# Patient Record
Sex: Male | Born: 1935 | Race: Black or African American | Hispanic: No | Smoking: Former smoker
Health system: Southern US, Community
[De-identification: ages and names within clinical notes are randomized; demographics above are authoritative.]

## PROBLEM LIST (undated history)

## (undated) DIAGNOSIS — E119 Type 2 diabetes mellitus without complications: Secondary | ICD-10-CM

## (undated) DIAGNOSIS — K7689 Other specified diseases of liver: Secondary | ICD-10-CM

## (undated) DIAGNOSIS — Z86718 Personal history of other venous thrombosis and embolism: Secondary | ICD-10-CM

## (undated) DIAGNOSIS — N184 Chronic kidney disease, stage 4 (severe): Secondary | ICD-10-CM

## (undated) DIAGNOSIS — E785 Hyperlipidemia, unspecified: Secondary | ICD-10-CM

## (undated) DIAGNOSIS — K5790 Diverticulosis of intestine, part unspecified, without perforation or abscess without bleeding: Secondary | ICD-10-CM

## (undated) DIAGNOSIS — N2889 Other specified disorders of kidney and ureter: Secondary | ICD-10-CM

## (undated) DIAGNOSIS — I81 Portal vein thrombosis: Secondary | ICD-10-CM

## (undated) DIAGNOSIS — M1990 Unspecified osteoarthritis, unspecified site: Secondary | ICD-10-CM

## (undated) DIAGNOSIS — D649 Anemia, unspecified: Secondary | ICD-10-CM

## (undated) DIAGNOSIS — H9190 Unspecified hearing loss, unspecified ear: Secondary | ICD-10-CM

## (undated) DIAGNOSIS — E039 Hypothyroidism, unspecified: Secondary | ICD-10-CM

## (undated) DIAGNOSIS — Z8711 Personal history of peptic ulcer disease: Secondary | ICD-10-CM

## (undated) DIAGNOSIS — M19019 Primary osteoarthritis, unspecified shoulder: Secondary | ICD-10-CM

## (undated) DIAGNOSIS — I10 Essential (primary) hypertension: Secondary | ICD-10-CM

## (undated) DIAGNOSIS — K5730 Diverticulosis of large intestine without perforation or abscess without bleeding: Secondary | ICD-10-CM

## (undated) HISTORY — DX: Hyperlipidemia, unspecified: E78.5

## (undated) HISTORY — DX: Portal vein thrombosis: I81

## (undated) HISTORY — DX: Other specified disorders of kidney and ureter: N28.89

## (undated) HISTORY — PX: HERNIA REPAIR: SHX51

## (undated) HISTORY — DX: Diverticulosis of intestine, part unspecified, without perforation or abscess without bleeding: K57.90

## (undated) HISTORY — PX: LIVER LOBECTOMY: SHX1976

## (undated) HISTORY — DX: Primary osteoarthritis, unspecified shoulder: M19.019

## (undated) HISTORY — PX: SHOULDER ARTHROCENTESIS: SHX1048

## (undated) HISTORY — DX: Personal history of other venous thrombosis and embolism: Z86.718

## (undated) HISTORY — DX: Essential (primary) hypertension: I10

## (undated) HISTORY — PX: COLON SURGERY: SHX602

## (undated) HISTORY — DX: Personal history of peptic ulcer disease: Z87.11

## (undated) HISTORY — DX: Diverticulosis of large intestine without perforation or abscess without bleeding: K57.30

## (undated) HISTORY — DX: Chronic kidney disease, stage 4 (severe): N18.4

## (undated) HISTORY — DX: Unspecified hearing loss, unspecified ear: H91.90

## (undated) HISTORY — DX: Other specified diseases of liver: K76.89

---

## 2009-03-08 ENCOUNTER — Encounter: Payer: Self-pay | Admitting: Family Medicine

## 2009-07-27 ENCOUNTER — Emergency Department (HOSPITAL_COMMUNITY): Admission: EM | Admit: 2009-07-27 | Discharge: 2009-07-27 | Payer: Self-pay | Admitting: Emergency Medicine

## 2009-08-02 ENCOUNTER — Emergency Department (HOSPITAL_COMMUNITY): Admission: EM | Admit: 2009-08-02 | Discharge: 2009-08-02 | Payer: Self-pay | Admitting: Emergency Medicine

## 2010-02-01 ENCOUNTER — Ambulatory Visit: Payer: Self-pay | Admitting: Family Medicine

## 2010-02-01 ENCOUNTER — Encounter: Payer: Self-pay | Admitting: Family Medicine

## 2010-02-01 DIAGNOSIS — I10 Essential (primary) hypertension: Secondary | ICD-10-CM

## 2010-02-01 DIAGNOSIS — N184 Chronic kidney disease, stage 4 (severe): Secondary | ICD-10-CM

## 2010-02-01 DIAGNOSIS — D631 Anemia in chronic kidney disease: Secondary | ICD-10-CM

## 2010-02-01 DIAGNOSIS — E1143 Type 2 diabetes mellitus with diabetic autonomic (poly)neuropathy: Secondary | ICD-10-CM

## 2010-02-01 DIAGNOSIS — N189 Chronic kidney disease, unspecified: Secondary | ICD-10-CM

## 2010-02-01 DIAGNOSIS — M19019 Primary osteoarthritis, unspecified shoulder: Secondary | ICD-10-CM

## 2010-02-01 HISTORY — DX: Primary osteoarthritis, unspecified shoulder: M19.019

## 2010-02-01 LAB — CONVERTED CEMR LAB
ALT: 29 units/L (ref 0–53)
AST: 35 units/L (ref 0–37)
Albumin: 3.9 g/dL (ref 3.5–5.2)
Alkaline Phosphatase: 71 units/L (ref 39–117)
BUN: 67 mg/dL — ABNORMAL HIGH (ref 6–23)
CO2: 27 meq/L (ref 19–32)
Calcium: 9 mg/dL (ref 8.4–10.5)
Chloride: 104 meq/L (ref 96–112)
Creatinine, Ser: 1.86 mg/dL — ABNORMAL HIGH (ref 0.40–1.50)
Glucose, Bld: 104 mg/dL — ABNORMAL HIGH (ref 70–99)
HCT: 33.8 % — ABNORMAL LOW (ref 39.0–52.0)
Hemoglobin: 10.1 g/dL — ABNORMAL LOW (ref 13.0–17.0)
MCHC: 29.9 g/dL — ABNORMAL LOW (ref 30.0–36.0)
MCV: 81.6 fL (ref 78.0–100.0)
Platelets: 199 10*3/uL (ref 150–400)
Potassium: 4.7 meq/L (ref 3.5–5.3)
RBC: 4.14 M/uL — ABNORMAL LOW (ref 4.22–5.81)
RDW: 14.9 % (ref 11.5–15.5)
Sodium: 140 meq/L (ref 135–145)
Total Bilirubin: 0.3 mg/dL (ref 0.3–1.2)
Total Protein: 7.5 g/dL (ref 6.0–8.3)
WBC: 5.8 10*3/uL (ref 4.0–10.5)

## 2010-02-05 ENCOUNTER — Telehealth: Payer: Self-pay | Admitting: Family Medicine

## 2010-02-13 ENCOUNTER — Telehealth: Payer: Self-pay | Admitting: Family Medicine

## 2010-02-19 ENCOUNTER — Encounter: Payer: Self-pay | Admitting: Family Medicine

## 2010-02-20 ENCOUNTER — Telehealth: Payer: Self-pay | Admitting: Family Medicine

## 2010-02-21 ENCOUNTER — Telehealth: Payer: Self-pay | Admitting: Family Medicine

## 2010-02-28 ENCOUNTER — Encounter: Admission: RE | Admit: 2010-02-28 | Discharge: 2010-04-05 | Payer: Self-pay | Admitting: Family Medicine

## 2010-03-06 ENCOUNTER — Ambulatory Visit: Payer: Self-pay | Admitting: Family Medicine

## 2010-03-06 ENCOUNTER — Telehealth: Payer: Self-pay | Admitting: Family Medicine

## 2010-03-08 ENCOUNTER — Encounter: Payer: Self-pay | Admitting: Family Medicine

## 2010-03-08 DIAGNOSIS — K208 Other esophagitis: Secondary | ICD-10-CM

## 2010-03-08 DIAGNOSIS — K5730 Diverticulosis of large intestine without perforation or abscess without bleeding: Secondary | ICD-10-CM

## 2010-03-08 DIAGNOSIS — Z8672 Personal history of thrombophlebitis: Secondary | ICD-10-CM

## 2010-03-08 DIAGNOSIS — N289 Disorder of kidney and ureter, unspecified: Secondary | ICD-10-CM | POA: Insufficient documentation

## 2010-03-08 DIAGNOSIS — J984 Other disorders of lung: Secondary | ICD-10-CM

## 2010-03-08 DIAGNOSIS — D376 Neoplasm of uncertain behavior of liver, gallbladder and bile ducts: Secondary | ICD-10-CM

## 2010-03-08 DIAGNOSIS — Z8711 Personal history of peptic ulcer disease: Secondary | ICD-10-CM

## 2010-03-08 HISTORY — DX: Personal history of peptic ulcer disease: Z87.11

## 2010-03-08 HISTORY — DX: Diverticulosis of large intestine without perforation or abscess without bleeding: K57.30

## 2010-03-13 ENCOUNTER — Encounter: Payer: Self-pay | Admitting: *Deleted

## 2010-03-20 ENCOUNTER — Telehealth: Payer: Self-pay | Admitting: Family Medicine

## 2010-03-28 ENCOUNTER — Ambulatory Visit (HOSPITAL_COMMUNITY): Admission: RE | Admit: 2010-03-28 | Discharge: 2010-03-28 | Payer: Self-pay | Admitting: Family Medicine

## 2010-03-28 ENCOUNTER — Telehealth: Payer: Self-pay | Admitting: Family Medicine

## 2010-04-02 ENCOUNTER — Encounter: Payer: Self-pay | Admitting: *Deleted

## 2010-04-03 ENCOUNTER — Telehealth: Payer: Self-pay | Admitting: Family Medicine

## 2010-04-16 ENCOUNTER — Ambulatory Visit: Payer: Self-pay | Admitting: Family Medicine

## 2010-04-17 ENCOUNTER — Telehealth: Payer: Self-pay | Admitting: Family Medicine

## 2010-04-24 ENCOUNTER — Encounter: Admission: RE | Admit: 2010-04-24 | Discharge: 2010-04-24 | Payer: Self-pay | Admitting: Family Medicine

## 2010-04-25 ENCOUNTER — Encounter: Payer: Self-pay | Admitting: Family Medicine

## 2010-04-26 ENCOUNTER — Encounter: Payer: Self-pay | Admitting: Family Medicine

## 2010-05-01 ENCOUNTER — Telehealth: Payer: Self-pay | Admitting: Family Medicine

## 2010-05-01 ENCOUNTER — Encounter: Payer: Self-pay | Admitting: Family Medicine

## 2010-05-04 ENCOUNTER — Encounter: Payer: Self-pay | Admitting: Cardiovascular Disease

## 2010-05-15 ENCOUNTER — Telehealth: Payer: Self-pay | Admitting: Family Medicine

## 2010-05-15 ENCOUNTER — Encounter: Payer: Self-pay | Admitting: Family Medicine

## 2010-05-17 ENCOUNTER — Encounter (HOSPITAL_COMMUNITY): Admission: RE | Admit: 2010-05-17 | Discharge: 2010-08-08 | Payer: Self-pay | Admitting: Nephrology

## 2010-05-24 ENCOUNTER — Encounter: Payer: Self-pay | Admitting: Family Medicine

## 2010-05-30 ENCOUNTER — Encounter: Payer: Self-pay | Admitting: Cardiovascular Disease

## 2010-06-06 ENCOUNTER — Telehealth: Payer: Self-pay | Admitting: Family Medicine

## 2010-06-07 ENCOUNTER — Ambulatory Visit: Payer: Self-pay | Admitting: Family Medicine

## 2010-06-07 ENCOUNTER — Encounter: Admission: RE | Admit: 2010-06-07 | Discharge: 2010-06-07 | Payer: Self-pay | Admitting: Family Medicine

## 2010-06-07 DIAGNOSIS — M109 Gout, unspecified: Secondary | ICD-10-CM | POA: Insufficient documentation

## 2010-06-08 ENCOUNTER — Telehealth (INDEPENDENT_AMBULATORY_CARE_PROVIDER_SITE_OTHER): Payer: Self-pay | Admitting: Family Medicine

## 2010-06-08 DIAGNOSIS — K921 Melena: Secondary | ICD-10-CM | POA: Insufficient documentation

## 2010-06-08 DIAGNOSIS — I81 Portal vein thrombosis: Secondary | ICD-10-CM | POA: Insufficient documentation

## 2010-06-12 ENCOUNTER — Encounter: Payer: Self-pay | Admitting: Family Medicine

## 2010-06-13 ENCOUNTER — Telehealth: Payer: Self-pay | Admitting: Family Medicine

## 2010-06-18 ENCOUNTER — Telehealth: Payer: Self-pay | Admitting: Family Medicine

## 2010-06-19 ENCOUNTER — Telehealth: Payer: Self-pay | Admitting: Family Medicine

## 2010-06-20 ENCOUNTER — Ambulatory Visit: Payer: Self-pay | Admitting: Cardiovascular Disease

## 2010-06-20 DIAGNOSIS — E782 Mixed hyperlipidemia: Secondary | ICD-10-CM

## 2010-06-21 ENCOUNTER — Telehealth (INDEPENDENT_AMBULATORY_CARE_PROVIDER_SITE_OTHER): Payer: Self-pay | Admitting: *Deleted

## 2010-06-21 ENCOUNTER — Encounter: Payer: Self-pay | Admitting: Family Medicine

## 2010-06-21 ENCOUNTER — Ambulatory Visit: Payer: Self-pay | Admitting: Family Medicine

## 2010-06-21 ENCOUNTER — Encounter: Payer: Self-pay | Admitting: *Deleted

## 2010-06-21 LAB — CONVERTED CEMR LAB: ANA Titer 1: 1:40 {titer} — ABNORMAL HIGH

## 2010-06-22 ENCOUNTER — Ambulatory Visit: Payer: Self-pay | Admitting: Oncology

## 2010-06-25 ENCOUNTER — Ambulatory Visit: Payer: Self-pay

## 2010-06-25 ENCOUNTER — Ambulatory Visit: Payer: Self-pay | Admitting: Cardiology

## 2010-06-25 ENCOUNTER — Encounter: Payer: Self-pay | Admitting: Cardiovascular Disease

## 2010-06-25 ENCOUNTER — Ambulatory Visit (HOSPITAL_COMMUNITY): Admission: RE | Admit: 2010-06-25 | Discharge: 2010-06-25 | Payer: Self-pay | Admitting: Cardiovascular Disease

## 2010-06-27 ENCOUNTER — Encounter: Payer: Self-pay | Admitting: Family Medicine

## 2010-06-28 ENCOUNTER — Encounter: Payer: Self-pay | Admitting: Family Medicine

## 2010-06-28 LAB — CBC WITH DIFFERENTIAL/PLATELET
Eosinophils Absolute: 0.2 10*3/uL (ref 0.0–0.5)
HCT: 40 % (ref 38.4–49.9)
HGB: 12.5 g/dL — ABNORMAL LOW (ref 13.0–17.1)
LYMPH%: 24.9 % (ref 14.0–49.0)
MONO#: 0.3 10*3/uL (ref 0.1–0.9)
NEUT#: 2.6 10*3/uL (ref 1.5–6.5)
Platelets: 145 10*3/uL (ref 140–400)
RBC: 4.71 10*6/uL (ref 4.20–5.82)
WBC: 4.2 10*3/uL (ref 4.0–10.3)
nRBC: 0 % (ref 0–0)

## 2010-06-29 ENCOUNTER — Telehealth: Payer: Self-pay | Admitting: Cardiovascular Disease

## 2010-07-03 ENCOUNTER — Telehealth: Payer: Self-pay | Admitting: Family Medicine

## 2010-07-04 ENCOUNTER — Telehealth: Payer: Self-pay | Admitting: Family Medicine

## 2010-07-05 LAB — HYPERCOAGULABLE PANEL, COMPREHENSIVE
AntiThromb III Func: 113 % (ref 76–126)
Beta-2 Glyco I IgG: 3 G Units (ref <20)
Beta-2-Glycoprotein I IgA: 1 A Units (ref <20)
Beta-2-Glycoprotein I IgM: 1 M Units (ref <20)
Homocysteine: 10.8 umol/L (ref 4.0–15.4)
Lupus Anticoagulant: NOT DETECTED
Protein S Activity: 48 % — ABNORMAL LOW (ref 69–129)
Protein S Ag, Total: 83 % (ref 70–140)

## 2010-07-05 LAB — COMPREHENSIVE METABOLIC PANEL
ALT: 38 U/L (ref 0–53)
CO2: 20 mEq/L (ref 19–32)
Calcium: 9.4 mg/dL (ref 8.4–10.5)
Chloride: 108 mEq/L (ref 96–112)
Creatinine, Ser: 1.19 mg/dL (ref 0.40–1.50)
Total Protein: 7.5 g/dL (ref 6.0–8.3)

## 2010-07-06 ENCOUNTER — Telehealth: Payer: Self-pay | Admitting: Family Medicine

## 2010-07-06 ENCOUNTER — Encounter: Payer: Self-pay | Admitting: Family Medicine

## 2010-07-06 ENCOUNTER — Telehealth: Payer: Self-pay | Admitting: Cardiovascular Disease

## 2010-07-10 ENCOUNTER — Telehealth: Payer: Self-pay | Admitting: Family Medicine

## 2010-07-11 DIAGNOSIS — K2270 Barrett's esophagus without dysplasia: Secondary | ICD-10-CM

## 2010-07-12 ENCOUNTER — Encounter: Payer: Self-pay | Admitting: Family Medicine

## 2010-07-13 ENCOUNTER — Encounter: Payer: Self-pay | Admitting: Family Medicine

## 2010-07-13 ENCOUNTER — Ambulatory Visit: Payer: Self-pay | Admitting: Family Medicine

## 2010-07-17 ENCOUNTER — Telehealth: Payer: Self-pay | Admitting: Family Medicine

## 2010-07-19 ENCOUNTER — Ambulatory Visit: Payer: Self-pay | Admitting: Family Medicine

## 2010-07-19 DIAGNOSIS — R799 Abnormal finding of blood chemistry, unspecified: Secondary | ICD-10-CM

## 2010-07-24 ENCOUNTER — Encounter: Payer: Self-pay | Admitting: Family Medicine

## 2010-07-24 ENCOUNTER — Ambulatory Visit: Payer: Self-pay | Admitting: Oncology

## 2010-07-26 ENCOUNTER — Telehealth: Payer: Self-pay | Admitting: Family Medicine

## 2010-07-31 ENCOUNTER — Telehealth: Payer: Self-pay | Admitting: Family Medicine

## 2010-08-03 ENCOUNTER — Encounter: Payer: Self-pay | Admitting: Family Medicine

## 2010-08-14 ENCOUNTER — Telehealth: Payer: Self-pay | Admitting: Family Medicine

## 2010-08-27 ENCOUNTER — Encounter: Payer: Self-pay | Admitting: Family Medicine

## 2010-08-28 ENCOUNTER — Telehealth: Payer: Self-pay | Admitting: Family Medicine

## 2010-08-28 ENCOUNTER — Encounter: Payer: Self-pay | Admitting: Family Medicine

## 2010-09-04 ENCOUNTER — Telehealth: Payer: Self-pay | Admitting: Family Medicine

## 2010-09-11 ENCOUNTER — Telehealth: Payer: Self-pay | Admitting: Family Medicine

## 2010-09-25 ENCOUNTER — Telehealth: Payer: Self-pay | Admitting: Family Medicine

## 2010-10-02 ENCOUNTER — Telehealth: Payer: Self-pay | Admitting: Family Medicine

## 2010-10-09 ENCOUNTER — Telehealth: Payer: Self-pay | Admitting: *Deleted

## 2010-10-15 ENCOUNTER — Ambulatory Visit: Payer: Self-pay | Admitting: Family Medicine

## 2010-10-22 ENCOUNTER — Telehealth: Payer: Self-pay | Admitting: Family Medicine

## 2010-10-23 ENCOUNTER — Telehealth: Payer: Self-pay | Admitting: Family Medicine

## 2010-10-30 ENCOUNTER — Telehealth: Payer: Self-pay | Admitting: Family Medicine

## 2010-10-31 ENCOUNTER — Encounter: Payer: Self-pay | Admitting: Family Medicine

## 2010-11-06 ENCOUNTER — Telehealth: Payer: Self-pay | Admitting: Family Medicine

## 2010-11-07 ENCOUNTER — Ambulatory Visit: Payer: Self-pay | Admitting: Family Medicine

## 2010-11-12 ENCOUNTER — Encounter: Payer: Self-pay | Admitting: Family Medicine

## 2010-11-12 ENCOUNTER — Ambulatory Visit: Payer: Self-pay | Admitting: Family Medicine

## 2010-11-13 ENCOUNTER — Telehealth: Payer: Self-pay | Admitting: *Deleted

## 2010-11-14 ENCOUNTER — Ambulatory Visit: Payer: Self-pay | Admitting: Family Medicine

## 2010-11-20 ENCOUNTER — Encounter: Payer: Self-pay | Admitting: Family Medicine

## 2010-11-20 ENCOUNTER — Telehealth: Payer: Self-pay | Admitting: *Deleted

## 2010-11-27 ENCOUNTER — Encounter: Payer: Self-pay | Admitting: Family Medicine

## 2010-12-04 ENCOUNTER — Encounter: Payer: Self-pay | Admitting: Family Medicine

## 2010-12-10 ENCOUNTER — Telehealth: Payer: Self-pay | Admitting: Family Medicine

## 2010-12-10 ENCOUNTER — Encounter: Payer: Self-pay | Admitting: Family Medicine

## 2010-12-11 ENCOUNTER — Ambulatory Visit: Payer: Self-pay | Admitting: Family Medicine

## 2010-12-11 LAB — CONVERTED CEMR LAB: INR: 4

## 2010-12-12 ENCOUNTER — Telehealth: Payer: Self-pay | Admitting: *Deleted

## 2010-12-13 ENCOUNTER — Encounter (HOSPITAL_COMMUNITY)
Admission: RE | Admit: 2010-12-13 | Discharge: 2011-01-29 | Payer: Self-pay | Source: Home / Self Care | Attending: Nephrology | Admitting: Nephrology

## 2010-12-18 ENCOUNTER — Telehealth: Payer: Self-pay | Admitting: *Deleted

## 2010-12-18 ENCOUNTER — Encounter
Admission: RE | Admit: 2010-12-18 | Discharge: 2011-01-29 | Payer: Self-pay | Source: Home / Self Care | Attending: Family Medicine | Admitting: Family Medicine

## 2010-12-25 ENCOUNTER — Telehealth: Payer: Self-pay | Admitting: Family Medicine

## 2011-01-01 ENCOUNTER — Telehealth: Payer: Self-pay | Admitting: Family Medicine

## 2011-01-08 ENCOUNTER — Encounter: Payer: Self-pay | Admitting: Family Medicine

## 2011-01-09 ENCOUNTER — Encounter: Payer: Self-pay | Admitting: Family Medicine

## 2011-01-09 ENCOUNTER — Ambulatory Visit: Payer: Self-pay | Admitting: Oncology

## 2011-01-10 ENCOUNTER — Encounter: Payer: Self-pay | Admitting: Family Medicine

## 2011-01-15 ENCOUNTER — Telehealth: Payer: Self-pay | Admitting: Family Medicine

## 2011-01-18 ENCOUNTER — Telehealth: Payer: Self-pay | Admitting: Family Medicine

## 2011-01-18 ENCOUNTER — Encounter: Payer: Self-pay | Admitting: Family Medicine

## 2011-01-18 ENCOUNTER — Ambulatory Visit: Admission: RE | Admit: 2011-01-18 | Discharge: 2011-01-18 | Payer: Self-pay | Source: Home / Self Care

## 2011-01-18 DIAGNOSIS — K5900 Constipation, unspecified: Secondary | ICD-10-CM | POA: Insufficient documentation

## 2011-01-22 ENCOUNTER — Encounter: Payer: Self-pay | Admitting: Family Medicine

## 2011-01-22 ENCOUNTER — Encounter (INDEPENDENT_AMBULATORY_CARE_PROVIDER_SITE_OTHER): Payer: Self-pay | Admitting: *Deleted

## 2011-01-22 LAB — CONVERTED CEMR LAB
ALT: 84 units/L — ABNORMAL HIGH (ref 0–53)
Albumin: 3.6 g/dL (ref 3.5–5.2)
CO2: 25 meq/L (ref 19–32)
Calcium: 9.4 mg/dL (ref 8.4–10.5)
Chloride: 101 meq/L (ref 96–112)
Creatinine, Ser: 1.28 mg/dL (ref 0.40–1.50)
Direct LDL: 41 mg/dL
Potassium: 6 meq/L — ABNORMAL HIGH (ref 3.5–5.3)
Total Protein: 6.4 g/dL (ref 6.0–8.3)

## 2011-01-29 ENCOUNTER — Telehealth: Payer: Self-pay | Admitting: Family Medicine

## 2011-01-29 NOTE — Progress Notes (Signed)
Summary: phn msg  Phone Note Other Incoming Call back at [phone]   Caller: [facility] Summary of Call: Did Dr. Katrinka Blazing get [facility] message last Thurs about his INR?  Sees him again in 2 weeks.  Let her know if INR needs checking sooner. Initial call taken by: Clydell Hakim,  July 31, 2010 8:55 AM  Follow-up for Phone Call        called told her that would be fine to check it again in 2 weeks.  Follow-up by: Antoine Primas DO,  July 31, 2010 3:16 PM

## 2011-01-29 NOTE — Assessment & Plan Note (Signed)
Summary: diabetes follow up ,? pneumonia vaccine/ ls   Vital Signs:  Patient profile:   77 year old male Height:      64.5 inches Weight:      146.4 pounds BMI:     24.83 Temp:     97.5 degrees F oral Pulse rate:   57 / minute BP sitting:   140 / 88  (left arm) Cuff size:   regular  Vitals Entered By: Garen Grams LPN (November 07, 2010 3:50 PM) CC: f/u dm Is Patient Diabetic? Yes Did you bring your meter with you today? No Pain Assessment Patient in pain? no        Primary Care Tomeeka Plaugher:  Antoine Primas DO  CC:  f/u dm.  History of Present Illness: 77 yo male with multiple medical problems and sees many specialists   1. glucose low:71 Glucose high:181 Last A1C: 7.6 Taking Meds:yes On INsulin:no Side effects:no  ROS: Denies polyuria, polydipsia, visual changes numbness in extremities, foot ulcers Lifestyle modifications:no  *pt though recently needed to have prednisone for his gout and is on 5mg  daily at this time. When taking high doses did have some    2.  BP today: 140/88 Taking Meds:yes had amlodipine added by nephrologist.  Side Effects:no ROS: denies Headache visual changes nausea vomiting abdominal pain numbness in extremities    Gout: Pt is on prednisone, on allopurinol but due to CKD not comfortable.  cannot afford uloric. Rheumatologist wants to manage.   Habits & Providers  Alcohol-Tobacco-Diet     Tobacco Status: never  Current Medications (verified): 1)  Aspir-Low 81 Mg Tbec (Aspirin) .Marland Kitchen.. 1 Tab By Mouth Once Daily 2)  Toprol Xl 100 Mg Xr24h-Tab (Metoprolol Succinate) .Marland Kitchen.. 1 Tab By Mouth Once Daily 3)  Glucotrol Xl 5 Mg Xr24h-Tab (Glipizide) .Marland Kitchen.. 1 Tab By Mouth Once Daily 4)  Coumadin 5 Mg Tabs (Warfarin Sodium) .... As Directed 5)  Pravastatin Sodium 40 Mg Tabs (Pravastatin Sodium) .Marland Kitchen.. 1 Once Daily 6)  Ferro-Bob 325 (65 Fe) Mg Tabs (Ferrous Sulfate) .Marland Kitchen.. 1 Tab By Mouth Once Daily 7)  Colace 100 Mg Caps (Docusate Sodium) .Marland Kitchen.. 1 Tab By Mouth Two  Times A Day 8)  Januvia 50 Mg Tabs (Sitagliptin Phosphate) .... Once Daily 9)  Calcium-Vitamin D 500-125 Mg-Unit Tabs (Calcium-Vitamin D) .Marland Kitchen.. 1 Two Times A Day 10)  Furosemide 40 Mg Tabs (Furosemide) .... One Pill Daily 11)  Vitamin B-12 100 Mcg Tabs (Cyanocobalamin) .Marland Kitchen.. 1 Tab Daily 12)  Folic Acid 1 Mg Tabs (Folic Acid) .Marland Kitchen.. 1 Tab Daily 13)  Theragran-M  Tabs (Multiple Vitamins-Minerals) .Marland Kitchen.. 1 Tab Daily 14)  Prodigy Blood Glucose Test  Strp (Glucose Blood) .... Use As Directed 15)  Prodigy Twist Top Lancets 28g  Misc (Lancets) .... Use As Directed 16)  Bd Disp Needles 25g X 5/8" Misc (Needle (Disp)) .... Use As Directed 17)  Omeprazole 20 Mg Cpdr (Omeprazole) .... Take 1 Tab Daily 18)  Calcitriol 0.25 Mcg Caps (Calcitriol) .... Take 1 Tab By Mouth Moday, Wed, Friday. 19)  Catapres-Tts-2 0.2 Mg/24hr Ptwk (Clonidine Hcl) .... Place On Skin and Change Weekly 20)  Amlodipine Besylate 5 Mg Tabs (Amlodipine Besylate) .Marland Kitchen.. 1 Tab Daily 21)  Sodium Bicarbonate 648 Mg Tabs (Sodium Bicarbonate) 22)  Allopurinol 100 Mg Tabs (Allopurinol) 23)  Prednisone 5 Mg Tabs (Prednisone) .... Daily  Allergies (verified): No Known Drug Allergies  Past History:  Past medical, surgical, family and social histories (including risk factors) reviewed, and no changes noted (except  as noted below).  Past Medical History: Reviewed history from 05/30/2010 and no changes required. HTN, T2DM, anemia, ckd, Pt is deaf and talks with translator, ? hx of lupus still need results from [facility] ? of ulcers Lung nodule stable Liver nodle stable- likely should f/u periodically DIVERTICULOSIS OF COLON Hx of OTHER ESOPHAGITIS  UNSPECIFIED DISORDER OF KIDNEY AND URETER PERSONAL HISTORY OF THROMBOPHLEBITIS  SLE CHRONIC KIDNEY DISEASE UNSPECIFIED  DIABETES MELLITUS, TYPE II  ARTHRITIS, LEFT SHOULDER   Hyperkalemia   Past Surgical History: Reviewed history from 02/01/2010 and no changes required. Right shoulder  surgery  remote hx Pt has abdominal scar, likely hernia repair Flu shot in 10/10 Colonoscopy in 2010  Family History: Reviewed history from 02/01/2010 and no changes required. Heart dx in mother DM in both parents Prostate cancer in father  Social History: Reviewed history from 07/13/2010 and no changes required. 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    Review of Systems       deneis fever, chills, nausea, vomiting, diarrhea or constipation   Physical Exam  General:  Well-developed,well-nourished,in no acute distress; alert,appropriate and cooperative throughout examination Eyes:  PERRLA, EOMI, some catarct seen b/l Mouth:  MMM, uvula midline Lungs:  Normal respiratory effort, chest expands symmetrically. Lungs are clear to auscultation, no crackles or wheezes. Heart:  RRR 2/6 SEM stable Abdomen:  BS +, NT,, ND Msk:  shoulder still have limited range of motion on left, still causes pain.  Pulses:  2+ Extremities:  left MTP joint is tender, no erythema or swellin trace edema. .  Neurologic:  CN 2-12 intact Skin:  no rash or lesions   Impression & Recommendations:  Problem # 1:  DIABETES MELLITUS, TYPE II (ICD-250.00) Worsen A1C, but was on hgih dose prednisone for about 1 month.  Will not make changes right now due to pt having some low readings.  and today doing much better and recent log is in the 100's.  will see again in 2 months get recheck A1c at that time then will consider options.  His updated medication list for this problem includes:    Aspir-low 81 Mg Tbec (Aspirin) .Marland Kitchen... 1 tab by mouth once daily    Glucotrol Xl 5 Mg Xr24h-tab (Glipizide) .Marland Kitchen... 1 tab by mouth once daily    Januvia 50 Mg Tabs (Sitagliptin phosphate) ..... Once daily  Orders: A1C-[facility] (16109) [facility]- Est  Level 4 (60454)  Labs Reviewed: Creat: 1.86 (02/01/2010)    Reviewed  HgBA1c results: 10.2 (11/07/2010)  7.6 (06/07/2010)  Problem # 2:  ESSENTIAL HYPERTENSION, BENIGN (ICD-401.1) near goal will contineu current regimen with amlodipine warn of looking for edema in LE.  His updated medication list for this problem includes:    Toprol Xl 100 Mg Xr24h-tab (Metoprolol succinate) .Marland Kitchen... 1 tab by mouth once daily    Furosemide 40 Mg Tabs (Furosemide) ..... One pill daily    Catapres-tts-2 0.2 Mg/24hr Ptwk (Clonidine hcl) .Marland Kitchen... Place on skin and change weekly    Amlodipine Besylate 5 Mg Tabs (Amlodipine besylate) .Marland Kitchen... 1 tab daily  Orders: [facility]- Est  Level 4 (09811)  Problem # 3:  GOUT, UNSPECIFIED (ICD-274.9) will allow rheum to managed but would consider getting prior approval for uloric.  stop allopurinol Orders: [facility]- Est  Level 4 (91478)  His updated medication list for this problem includes:    Allopurinol 100  Mg Tabs (Allopurinol)  Complete Medication List: 1)  Aspir-low 81 Mg Tbec (Aspirin) .Marland Kitchen.. 1 tab by mouth once daily 2)  Toprol Xl 100 Mg Xr24h-tab (Metoprolol succinate) .Marland Kitchen.. 1 tab by mouth once daily 3)  Glucotrol Xl 5 Mg Xr24h-tab (Glipizide) .Marland Kitchen.. 1 tab by mouth once daily 4)  Coumadin 5 Mg Tabs (Warfarin sodium) .... As directed 5)  Pravastatin Sodium 40 Mg Tabs (Pravastatin sodium) .Marland Kitchen.. 1 once daily 6)  Ferro-bob 325 (65 Fe) Mg Tabs (Ferrous sulfate) .Marland Kitchen.. 1 tab by mouth once daily 7)  Colace 100 Mg Caps (Docusate sodium) .Marland Kitchen.. 1 tab by mouth two times a day 8)  Januvia 50 Mg Tabs (Sitagliptin phosphate) .... Once daily 9)  Calcium-vitamin D 500-125 Mg-unit Tabs (Calcium-vitamin d) .Marland Kitchen.. 1 two times a day 10)  Furosemide 40 Mg Tabs (Furosemide) .... One pill daily 11)  Vitamin B-12 100 Mcg Tabs (Cyanocobalamin) .Marland Kitchen.. 1 tab daily 12)  Folic Acid 1 Mg Tabs (Folic acid) .Marland Kitchen.. 1 tab daily 13)  Theragran-m Tabs (Multiple vitamins-minerals) .Marland Kitchen.. 1 tab daily 14)  Prodigy Blood Glucose Test Strp (Glucose blood) .... Use as directed 15)  Prodigy Twist Top  Lancets 28g Misc (Lancets) .... Use as directed 16)  Bd Disp Needles 25g X 5/8" Misc (Needle (disp)) .... Use as directed 17)  Omeprazole 20 Mg Cpdr (Omeprazole) .... Take 1 tab daily 18)  Calcitriol 0.25 Mcg Caps (Calcitriol) .... Take 1 tab by mouth moday, wed, friday. 19)  Catapres-tts-2 0.2 Mg/24hr Ptwk (Clonidine hcl) .... Place on skin and change weekly 20)  Amlodipine Besylate 5 Mg Tabs (Amlodipine besylate) .Marland Kitchen.. 1 tab daily 21)  Sodium Bicarbonate 648 Mg Tabs (Sodium bicarbonate) 22)  Allopurinol 100 Mg Tabs (Allopurinol) 23)  Prednisone 5 Mg Tabs (Prednisone) .... Daily  Patient Instructions: 1)  good to see you  2)  I want you to come back in 2 months.  3)  If your blood sugars become very high (greater than 300 regularly) please call and I will change his regimen.  Prescriptions: AMLODIPINE BESYLATE 5 MG TABS (AMLODIPINE BESYLATE) 1 tab daily  #90 x 1   Entered and Authorized by:   Antoine Primas DO   Signed by:   Antoine Primas DO on 11/07/2010   Method used:   Electronically to        [id number] (retail)       [address]       [place], [place]  [id number]       Ph: [phone]       Fax: [phone]   RxID:   [id number]    Orders Added: 1)  A1C-[facility] [83036] 2)  [facility]- Est  Level 4 [22025]    Laboratory Results   Blood Tests   Date/Time Received: November 07, 2010 3:44 PM  Date/Time Reported: November 07, 2010 4:03 PM   HGBA1C: 10.2%   (Normal Range: Non-Diabetic - 3-6%   Control Diabetic - 6-8%)  Comments: ...........test performed by...........Marland KitchenTerese Door, CMA

## 2011-01-29 NOTE — Progress Notes (Signed)
Summary: phn msg  Phone Note From Other Clinic Call back at [phone]   Caller: [facility] Summary of Call: inr- 1.5 pt - 15.2 missed 1 tab last week & had been taken off the week prior she sees him again next Tues if doc wants it checked again BP- 156/70 resting  has appt 07/19/10 @ 3:45  Initial call taken by: De Nurse,  July 10, 2010 8:59 AM  Follow-up for Phone Call        f/u next week, blood pressure looks decent no changes but probably needs to be seen.  Follow-up by: Antoine Primas DO,  July 10, 2010 12:14 PM

## 2011-01-29 NOTE — Assessment & Plan Note (Signed)
Summary: NP,tcb   Vital Signs:  Patient profile:   77 year old male Height:      64.5 inches Weight:      140.5 pounds BMI:     23.83 Temp:     97.4 degrees F oral Pulse rate:   58 / minute BP sitting:   121 / 79  (left arm) Cuff size:   regular  Vitals Entered By: Garen Grams LPN (February 01, 2010 3:12 PM) CC: New Patient Pain Assessment Patient in pain? yes     Location: left shoulder   CC:  New Patient.  History of Present Illness: This pt is here to establish care Pt is a 77 yo M with Pmhx of deafness since birth DM, HTN, anemia, CKD, and some reason is on plaquenil and coumadin here with main complaint of left shoulder pain  1. Shoulder pain-  left side seems to ba worse with movement which makes signing difficult, pain is throbbing through the day and can have shooting pain down arm sometimes. Pt denies any numbeness or muscle weakness, still able to grab.  Pt also states has a grinding sensation.  Pt had surgery on right shoulder with marked improvement but PT was grusome and would like to avoid it if possible.   2. DM- Pt showed me his values, usually between 79-150 with most values less than 130.  Denies any shakiness diaphoresis or n/v.  Pt understands what to do if sugar is too low or too high.  Pt is on glucotrol XL 5mg .  Pt is taking an asa daily   3.CKD-  Pt has been seeing a nephrologist over in [facility] but would like to change to someone in the [place] area.  Pt states no talk of dialysis in the near future but does need to watch it somewhat.  Pt takes 650mg  of bicarb daily    4.  HTN- 121/79  Pt has been on metoprolol 100mg  Qd, Clonidine .1mg  patch daily  5.  Pt is also taking coumadin and plaquenil but does not know why.  Pt and daughter staes this would be best addressed with home nurse and previous records from previous Dr.    Montez Hageman & Providers  Alcohol-Tobacco-Diet     Tobacco Status: never  Current Medications (verified): 1)  Aspir-Low 81  Mg Tbec (Aspirin) .Marland Kitchen.. 1 Tab By Mouth Once Daily 2)  Toprol Xl 100 Mg Xr24h-Tab (Metoprolol Succinate) .Marland Kitchen.. 1 Tab By Mouth Once Daily 3)  Glucotrol Xl 5 Mg Xr24h-Tab (Glipizide) .Marland Kitchen.. 1 Tab By Mouth Once Daily 4)  Coumadin 5 Mg Tabs (Warfarin Sodium) .... As Directed 5)  Zocor 80 Mg Tabs (Simvastatin) .Marland Kitchen.. 1 Tab By Mouth At Bedtime 6)  Sodium Bicarbonate 648 Mg Tabs (Sodium Bicarbonate) .Marland Kitchen.. 1 Tab By Mouth Once Daily 7)  Catapres-Tts-1 0.1 Mg/24hr Ptwk (Clonidine Hcl) .Marland Kitchen.. 1 Patch Daily 8)  Ferro-Bob 325 (65 Fe) Mg Tabs (Ferrous Sulfate) .Marland Kitchen.. 1 Tab By Mouth Once Daily 9)  Plaquenil 200 Mg Tabs (Hydroxychloroquine Sulfate) .Marland Kitchen.. 1 Tab By Mouth Once Daily 10)  Colace 100 Mg Caps (Docusate Sodium) .Marland Kitchen.. 1 Tab By Mouth Two Times A Day  Allergies (verified): No Known Drug Allergies  Past History:  Past Medical History: HTN, T2DM, anemia, ckd, Pt is deaf and talks with translator ? of ulcers ? of liver problems- (mamlaria?  will update later)  Past Surgical History: Right shoulder surgery  remote hx Pt has abdominal scar, likely hernia repair Flu shot in 10/10  Colonoscopy in 2010   Family History: Heart dx in mother DM in both parents Prostate cancer in father  Social History: Lives with Daughter [name]), who is also his POA no smoke, no drink, no illicits Like to go to [employer] homes and do activities with peers.   Smoking Status:  never  Review of Systems       denies fever, chills, nausea, vomiting, diarrhea or constipation   Physical Exam  General:  vitals reviewed appropriate for age NAD pleasant  Eyes:  PERRLA, EOMI,  Mouth:  MMM, uvula midline Neck:  full ROM, no LAD Lungs:  Normal respiratory effort, chest expands symmetrically. Lungs are clear to auscultation, no crackles or wheezes. Heart:  RRR 2/6 SEM Abdomen:  abdominal scar from previous surgery.  BS + , NT, ND Extremities:  Left shoulder, decrease ROM in all planes worse with internal rotation, could not  bring arm to 90 flexion or extention more like 40 degrees.  + crepitus, no radiation of pain stay localized on joint space.   Right shoulder incision from previous surgery.  Better ROM but still not full minimal crepitus LE no edema Neurologic:  2-12 intact   Impression & Recommendations:  Problem # 1:  ESSENTIAL HYPERTENSION, BENIGN (ICD-401.1)  Orders: Comp Met-[facility] (16010-93235) [facility]- Est  Level 4 (57322)  His updated medication list for this problem includes:    Toprol Xl 100 Mg Xr24h-tab (Metoprolol succinate) .Marland Kitchen... 1 tab by mouth once daily    Catapres-tts-1 0.1 Mg/24hr Ptwk (Clonidine hcl) .Marland Kitchen... 1 patch daily  Problem # 2:  ARTHRITIS, LEFT SHOULDER (ICD-716.91) Had injection today with marked improvement.  Pt told of warning signs that this may affect his blood sugars.  Pt told this should last a while and hopefully will give enough mobility to start physical therapy.   Orders: Physical Therapy Referral (PT) [facility]- Est  Level 4 (02542) Injection, intermediate joint - [facility] (70623)  Problem # 3:  DIABETES MELLITUS, TYPE II (ICD-250.00) Need to check A1C at next appointment  His updated medication list for this problem includes:    Aspir-low 81 Mg Tbec (Aspirin) .Marland Kitchen... 1 tab by mouth once daily    Glucotrol Xl 5 Mg Xr24h-tab (Glipizide) .Marland Kitchen... 1 tab by mouth once daily  Orders: [facility]- Est  Level 4 (99214)  Problem # 4:  ANEMIA IN CHRONIC KIDNEY DISEASE (ICD-285.21) will get CBC and BMET today His updated medication list for this problem includes:    Ferro-bob 325 (65 Fe) Mg Tabs (Ferrous sulfate) .Marland Kitchen... 1 tab by mouth once daily  Orders: CBC-[facility] (76283) [facility]- Est  Level 4 (15176)  Problem # 5:  CHRONIC KIDNEY DISEASE UNSPECIFIED (ICD-585.9) BMEt today, will get refferal for nephrologist if as needed  Orders: [facility]- Est  Level 4 (99214)  Complete Medication List: 1)  Aspir-low 81 Mg Tbec (Aspirin) .Marland Kitchen.. 1 tab by mouth once daily 2)  Toprol Xl 100 Mg Xr24h-tab (Metoprolol  succinate) .Marland Kitchen.. 1 tab by mouth once daily 3)  Glucotrol Xl 5 Mg Xr24h-tab (Glipizide) .Marland Kitchen.. 1 tab by mouth once daily 4)  Coumadin 5 Mg Tabs (Warfarin sodium) .... As directed 5)  Zocor 80 Mg Tabs (Simvastatin) .Marland Kitchen.. 1 tab by mouth at bedtime 6)  Sodium Bicarbonate 648 Mg Tabs (Sodium bicarbonate) .Marland Kitchen.. 1 tab by mouth once daily 7)  Catapres-tts-1 0.1 Mg/24hr Ptwk (Clonidine hcl) .Marland Kitchen.. 1 patch daily 8)  Ferro-bob 325 (65 Fe) Mg Tabs (Ferrous sulfate) .Marland Kitchen.. 1 tab by mouth once daily 9)  Plaquenil 200  Mg Tabs (Hydroxychloroquine sulfate) .Marland Kitchen.. 1 tab by mouth once daily 10)  Colace 100 Mg Caps (Docusate sodium) .Marland Kitchen.. 1 tab by mouth two times a day   Prevention & Chronic Care Immunizations   Influenza vaccine: Not documented   Influenza vaccine due: 08/30/2010    Tetanus booster: Not documented    Pneumococcal vaccine: Not documented    H. zoster vaccine: Not documented  Colorectal Screening   Hemoccult: Not documented    Colonoscopy: Not documented   Colonoscopy due: 09/29/2014  Other Screening   PSA: Not documented   PSA action/deferral: Discussed-decision deferred  (02/01/2010)   Smoking status: never  (02/01/2010)  Diabetes Mellitus   HgbA1C: Not documented    Eye exam: Not documented    Foot exam: Not documented   High risk foot: Not documented   Foot care education: Not documented    Urine microalbumin/creatinine ratio: Not documented  Lipids   Total Cholesterol: Not documented   LDL: Not documented   LDL Direct: Not documented   HDL: Not documented   Triglycerides: Not documented  Hypertension   Last Blood Pressure: 121 / 79  (02/01/2010)   Serum creatinine: Not documented   Serum potassium Not documented CMP ordered     Hypertension flowsheet reviewed?: Yes   Progress toward BP goal: At goal    Stage of readiness to change (hypertension management): Maintenance  Self-Management Support :    Diabetes self-management support: Not documented     Hypertension self-management support: Not documented

## 2011-01-29 NOTE — Miscellaneous (Signed)
Summary: forms to sign  Clinical Lists Changes rec'd forms via fax asking for a signature to allow pt to have a TENS unit. placed in your chart box.Golden Circle RN  May 01, 2010 2:22 PM Signed and axed bac 05/03/10.

## 2011-01-29 NOTE — Progress Notes (Signed)
Summary: phn msg  Phone Note From Other Clinic Call back at [phone]   [facility]: INR- 1.6 & 15.7 held dose completely last Tues - taking 5mg  per day since. still taking prednisone pls let her know if this needs to be changed Initial call taken by: De Nurse,  September 04, 2010 9:25 AM  Follow-up for Phone Call        called Tresa Endo, told her to go back to regular schedule coumadin with 7.5 mg two times a week and 5mg  every other day.  Follow-up by: Antoine Primas DO,  September 05, 2010 9:16 AM     Appended Document: phn msg has a few more questions- sched to see him next week and check INR or wait until the week of the 27th - if OK with that. Came home yesterday morning - covered with red marks w/ blisters all over him.  family thinks he had bed bugs- anything can he take for itching and rash? pls call Tresa Endo with these [phone]  Appended Document: phn msg called kelly back told to take benedryl cream and zyrtec at night.  If not better than can add benedryl 25mg  Q 6hr as needed but would attempt to avoid.  If they get worse he needs to be seen.

## 2011-01-29 NOTE — Progress Notes (Signed)
Summary: phn msg  Phone Note From Other Clinic Call back at [phone]   [facility]: PT/INR - 1.9  needs to talk to nurse about some appts she needs for this pt Initial call taken by: De Nurse,  February 20, 2010 9:06 AM  Follow-up for Phone Call        LMOVM for kelly to callback Follow-up by: Jone Baseman CMA,  February 20, 2010 10:26 AM  Additional Follow-up for Phone Call Additional follow up Details #1::        Has Mr. Mcmanaway on 7.5mg  S/M/W/F/S and 5mg  T/TH.  Wants Md to know she will probably go back out in 2 weeks unless he wants sooner.  Also daughter would like to have a urology referral.  Nolon Rod that MD may wnat to discuss this at next appt.  Tresa Endo will [employer].  Will forward note to MD for completion  Sounds good, will address at next appointment Additional Follow-up by: Jone Baseman CMA,  February 20, 2010 11:11 AM

## 2011-01-29 NOTE — Consult Note (Signed)
Summary: [facility] Hem/Onc- continue warfarin   [facility] Hem/Onc   Imported By: De Nurse 08/08/2010 15:31:06  _____________________________________________________________________  External Attachment:    Type:   Image     Comment:   External Document

## 2011-01-29 NOTE — Miscellaneous (Signed)
Summary: elevated Blood sugars  Clinical Lists Changes patient and daughter came in today requsting lab check his glucose. they had been told by MD to have lab check BS against patient's home monitor..  his home monitor read 450 and the lab monitor read 468. Daughter is concerned that  prednisone may be a cause. apparently Dr. Katrinka Blazing and kidney  doctor want him off prednisone but rheumatlogist wants him to take along with allopurinal for gout management. his insurance denied Uloric  and this combination is what rheumatologist has prescribed. . paged Dr. Katrinka Blazing and he advises for patient to stop prednisone now. keep a food diary and follow up with him soon. appointment is scheduled for 11/14/2010. call sooner if nausea or signs of lethargy. communiated with daughter by writing and she  appears to understand instructions.   will try to start process for PA for uloric. Theresia Lo RN  November 12, 2010 5:58 PM  called patient drug covrage plan at [phone] ([employer]) and was told  that  Uloric can be prescribed under patient's drug plan without rejection . will notify MD. Theresia Lo RN  November 13, 2010 5:16 PM

## 2011-01-29 NOTE — Progress Notes (Signed)
Summary: Lab Res  Phone Note Other Incoming Call back at [phone]   Caller: Patient Caller: [facility] Summary of Call: INR 2.8 PT 20.2   He is also on new meds eye doctor ordered him to take 2000mg  a day 1 tab twice daily of fish oil.  He started them last Thursday evening. Also they want to do cataract surgery and would like for him to hold aspirin for 1 week and coumidin 3 days prior to surgery. Initial call taken by: Clydell Hakim,  September 11, 2010 8:39 AM  Follow-up for Phone Call        called kelly, told her to continue fish oil and would be fine to hold asa and coumidn for his cataract surgery.  Told to recheck INR in 2 weeks. Same dose.  Follow-up by: Antoine Primas DO,  September 11, 2010 1:28 PM

## 2011-01-29 NOTE — Assessment & Plan Note (Signed)
Summary: NP3/SURGICAL CLEARENCE/DM   Primary Provider:  Antoine Primas DO  CC:  surgerical clearnce shoulder sugery.  History of Present Illness: Philip Matthews is seen at the request of Dr Katrinka Blazing and Ave Filter for preop orthopedic surgical clearance.  He is deaf and an interpreter was used througout the interview.  He injured his left shoulder over a year ago and needs rotator cuff type surgery to regain mobility.  He has no documented CAD.  He has not had any recent surgery.  He is on coumadin but the indication is not clear.  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  He has type 2 DM, hypertensio and elevated lipids.  A lot of his medical care had been done in [place] before and this is where the coumadin was started.  There is a ? of connective tissue disease or lupus.  He walks but is otherwise sedentary.  He denies SSCP, palpitations, dyspnea, edema or syncope.  Given his multiple risk factors and DM it is reasonable to risk stratify with stress test.  Since he is relatively bradycardic and cant lift his left arm over his head I think a dobutamine echo would be the best approach.  I encouraged his daughter to speak with his primary about indications for coumadin and the fact that it would have to be stopped 5 days before surgery.  The more important question is should he be on it at all.   Current Problems (verified): 1)  Pre-operative Cardiovascular Examination  (ICD-V72.81) 2)  Hx of Portal Vein Thrombosis  (ICD-452) 3)  Hemoccult Positive Stool  (ICD-578.1) 4)  Gout, Unspecified  (ICD-274.9) 5)  Liver Mass  (ICD-235.3) 6)  Lung Nodule  (ICD-518.89) 7)  Diverticulosis of Colon  (ICD-562.10) 8)  Hx of Other Esophagitis  (ICD-530.19) 9)  Unspecified Disorder of Kidney and Ureter  (ICD-593.9) 10)  Hx of Pud, Hx of  (ICD-V12.71) 11)  Personal History of Thrombophlebitis  (ICD-V12.52) 12)  Sle  (ICD-710.0) 13)  Chronic Kidney Disease Unspecified  (ICD-585.9) 14)  Diabetes Mellitus, Type II   (ICD-250.00) 15)  Arthritis, Left Shoulder  (ICD-716.91) 16)  Anemia in Chronic Kidney Disease  (ICD-285.21) 17)  Essential Hypertension, Benign  (ICD-401.1)  Current Medications (verified): 1)  Aspir-Low 81 Mg Tbec (Aspirin) .Marland Kitchen.. 1 Tab By Mouth Once Daily 2)  Toprol Xl 100 Mg Xr24h-Tab (Metoprolol Succinate) .Marland Kitchen.. 1 Tab By Mouth Once Daily 3)  Glucotrol Xl 5 Mg Xr24h-Tab (Glipizide) .Marland Kitchen.. 1 Tab By Mouth Once Daily 4)  Coumadin 5 Mg Tabs (Warfarin Sodium) .... As Directed 5)  Zocor 80 Mg Tabs (Simvastatin) .Marland Kitchen.. 1 Tab By Mouth At Bedtime 6)  Catapres-Tts-1 0.1 Mg/24hr Ptwk (Clonidine Hcl) .Marland Kitchen.. 1 Patch Daily 7)  Ferro-Bob 325 (65 Fe) Mg Tabs (Ferrous Sulfate) .Marland Kitchen.. 1 Tab By Mouth Once Daily 8)  Colace 100 Mg Caps (Docusate Sodium) .Marland Kitchen.. 1 Tab By Mouth Two Times A Day 9)  Hydrocodone-Acetaminophen 2.5-500 Mg Tabs (Hydrocodone-Acetaminophen) .... Can Take 1 Pill Every 8 Hours As Needed 10)  Tramadol Hcl 50 Mg Tabs (Tramadol Hcl) .... Take 1/2 Tab By Mouth Q8 Hr As Needed For Shoulder Pain 11)  Januvia 50 Mg Tabs (Sitagliptin Phosphate) .... Once Daily 12)  Flonase 50 Mcg/act Susp (Fluticasone Propionate) .Marland Kitchen.. 1 Spray Each Nostril Daily 13)  Calcium-Vitamin D 500-125 Mg-Unit Tabs (Calcium-Vitamin D) .Marland Kitchen.. 1 Two Times A Day 14)  Furosemide 40 Mg Tabs (Furosemide) .... One Pill Daily 15)  Vitamin B-12 100 Mcg Tabs (Cyanocobalamin) .Marland Kitchen.. 1 Tab Daily  16)  Folic Acid 1 Mg Tabs (Folic Acid) .Marland Kitchen.. 1 Tab Daily 17)  Theragran-M  Tabs (Multiple Vitamins-Minerals) .Marland Kitchen.. 1 Tab Daily 18)  Prednisone 20 Mg Tabs (Prednisone) .... Take 2 Tabs By Mouth Daily For Five Days 19)  Prodigy Blood Glucose Test  Strp (Glucose Blood) .... Use As Directed 20)  Prodigy Twist Top Lancets 28g  Misc (Lancets) .... Use As Directed 21)  Bd Disp Needles 25g X 5/8" Misc (Needle (Disp)) .... Use As Directed  Allergies: No Known Drug Allergies  Past History:  Past Medical History: Last updated: 05/30/2010 HTN, T2DM, anemia, ckd,  Pt is deaf and talks with translator, ? hx of lupus still need results from [facility] ? of ulcers Lung nodule stable Liver nodle stable- likely should f/u periodically DIVERTICULOSIS OF COLON Hx of OTHER ESOPHAGITIS  UNSPECIFIED DISORDER OF KIDNEY AND URETER PERSONAL HISTORY OF THROMBOPHLEBITIS  SLE CHRONIC KIDNEY DISEASE UNSPECIFIED  DIABETES MELLITUS, TYPE II  ARTHRITIS, LEFT SHOULDER   Hyperkalemia   Past Surgical History: Last updated: 02/01/2010 Right shoulder surgery  remote hx Pt has abdominal scar, likely hernia repair Flu shot in 10/10 Colonoscopy in 2010  Family History: Last updated: 02/01/2010 Heart dx in mother DM in both parents Prostate cancer in father  Social History: Last updated: 02/01/2010 Lives with Daughter Philip Matthews), who is also his POA no smoke, no drink, no illicits Like to go to [employer] homes and do activities with peers.    Review of Systems       Denies fever, malais, weight loss, blurry vision, decreased visual acuity, cough, sputum, SOB, hemoptysis, pleuritic pain, palpitaitons, heartburn, abdominal pain, melena, lower extremity edema, claudication, or rash.   Vital Signs:  Patient profile:   77 year old male Height:      64.5 inches Weight:      142 pounds BMI:     24.08 Pulse rate:   55 / minute Resp:     12 per minute BP sitting:   154 / 69  (left arm)  Vitals Entered By: Kem Parkinson (June 20, 2010 2:44 PM)  Physical Exam  General:  Affect appropriate Healthy:  appears stated age HEENT: normal Neck supple with no adenopathy JVP normal no bruits no thyromegaly Lungs clear with no wheezing and good diaphragmatic motion Heart:  S1/S2 no murmur,rub, gallop or click PMI normal Abdomen: benighn, BS positve, no tenderness, no AAA no bruit.  No HSM or HJR Distal pulses intact with no bruits No edema Neuro non-focal Skin warm and dry Left shoulder frozen Previous right shoulder surgery Midline abdominal  scar   Impression & Recommendations:  Problem # 1:  PRE-OPERATIVE CARDIOVASCULAR EXAMINATION (ICD-V72.81) Dobutamine echo and clear to have surgery if this is normal His updated medication list for this problem includes:    Aspir-low 81 Mg Tbec (Aspirin) .Marland Kitchen... 1 tab by mouth once daily    Toprol Xl 100 Mg Xr24h-tab (Metoprolol succinate) .Marland Kitchen... 1 tab by mouth once daily    Coumadin 5 Mg Tabs (Warfarin sodium) .Marland Kitchen... As directed  Orders: Dobutamine Echo (Dobutamine Echo)  Problem # 2:  Hx of PORTAL VEIN THROMBOSIS (ICD-452) No longer drinking and I am not sure that this is an indication for chronic coumadin Rx as he indicates being on it for over 6 years.  F/U LFT's and liver imaging per primary His updated medication list for this problem includes:    Aspir-low 81 Mg Tbec (Aspirin) .Marland Kitchen... 1 tab by mouth once daily    Coumadin  5 Mg Tabs (Warfarin sodium) .Marland Kitchen... As directed  Problem # 3:  UNSPECIFIED DISORDER OF KIDNEY AND URETER (ICD-593.9) Sees nephrology.  Last Cr 6/21 was normal.  ??  Problem # 4:  ESSENTIAL HYPERTENSION, BENIGN (ICD-401.1) Well controlled should have catapres patch on for surgery His updated medication list for this problem includes:    Aspir-low 81 Mg Tbec (Aspirin) .Marland Kitchen... 1 tab by mouth once daily    Toprol Xl 100 Mg Xr24h-tab (Metoprolol succinate) .Marland Kitchen... 1 tab by mouth once daily    Catapres-tts-1 0.1 Mg/24hr Ptwk (Clonidine hcl) .Marland Kitchen... 1 patch daily    Furosemide 40 Mg Tabs (Furosemide) ..... One pill daily  Orders: EKG w/ Interpretation (93000)  Problem # 5:  MIXED HYPERLIPIDEMIA (ICD-272.2) Should not be on high dose zocor given recent FDA warning especially with history of liver disease.  Will call in Pravachol 40mg  which can be increased by primary to 80mg  in future if needed. His updated medication list for this problem includes:    Zocor 80 Mg Tabs (Simvastatin) .Marland Kitchen... 1 tab by mouth at bedtime  Patient Instructions: 1)  Your physician recommends that  you schedule a follow-up appointment in: AS NEEDED  2)  Your physician recommends that you continue on your current medications as directed. Please refer to the Current Medication list given to you today. 3)  Your physician has requested that you have a dobutamine echocardiogram.  For further information please visit [email or web address]  Please follow instruction sheet as given. 4)  Synetta Fail OWENS  AT [phone]  FOR INTERPRETER SERVICES   EKG Report  Procedure date:  06/20/2010  Findings:      SR 56 Otherwise normal ECG

## 2011-01-29 NOTE — Assessment & Plan Note (Signed)
Summary: flu shot/bmc  Nurse Visit   Vital Signs:  Patient profile:   77 year old male Temp:     97.8 degrees F  Vitals Entered By: Theresia Lo RN (October 15, 2010 4:49 PM)  Allergies: No Known Drug Allergies  Immunizations Administered:  Influenza Vaccine # 1:    Vaccine Type: Fluvax MCR    Site: right deltoid    Mfr: GlaxoSmithKline    Dose: 0.5 ml    Route: IM    Given by: Theresia Lo RN    Exp. Date: 06/26/2011    Lot #: [id number]    VIS given: 07/24/10 version given October 15, 2010.  Flu Vaccine Consent Questions:    Do you have a history of severe allergic reactions to this vaccine? no    Any prior history of allergic reactions to egg and/or gelatin? no    Do you have a sensitivity to the preservative Thimersol? no    Do you have a past history of Guillan-Barre Syndrome? no    Do you currently have an acute febrile illness? no    Have you ever had a severe reaction to latex? no    Vaccine information given and explained to patient? yes  Orders Added: 1)  Influenza Vaccine MCR [00025] 2)  Administration Flu vaccine - MCR [G0008]    appointment scheduled with Dr. Katrinka Blazing for  follow up check up and patient inquires about receiving  pneumonia. will discuss with MD at that visit. Theresia Lo RN  October 15, 2010 4:52 PM

## 2011-01-29 NOTE — Progress Notes (Signed)
Summary: phn msg  Phone Note Other Incoming Call back at [phone]   Caller: [facility] Summary of Call: Mr. Mcbain GI doctor held his coumadin for 10 days restarted it last night.  So an INR was not drawn today. When would Dr. Katrinka Blazing like this rechecked.   Did check a renal profile and mag level for Dr Allena Katz.  If Dr. Katrinka Blazing would like results of this let Harvin Hazel know and she will fax to [place]. Initial call taken by: Clydell Hakim,  July 03, 2010 1:50 PM  Follow-up for Phone Call        Talked to [facility], she will draw an INR next week and also send in the test results. Follow-up by: Antoine Primas DO,  July 03, 2010 3:49 PM

## 2011-01-29 NOTE — Progress Notes (Signed)
Summary: phn msg  Phone Note From Other Clinic Call back at [phone]   [facility] Call: PT/INR is - 2.7 & 19.7 put the same coumidin in box  pls confirm that she needs to recheck in 2 weeks  Called kelly told her to recheck in 2 weeks Initial call taken by: De Nurse,  March 06, 2010 8:43 AM

## 2011-01-29 NOTE — Progress Notes (Signed)
Summary: phn msg  Phone Note Other Incoming Call back at [phone]   Caller: [facility] Summary of Call: Mr. Crescenzo is having some right great toe pain.  He also needs new rx for glucose supplies.  Prodigy is what he uses. Initial call taken by: Clydell Hakim,  June 06, 2010 2:44 PM  Follow-up for Phone Call        saw pt today took care of everything

## 2011-01-29 NOTE — Consult Note (Signed)
Summary: [facility]- wprsening kidney dx without etiology  [facility]   Imported By: Clydell Hakim 06/29/2010 13:53:53  _____________________________________________________________________  External Attachment:    Type:   Image     Comment:   External Document

## 2011-01-29 NOTE — Progress Notes (Signed)
Summary: in office for pre-op b/p 200/100. surgery on tuesday.   Phone Note From Other Clinic   Caller: susan office [phone] Request: Talk with Nurse Summary of Call: pt having surgery on tuesday, in office today for pre-op md concern about his b/p 200/100. pt did get cardiac clearance from pn.  Initial call taken by: Lorne Skeens,  July 06, 2010 11:46 AM  Follow-up for Phone Call        spoke w/Susan let her know we only saw pt one time and BP was 154/69, advised she should check w/pcp she is agreeable Meredith Staggers, RN  July 06, 2010 11:52 AM

## 2011-01-29 NOTE — Progress Notes (Signed)
Summary: phn msg  Phone Note Other Incoming Call back at [phone]   Caller: [facility] Summary of Call: Calling with information that Dr. Katrinka Blazing had requested.  Plaquenil is being taken for lupus.  Pt has lupus.  Pt taking Comudin for DVT that he had March of 2010.  Thinks it may have been his left arm.  Dr. Kathaleen Bury she had asked that his INR be checked the 15th of Feb. His coumadin dose is 7.5 mg Monday,Wednesday,Friday,Saturday & Sunday.  5mg  Tues. Thurs.   Initial call taken by: Clydell Hakim,  February 05, 2010 4:57 PM

## 2011-01-29 NOTE — Miscellaneous (Signed)
Summary: bp 200/100 this am     Clinical Lists Changes rec'd call from [facility]. his bp was 200/100 . they want to know if he has ever spiked like this before, new meds, etc. pcp texted.  I called Darl Pikes at ortho md office. surgery was to be Tuesday but md cancelled it because of this. pt is to contact us for appt with pcp to see about this issue. then see ortho again in 6 wks. Marland Kitchen Golden Circle RN  July 06, 2010 12:02 PM  Please call pt, they stopped by to talk to Dr. Katrinka Blazing.  Phone number is [phone].   Talked to Dr. Ave Filter, told him that he had been well controlled but will see and figure out what is going on.  Pt told Dr. Ave Filter that he is actually not having much pain at this time.

## 2011-01-29 NOTE — Letter (Signed)
Summary: Generic Letter  [facility]  [address]   [place], [place] [address]   Phone: [phone]  Fax: [phone]    03/13/2010  Philip Matthews [address] [place], [place]  [address]  Dear Mr. ABALOS,  After reviewing your records from [facility] Dr. Katrinka Blazing has decided to schedule you a follow up ultrasound to evaluate your liver and kidneys. This ultrasound has been scheduled at [facility] for March 28, 2010 at 9:00am. Please go to the admitting department of [facility] on the first floor to register. Also do not eat anything after midnight the night before your exam. If you have any questions feel free to call our office at [phone].         Sincerely,   Garen Grams LPN

## 2011-01-29 NOTE — Progress Notes (Signed)
Summary: Lab Res  Phone Note Call from Patient Call back at [phone]   Summary of Call: INR 1.1 PT 12.8 today.  Currently taking 5mg  of coumadin everyday and 7.5 Mon & Thurs.  Restarted coumadin and aspirin last Wednesday after being off for 1 week. Plans to see him next Tues. Initial call taken by: Clydell Hakim,  October 02, 2010 8:36 AM  Follow-up for Phone Call        called her told to do Coumadin 7.5mg  W, thur, fri and then go back to regular regimen.  Follow-up by: Antoine Primas DO,  October 02, 2010 1:27 PM

## 2011-01-29 NOTE — Progress Notes (Signed)
Summary: Lab Res  Phone Note Other Incoming Call back at [phone]   Caller: [facility] Summary of Call: INR 2.7 PT 19.9    Rechecking in a month unless ortherwise instructed.  Current dose is 7.5 mg Sat, Sun, & Mon, Wed, Fri. 5mg  Tues & Thurs. Mr. Keeling daughter said his bowel movements are more fowler and a very dark green, but not black.    pt is taking iron also. Initial call taken by: Clydell Hakim,  May 15, 2010 9:02 AM  Follow-up for Phone Call        called said one month is fine.   Asked about stools, she is not concern says no fever and no pain.  She will f/u though and if they are concern should come and be seen. Follow-up by: Antoine Primas DO,  May 15, 2010 3:43 PM

## 2011-01-29 NOTE — Consult Note (Signed)
Summary: [facility]  [facility]   Imported By: De Nurse 06/06/2010 10:22:13  _____________________________________________________________________  External Attachment:    Type:   Image     Comment:   External Document

## 2011-01-29 NOTE — Progress Notes (Signed)
Summary: phn msg  Phone Note Call from Patient Call back at Home Phone [phone]   Caller: Daughter- Elonda Husky Summary of Call: scheduled for Shoulder surgery on July 12th is on coumidin and is asking if he needs to stop this a few days before surgery asking that we call Dr Ave Filter at [phone] to discuss this meds Initial call taken by: De Nurse,  June 13, 2010 3:11 PM  Follow-up for Phone Call        called Dr. Ave Filter, in surgery called daughter back told her per message  that will talk to her more at our appointment on thursday regaurding the coumadin and lupus.   Follow-up by: Antoine Primas DO,  June 18, 2010 2:54 PM

## 2011-01-29 NOTE — Progress Notes (Signed)
Summary: Rx Ques  Phone Note Other Incoming Call back at [phone]   Caller: [facility] Summary of Call: Needs to confirm medicine dosage.  She is filling his medicine box Sodium Bicarb was taking 650 mg 2xs a day.  The new rx is for 650 1xs a day.  Needs to make sure this is correct. Initial call taken by: Clydell Hakim,  April 03, 2010 3:11 PM  Follow-up for Phone Call        spoke with [facility]. she states patient 's old bottle stated twice daily . was given by Dr. Fredric Mare from [facility] in [place]. she filled up pill box with one daily as directed by Dr. Katrinka Blazing. will double check with Dr. Katrinka Blazing and call her back to verify. will ask Dr. Katrinka Blazing to  please  send  message to team . Follow-up by: Theresia Lo RN,  April 03, 2010 4:34 PM    Additional Follow-up for Phone Call Additional follow up Details #2::    spoke to [place] to change to two times a day on bicarb  New/Updated Medications: SODIUM BICARBONATE 648 MG TABS (SODIUM BICARBONATE) 1 tab by mouth two times a day Prescriptions: SODIUM BICARBONATE 648 MG TABS (SODIUM BICARBONATE) 1 tab by mouth two times a day  #64 x 10   Entered by:   Antoine Primas DO   Authorized by:   . RN TEAM-[facility]   Signed by:   Antoine Primas DO on 04/03/2010   Method used:   Faxed to ...       [id number] (retail)       [address]       [place], [place]  [id number]       Ph: [phone]       Fax: [phone]   RxID:   [id number]

## 2011-01-29 NOTE — Letter (Signed)
Summary: [facility]  [facility]   Imported By: Marylou Mccoy 08/01/2010 09:58:00  _____________________________________________________________________  External Attachment:    Type:   Image     Comment:   External Document

## 2011-01-29 NOTE — Miscellaneous (Signed)
Summary: call from [facility]  Clinical Lists Changes received call from [facility] with [facility] stating patient's INR today is 3.6 and protime is 23 seconds. will forward to MD for further instructions and orders. Theresia Lo RN  December 04, 2010 8:56 AM  Can we call Tresa Endo and tell her to hold the coumadin for one day and then check again next week?  Thanks message left  for Tresa Endo to return call. Theresia Lo RN  December 04, 2010 10:58 AM    Tresa Endo notified. she states Mr. Dirr has a scheduled appointment with Dr. Katrinka Blazing next Tuesday and he will have PT/INR check here if that will be ok . will send to MD to please enter order for lab that day.  Theresia Lo RN  December 04, 2010 11:27 AM . Orders: Added new Test order of INR/PT-[facility] ([address]) - Signed

## 2011-01-29 NOTE — Progress Notes (Signed)
Summary: fyi  Phone Note From Other Clinic Call back at [phone]   [facility] Summary of Call: PT/INR - 1.8 & 16.4 forgot coumidin dose - had had more greens in meals last week. she has talked with him about limiting his greens and went over his meds box to make sure he didn't miss a dose. Initial call taken by: De Nurse,  March 20, 2010 9:15 AM  Follow-up for Phone Call        wanted to clarify to recheck protime in 1 month. (she needs an OK)  needs refill on Sodium Bicarb 650mg  1 tab 2xdaily nephrologist was giving this and pt is not going there anymore. [facility]- [id number]  Follow-up by: De Nurse,  March 22, 2010 9:41 AM  Additional Follow-up for Phone Call Additional follow up Details #1::        to pcp  Additional Follow-up by: Golden Circle RN,  March 22, 2010 9:50 AM    Additional Follow-up for Phone Call Additional follow up Details #2::    fine to check again in 1 month.  Sent in prescription. LM for kelly. Follow-up by: Antoine Primas DO,  March 22, 2010 1:43 PM

## 2011-01-29 NOTE — Progress Notes (Signed)
Summary: phn msg  Phone Note From Other Clinic   Caller: [facility] Summary of Call: continue taking prednisone 5mg  everyday until reseen by Dr Dareen Piano in October Initial call taken by: De Nurse,  August 28, 2010 4:25 PM

## 2011-01-29 NOTE — Progress Notes (Signed)
Summary: INR=3.76/Please dose  Phone Note Other Incoming   Caller: Scarlette Calico with [facility] [phone] Summary of Call: Scarlette Calico from [place] was calling report of Pts. PT/INR.  States PT is high at 89.3/INR high at 3.76. Will forward to Dr. Katrinka Blazing for dosing. Initial call taken by: Terese Door,  November 06, 2010 11:17 AM  Follow-up for Phone Call        Report called to MD.  He wants pts to hold the next two day and have blood rechecked again in 1 week.  Relayed message to [place].  She questioned if she was not able to get in contact with pt today (as pt is deaf and relys in interpretor phone line) and he has already taken today then should he hold wednesday and thursday.  Advised that this is ok. Will forward to MD for FYI. Follow-up by: Jone Baseman CMA,  November 06, 2010 5:01 PM  Additional Follow-up for Phone Call Additional follow up Details #1::        confirmed.  Additional Follow-up by: Antoine Primas DO,  November 06, 2010 5:25 PM

## 2011-01-29 NOTE — Progress Notes (Signed)
  Phone Note Other Incoming Call back at [phone]   Caller: [facility] Summary of Call: So pt today for re cert visit.  Would to like continue seeing patient on bi weekly basis for medication box refill and labs for Dr. Katrinka Blazing.  The patient has been holding his coumadin for 3 days for eye surgery on the 26th.   Want to know when physician would want to have his INR rechecked. Bp was elevated @ 172/92 and is he a candidate for the pneumo vax. Initial call taken by: Abundio Miu,  October 23, 2010 9:29 AM  Follow-up for Phone Call        called kelly told her to continue seeing pt biweekly for medications and to check his INR next week.  Told eher to get blood pressure next visit as well and if elevated then to have him make an appointment earlier. Follow-up by: Antoine Primas DO,  October 23, 2010 12:58 PM

## 2011-01-29 NOTE — Progress Notes (Signed)
Summary: [facility] RN call  Phone Note From Other Clinic   Caller: Bobette Mo RN [facility] Summary of Call: Patient having a lot of pain this am in toes.  Gout medicaiton was recently switched  to Uloric.  Called Dr. Katrinka Blazing and he gave the order to increase the Uloric to 2 pills  a day.  Pt's INR was 3.8 and PT was 23.7.  Order given to hold the next dose of Coumadin and resume same dose afterwards.  RN wanted to know if INR should be repeated in one week, told her yes.  Also instructed to call us for any additional issues.  She was agreeable. Initial call taken by: Dennison Nancy RN,  November 20, 2010 9:08 AM  Follow-up for Phone Call        pt is out of Uloric -pls call in increase 80 mg dosage to [facility] - [phone] Follow-up by: De Nurse,  November 20, 2010 9:09 AM  Additional Follow-up for Phone Call Additional follow up Details #1::        sent in new rx.  If pain does worsen tell pt to start his prednisione again to see if that will help and continue it for 5 days.  Additional Follow-up by: Antoine Primas DO,  November 20, 2010 9:40 AM    New/Updated Medications: ULORIC 80 MG TABS (FEBUXOSTAT) 1 tab daily Prescriptions: ULORIC 80 MG TABS (FEBUXOSTAT) 1 tab daily  #31 x 3   Entered and Authorized by:   Antoine Primas DO   Signed by:   Antoine Primas DO on 11/20/2010   Method used:   Electronically to        [facility] (retail)       [address]       [place], [place]  [id number]       Ph: [phone]       Fax: [phone]   RxID:   [id number]

## 2011-01-29 NOTE — Progress Notes (Signed)
Summary: phn msg  Phone Note From Other Clinic Call back at [phone]   [facility] Call: INR - 1.7 - PT 15.8 held last 2 doses from last week finished Prednisone last Tues. put 7.5mg  Sat, Sun, Mon, Wed Frid 5mg  Tues & thurs Tresa Endo is waiting to hear from Dr to tell him more things Initial call taken by: De Nurse,  June 19, 2010 9:07 AM  Follow-up for Phone Call        [facility], told her to hold coumadin starting on 6/25 so pt can have endoscopy on 6/29.  Will restart on 30th.  Then will hold again on 7/7 for shoulder surgery on the 7/12 and will restatrt day after surgery.  Told her we will try to contact Dr. Oswaldo Done about use of chronic coumadin in face of portal vein thombosis.   Will also draw ANA, dsDNA, anti [place]son ab at next visit.  If all negative then likely can stop coumadin.   Follow-up by: Antoine Primas DO,  June 19, 2010 10:08 AM

## 2011-01-29 NOTE — Progress Notes (Signed)
Summary: Lab Res & Orders Needed  Phone Note Other Incoming Call back at [phone]   Caller: [facility] Summary of Call: His PT INR was 2.1 and 17.9.  Also needs recert orders to continue labs monthly and refill medicine box. Initial call taken by: Clydell Hakim,  April 17, 2010 12:00 PM  Follow-up for Phone Call        [place], LM stating to continue current dose and to recheck monthly and refill pill box.   Follow-up by: Antoine Primas DO,  April 17, 2010 1:41 PM

## 2011-01-29 NOTE — Progress Notes (Signed)
Summary: phone note  Phone Note Other Incoming Call back at [phone]   Caller: kelly Summary of Call: mr.Macconnell has decided to cancel his surgery perm. skipped 1 dose of coumidin do you want her to check inr 7/12 or wait unil tues 7/19 Initial call taken by: Loralee Pacas CMA,  July 06, 2010 2:43 PM  Follow-up for Phone Call        check INR tomorrow, and BP Follow-up by: Antoine Primas DO,  July 09, 2010 11:41 AM

## 2011-01-29 NOTE — Progress Notes (Signed)
Summary: phn msg  Phone Note From Other Clinic Call back at [phone]   [facility] Summary of Call: cardiologist did release pt to have shoulder surgery on 7/12 - pt is holding his coumidin starting tonight until surgery.  will be in surgery on the day his pt/inr and needs to find out when it should be drawn next. Initial call taken by: De Nurse,  July 04, 2010 4:54 PM  Follow-up for Phone Call        called told her to draw the labs the week after his surgery Follow-up by: Antoine Primas DO,  July 04, 2010 5:01 PM

## 2011-01-29 NOTE — Miscellaneous (Signed)
Summary: PT/INR  Clinical Lists Changes INR is 4.6 PT 26.1  his rhumatologist starte him on prednisone & Tresa Endo RN from [facility] states he does this when he is on prednisone. wants to know if dose change or just hold tonight's dose? [phone]. she is there now filling his med box. told her I will get pcp to let me know & I will call her back.Golden Circle RN  August 28, 2010 2:35 PM Pt is doing prednisione, told to go to 5mg  daily for next week and recheck at the time.  Call Dr. Dareen Piano and find out how long they want him to take prednisione.

## 2011-01-29 NOTE — Miscellaneous (Signed)
Summary: UPDATE  FOR MRI and U/S for F/u on KIDNEY MASS and LIVER MASS  Clinical Lists Changes Went through paperwork found interesting information.  Will update chart.   Renal mass likely lipoma but can't r/o rcc.  need ultrasound ASAP, was due in 2/11 per records.   Need abdominal u/s as well to check fatty mass in liver.  Could consider MRI. hx of Billroth 2 procedure for bleeding ulcers.  Pt should stop his ASA if on coumadin.  Has not had any bleeding recently.  Pt has left hepatoectomy  Problems: Added new problem of PERSONAL HISTORY OF THROMBOPHLEBITIS (ICD-V12.52) - was due to PICC line.  Changed problem from Question of  SLE (ICD-710.0) to SLE (ICD-710.0) Added new problem of History of  PUD, HX OF (ICD-V12.71) Added new problem of UNSPECIFIED DISORDER OF KIDNEY AND URETER (ICD-593.9) - mass on right kidney.  Needs u/s or MRI in 2011 to follow size.   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  Will need f/u, likely benign per last radiology report. Added new problem of LIVER MASS (ICD-235.3) Orders: Added new Test order of Ultrasound (Ultrasound) - Signed Observations: Added new observation of LIPID PROGRS: N/A (03/08/2010 20:53) Added new observation of LIPID FSREVW: N/A (03/08/2010 20:53)      Prevention & Chronic Care Immunizations   Influenza vaccine: Not documented   Influenza vaccine due: 08/30/2010    Tetanus booster: Not documented    Pneumococcal vaccine: Not documented    H. zoster vaccine: Not documented  Colorectal Screening   Hemoccult: Not documented    Colonoscopy: Not documented   Colonoscopy action/deferral: Not indicated  (03/06/2010)   Colonoscopy due: 10/20/2014  Other Screening   PSA: Not documented   PSA action/deferral: Discussed-decision deferred  (02/01/2010)   Smoking status: never   (03/06/2010)  Diabetes Mellitus   HgbA1C: Not documented    Eye exam: Not documented    Foot exam: Not documented   High risk foot: Not documented   Foot care education: Not documented    Urine microalbumin/creatinine ratio: Not documented  Lipids   Total Cholesterol: Not documented   LDL: Not documented   LDL Direct: Not documented   HDL: Not documented   Triglycerides: Not documented  Hypertension   Last Blood Pressure: 167 / 81  (03/06/2010)   Serum creatinine: 1.86  (02/01/2010)   Serum potassium 4.7  (02/01/2010)  Self-Management Support :    Diabetes self-management support: Not documented    Hypertension self-management support: Not documented

## 2011-01-29 NOTE — Consult Note (Signed)
Summary: [facility]   Imported By: De Nurse 11/08/2010 16:00:42  _____________________________________________________________________  External Attachment:    Type:   Image     Comment:   External Document

## 2011-01-29 NOTE — Progress Notes (Signed)
Summary: surgical clearance  Phone Note From Other Clinic   Caller: nurse Darl Pikes Summary of Call: Per Darl Pikes Dr [facility] pt needs surgical clearance letter sent. SUrgery is July 12th. Please send to fax [phone]. ofc [id number] Initial call taken by: Edman Circle,  June 29, 2010 8:58 AM  Follow-up for Phone Call        normal stess echo .  Ok for surgery. Stop coumadin at least 5 days before Follow-up by: Colon Branch, MD, [facility],  June 30, 2010 10:40 PM     Appended Document: surgical clearance Fax sent to Dr Ave Filter ofc [phone]

## 2011-01-29 NOTE — Progress Notes (Signed)
Summary: phn msg  Phone Note Call from Patient Call back at [phone]   Caller: [facility] Summary of Call: Needs to know when to do next INR?  Initial call taken by: Clydell Hakim,  February 21, 2010 3:45 PM  Follow-up for Phone Call        Called her back told her to do it at next visit and then 2 weeks after that, call if have any concerns

## 2011-01-29 NOTE — Miscellaneous (Signed)
Summary: Request for [employer]  Pt's daughter has requested a Quad cane for his use during gout flare ups.  He's currenly using a mop for support when he moves around the house.  Tresa Endo, his [facility] RN, has placed an order for it with [facility] medical equipment division.  They will send [place] the order for Dr. Michaelle Copas signature signature.  Dennison Nancy RN  November 20, 2010 12:01 PM

## 2011-01-29 NOTE — Assessment & Plan Note (Signed)
Summary: gout issue/[place]   Vital Signs:  Patient profile:   77 year old male Weight:      144 pounds Pulse rate:   57 / minute BP sitting:   140 / 72  (right arm)  Vitals Entered By: Arlyss Repress CMA, (July 13, 2010 8:36 AM) CC: gout flare up. left big toe pain x 4 days. Pain Assessment Patient in pain? yes     Location: left big toe Onset of pain  x 4 days   Primary Care Provider:  Antoine Primas DO  CC:  gout flare up. left big toe pain x 4 days.Marland Kitchen  History of Present Illness: Gout Pt had a gout flare recently and was treated with Prednisone. He has CKD and can not be put on Colchicine or diclofenac. he had a good response from the prednisone. But as soon as he started feeling better he started eating a lot more meat. he now has another flare that started 4 days ago. The pain is in his left great toe MTP joint.   Habits & Providers  Alcohol-Tobacco-Diet     Tobacco Status: quit > 6 months  Comments: denies use of alcohol x 1 year  Current Medications (verified): 1)  Aspir-Low 81 Mg Tbec (Aspirin) .Marland Kitchen.. 1 Tab By Mouth Once Daily 2)  Toprol Xl 100 Mg Xr24h-Tab (Metoprolol Succinate) .Marland Kitchen.. 1 Tab By Mouth Once Daily 3)  Glucotrol Xl 5 Mg Xr24h-Tab (Glipizide) .Marland Kitchen.. 1 Tab By Mouth Once Daily 4)  Coumadin 5 Mg Tabs (Warfarin Sodium) .... As Directed 5)  Pravastatin Sodium 40 Mg Tabs (Pravastatin Sodium) .Marland Kitchen.. 1 Once Daily 6)  Catapres-Tts-1 0.1 Mg/24hr Ptwk (Clonidine Hcl) .Marland Kitchen.. 1 Patch Daily 7)  Ferro-Bob 325 (65 Fe) Mg Tabs (Ferrous Sulfate) .Marland Kitchen.. 1 Tab By Mouth Once Daily 8)  Colace 100 Mg Caps (Docusate Sodium) .Marland Kitchen.. 1 Tab By Mouth Two Times A Day 9)  Januvia 50 Mg Tabs (Sitagliptin Phosphate) .... Once Daily 10)  Calcium-Vitamin D 500-125 Mg-Unit Tabs (Calcium-Vitamin D) .Marland Kitchen.. 1 Two Times A Day 11)  Furosemide 40 Mg Tabs (Furosemide) .... One Pill Daily 12)  Vitamin B-12 100 Mcg Tabs (Cyanocobalamin) .Marland Kitchen.. 1 Tab Daily 13)  Folic Acid 1 Mg Tabs (Folic Acid) .Marland Kitchen.. 1 Tab Daily 14)   Theragran-M  Tabs (Multiple Vitamins-Minerals) .Marland Kitchen.. 1 Tab Daily 15)  Prodigy Blood Glucose Test  Strp (Glucose Blood) .... Use As Directed 16)  Prodigy Twist Top Lancets 28g  Misc (Lancets) .... Use As Directed 17)  Bd Disp Needles 25g X 5/8" Misc (Needle (Disp)) .... Use As Directed 18)  Prednisone 20 Mg Tabs (Prednisone) .... Take 2 Tablets A Day For 5 Days.  Allergies: No Known Drug Allergies  Social History: Pt is deaf Lives with Daughter Philip Matthews), who is also his POA no smoke, no drink, no illicits Like to go to nusing homes and do activities with peers.   Smoking Status:  quit > 6 months  Review of Systems        vitals reviewed and pertinent negatives and positives seen in HPI   Physical Exam  General:  Well-developed,well-nourished,in no acute distress; alert,appropriate and cooperative throughout examination Extremities:  left MTP joint is tender, trace swelling and erythema.    Impression & Recommendations:  Problem # 1:  GOUT, UNSPECIFIED (ICD-274.9) Assessment Deteriorated Pt comes in today with another gout flare. He was suppose to get bloodwork at his next visit but because he is having another flare so early plan to get the uric  acid level today. He has discuss the results with his PCP at the next visit. Can not call results as pt is deaf and needs translator. Pt given 5 day course of prednisone again. Would consider Allopurinol for his feet if his uric acid is elevated after his flare has resolved.   Orders: Uric Acid-[facility] ([address]) [facility]- Est Level  3 (09811)  Complete Medication List: 1)  Aspir-low 81 Mg Tbec (Aspirin) .Marland Kitchen.. 1 tab by mouth once daily 2)  Toprol Xl 100 Mg Xr24h-tab (Metoprolol succinate) .Marland Kitchen.. 1 tab by mouth once daily 3)  Glucotrol Xl 5 Mg Xr24h-tab (Glipizide) .Marland Kitchen.. 1 tab by mouth once daily 4)  Coumadin 5 Mg Tabs (Warfarin sodium) .... As directed 5)  Pravastatin Sodium 40 Mg Tabs (Pravastatin sodium) .Marland Kitchen.. 1 once daily 6)   Catapres-tts-1 0.1 Mg/24hr Ptwk (Clonidine hcl) .Marland Kitchen.. 1 patch daily 7)  Ferro-bob 325 (65 Fe) Mg Tabs (Ferrous sulfate) .Marland Kitchen.. 1 tab by mouth once daily 8)  Colace 100 Mg Caps (Docusate sodium) .Marland Kitchen.. 1 tab by mouth two times a day 9)  Januvia 50 Mg Tabs (Sitagliptin phosphate) .... Once daily 10)  Calcium-vitamin D 500-125 Mg-unit Tabs (Calcium-vitamin d) .Marland Kitchen.. 1 two times a day 11)  Furosemide 40 Mg Tabs (Furosemide) .... One pill daily 12)  Vitamin B-12 100 Mcg Tabs (Cyanocobalamin) .Marland Kitchen.. 1 tab daily 13)  Folic Acid 1 Mg Tabs (Folic acid) .Marland Kitchen.. 1 tab daily 14)  Theragran-m Tabs (Multiple vitamins-minerals) .Marland Kitchen.. 1 tab daily 15)  Prodigy Blood Glucose Test Strp (Glucose blood) .... Use as directed 16)  Prodigy Twist Top Lancets 28g Misc (Lancets) .... Use as directed 17)  Bd Disp Needles 25g X 5/8" Misc (Needle (disp)) .... Use as directed 18)  Prednisone 20 Mg Tabs (Prednisone) .... Take 2 tablets a day for 5 days. Prescriptions: PREDNISONE 20 MG TABS (PREDNISONE) take 2 tablets a day for 5 days.  #10 x 0   Entered and Authorized by:   Jamie Brookes MD   Signed by:   Jamie Brookes MD on 07/13/2010   Method used:   Electronically to        [facility] (retail)       [address]       [place], [place]  [id number]       Ph: [phone]       Fax: [phone]   RxID:   [id number]

## 2011-01-29 NOTE — Progress Notes (Signed)
Summary: phn msg  Phone Note From Other Clinic Call back at [phone]   Caller: [facility] Summary of Call: PT/INR results are 3.1 & 21.3 current coumidin dosage is 7.5mg  M/W/F/S/S & 5mg  T&TH has 5mg  tab in home Initial call taken by: De Nurse,  February 13, 2010 8:54 AM  Follow-up for Phone Call        Provider notified Will have office staff hold his next dose of warfarin then continue current plan.  Tell pt to stay away form such things in his diet as spinach or green leafy vegetables     Appended Document: phn msg Kelly informed and agreeable

## 2011-01-29 NOTE — Consult Note (Signed)
Summary: [facility]  [facility]   Imported By: Marylou Mccoy 05/29/2010 12:13:01  _____________________________________________________________________  External Attachment:    Type:   Image     Comment:   External Document

## 2011-01-29 NOTE — Consult Note (Signed)
Summary: [facility] will see Dr. Ave Filter needs surgery  [facility]   Imported By: Clydell Hakim 06/05/2010 12:29:36  _____________________________________________________________________  External Attachment:    Type:   Image     Comment:   External Document

## 2011-01-29 NOTE — Consult Note (Signed)
Summary: [facility]   Imported By: Clydell Hakim 08/22/2010 15:11:38  _____________________________________________________________________  External Attachment:    Type:   Image     Comment:   External Document

## 2011-01-29 NOTE — Letter (Signed)
Summary: Consultant Letter  All     ,     Phone:   Fax:     05/15/2010  Dear Dr. Ave Filter:  I am writing to report my evaluation of Philip Matthews, a 77 Years Old Male [facility] and is in need of surgical clearance. When I saw the patient the Chief Complaint was f/u shoulder pain.  At that time the history of the present illness was as follows: Pt is here for f/u for mutliple problems 1.  BP. 112/71 today.  Pt is still on meds.  Denies HA, abdominal pain numbness or tingling in any extremities.  HR 79.  On toprol and catapress.    2.  Shoulder pain.  Left shoulder still giving problems, shot did help some but then pain returned.  Pt is finishing PT this week.  Did not feel that it helped very much.  Has home exercises as well. Percocet does not seem to be helping anymore either.  Does want to avoid surgery but willing if only option  3.  DM- Sugars have been good in the 120's, 130's at home.  Only on glipizide 5 mg.  Told to avoid metformin due to KIDNEY ISSUES  4.  CKD stage 3-  Pt is doing well, urinating still fine without hesitation.  Pt has appt with [facility] on 4/27.    Philip Matthews's Past Medical History at the time of my evaluation was notable for: LIVER MASS benigin no change in size (ICD-235.3), LUNG NODULE benign (ICD-518.89), DIVERTICULOSIS OF COLON (ICD-562.10), Hx of OTHER ESOPHAGITIS (ICD-530.19), UNSPECIFIED DISORDER OF KIDNEY AND URETER (ICD-593.9), Hx of PUD, HX OF (ICD-V12.71), PERSONAL HISTORY OF THROMBOPHLEBITIS (ICD-V12.52), SLE (ICD-710.0), CHRONIC KIDNEY DISEASE UNSPECIFIED (ICD-585.9), DIABETES MELLITUS, TYPE II (ICD-250.00), ARTHRITIS, LEFT SHOULDER (ICD-716.91), ANEMIA IN CHRONIC KIDNEY DISEASE (ICD-285.21), ESSENTIAL HYPERTENSION, BENIGN (ICD-401.1).  .  Other history is as follows: HTN, T2DM, anemia, ckd, Pt is deaf and talks with translator,  Lung nodule stable Liver nodle stable- likely should f/u periodically .  The family history is  notable for: Heart dx in mother DM in both parents Prostate cancer in father.  The social history is notable for: Lives with Daughter Philip Matthews), who is also his POA no smoke, no drink, no illicits Like to go to nusing homes and do activities with peers.     *At the moment pt chronic conditions are well controlle and from the data from last blood work pt seems to be stable for medical clearance to have reverse of total shoulde replacement.*  Pt should prbably have a BMET done before surgery to recheck kidney function.    Keiandre's active problems now include: LIVER MASS (ICD-235.3) LUNG NODULE (ICD-518.89) DIVERTICULOSIS OF COLON (ICD-562.10) Hx of OTHER ESOPHAGITIS (ICD-530.19) UNSPECIFIED DISORDER OF KIDNEY AND URETER (ICD-593.9) Hx of PUD, HX OF (ICD-V12.71) PERSONAL HISTORY OF THROMBOPHLEBITIS (ICD-V12.52) SLE (ICD-710.0) ? of SLE (ICD-710.0) CHRONIC KIDNEY DISEASE UNSPECIFIED (ICD-585.9) DIABETES MELLITUS, TYPE II (ICD-250.00) ARTHRITIS, LEFT SHOULDER (ICD-716.91) ANEMIA IN CHRONIC KIDNEY DISEASE (ICD-285.21) ESSENTIAL HYPERTENSION, BENIGN (ICD-401.1)   The medication list includes: ASPIR-LOW 81 MG TBEC (ASPIRIN) 1 tab by mouth once daily TOPROL XL 100 MG XR24H-TAB (METOPROLOL SUCCINATE) 1 tab by mouth once daily GLUCOTROL XL 5 MG XR24H-TAB (GLIPIZIDE) 1 tab by mouth once daily COUMADIN 5 MG TABS (WARFARIN SODIUM) as directed ZOCOR 80 MG TABS (SIMVASTATIN) 1 tab by mouth at bedtime SODIUM BICARBONATE 648 MG TABS (SODIUM BICARBONATE) 1 tab by mouth once daily CATAPRES-TTS-1 0.1 MG/24HR PTWK (  CLONIDINE HCL) 1 patch daily FERRO-BOB 325 (65 FE) MG TABS (FERROUS SULFATE) 1 tab by mouth once daily COLACE 100 MG CAPS (DOCUSATE SODIUM) 1 tab by mouth two times a day HYDROCODONE-ACETAMINOPHEN 2.5-500 MG TABS (HYDROCODONE-ACETAMINOPHEN) Can take 1 pill every 8 hours as needed TRAMADOL HCL 50 MG TABS (TRAMADOL HCL) take 1/2 tab by mouth Q8 hr as needed for shoulder pain JANUVIA 50 MG  TABS (SITAGLIPTIN PHOSPHATE) once daily FLONASE 50 MCG/ACT SUSP (FLUTICASONE PROPIONATE) 1 spray each nostril daily CALCIUM-VITAMIN D 500-125 MG-UNIT TABS (CALCIUM-VITAMIN D) tabs tabs once daily FUROSEMIDE 40 MG TABS (FUROSEMIDE) one pill daily VITAMIN B-12 100 MCG TABS (CYANOCOBALAMIN) 1 tab daily FOLIC ACID 1 MG TABS (FOLIC ACID) 1 tab daily THERAGRAN-M  TABS (MULTIPLE VITAMINS-MINERALS) 1 tab daily   Active Orders include the following: Comp Met-[facility] [[id number]] CBC-[facility] [85027] [facility]- Est  Level 4 [99214] Injection, intermediate joint - [facility] [20605] Physical Therapy Referral [PT] [facility]- Est  Level 4 [44010] Ultrasound [Ultrasound] Nephrology Referral [Nephro] Physical Therapy Referral [PT] [facility]- Est  Level 4 [27253] Orthopedic Referral [Ortho]  I will send copies of the results of the above investigations as they become available to your office if you wish. The most recent office note and chart summary are attached. Thank you for the opportunity to participate in the care of [facility].  Yours truly,   Antoine Primas DO  Appended Document: Consultant Letter 05/15/2010  Dear Dr. Ave Filter:  I am writing to report my evaluation of Philip Matthews, a 77 Years Old Male [facility] and is in need of surgical clearance. When I saw the patient the Chief Complaint was f/u shoulder pain.  At that time the history of the present illness was as follows: Pt is here for f/u for mutliple problems 1.  BP. 112/71 today.  Pt is still on meds.  Denies HA, abdominal pain numbness or tingling in any extremities.  HR 79.  On toprol and catapress.    2.  Shoulder pain.  Left shoulder still giving problems, shot did help some but then pain returned.  Pt is finishing PT this week.  Did not feel that it helped very much.  Has home exercises as well. Percocet does not seem to be helping anymore either.  Does want to avoid surgery but willing if only option  3.  DM- Sugars have been  good in the 120's, 130's at home.  Only on glipizide 5 mg.  Told to avoid metformin due to KIDNEY ISSUES  4.  CKD stage 3-  Pt is doing well, urinating still fine without hesitation.  Pt has appt with [facility] on 4/27.    Khi's Past Medical History at the time of my evaluation was notable for: LIVER MASS benigin no change in size (ICD-235.3), LUNG NODULE benign (ICD-518.89), DIVERTICULOSIS OF COLON (ICD-562.10), Hx of OTHER ESOPHAGITIS (ICD-530.19), UNSPECIFIED DISORDER OF KIDNEY AND URETER (ICD-593.9), Hx of PUD, HX OF (ICD-V12.71), PERSONAL HISTORY OF THROMBOPHLEBITIS (ICD-V12.52), SLE (ICD-710.0), CHRONIC KIDNEY DISEASE UNSPECIFIED (ICD-585.9), DIABETES MELLITUS, TYPE II (ICD-250.00), ARTHRITIS, LEFT SHOULDER (ICD-716.91), ANEMIA IN CHRONIC KIDNEY DISEASE (ICD-285.21), ESSENTIAL HYPERTENSION, BENIGN (ICD-401.1).  .  Other history is as follows: HTN, T2DM, anemia, ckd, Pt is deaf and talks with translator,  Lung nodule stable Liver nodle stable- likely should f/u periodically .  The family history is notable for: Heart dx in mother DM in both parents Prostate cancer in father.  The social history is notable for: Lives with Daughter Philip Matthews), who  is also his POA no smoke, no drink, no illicits Like to go to nusing homes and do activities with peers.     *At the moment pt chronic conditions are well controlled and from the data from last blood work pt seems to be stable and should be considered a moderate risk from a cradiopulmonary standpoint due to hx of renal insufficiency and diabetes.  Pt may benefit from having a stress test before having reverse of total shoulder replacement. Pt though Detsky's modified cardiac risk index pt is consider a low cardiac risk.*  Pt should probably have a BMET done before surgery to recheck kidney function.    Talor's active problems now include: LIVER MASS (ICD-235.3) LUNG NODULE (ICD-518.89) DIVERTICULOSIS OF COLON (ICD-562.10) Hx of OTHER  ESOPHAGITIS (ICD-530.19) UNSPECIFIED DISORDER OF KIDNEY AND URETER (ICD-593.9) Hx of PUD, HX OF (ICD-V12.71) PERSONAL HISTORY OF THROMBOPHLEBITIS (ICD-V12.52) SLE (ICD-710.0) ? of SLE (ICD-710.0) CHRONIC KIDNEY DISEASE UNSPECIFIED (ICD-585.9) DIABETES MELLITUS, TYPE II (ICD-250.00) ARTHRITIS, LEFT SHOULDER (ICD-716.91) ANEMIA IN CHRONIC KIDNEY DISEASE (ICD-285.21) ESSENTIAL HYPERTENSION, BENIGN (ICD-401.1)   The medication list includes: ASPIR-LOW 81 MG TBEC (ASPIRIN) 1 tab by mouth once daily TOPROL XL 100 MG XR24H-TAB (METOPROLOL SUCCINATE) 1 tab by mouth once daily GLUCOTROL XL 5 MG XR24H-TAB (GLIPIZIDE) 1 tab by mouth once daily COUMADIN 5 MG TABS (WARFARIN SODIUM) as directed ZOCOR 80 MG TABS (SIMVASTATIN) 1 tab by mouth at bedtime SODIUM BICARBONATE 648 MG TABS (SODIUM BICARBONATE) 1 tab by mouth once daily CATAPRES-TTS-1 0.1 MG/24HR PTWK (CLONIDINE HCL) 1 patch daily FERRO-BOB 325 (65 FE) MG TABS (FERROUS SULFATE) 1 tab by mouth once daily COLACE 100 MG CAPS (DOCUSATE SODIUM) 1 tab by mouth two times a day HYDROCODONE-ACETAMINOPHEN 2.5-500 MG TABS (HYDROCODONE-ACETAMINOPHEN) Can take 1 pill every 8 hours as needed TRAMADOL HCL 50 MG TABS (TRAMADOL HCL) take 1/2 tab by mouth Q8 hr as needed for shoulder pain JANUVIA 50 MG TABS (SITAGLIPTIN PHOSPHATE) once daily FLONASE 50 MCG/ACT SUSP (FLUTICASONE PROPIONATE) 1 spray each nostril daily CALCIUM-VITAMIN D 500-125 MG-UNIT TABS (CALCIUM-VITAMIN D) tabs tabs once daily FUROSEMIDE 40 MG TABS (FUROSEMIDE) one pill daily VITAMIN B-12 100 MCG TABS (CYANOCOBALAMIN) 1 tab daily FOLIC ACID 1 MG TABS (FOLIC ACID) 1 tab daily THERAGRAN-M  TABS (MULTIPLE VITAMINS-MINERALS) 1 tab daily   Active Orders include the following: Comp Met-[facility] [[id number]] CBC-[facility] [85027] [facility]- Est  Level 4 [99214] Injection, intermediate joint - [facility] [20605] Physical Therapy Referral [PT] [facility]- Est  Level 4 [16109] Ultrasound [Ultrasound] Nephrology Referral  [Nephro] Physical Therapy Referral [PT] [facility]- Est  Level 4 [60454] Orthopedic Referral [Ortho]  I will send copies of the results of the above investigations as they become available to your office if you wish. The most recent office note and chart summary are attached. Thank you for the opportunity to participate in the care of [facility].  Yours truly,   Antoine Primas DO   Signed by Antoine Primas DO on 05/15/2010 at 1:32 PM    Clinical Lists Changes

## 2011-01-29 NOTE — Assessment & Plan Note (Signed)
Summary: pain in toes/foot/eo   Vital Signs:  Patient profile:   77 year old male Height:      64.5 inches Weight:      142 pounds BMI:     24.08 BSA:     1.70 Temp:     99.4 degrees F Pulse rate:   76 / minute BP sitting:   128 / 75  Vitals Entered By: Jone Baseman CMA (June 07, 2010 2:31 PM)       bCC: pain in toes x 6 days Is Patient Diabetic? Yes Did you bring your meter with you today? No Pain Assessment Patient in pain? yes     Location: feet Intensity: 10   Primary Care Provider:  Antoine Primas DO  CC:  pain in toes x 6 days.  History of Present Illness: Pt is here for f/u for mutliple problems 1.  BP. 128/75 today.  Pt is still on meds.  Denies HA, abdominal pain numbness or tingling in any extremities.  HR 76.  On toprol and catapress.    2.  Shoulder pain.  Pt is going to have surgery in the near future but must get CV coverage has appt on June 23rd.  Pt told to make sure Warfarin is held days prior to surgery.  3.  DM- Sugars have been good in the 120's, 130's at home.  Only on glipizide 5 mg and januvia 50.  Told to avoid metformin due to KIDNEY ISSUES.  Hgb A1C today 7.6  4.  CKD stage 3-  Pt is doing well, urinating still fine without hesitation.  Pt seen by nephrologist, they want to make sure that pt anemia is due to chronic kidney disease and no GI so is holding iron for now,   5.  Left 1st toe pain-  Pt states that on sunday pt was having a lot of pain in toe and then it hurt in both feet for one day and then all pain went away except for his left fist toe.  Pain is all the time but worse with movement nothing seems to make it better, pt has not had any other medication changed and no change in diet but does eat alot of red meat.      Habits & Providers  Alcohol-Tobacco-Diet     Tobacco Status: never  Current Medications (verified): 1)  Aspir-Low 81 Mg Tbec (Aspirin) .Marland Kitchen.. 1 Tab By Mouth Once Daily 2)  Toprol Xl 100 Mg Xr24h-Tab (Metoprolol  Succinate) .Marland Kitchen.. 1 Tab By Mouth Once Daily 3)  Glucotrol Xl 5 Mg Xr24h-Tab (Glipizide) .Marland Kitchen.. 1 Tab By Mouth Once Daily 4)  Coumadin 5 Mg Tabs (Warfarin Sodium) .... As Directed 5)  Zocor 80 Mg Tabs (Simvastatin) .Marland Kitchen.. 1 Tab By Mouth At Bedtime 6)  Sodium Bicarbonate 648 Mg Tabs (Sodium Bicarbonate) .Marland Kitchen.. 1 Tab By Mouth Once Daily 7)  Catapres-Tts-1 0.1 Mg/24hr Ptwk (Clonidine Hcl) .Marland Kitchen.. 1 Patch Daily 8)  Ferro-Bob 325 (65 Fe) Mg Tabs (Ferrous Sulfate) .Marland Kitchen.. 1 Tab By Mouth Once Daily 9)  Colace 100 Mg Caps (Docusate Sodium) .Marland Kitchen.. 1 Tab By Mouth Two Times A Day 10)  Hydrocodone-Acetaminophen 2.5-500 Mg Tabs (Hydrocodone-Acetaminophen) .... Can Take 1 Pill Every 8 Hours As Needed 11)  Tramadol Hcl 50 Mg Tabs (Tramadol Hcl) .... Take 1/2 Tab By Mouth Q8 Hr As Needed For Shoulder Pain 12)  Januvia 50 Mg Tabs (Sitagliptin Phosphate) .... Once Daily 13)  Flonase 50 Mcg/act Susp (Fluticasone Propionate) .Marland Kitchen.. 1 Spray Each  Nostril Daily 14)  Calcium-Vitamin D 500-125 Mg-Unit Tabs (Calcium-Vitamin D) .... Tabs Tabs Once Daily 15)  Furosemide 40 Mg Tabs (Furosemide) .... One Pill Daily 16)  Vitamin B-12 100 Mcg Tabs (Cyanocobalamin) .Marland Kitchen.. 1 Tab Daily 17)  Folic Acid 1 Mg Tabs (Folic Acid) .Marland Kitchen.. 1 Tab Daily 18)  Theragran-M  Tabs (Multiple Vitamins-Minerals) .Marland Kitchen.. 1 Tab Daily 19)  Prednisone 20 Mg Tabs (Prednisone) .... Take 2 Tabs By Mouth Daily For Five Days 20)  Prodigy Blood Glucose Test  Strp (Glucose Blood) .... Use As Directed 21)  Prodigy Twist Top Lancets 28g  Misc (Lancets) .... Use As Directed 22)  Bd Disp Needles 25g X 5/8" Misc (Needle (Disp)) .... Use As Directed  Allergies (verified): No Known Drug Allergies  Review of Systems       denies fever, chills, nausea, vomiting, diarrhea or constipation   Physical Exam  General:  NAD vitals reviewed Eyes:  PERRLA, EOMI, some catarct seen b/l Mouth:  MMM, uvula midline Neck:  full ROM, no LAD Lungs:  Normal respiratory effort, chest expands  symmetrically. Lungs are clear to auscultation, no crackles or wheezes. Heart:  RRR 2/6 SEM stable Abdomen:  BS +, NT,, ND Extremities:  left 1st toe, erythema, tender to palpation mild swelling, no pooint tenderness no open sores or rash, mild left ankle swelling as well but nt  Skin:  mild discolration of left 1st toe due to swelling   Impression & Recommendations:  Problem # 1:  GOUT, UNSPECIFIED (ICD-274.9) 1st flare, due to ckd cannot use colchicine or naproxen, will treat with prednisone x 5 days.  Told pt likely will raise sugar but will decrease once stop taking it.  will get x-rays to r/o fracture.  will follow up in 2 weeks so we can get uric acid level to see if pt would be in need of allopurinol for ppx tx.  Orders: Radiology other (Radiology Other) Radiology other (Radiology Other) [facility]- Est  Level 4 (98119)  Problem # 2:  DIABETES MELLITUS, TYPE II (ICD-250.00) A1C higher then I would like it, consider increasing Januivia to avoid hypoglycemia events.  Cannot use metformin due to kidney dx.  Monitor every three months.  Will not change meds now due to pt likely having surgery and do not want to complicate his care further at this juncture. His updated medication list for this problem includes:    Aspir-low 81 Mg Tbec (Aspirin) .Marland Kitchen... 1 tab by mouth once daily    Glucotrol Xl 5 Mg Xr24h-tab (Glipizide) .Marland Kitchen... 1 tab by mouth once daily    Januvia 50 Mg Tabs (Sitagliptin phosphate) ..... Once daily  Orders: A1C-[facility] (14782) [facility]- Est  Level 4 (95621)  Labs Reviewed: Creat: 1.86 (02/01/2010)    Reviewed HgBA1c results: 7.6 (06/07/2010)  Problem # 3:  CHRONIC KIDNEY DISEASE UNSPECIFIED (ICD-585.9) has f/u appt within two weeks to see kidney dr.  Jarrett Ables: [facility]- Est  Level 4 (30865)  Problem # 4:  ARTHRITIS, LEFT SHOULDER (ICD-716.91) after clearance from CV and nephrology will have surgery.  Orders: [facility]- Est  Level 4 (99214)  Problem # 5:  ESSENTIAL HYPERTENSION, BENIGN  (ICD-401.1) controlled His updated medication list for this problem includes:    Toprol Xl 100 Mg Xr24h-tab (Metoprolol succinate) .Marland Kitchen... 1 tab by mouth once daily    Catapres-tts-1 0.1 Mg/24hr Ptwk (Clonidine hcl) .Marland Kitchen... 1 patch daily    Furosemide 40 Mg Tabs (Furosemide) ..... One pill daily  Orders: [facility]- Est  Level 4 (78469)  BP today: 128/75 Prior BP: 112/71 (04/16/2010)  Labs Reviewed: K+: 4.7 (02/01/2010) Creat: : 1.86 (02/01/2010)     Complete Medication List: 1)  Aspir-low 81 Mg Tbec (Aspirin) .Marland Kitchen.. 1 tab by mouth once daily 2)  Toprol Xl 100 Mg Xr24h-tab (Metoprolol succinate) .Marland Kitchen.. 1 tab by mouth once daily 3)  Glucotrol Xl 5 Mg Xr24h-tab (Glipizide) .Marland Kitchen.. 1 tab by mouth once daily 4)  Coumadin 5 Mg Tabs (Warfarin sodium) .... As directed 5)  Zocor 80 Mg Tabs (Simvastatin) .Marland Kitchen.. 1 tab by mouth at bedtime 6)  Sodium Bicarbonate 648 Mg Tabs (Sodium bicarbonate) .Marland Kitchen.. 1 tab by mouth once daily 7)  Catapres-tts-1 0.1 Mg/24hr Ptwk (Clonidine hcl) .Marland Kitchen.. 1 patch daily 8)  Ferro-bob 325 (65 Fe) Mg Tabs (Ferrous sulfate) .Marland Kitchen.. 1 tab by mouth once daily 9)  Colace 100 Mg Caps (Docusate sodium) .Marland Kitchen.. 1 tab by mouth two times a day 10)  Hydrocodone-acetaminophen 2.5-500 Mg Tabs (Hydrocodone-acetaminophen) .... Can take 1 pill every 8 hours as needed 11)  Tramadol Hcl 50 Mg Tabs (Tramadol hcl) .... Take 1/2 tab by mouth q8 hr as needed for shoulder pain 12)  Januvia 50 Mg Tabs (Sitagliptin phosphate) .... Once daily 13)  Flonase 50 Mcg/act Susp (Fluticasone propionate) .Marland Kitchen.. 1 spray each nostril daily 14)  Calcium-vitamin D 500-125 Mg-unit Tabs (Calcium-vitamin d) .... Tabs tabs once daily 15)  Furosemide 40 Mg Tabs (Furosemide) .... One pill daily 16)  Vitamin B-12 100 Mcg Tabs (Cyanocobalamin) .Marland Kitchen.. 1 tab daily 17)  Folic Acid 1 Mg Tabs (Folic acid) .Marland Kitchen.. 1 tab daily 18)  Theragran-m Tabs (Multiple vitamins-minerals) .Marland Kitchen.. 1 tab daily 19)  Prednisone 20 Mg Tabs (Prednisone) .... Take 2 tabs by  mouth daily for five days 20)  Prodigy Blood Glucose Test Strp (Glucose blood) .... Use as directed 21)  Prodigy Twist Top Lancets 28g Misc (Lancets) .... Use as directed 22)  Bd Disp Needles 25g X 5/8" Misc (Needle (disp)) .... Use as directed  Patient Instructions: 1)  I is great to see ou 2)  I think you have gout but would like to rule out fracture.  I want to get x-rays of that fott and toe.   3)  I will treat this like gout so I will use prednisone due to your chronic kidney disease.  Take 2 tabs by mouth daily for five days.  This will make your sugar go up so doin't be nervous.  Once your done with the medication it will go back down. 4)  I want to see you again in 2 weeks.  At that time we will get a blood test.   Prescriptions: BD DISP NEEDLES 25G X 5/8" MISC (NEEDLE (DISP)) use as directed  #200 x 6   Entered and Authorized by:   Antoine Primas DO   Signed by:   Antoine Primas DO on 06/07/2010   Method used:   Electronically to        [facility] [address] [address] (retail)       [address]       [place], [place]  [id number]       Ph: [phone]       Fax: [phone]   RxID:   [id number] PRODIGY TWIST TOP LANCETS 28G  MISC (LANCETS) use as directed  #200 x 6   Entered and Authorized by:   Antoine Primas DO   Signed by:   Antoine Primas DO on 06/07/2010   Method used:   Electronically to  [facility] (retail)       [address]       [place], [place]  [id number]       Ph: [phone]       Fax: [phone]   RxID:   [id number] PRODIGY BLOOD GLUCOSE TEST  STRP (GLUCOSE BLOOD) Use as directed  #200 x 6   Entered and Authorized by:   Antoine Primas DO   Signed by:   Antoine Primas DO on 06/07/2010   Method used:   Electronically to        [facility] [address] (retail)       [address]       [place], [place]  [id number]       Ph: [phone]       Fax: [phone]   RxID:   [id number] PREDNISONE  20 MG TABS (PREDNISONE) take 2 tabs by mouth daily for five days  #10 x 0   Entered and Authorized by:   Antoine Primas DO   Signed by:   Antoine Primas DO on 06/07/2010   Method used:   Electronically to        [facility] [address] (retail)       [address]       [place], [place]  [id number]       Ph: [phone]       Fax: [phone]   RxID:   [id number]   Laboratory Results   Blood Tests   Date/Time Received: June 07, 2010 2:36 PM  Date/Time Reported: June 07, 2010 3:04 PM   HGBA1C: 7.6%   (Normal Range: Non-Diabetic - 3-6%   Control Diabetic - 6-8%)  Comments: ...............test performed by......Marland KitchenBonnie A. [place], MLS (ASCP)cm      Prevention & Chronic Care Immunizations   Influenza vaccine: Not documented   Influenza vaccine due: 08/30/2010    Tetanus booster: Not documented    Pneumococcal vaccine: Not documented    H. zoster vaccine: Not documented  Colorectal Screening   Hemoccult: Not documented    Colonoscopy: Not documented   Colonoscopy action/deferral: Not indicated  (03/06/2010)   Colonoscopy due: 10/20/2014  Other Screening   PSA: Not documented   PSA action/deferral: Discussed-decision deferred  (02/01/2010)   Smoking status: never  (06/07/2010)  Diabetes Mellitus   HgbA1C: 7.6  (06/07/2010)    Eye exam: Not documented    Foot exam: Not documented   High risk foot: Not documented   Foot care education: Not documented   Foot exam due: 06/08/2011    Urine microalbumin/creatinine ratio: Not documented    Diabetes flowsheet reviewed?: Yes   Progress toward A1C goal: Improved  Lipids   Total Cholesterol: Not documented   LDL: Not documented   LDL Direct: Not documented   HDL: Not documented   Triglycerides: Not documented  Hypertension   Last Blood Pressure: 128 / 75  (06/07/2010)   Serum creatinine: 1.86  (02/01/2010)   Serum potassium 4.7  (02/01/2010)    Hypertension flowsheet reviewed?: Yes    Progress toward BP goal: At goal    Stage of readiness to change (hypertension management): Maintenance  Self-Management Support :    Diabetes self-management support: Not documented    Hypertension self-management support: Not documented

## 2011-01-29 NOTE — Progress Notes (Signed)
Summary: phn msg  Phone Note Call from Patient Call back at [facility] Phone [phone]   Caller: Patient Summary of Call: Cassandra returning Dr. Michaelle Copas call.  Said that he could just leave a message. Initial call taken by: Clydell Hakim,  June 18, 2010 1:36 PM  Follow-up for Phone Call        talked to Mr. Nearhood today and told him we will discuss more on thursday.   Follow-up by: Antoine Primas DO,  June 18, 2010 2:58 PM

## 2011-01-29 NOTE — Miscellaneous (Signed)
Summary: ROI  ROI   Imported By: Clydell Hakim 02/15/2010 15:24:31  _____________________________________________________________________  External Attachment:    Type:   Image     Comment:   External Document

## 2011-01-29 NOTE — Progress Notes (Signed)
Summary: phn msg  Phone Note From Other Clinic Call back at [phone]   Caller: [facility] Summary of Call: PT/INR 19.2 & 2.5 BS- 301 fasting not sure what is spiking BS- would like referral to DM & Nutrition Mgmt  Initial call taken by: De Nurse,  November 13, 2010 8:46 AM  Follow-up for Phone Call        Can we call kelley back and tell her to increase glucotrol to 10mg  daily monitor for shakes, sweating and if so then check blood sugar.  Also tell them we put in referral.  Thank you Follow-up by: Antoine Primas DO,  November 13, 2010 9:51 AM  Additional Follow-up for Phone Call Additional follow up Details #1::        LMOVM for Tresa Endo to call me back Additional Follow-up by: Jone Baseman CMA,  November 13, 2010 10:51 AM    Additional Follow-up for Phone Call Additional follow up Details #2::    Tresa Endo informed of above, will recheck PT/INR in 1week. Follow-up by: Garen Grams LPN,  November 13, 2010 11:48 AM  New/Updated Medications: GLUCOTROL XL 5 MG XR24H-TAB (GLIPIZIDE) 2 tab by mouth once daily Prescriptions: GLUCOTROL XL 5 MG XR24H-TAB (GLIPIZIDE) 2 tab by mouth once daily  #186 x 1   Entered and Authorized by:   Antoine Primas DO   Signed by:   Antoine Primas DO on 11/13/2010   Method used:   Electronically to        [facility] (retail)       [address]       [place], [place]  [id number]       Ph: [phone]       Fax: [phone]   RxID:   [id number]

## 2011-01-29 NOTE — Progress Notes (Signed)
  Phone Note Outgoing Call   Call placed by: Scherrie Bateman, LPN,  June 21, 2010 11:39 AM Call placed to: Patient Summary of Call: CALLED PT N/A X1 NEED TO INFORM TO STOP ZOCOR AND START PRAVACHOL 40 MG PER DR NISHAN.  Initial call taken by: Scherrie Bateman, LPN,  June 21, 2010 11:48 AM  Follow-up for Phone Call        PT AWARE./CY Follow-up by: Scherrie Bateman, LPN,  June 21, 2010 1:22 PM    New/Updated Medications: PRAVASTATIN SODIUM 40 MG TABS (PRAVASTATIN SODIUM) 1 once daily  40 MG PER DR NISHAN.  Initial call taken by: Scherrie Bateman, LPN,  June 21, 2010 11:48 AM    Prescriptions: PRAVASTATIN SODIUM 40 MG TABS (PRAVASTATIN SODIUM) 1 once daily  #30 x 11   Entered by:   Scherrie Bateman, LPN   Authorized by:   Colon Branch, MD, [facility]   Signed by:   Scherrie Bateman, LPN on 16/09/9603   Method used:   Electronically to        [facility] [address] (retail)       [address]       [place], [place]  [id number]       Ph: [phone]       Fax: [phone]   RxID:   [id number]

## 2011-01-29 NOTE — Miscellaneous (Signed)
Summary: walk in  Clinical Lists Changes walked in with dtr? asking for prednisone for his gout. told him he will need to be seen for this. he only wanted his md. has appt later in month with pcp. md is not available sooner. 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 sign language interpretor arranged. advised tylenol/asa or motrin-whatever has worked in the past. they were unable to stay today. I told him we could see him now. they had other things to do & could not stay.Golden Circle RN  July 12, 2010 2:34 PM

## 2011-01-29 NOTE — Assessment & Plan Note (Signed)
Summary: f/u,df   Vital Signs:  Patient profile:   77 year old male Height:      64.5 inches Weight:      146.5 pounds BMI:     24.85 Temp:     98.4 degrees F oral Pulse rate:   55 / minute BP sitting:   170 / 86  (left arm) Cuff size:   regular  Vitals Entered By: Garen Grams LPN (July 19, 2010 3:47 PM) CC: f/u BP, gout Is Patient Diabetic? Yes Did you bring your meter with you today? No Pain Assessment Patient in pain? no        Primary Care Provider:  Antoine Primas DO  CC:  f/u BP and gout.  History of Present Illness: 77 yo male here for multiple problems  1.  recent EGD found to have Barrets esophagus needs a repeat EGD in 1 year from 6/11.   Pt started on omeprazole and is doing well  2.  HTN- Pt has had multiple readings of high blood pressure at different Dr. offices.  pt in the course of the visit did finally state that he is eating a little more salt and was stopped on his lasix due to his CKD. Pt though denies any headache, visual changes abdominal pain or cp.    3.  DM-  Better control recently.  Pt blood sugars 100-130.  No recent changes.   4.  Gout-  Pt uric acid normal knows that seafood and red meat seem to be the trigger.  Prednisone does help.  Pt given choice of another medication or avoiding triggers, he wants to avoid trigger.    Habits & Providers  Alcohol-Tobacco-Diet     Tobacco Status: never  Allergies: No Known Drug Allergies  Social History: Smoking Status:  never  Physical Exam  General:  Well-developed,well-nourished,in no acute distress; alert,appropriate and cooperative throughout examination Eyes:  PERRLA, EOMI, some catarct seen b/l Mouth:  MMM, uvula midline Lungs:  Normal respiratory effort, chest expands symmetrically. Lungs are clear to auscultation, no crackles or wheezes. Heart:  RRR 2/6 SEM stable Abdomen:  BS +, NT,, ND Msk:  shoulder still have limited range of motion on left, still causes pain.  Pulses:   2+ Extremities:  left MTP joint is tender, no erythema or swellin trace edema. .  Neurologic:  CN 2-12 intact   Impression & Recommendations:  Problem # 1:  GOUT, UNSPECIFIED (ICD-274.9) Assessment Improved Will avoid triggers.  Pt cannot due colchicine or allopurinol due to CKD, then the only other one possible would be  uloric.  Pt though declined for now. will see how long til next flare. Told salmon is the best fish if he needs to have seafood.  Orders: [facility]- Est  Level 4 (04540)  Problem # 2:  ESSENTIAL HYPERTENSION, BENIGN (ICD-401.1) Will increase clonidine patch and see if improves. Pt may need to have half his lasix started again.  Should check and see if holiday has helped his creatnine, if not then will start lasix again if needed.  The following medications were removed from the medication list:    Catapres-tts-1 0.1 Mg/24hr Ptwk (Clonidine hcl) .Marland Kitchen... 1 patch daily His updated medication list for this problem includes:    Toprol Xl 100 Mg Xr24h-tab (Metoprolol succinate) .Marland Kitchen... 1 tab by mouth once daily    Furosemide 40 Mg Tabs (Furosemide) ..... One pill daily    Catapres-tts-2 0.2 Mg/24hr Ptwk (Clonidine hcl) .Marland Kitchen... Place on skin and change weekly  Orders: [facility]- Est  Level 4 ([address])  Problem # 3:  ? of SLE (ICD-710.0) Do not know.  ANA + dsDNA and anti Damare Serano negative The following medications were removed from the medication list:    Prednisone 20 Mg Tabs (Prednisone) .Marland Kitchen... Take 2 tablets a day for 5 days. His updated medication list for this problem includes:    Aspir-low 81 Mg Tbec (Aspirin) .Marland Kitchen... 1 tab by mouth once daily  Orders: Rheumatology Referral (Rheumatology)  Problem # 4:  ANA POSITIVE (ICD-790.99) With ppattern considering mixed connective tissue disease. Will send to rheum.  also would like advice as possible need to continue wafarin since hx of portal vein thrombosis.   Orders: Rheumatology Referral (Rheumatology)  Problem # 5:  BARRETTS ESOPHAGUS  (ICD-530.85) omeprazole. will continue.  Will get repeat EGD in 1 year.  Orders: [facility]- Est  Level 4 (99214)  Complete Medication List: 1)  Aspir-low 81 Mg Tbec (Aspirin) .Marland Kitchen.. 1 tab by mouth once daily 2)  Toprol Xl 100 Mg Xr24h-tab (Metoprolol succinate) .Marland Kitchen.. 1 tab by mouth once daily 3)  Glucotrol Xl 5 Mg Xr24h-tab (Glipizide) .Marland Kitchen.. 1 tab by mouth once daily 4)  Coumadin 5 Mg Tabs (Warfarin sodium) .... As directed 5)  Pravastatin Sodium 40 Mg Tabs (Pravastatin sodium) .Marland Kitchen.. 1 once daily 6)  Ferro-bob 325 (65 Fe) Mg Tabs (Ferrous sulfate) .Marland Kitchen.. 1 tab by mouth once daily 7)  Colace 100 Mg Caps (Docusate sodium) .Marland Kitchen.. 1 tab by mouth two times a day 8)  Januvia 50 Mg Tabs (Sitagliptin phosphate) .... Once daily 9)  Calcium-vitamin D 500-125 Mg-unit Tabs (Calcium-vitamin d) .Marland Kitchen.. 1 two times a day 10)  Furosemide 40 Mg Tabs (Furosemide) .... One pill daily 11)  Vitamin B-12 100 Mcg Tabs (Cyanocobalamin) .Marland Kitchen.. 1 tab daily 12)  Folic Acid 1 Mg Tabs (Folic acid) .Marland Kitchen.. 1 tab daily 13)  Theragran-m Tabs (Multiple vitamins-minerals) .Marland Kitchen.. 1 tab daily 14)  Prodigy Blood Glucose Test Strp (Glucose blood) .... Use as directed 15)  Prodigy Twist Top Lancets 28g Misc (Lancets) .... Use as directed 16)  Bd Disp Needles 25g X 5/8" Misc (Needle (disp)) .... Use as directed 17)  Omeprazole 20 Mg Cpdr (Omeprazole) .... Take 1 tab daily 18)  Calcitriol 0.25 Mcg Caps (Calcitriol) .... Take 1 tab by mouth moday, wed, friday. 19)  Catapres-tts-2 0.2 Mg/24hr Ptwk (Clonidine hcl) .... Place on skin and change weekly  Patient Instructions: 1)  Good to see you 2)  I am going to increase your catapress to 0.2mg  patch, still change it weekly.   3)  I will have your nurse kelly check your blood pressure at home  4)  For your gout try to stay away from seafood (sorry).  5)  I am sending you to a rheumatologist to be evaluated.  6)  Also, make an appointment with pharmacy clinic to see if we can improve any of your  medications for blood pressure or for diabetes or if anything is interacting to cause your blood pressure to go up.  Dr. Raymondo Band is our pharmacist.  Bonita Quin can make an appointment at the front desk.  7)  You should probably see me again in the next 2 months so we can check on  your diabetes again Prescriptions: CATAPRES-TTS-2 0.2 MG/24HR PTWK (CLONIDINE HCL) place on skin and change weekly  #16 x 3   Entered and Authorized by:   Antoine Primas DO   Signed by:   Antoine Primas DO on 07/19/2010  Method used:   Electronically to        [facility] [address] (retail)       [address]       [place], [place]  [id number]       Ph: [phone]       Fax: [phone]   RxID:   [id number] CALCITRIOL 0.25 MCG CAPS (CALCITRIOL) take 1 tab by mouth moday, wed, friday.  #15 x 0   Entered and Authorized by:   Antoine Primas DO   Signed by:   Antoine Primas DO on 07/19/2010   Method used:   Historical   RxID:   [id number]

## 2011-01-29 NOTE — Progress Notes (Signed)
Summary: phn msg  Phone Note Other Incoming Call back at [phone]   Caller: [facility] Summary of Call: PT INR 2.5 & 19.3  Philip Matthews forgot to take his coumdin 7.5 last night.  His rheumatologist has started him on predisone 5 mg every day until reseen by him.   Initial call taken by: Clydell Hakim,  August 14, 2010 9:35 AM  Follow-up for Phone Call        doing well, continue same dosing, pt had gout again and was placed on prednisone, will need uloric at next visit.  Follow-up by: Antoine Primas DO,  August 15, 2010 3:16 PM

## 2011-01-29 NOTE — Progress Notes (Signed)
  Phone Note Other Incoming Call back at [phone]   Summary of Call: Chestine Spore calling about Mr. Stailey's coumadin.  The eye doctor held is coumadin for a week for eye surgery know.  Checked inr and it 1.2.  Call back and let Tresa Endo know when to recheck coumadin. Initial call taken by: Abundio Miu,  September 25, 2010 9:12 AM  Follow-up for Phone Call        called kelly back told her to recheck again next week, start same regimen.  Follow-up by: Antoine Primas DO,  September 25, 2010 10:43 AM

## 2011-01-29 NOTE — Progress Notes (Signed)
Summary: meds questions  Phone Note From Other Clinic Call back at [phone]   [facility]: needs to update meds from Dr Donia Pounds office also has questions about Placquinil & Calcitril & Sodium bicarb Initial call taken by: De Nurse,  May 01, 2010 9:07 AM  Follow-up for Phone Call        Tresa Endo, rn from [facility]  updated meds this am while in home.  the meds we have were from last pcp.  new meds are Januvia 50 mg once daily, Flonase two times a day, Folic acid once daily, Lasix 40mg  once daily, Theragram M MTV once daily, B12 once daily, Calcium with D 500mg  two times a day. wants [place] to add these to his med list  meds she is questioning: since he does not have Lupus, can we D/C Plaqinil? Calcitril is not covered by medicaid. change it? Still need sodium bicarb?  plz call her if you have questions. told her I will send to pcp Follow-up by: Golden Circle RN,  May 01, 2010 9:11 AM  Additional Follow-up for Phone Call Additional follow up Details #1::        called kelly told her to d/c plaqunil and we will try  if symptoms return will restart.  ANA neg. Cut Na bicarb in half calcitriolI would not stop, can't change ask nephrologist for any alternatives.   Additional Follow-up by: Antoine Primas DO,  May 03, 2010 1:45 PM    New/Updated Medications: SODIUM BICARBONATE 648 MG TABS (SODIUM BICARBONATE) 1 tab by mouth once daily JANUVIA 50 MG TABS (SITAGLIPTIN PHOSPHATE) once daily FLONASE 50 MCG/ACT SUSP (FLUTICASONE PROPIONATE) 1 spray each nostril daily CALCIUM-VITAMIN D 500-125 MG-UNIT TABS (CALCIUM-VITAMIN D) tabs tabs once daily FUROSEMIDE 40 MG TABS (FUROSEMIDE) one pill daily VITAMIN B-12 100 MCG TABS (CYANOCOBALAMIN) 1 tab daily FOLIC ACID 1 MG TABS (FOLIC ACID) 1 tab daily THERAGRAN-M  TABS (MULTIPLE VITAMINS-MINERALS) 1 tab daily Prescriptions: THERAGRAN-M  TABS (MULTIPLE VITAMINS-MINERALS) 1 tab daily  #31 x 11   Entered and Authorized by:    Antoine Primas DO   Signed by:   Antoine Primas DO on 05/03/2010   Method used:   Historical   RxID:   [id number] FOLIC ACID 1 MG TABS (FOLIC ACID) 1 tab daily  #31 x 10   Entered and Authorized by:   Antoine Primas DO   Signed by:   Antoine Primas DO on 05/03/2010   Method used:   Historical   RxID:   [id number] SODIUM BICARBONATE 648 MG TABS (SODIUM BICARBONATE) 1 tab by mouth once daily  #31 x 10   Entered and Authorized by:   Antoine Primas DO   Signed by:   Antoine Primas DO on 05/03/2010   Method used:   Historical   RxID:   [id number] VITAMIN B-12 100 MCG TABS (CYANOCOBALAMIN) 1 tab daily  #31 x 10   Entered and Authorized by:   Antoine Primas DO   Signed by:   Antoine Primas DO on 05/03/2010   Method used:   Historical   RxID:   [id number] FUROSEMIDE 40 MG TABS (FUROSEMIDE) one pill daily  #31 x 10   Entered and Authorized by:   Antoine Primas DO   Signed by:   Antoine Primas DO on 05/03/2010   Method used:   Historical   RxID:   [id number] CALCIUM-VITAMIN D 500-125 MG-UNIT TABS (CALCIUM-VITAMIN D) tabs tabs once daily  #62 x 10  Entered and Authorized by:   Antoine Primas DO   Signed by:   Antoine Primas DO on 05/03/2010   Method used:   Historical   RxID:   [id number] FLONASE 50 MCG/ACT SUSP (FLUTICASONE PROPIONATE) 1 spray each nostril daily  #1 x 9   Entered and Authorized by:   Antoine Primas DO   Signed by:   Antoine Primas DO on 05/03/2010   Method used:   Historical   RxID:   [id number] JANUVIA 50 MG TABS (SITAGLIPTIN PHOSPHATE) once daily  #31 x 10   Entered and Authorized by:   Antoine Primas DO   Signed by:   Antoine Primas DO on 05/03/2010   Method used:   Historical   RxID:   [id number]

## 2011-01-29 NOTE — Miscellaneous (Signed)
Summary: Coumadin  Clinical Lists Changes using interpretor for hard of hearing people, I called pt to let him know his coumadin levels are too high & he needs to skip 2 days/doses. resume normal dose after that. I had to leave a message as he did not pick up the phone. the Home health rn will need to recheck in 1 wk & fax results to [place]. faxed the coumadin order back to [facility] [phone] (Dr. Allena Katz) fax [phone] . also called [facility]. the number we had for [facility], his nurse would not pick up. I faxed the orders to [facility] & they states they will make sure Tresa Endo gets them. to pcp.Golden Circle RN  June 12, 2010 3:47 PM  spoke with pt using relay system. he understands to not take the coumadin tonight or tmorrow, then resume. he knows the rn will check his blood in 1 wk.Golden Circle RN  June 12, 2010 4:43 PM

## 2011-01-29 NOTE — Consult Note (Signed)
Summary: [facility]   Imported By: Clydell Hakim 09/12/2010 16:11:39  _____________________________________________________________________  External Attachment:    Type:   Image     Comment:   External Document

## 2011-01-29 NOTE — Assessment & Plan Note (Signed)
Summary: np3/clearance for shoulder surgery/jml   Primary Provider:  Antoine Primas DO   History of Present Illness: Patient rescheduled as he is deaf and no interpretor was available  Allergies: No Known Drug Allergies

## 2011-01-29 NOTE — Progress Notes (Signed)
Summary: orders  Phone Note From Other Clinic Call back at [phone]   Caller: [facility] Summary of Call: would like verbal order for a cane Initial call taken by: De Nurse,  November 20, 2010 4:16 PM  Follow-up for Phone Call        verbal order for quad cane.  Follow-up by: Antoine Primas DO,  November 20, 2010 4:42 PM  Additional Follow-up for Phone Call Additional follow up Details #1::        Left message for Annabelle Harman giving a verbal ok for quad cane. Additional Follow-up by: Garen Grams LPN,  November 21, 2010 4:29 PM

## 2011-01-29 NOTE — Progress Notes (Signed)
Summary: phn msg  Phone Note From Other Clinic Call back at [phone]   [facility]: pt is having other eye operated on either tomorrow or Wednesday and has held coumidin for 5 days - INR will not be checked tomorrow pls let her know that rec'd message and will let doc know when surgery is. Initial call taken by: De Nurse,  October 22, 2010 2:12 PM

## 2011-01-29 NOTE — Progress Notes (Signed)
Summary: Tresa Endo from [facility]  Phone Note From Other Clinic Call back at [phone]   Caller: Tresa Endo from [facility] Summary of Call: Tresa Endo sts that pts INR was 1.5 last week and today it is 3.7.  He has been taking 40mg  prednisone since friday and today is his last dose. He is taking coumadin 7.5mg  Saturday, sunday, monday, wednesday, friday and 5mg  tuesday and thursday.  Tresa Endo would like to know how to dose his coumadin.  MD paged Initial call taken by: Jone Baseman CMA,  July 17, 2010 8:54 AM  Follow-up for Phone Call        Precepted with Dr.Jordan.  She wants pt to take 2.5mg  tonight and then continue regular regimen.  Needs INR rechecked in 1 week.  Tresa Endo informed and agreeable with the above. Follow-up by: Jone Baseman CMA,  July 17, 2010 9:08 AM     Appended Document: Tresa Endo from [facility] language resources [phone] interpreter number) called to get more information for pt.

## 2011-01-29 NOTE — Progress Notes (Signed)
Summary: PT/INR  Phone Note Other Incoming Call back at [phone]   Summary of Call: Philip Matthews with [facility] need number for INR level to give Philip Matthews is meds.  Please call her at  Initial call taken by: Abundio Miu,  October 09, 2010 8:37 AM  Follow-up for Phone Call        Patients PT/INR for this week was 2.3 and 18.6, wants to know what to do for his meds. Follow-up by: Garen Grams LPN,  October 09, 2010 8:48 AM  Additional Follow-up for Phone Call Additional follow up Details #1::        Per MD, Philip Matthews is to go back to regular schedule. Philip Matthews states she will resume 7.5mg  on Sun, M, W, F, Sat and 5mg  on T, Th. Then recheck in two weeks. Additional Follow-up by: Garen Grams LPN,  October 09, 2010 8:59 AM

## 2011-01-29 NOTE — Miscellaneous (Signed)
Summary: call from [facility]  Clinical Lists Changes received call from [facility] nurse with [facility]. she checked PT / INR today with reading of INR 5.7 and Protime 29 seconds.  paged Dr. Katrinka Blazing and he advises to hold coumadin for 2 days then resume . recheck protime next Tuesday 12/04/2010. Kelly notified. Theresia Lo RN  November 27, 2010 9:10 AM

## 2011-01-29 NOTE — Assessment & Plan Note (Signed)
Summary: FU/KH   Vital Signs:  Patient profile:   77 year old male Height:      64.5 inches Weight:      140 pounds BMI:     23.75 Temp:     97.8 degrees F oral Pulse rate:   63 / minute BP sitting:   167 / 81  (left arm) Cuff size:   regular  Vitals Entered By: Garen Grams LPN (March 06, 1477 3:18 PM) CC: f/u last visit Is Patient Diabetic? No Pain Assessment Patient in pain? no        Primary Care Provider:  Antoine Primas DO  CC:  f/u last visit.  History of Present Illness: Pt is here for f/u for mutliple problems 1.  BP. Elevated at 167/81 today.  Pt is still on meds.  Denies HA, abdominal pain numbness or tingling in any extremities.  HR 63.  On toprol and catapress.  At one point was on CCB in the past, pt does not know why he stopped that.    2.  Shoulder pain.  Left shoulder still giving problems, shot did help some but then pain returned.  Pt is starting PT this week.  Will do two times a week for four weeks.  Has home exercises as well. Tylenol helps some but not all the way.  Does want to avoid surgery  3.  DM- Sugars have been good in the 120's, 130's at home.  Only on glipizide 5 mg.  Told to avoid metformin due to KIDNEY ISSUES  4.  CKD stage 3-  Pt is doing well, urinating still fine without hesitation.  Does need referral to new nephrologist for his kidney dx.  Just received his record from [facility] and will look through soon for more information.    5.  SLE- on plaquenil, needs a refill.  No problems no flare.    Habits & Providers  Alcohol-Tobacco-Diet     Tobacco Status: never  Current Medications (verified): 1)  Aspir-Low 81 Mg Tbec (Aspirin) .Marland Kitchen.. 1 Tab By Mouth Once Daily 2)  Toprol Xl 100 Mg Xr24h-Tab (Metoprolol Succinate) .Marland Kitchen.. 1 Tab By Mouth Once Daily 3)  Glucotrol Xl 5 Mg Xr24h-Tab (Glipizide) .Marland Kitchen.. 1 Tab By Mouth Once Daily 4)  Coumadin 5 Mg Tabs (Warfarin Sodium) .... As Directed 5)  Zocor 80 Mg Tabs (Simvastatin) .Marland Kitchen.. 1 Tab By Mouth At  Bedtime 6)  Sodium Bicarbonate 648 Mg Tabs (Sodium Bicarbonate) .Marland Kitchen.. 1 Tab By Mouth Once Daily 7)  Catapres-Tts-1 0.1 Mg/24hr Ptwk (Clonidine Hcl) .Marland Kitchen.. 1 Patch Daily 8)  Ferro-Bob 325 (65 Fe) Mg Tabs (Ferrous Sulfate) .Marland Kitchen.. 1 Tab By Mouth Once Daily 9)  Plaquenil 200 Mg Tabs (Hydroxychloroquine Sulfate) .Marland Kitchen.. 1 Tab By Mouth Once Daily 10)  Colace 100 Mg Caps (Docusate Sodium) .Marland Kitchen.. 1 Tab By Mouth Two Times A Day 11)  Hydrocodone-Acetaminophen 2.5-500 Mg Tabs (Hydrocodone-Acetaminophen) .... Can Take 1 Pill Every 8 Hours As Needed  Allergies (verified): No Known Drug Allergies  Past History:  Past medical, surgical, family and social histories (including risk factors) reviewed, and no changes noted (except as noted below).  Past Medical History: HTN, T2DM, anemia, ckd, Pt is deaf and talks with translator, ? hx of lupus still need results from [facility] ? of ulcers  Past Surgical History: Reviewed history from 02/01/2010 and no changes required. Right shoulder surgery  remote hx Pt has abdominal scar, likely hernia repair Flu shot in 10/10 Colonoscopy in 2010  Family  History: Reviewed history from 02/01/2010 and no changes required. Heart dx in mother DM in both parents Prostate cancer in father  Social History: Reviewed history from 02/01/2010 and no changes required. Lives with Daughter Philip Matthews), who is also his POA no smoke, no drink, no illicits Like to go to nusing homes and do activities with peers.    Review of Systems       denies fever, chills, nausea, vomiting, diarrhea or constipation   Physical Exam  General:  NAD Eyes:  PERRLA, EOMI, some catarct seen b/l Mouth:  MMM, uvula midline Neck:  full ROM, no LAD Lungs:  Normal respiratory effort, chest expands symmetrically. Lungs are clear to auscultation, no crackles or wheezes. Heart:  RRR 2/6 SEM stable Abdomen:  BS +, NT,, ND Extremities:  Left shoulder, decrease ROM in all planes, imporved from last  visit still worse with internal rotation, could not bring arm to 90 flexion or extention instaed about 60 degrees.  + crepitus, no radiation of pain stay localized on joint space.   Right shoulder incision from previous surgery.  Better ROM but still not full minimal crepitus LE no edema Neurologic:  2-12 intact   Impression & Recommendations:  Problem # 1:  ESSENTIAL HYPERTENSION, BENIGN (ICD-401.1) Assessment Deteriorated Will consider adding amlodipine at next visit.  Was control previously His updated medication list for this problem includes:    Toprol Xl 100 Mg Xr24h-tab (Metoprolol succinate) .Marland Kitchen... 1 tab by mouth once daily    Catapres-tts-1 0.1 Mg/24hr Ptwk (Clonidine hcl) .Marland Kitchen... 1 patch daily  Orders: [facility]- Est  Level 4 (16109)  Problem # 2:  ARTHRITIS, LEFT SHOULDER (ICD-716.91) Assessment: Improved Gave vicodin due to pain.  Gave warning signs.  Told not to take with tylenol.  Will continue PT and exercises.  See in 4 weeks.  If still bad consider another injection.   Orders: [facility]- Est  Level 4 (60454)  Problem # 3:  CHRONIC KIDNEY DISEASE UNSPECIFIED (ICD-585.9) Assessment: Unchanged Will go through old records.  Send to nephrologist to establish care.  Creatinine baseline 1.8 Orders: Nephrology Referral (Nephro) [facility]- Est  Level 4 (09811)  Problem # 4:  DIABETES MELLITUS, TYPE II (ICD-250.00) Assessment: Unchanged Will get A1C at next Visit, do foot exam.   His updated medication list for this problem includes:    Aspir-low 81 Mg Tbec (Aspirin) .Marland Kitchen... 1 tab by mouth once daily    Glucotrol Xl 5 Mg Xr24h-tab (Glipizide) .Marland Kitchen... 1 tab by mouth once daily  Orders: [facility]- Est  Level 4 (91478)  Problem # 5:  ? of SLE (ICD-710.0) Assessment: Unchanged no flares His updated medication list for this problem includes:    Aspir-low 81 Mg Tbec (Aspirin) .Marland Kitchen... 1 tab by mouth once daily    Plaquenil 200 Mg Tabs (Hydroxychloroquine sulfate) .Marland Kitchen... 1 tab by mouth once  daily  Complete Medication List: 1)  Aspir-low 81 Mg Tbec (Aspirin) .Marland Kitchen.. 1 tab by mouth once daily 2)  Toprol Xl 100 Mg Xr24h-tab (Metoprolol succinate) .Marland Kitchen.. 1 tab by mouth once daily 3)  Glucotrol Xl 5 Mg Xr24h-tab (Glipizide) .Marland Kitchen.. 1 tab by mouth once daily 4)  Coumadin 5 Mg Tabs (Warfarin sodium) .... As directed 5)  Zocor 80 Mg Tabs (Simvastatin) .Marland Kitchen.. 1 tab by mouth at bedtime 6)  Sodium Bicarbonate 648 Mg Tabs (Sodium bicarbonate) .Marland Kitchen.. 1 tab by mouth once daily 7)  Catapres-tts-1 0.1 Mg/24hr Ptwk (Clonidine hcl) .Marland Kitchen.. 1 patch daily 8)  Ferro-bob 325 (65 Fe) Mg Tabs (  Ferrous sulfate) .Marland Kitchen.. 1 tab by mouth once daily 9)  Plaquenil 200 Mg Tabs (Hydroxychloroquine sulfate) .Marland Kitchen.. 1 tab by mouth once daily 10)  Colace 100 Mg Caps (Docusate sodium) .Marland Kitchen.. 1 tab by mouth two times a day 11)  Hydrocodone-acetaminophen 2.5-500 Mg Tabs (Hydrocodone-acetaminophen) .... Can take 1 pill every 8 hours as needed  Patient Instructions: 1)  It is great to see you 2)  I want you to take your medicines as they were prescribed 3)  I am giving you a new pill.  This pill has tylenol in it so do NOT take with tylenol 4)  Please continue physical therapy and the exercises at home.  I think this will help.  If it is still hurting we can try another injection at after all the therapy.  If that doesn't help then we need to talk to an orthopadic doctor.   5)  I will refer you to a kidney doctor.  They should be calling you soon for an appointment. 6)  I want to see you in 4-6 weeks to see how therapy is going and to check your blood pressure again. Prescriptions: HYDROCODONE-ACETAMINOPHEN 2.5-500 MG TABS (HYDROCODONE-ACETAMINOPHEN) Can take 1 pill every 8 hours as needed  #90 x 0   Entered and Authorized by:   Philip Primas DO   Signed by:   Philip Primas DO on 03/06/2010   Method used:   Print then Give to Patient   RxID:   [id number] PLAQUENIL 200 MG TABS (HYDROXYCHLOROQUINE SULFATE) 1 tab by mouth once daily   #32 x 10   Entered and Authorized by:   Philip Primas DO   Signed by:   Philip Primas DO on 03/06/2010   Method used:   Electronically to        [facility] (retail)       [address]       [place], [place]  [id number]       Ph: [phone]       Fax: [phone]   RxID:   [id number]   Prevention & Chronic Care Immunizations   Influenza vaccine: Not documented   Influenza vaccine due: 08/30/2010    Tetanus booster: Not documented    Pneumococcal vaccine: Not documented    H. zoster vaccine: Not documented  Colorectal Screening   Hemoccult: Not documented    Colonoscopy: Not documented   Colonoscopy action/deferral: Not indicated  (03/06/2010)   Colonoscopy due: 10/20/2014  Other Screening   PSA: Not documented   PSA action/deferral: Discussed-decision deferred  (02/01/2010)   Smoking status: never  (03/06/2010)  Diabetes Mellitus   HgbA1C: Not documented    Eye exam: Not documented    Foot exam: Not documented   High risk foot: Not documented   Foot care education: Not documented    Urine microalbumin/creatinine ratio: Not documented    Diabetes flowsheet reviewed?: Yes    Stage of readiness to change (diabetes management): Preparation  Lipids   Total Cholesterol: Not documented   LDL: Not documented   LDL Direct: Not documented   HDL: Not documented   Triglycerides: Not documented  Hypertension   Last Blood Pressure: 167 / 81  (03/06/2010)   Serum creatinine: 1.86  (02/01/2010)   Serum potassium 4.7  (02/01/2010)    Hypertension flowsheet reviewed?: Yes   Progress toward BP goal: Deteriorated  Self-Management Support :    Diabetes self-management support: Not documented    Hypertension self-management support: Not documented

## 2011-01-29 NOTE — Assessment & Plan Note (Signed)
Summary: follow up elevated BS/ls   Vital Signs:  Patient profile:   77 year old male Height:      64.5 inches Weight:      148.1 pounds BMI:     25.12 Temp:     98.0 degrees F oral Pulse rate:   63 / minute BP sitting:   141 / 75  (left arm) Cuff size:   regular  Vitals Entered By: Garen Grams LPN (November 14, 2010 2:28 PM) CC: f/u blood sugars Is Patient Diabetic? Yes Did you bring your meter with you today? No   Primary Care Provider:  Antoine Primas DO  CC:  f/u blood sugars.  History of Present Illness: 77 yo male with multiple medical problems and sees many specialists here for f/u on   1. DM: glucose low:89 Glucose high:451 Last A1C: 10.8 Taking Meds:yes just increased glucotrol to 10mg  daily.  On INsulin:no Side effects:no  ROS: Denies polyuria, polydipsia, visual changes numbness in extremities, foot ulcers Lifestyle modifications:no  Pt now stopped prednisone and allopurinol today.    2.  BP today: 141/75 Taking Meds:yes had amlodipine added by nephrologist.  Side Effects:no ROS: denies Headache visual changes nausea vomiting abdominal pain numbness in extremities    3. Gout:Pt has stopped his prednisone now and will stop his allopurinol due to pt CKD.  Nursing staff called pt insurence (see phone note) states uloric should be covered.  Will try uloric.  No pain no flare at this time.    Habits & Providers  Alcohol-Tobacco-Diet     Tobacco Status: never  Current Medications (verified): 1)  Aspir-Low 81 Mg Tbec (Aspirin) .Marland Kitchen.. 1 Tab By Mouth Once Daily 2)  Toprol Xl 100 Mg Xr24h-Tab (Metoprolol Succinate) .Marland Kitchen.. 1 Tab By Mouth Once Daily 3)  Glucotrol Xl 5 Mg Xr24h-Tab (Glipizide) .... 2 Tab By Mouth Once Daily 4)  Coumadin 5 Mg Tabs (Warfarin Sodium) .... As Directed 5)  Pravastatin Sodium 40 Mg Tabs (Pravastatin Sodium) .Marland Kitchen.. 1 Once Daily 6)  Ferro-Bob 325 (65 Fe) Mg Tabs (Ferrous Sulfate) .Marland Kitchen.. 1 Tab By Mouth Once Daily 7)  Colace 100 Mg Caps (Docusate  Sodium) .Marland Kitchen.. 1 Tab By Mouth Two Times A Day 8)  Januvia 50 Mg Tabs (Sitagliptin Phosphate) .... Once Daily 9)  Calcium-Vitamin D 500-125 Mg-Unit Tabs (Calcium-Vitamin D) .Marland Kitchen.. 1 Two Times A Day 10)  Furosemide 40 Mg Tabs (Furosemide) .... One Pill Daily 11)  Vitamin B-12 100 Mcg Tabs (Cyanocobalamin) .Marland Kitchen.. 1 Tab Daily 12)  Folic Acid 1 Mg Tabs (Folic Acid) .Marland Kitchen.. 1 Tab Daily 13)  Theragran-M  Tabs (Multiple Vitamins-Minerals) .Marland Kitchen.. 1 Tab Daily 14)  Prodigy Blood Glucose Test  Strp (Glucose Blood) .... Use As Directed 15)  Prodigy Twist Top Lancets 28g  Misc (Lancets) .... Use As Directed 16)  Bd Disp Needles 25g X 5/8" Misc (Needle (Disp)) .... Use As Directed 17)  Omeprazole 20 Mg Cpdr (Omeprazole) .... Take 1 Tab Daily 18)  Calcitriol 0.25 Mcg Caps (Calcitriol) .... Take 1 Tab By Mouth Moday, Wed, Friday. 19)  Catapres-Tts-2 0.2 Mg/24hr Ptwk (Clonidine Hcl) .... Place On Skin and Change Weekly 20)  Amlodipine Besylate 5 Mg Tabs (Amlodipine Besylate) .Marland Kitchen.. 1 Tab Daily 21)  Sodium Bicarbonate 648 Mg Tabs (Sodium Bicarbonate) 22)  Prodigy Pocket Pro Blood Gluco W/device Kit (Blood Glucose Monitoring Suppl) .... Use As Directed 23)  Uloric 40 Mg Tabs (Febuxostat) .... Take 1 Tab Daily  Allergies (verified): No Known Drug Allergies  Past History:  Past medical, surgical, family and social histories (including risk factors) reviewed, and no changes noted (except as noted below).  Past Medical History: Reviewed history from 05/30/2010 and no changes required. HTN, T2DM, anemia, ckd, Pt is deaf and talks with translator, ? hx of lupus still need results from [facility] ? of ulcers Lung nodule stable Liver nodle stable- likely should f/u periodically DIVERTICULOSIS OF COLON Hx of OTHER ESOPHAGITIS  UNSPECIFIED DISORDER OF KIDNEY AND URETER PERSONAL HISTORY OF THROMBOPHLEBITIS  SLE CHRONIC KIDNEY DISEASE UNSPECIFIED  DIABETES MELLITUS, TYPE II  ARTHRITIS, LEFT SHOULDER   Hyperkalemia    Past Surgical History: Reviewed history from 02/01/2010 and no changes required. Right shoulder surgery  remote hx Pt has abdominal scar, likely hernia repair Flu shot in 10/10 Colonoscopy in 2010  Family History: Reviewed history from 02/01/2010 and no changes required. Heart dx in mother DM in both parents Prostate cancer in father  Social History: Reviewed history from 07/13/2010 and no changes required. 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    Review of Systems       see hpi  Physical Exam  General:  Well-developed,well-nourished,in no acute distress; alert,appropriate and cooperative throughout examination Eyes:  PERRLA, EOMI, some catarct seen b/l Mouth:  MMM, uvula midline Neck:  full ROM, no LAD Lungs:  Normal respiratory effort, chest expands symmetrically. Lungs are clear to auscultation, no crackles or wheezes. Heart:  RRR 2/6 SEM stable Abdomen:  BS +, NT, ND Pulses:  2+ Extremities:  no edema   Impression & Recommendations:  Problem # 1:  GOUT, UNSPECIFIED (ICD-274.9) Pt is doing well. will change to uloric now that we know its covered and stop allopurinol to help with CKD.  The following medications were removed from the medication list:    Allopurinol 100 Mg Tabs (Allopurinol) His updated medication list for this problem includes:    Uloric 40 Mg Tabs (Febuxostat) .Marland Kitchen... Take 1 tab daily  Orders: [facility]- Est  Level 4 (47829)  Problem # 2:  DIABETES MELLITUS, TYPE II (ICD-250.00) will contineu increase in glucotrol whcih has only been for 1 day, will see how we do.  May need to consider insulin in this pt. See again in 1 month  His updated medication list for this problem includes:    Aspir-low 81 Mg Tbec (Aspirin) .Marland Kitchen... 1 tab by mouth once daily    Glucotrol Xl 5 Mg Xr24h-tab (Glipizide) .Marland Kitchen... 2 tab by mouth once daily    Januvia 50  Mg Tabs (Sitagliptin phosphate) ..... Once daily  Orders: Glucose Cap-[facility] (56213) [facility]- Est  Level 4 (08657)  Problem # 3:  ESSENTIAL HYPERTENSION, BENIGN (ICD-401.1) at goal mostly.  Will contineu current regimen.  His updated medication list for this problem includes:    Toprol Xl 100 Mg Xr24h-tab (Metoprolol succinate) .Marland Kitchen... 1 tab by mouth once daily    Furosemide 40 Mg Tabs (Furosemide) ..... One pill daily    Catapres-tts-2 0.2 Mg/24hr Ptwk (Clonidine hcl) .Marland Kitchen... Place on skin and change weekly    Amlodipine Besylate 5 Mg Tabs (Amlodipine besylate) .Marland Kitchen... 1 tab daily  Orders: [facility]- Est  Level 4 (99214)  Complete Medication List: 1)  Aspir-low 81 Mg Tbec (Aspirin) .Marland Kitchen.. 1 tab by mouth once daily 2)  Toprol Xl 100 Mg Xr24h-tab (Metoprolol succinate) .Marland Kitchen.. 1 tab by mouth once daily 3)  Glucotrol Xl 5  Mg Xr24h-tab (Glipizide) .... 2 tab by mouth once daily 4)  Coumadin 5 Mg Tabs (Warfarin sodium) .... As directed 5)  Pravastatin Sodium 40 Mg Tabs (Pravastatin sodium) .Marland Kitchen.. 1 once daily 6)  Ferro-bob 325 (65 Fe) Mg Tabs (Ferrous sulfate) .Marland Kitchen.. 1 tab by mouth once daily 7)  Colace 100 Mg Caps (Docusate sodium) .Marland Kitchen.. 1 tab by mouth two times a day 8)  Januvia 50 Mg Tabs (Sitagliptin phosphate) .... Once daily 9)  Calcium-vitamin D 500-125 Mg-unit Tabs (Calcium-vitamin d) .Marland Kitchen.. 1 two times a day 10)  Furosemide 40 Mg Tabs (Furosemide) .... One pill daily 11)  Vitamin B-12 100 Mcg Tabs (Cyanocobalamin) .Marland Kitchen.. 1 tab daily 12)  Folic Acid 1 Mg Tabs (Folic acid) .Marland Kitchen.. 1 tab daily 13)  Theragran-m Tabs (Multiple vitamins-minerals) .Marland Kitchen.. 1 tab daily 14)  Prodigy Blood Glucose Test Strp (Glucose blood) .... Use as directed 15)  Prodigy Twist Top Lancets 28g Misc (Lancets) .... Use as directed 16)  Bd Disp Needles 25g X 5/8" Misc (Needle (disp)) .... Use as directed 17)  Omeprazole 20 Mg Cpdr (Omeprazole) .... Take 1 tab daily 18)  Calcitriol 0.25 Mcg Caps (Calcitriol) .... Take 1 tab by mouth moday, wed,  friday. 19)  Catapres-tts-2 0.2 Mg/24hr Ptwk (Clonidine hcl) .... Place on skin and change weekly 20)  Amlodipine Besylate 5 Mg Tabs (Amlodipine besylate) .Marland Kitchen.. 1 tab daily 21)  Sodium Bicarbonate 648 Mg Tabs (Sodium bicarbonate) 22)  Prodigy Pocket Pro Blood Gluco W/device Kit (Blood glucose monitoring suppl) .... Use as directed 23)  Uloric 40 Mg Tabs (Febuxostat) .... Take 1 tab daily  Patient Instructions: 1)  Good to see you 2)  Your blood pressure is perfect 3)  I think the increase in the glucotrol at 10mg  will be better as well 4)  I will start uloric. 5)  Stop the allopurinol 6)  I will send in a new prescription for your meter.  7)  I want to see you again after the 1st of the year and we will get labs for your kidneys diabetes and gout.  8)  Happy New Year!  Prescriptions: ULORIC 40 MG TABS (FEBUXOSTAT) take 1 tab daily  #31 x 3   Entered and Authorized by:   Antoine Primas DO   Signed by:   Antoine Primas DO on 11/14/2010   Method used:   Electronically to        [facility] [facility] [address] (retail)       [address]       [place], [place]  [id number]       Ph: [phone]       Fax: [phone]   RxID:   [id number] PRODIGY POCKET PRO BLOOD GLUCO W/DEVICE KIT (BLOOD GLUCOSE MONITORING SUPPL) Use as directed  #1 x 1   Entered and Authorized by:   Antoine Primas DO   Signed by:   Antoine Primas DO on 11/14/2010   Method used:   Electronically to        [facility] (retail)       [address]       [place], [place]  [id number]       Ph: [phone]       Fax: [phone]   RxID:   [id number]    Orders Added: 1)  Glucose Cap-[facility] [82948] 2)  [facility]- Est  Level 4 [45809]

## 2011-01-29 NOTE — Consult Note (Signed)
Summary: [facility], cont warfarin for now  [facility]   Imported By: Knox Royalty 07/21/2010 13:41:57  _____________________________________________________________________  External Attachment:    Type:   Image     Comment:   External Document

## 2011-01-29 NOTE — Letter (Signed)
Summary: [facility]   Imported By: Bradly Bienenstock 03/13/2010 14:31:18  _____________________________________________________________________  External Attachment:    Type:   Image     Comment:   External Document

## 2011-01-29 NOTE — Progress Notes (Signed)
Summary: phn msg  Phone Note From Other Clinic Call back at [phone]   [facility]: PT- inr - 2.0 & 17.2 held [place] does as instructed and ate lots of green leafy veggies. is now on normal coumin regeme Initial call taken by: De Nurse,  July 26, 2010 2:22 PM

## 2011-01-29 NOTE — Letter (Signed)
Summary: *Referral Letter  [facility]  [address]   [place], [place] [address]   Phone: [phone]  Fax: [phone]    06/21/2010  Thank you in advance for agreeing to see my patient:  Philip Matthews [address] [place], [place]  [address]  Phone: [phone]  Reason for Referral: History of Portal Vein Thrombosis in 01/2009  Procedures Requested: Patient has been on coumadin therapy for over 18 months for a hospitalization in 01/2009 at [facility],  please see discharge summary for more information.  What is needed is suggestion  about proper duration of coumadin therapy, it seems suggested it is anywhere from 3 months to life long.  Patient was given a diagnoses of Lupus previously but in his records there is no test results showing this and patient does not remember when he was diagnosed with it.  Will draw labs and will send results to you as well.  Any other insight or suggestions would be helpful as well.  Thank you.  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) 4)  HEMOCCULT POSITIVE STOOL (ICD-578.1) 5)  GOUT, UNSPECIFIED (ICD-274.9) 6)  LIVER MASS (ICD-235.3) 7)  LUNG NODULE (ICD-518.89) 8)  DIVERTICULOSIS OF COLON (ICD-562.10) 9)  Hx of OTHER ESOPHAGITIS (ICD-530.19) 10)  UNSPECIFIED DISORDER OF KIDNEY AND URETER (ICD-593.9) 11)  Hx of PUD, HX OF (ICD-V12.71) 12)  PERSONAL HISTORY OF THROMBOPHLEBITIS (ICD-V12.52) 13)  SLE (ICD-710.0) 14)  CHRONIC KIDNEY DISEASE UNSPECIFIED (ICD-585.9) 15)  DIABETES MELLITUS, TYPE II (ICD-250.00) 16)  ARTHRITIS, LEFT SHOULDER (ICD-716.91) 17)  ANEMIA IN CHRONIC KIDNEY DISEASE (ICD-285.21) 18)  ESSENTIAL HYPERTENSION, BENIGN (ICD-401.1)   Current Medications: 1)  ASPIR-LOW 81 MG TBEC (ASPIRIN) 1 tab by mouth once daily 2)  TOPROL XL 100 MG XR24H-TAB (METOPROLOL SUCCINATE) 1  tab by mouth once daily 3)  GLUCOTROL XL 5 MG XR24H-TAB (GLIPIZIDE) 1 tab by mouth once daily 4)  COUMADIN 5 MG TABS (WARFARIN SODIUM) as directed 5)  PRAVASTATIN SODIUM 40 MG TABS (PRAVASTATIN SODIUM) 1 once daily 6)  CATAPRES-TTS-1 0.1 MG/24HR PTWK (CLONIDINE HCL) 1 patch daily 7)  FERRO-BOB 325 (65 FE) MG TABS (FERROUS SULFATE) 1 tab by mouth once daily 8)  COLACE 100 MG CAPS (DOCUSATE SODIUM) 1 tab by mouth two times a day 9)  JANUVIA 50 MG TABS (SITAGLIPTIN PHOSPHATE) once daily 10)  CALCIUM-VITAMIN D 500-125 MG-UNIT TABS (CALCIUM-VITAMIN D) 1 two times a day 11)  FUROSEMIDE 40 MG TABS (FUROSEMIDE) one pill daily 12)  VITAMIN B-12 100 MCG TABS (CYANOCOBALAMIN) 1 tab daily 13)  FOLIC ACID 1 MG TABS (FOLIC ACID) 1 tab daily 14)  THERAGRAN-M  TABS (MULTIPLE VITAMINS-MINERALS) 1 tab daily 15)  PRODIGY BLOOD GLUCOSE TEST  STRP (GLUCOSE BLOOD) Use as directed 16)  PRODIGY TWIST TOP LANCETS 28G  MISC (LANCETS) use as directed 17)  BD DISP NEEDLES 25G X 5/8" MISC (NEEDLE (DISP)) use as directed   Past Medical History: 1)  HTN, T2DM, anemia, ckd, Pt is deaf and talks with translator, ? hx of lupus still need results from [facility] 2)  ? of ulcers 3)  Lung nodule stable 4)  Liver nodle stable- likely should f/u periodically 5)  DIVERTICULOSIS OF COLON 6)  Hx of OTHER ESOPHAGITIS  7)  UNSPECIFIED DISORDER OF KIDNEY AND URETER 8)  PERSONAL HISTORY OF THROMBOPHLEBITIS  9)  SLE 10)  CHRONIC KIDNEY DISEASE UNSPECIFIED  11)  DIABETES MELLITUS, TYPE II  12)  ARTHRITIS, LEFT SHOULDER  13)   Hyperkalemia    Prior History of Blood Transfusions:   Pertinent Labs:    Thank you again for agreeing to see our patient; please contact us if you have any further questions or need additional information.  Sincerely,  Antoine Primas DO

## 2011-01-29 NOTE — Assessment & Plan Note (Signed)
Summary: F/U/KH   Vital Signs:  Patient profile:   77 year old male Height:      64.5 inches Weight:      142 pounds BMI:     24.08 BSA:     1.70 Temp:     98.2 degrees F Pulse rate:   60 / minute BP sitting:   146 / 76  Vitals Entered By: Jone Baseman CMA (June 21, 2010 2:50 PM) CC: f/u Gout and multiple issues Is Patient Diabetic? Yes Did you bring your meter with you today? No Pain Assessment Patient in pain? no        Primary Care Provider:  Antoine Primas DO  CC:  f/u Gout and multiple issues.  History of Present Illness: Pt is here for f/u for mutliple problems  1.Gout-Left 1st toe pain- Pt states that the predbnisione helped the very next day, no pain does still have some ankle swelling on that side but that is about his baseline.   1.  BP. 140/70 today.  Pt is still on meds.  Denies HA, abdominal pain numbness or tingling in any extremities.  HR 60.  On toprol and catapress.    2.  Shoulder pain.  Pt is going to have surgery in the near future but must get stress test scheduled for next week.  Pt told to make sure Warfarin is held days prior to surgery.  3.  hx of portal vein thrombosis-  pt been on coumadin daily since that admission in 2/10.   4.  CKD stage 3-  Pt is doing well, urinating still fine without hesitation.  Pt seen by nephrologist, they want to make sure that pt anemia is due to chronic kidney disease will be seeing nephrologist again on july 6th   5.  SLE-  ? of lupus, in reports from [facility] did not have any labs, will draw labs today due to pt having a chance to stop warfarin.  Pt has been off plaquenil for quite some time.  Will check labs.  Pt does not remember when he was diagnosed with it.   6.  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  He will hold the warfarin for five days previously and then will restart the day after.    Pt is deaf and used interprter during  interview.     Habits & Providers  Alcohol-Tobacco-Diet     Tobacco Status: never  Current Medications (verified): 1)  Aspir-Low 81 Mg Tbec (Aspirin) .Marland Kitchen.. 1 Tab By Mouth Once Daily 2)  Toprol Xl 100 Mg Xr24h-Tab (Metoprolol Succinate) .Marland Kitchen.. 1 Tab By Mouth Once Daily 3)  Glucotrol Xl 5 Mg Xr24h-Tab (Glipizide) .Marland Kitchen.. 1 Tab By Mouth Once Daily 4)  Coumadin 5 Mg Tabs (Warfarin Sodium) .... As Directed 5)  Pravastatin Sodium 40 Mg Tabs (Pravastatin Sodium) .Marland Kitchen.. 1 Once Daily 6)  Catapres-Tts-1 0.1 Mg/24hr Ptwk (Clonidine Hcl) .Marland Kitchen.. 1 Patch Daily 7)  Ferro-Bob 325 (65 Fe) Mg Tabs (Ferrous Sulfate) .Marland Kitchen.. 1 Tab By Mouth Once Daily 8)  Colace 100 Mg Caps (Docusate Sodium) .Marland Kitchen.. 1 Tab By Mouth Two Times A Day 9)  Januvia 50 Mg Tabs (Sitagliptin Phosphate) .... Once Daily 10)  Calcium-Vitamin D 500-125 Mg-Unit Tabs (Calcium-Vitamin D) .Marland Kitchen.. 1 Two Times A Day 11)  Furosemide 40 Mg Tabs (Furosemide) .... One Pill Daily 12)  Vitamin B-12 100 Mcg Tabs (Cyanocobalamin) .Marland Kitchen.. 1 Tab Daily 13)  Folic Acid 1 Mg Tabs (Folic Acid) .Marland Kitchen.. 1 Tab Daily 14)  Theragran-M  Tabs (Multiple Vitamins-Minerals) .Marland Kitchen.. 1 Tab Daily 15)  Prodigy Blood Glucose Test  Strp (Glucose Blood) .... Use As Directed 16)  Prodigy Twist Top Lancets 28g  Misc (Lancets) .... Use As Directed 17)  Bd Disp Needles 25g X 5/8" Misc (Needle (Disp)) .... Use As Directed  Allergies (verified): No Known Drug Allergies  Past History:  Past medical, surgical, family and social histories (including risk factors) reviewed, and no changes noted (except as noted below).  Past Medical History: Reviewed history from 05/30/2010 and no changes required. HTN, T2DM, anemia, ckd, Pt is deaf and talks with translator, ? hx of lupus still need results from [facility] ? of ulcers Lung nodule stable Liver nodle stable- likely should f/u periodically DIVERTICULOSIS OF COLON Hx of OTHER ESOPHAGITIS  UNSPECIFIED DISORDER OF KIDNEY AND URETER PERSONAL HISTORY OF  THROMBOPHLEBITIS  SLE CHRONIC KIDNEY DISEASE UNSPECIFIED  DIABETES MELLITUS, TYPE II  ARTHRITIS, LEFT SHOULDER   Hyperkalemia   Past Surgical History: Reviewed history from 02/01/2010 and no changes required. Right shoulder surgery  remote hx Pt has abdominal scar, likely hernia repair Flu shot in 10/10 Colonoscopy in 2010  Family History: Reviewed history from 02/01/2010 and no changes required. Heart dx in mother DM in both parents Prostate cancer in father  Social History: Reviewed history from 02/01/2010 and no changes required. Lives with Daughter [name]), who is also his POA no smoke, no drink, no illicits Like to go to nusing homes and do activities with peers.    Review of Systems       denies fever, chills, nausea, vomiting, diarrhea or constipation   Physical Exam  General:  NAD vitals reviewed Eyes:  PERRLA, EOMI, some catarct seen b/l Mouth:  MMM, uvula midline Lungs:  Normal respiratory effort, chest expands symmetrically. Lungs are clear to auscultation, no crackles or wheezes. Heart:  RRR 2/6 SEM stable Msk:  shoulder still have limited range of motion on left, still causes pain.  Extremities:  left ankle minimal swelling left 1st toe no swellin or pain or erythema.  Skin:  no rash   Impression & Recommendations:  Problem # 1:  MIXED HYPERLIPIDEMIA (ICD-272.2) pt was changed by his cardiologist due to his elevated lft's  pt said he did have his blood drawn recently. Will look in Echart for values.   His updated medication list for this problem includes:    Pravastatin Sodium 40 Mg Tabs (Pravastatin sodium) .Marland Kitchen... 1 once daily  Orders: [facility]- Est  Level 4 (88416)  Problem # 2:  Hx of PORTAL VEIN THROMBOSIS (ICD-452) will send to hematologist fpr futher instruction on coumadin therpy for the condition.  Per uptodate it seems that most specialist lean toward lifelong treatment but still no concensus.  At this time will stop coumadin before both  procedures pt is coming up with and then will restart after procedure without lovenox bridge, the choice was given to pt and poa, they stated they were comfortable with this.   His updated medication list for this problem includes:    Aspir-low 81 Mg Tbec (Aspirin) .Marland Kitchen... 1 tab by mouth once daily    Coumadin 5 Mg Tabs (Warfarin sodium) .Marland Kitchen... As directed  Orders: Hematology Referral (Hematology) [facility]- Est  Level 4 (60630)  Problem # 3:  SLE (ICD-710.0) No labs came over from [facility], will redraw ana, dsDNA and anti smith to diagnose SLE.  If positive will  likely nneed life long coumadin therapy due to hypercoaguable state.    His updated medication list for this problem includes:    Aspir-low 81 Mg Tbec (Aspirin) .Marland Kitchen... 1 tab by mouth once daily  Orders: ANA-[facility] ([id number]) Miscellaneous Lab Charge-[facility] ([address]11) [facility]- Est  Level 4 (91478)  Problem # 4:  GOUT, UNSPECIFIED (ICD-274.9) Assessment: Improved resolved, given handout on food choices.   Orders: [facility]- Est  Level 4 (29562) med rec was cleaned up  Complete Medication List: 1)  Aspir-low 81 Mg Tbec (Aspirin) .Marland Kitchen.. 1 tab by mouth once daily 2)  Toprol Xl 100 Mg Xr24h-tab (Metoprolol succinate) .Marland Kitchen.. 1 tab by mouth once daily 3)  Glucotrol Xl 5 Mg Xr24h-tab (Glipizide) .Marland Kitchen.. 1 tab by mouth once daily 4)  Coumadin 5 Mg Tabs (Warfarin sodium) .... As directed 5)  Pravastatin Sodium 40 Mg Tabs (Pravastatin sodium) .Marland Kitchen.. 1 once daily 6)  Catapres-tts-1 0.1 Mg/24hr Ptwk (Clonidine hcl) .Marland Kitchen.. 1 patch daily 7)  Ferro-bob 325 (65 Fe) Mg Tabs (Ferrous sulfate) .Marland Kitchen.. 1 tab by mouth once daily 8)  Colace 100 Mg Caps (Docusate sodium) .Marland Kitchen.. 1 tab by mouth two times a day 9)  Januvia 50 Mg Tabs (Sitagliptin phosphate) .... Once daily 10)  Calcium-vitamin D 500-125 Mg-unit Tabs (Calcium-vitamin d) .Marland Kitchen.. 1 two times a day 11)  Furosemide 40 Mg Tabs (Furosemide) .... One pill daily 12)  Vitamin B-12 100 Mcg Tabs (Cyanocobalamin) .Marland Kitchen.. 1 tab  daily 13)  Folic Acid 1 Mg Tabs (Folic acid) .Marland Kitchen.. 1 tab daily 14)  Theragran-m Tabs (Multiple vitamins-minerals) .Marland Kitchen.. 1 tab daily 15)  Prodigy Blood Glucose Test Strp (Glucose blood) .... Use as directed 16)  Prodigy Twist Top Lancets 28g Misc (Lancets) .... Use as directed 17)  Bd Disp Needles 25g X 5/8" Misc (Needle (disp)) .... Use as directed  Patient Instructions: 1)  I tis good to see you 2)  I do want you to stop your coumadin 5 days before your procedure of the endoscopy.  I want you to start warfarin again the day after.  3)  I want you to do the same thing for the shoulder surgery as well.  Tresa Endo knows this as well.  She will help you.  4)  I am drawing blood today and will call you with the results. 5)  I also want you to see a hematologist due to your history of of a clot in his liver. 6)  I want to see you again when everything, including your shoulder surgery, and your are feeling well enough.

## 2011-01-29 NOTE — Letter (Signed)
Summary: Generic Letter  [facility]  [address]   [place], [place] [address]   Phone: [phone]  Fax: [phone]    04/02/2010  [address] [place], [place]  [address]  Dear Mr. LIZOTTE,         Your doctor, Dr. Katrinka Blazing,  requested we schedule you an appointment with a kidney specialist . An appointment has been scheduled for April 27,2011 at 11:00 with Dr. Allena Katz of [facility].  Please arrive 15 minutes early. The address is [address] , [place]. The phone number is [phone]. I was unable to contact you by phone because we have an incorrect phone number listed for you. If this time is not convenient please call their office to reschedule.  Please take a list of all the medicines you currently take including over the counter meds.         Please call our office to update you phone information. Thank you.           Sincerely,   Theresia Lo RN

## 2011-01-29 NOTE — Assessment & Plan Note (Signed)
Summary: fu/kh   Vital Signs:  Patient profile:   77 year old Matthews Height:      64.5 inches Weight:      142.6 pounds BMI:     24.19 Temp:     98.0 degrees F oral Pulse rate:   79 / minute BP sitting:   112 / 71  (left arm) Cuff size:   regular  Vitals Entered By: Garen Grams LPN (April 16, 2010 Philip:59 AM) CC: f/u shoulder pain, dm Is Patient Diabetic? Yes Did you bring your meter with you today? No Pain Assessment Patient in pain? yes     Location: left shoulder   Primary Care Provider:  Antoine Primas DO  CC:  f/u shoulder pain and dm.  History of Present Illness: Pt is here for f/u for mutliple problems 1.  BP. 112/71 today.  Pt is still on meds.  Denies HA, abdominal pain numbness or tingling in any extremities.  HR 79.  On toprol and catapress.    2.  Shoulder pain.  Left shoulder still giving problems, shot did help some but then pain returned.  Pt is finishing PT this week.  Did not feel that it helped very much.  Has home exercises as well. Percocet does not seem to be helping anymore either.  Does want to avoid surgery  3.  DM- Sugars have been good in the 120's, 130's at home.  Only on glipizide 5 mg.  Told to avoid metformin due to KIDNEY ISSUES  4.  CKD stage 3-  Pt is doing well, urinating still fine without hesitation.  Pt has appt with [facility] on 4/27.      Current Medications (verified): 1)  Aspir-Low 81 Mg Tbec (Aspirin) .Marland Kitchen.. 1 Tab By Mouth Once Daily 2)  Toprol Xl 100 Mg Xr24h-Tab (Metoprolol Succinate) .Marland Kitchen.. 1 Tab By Mouth Once Daily 3)  Glucotrol Xl 5 Mg Xr24h-Tab (Glipizide) .Marland Kitchen.. 1 Tab By Mouth Once Daily 4)  Coumadin 5 Mg Tabs (Warfarin Sodium) .... As Directed 5)  Zocor 80 Mg Tabs (Simvastatin) .Marland Kitchen.. 1 Tab By Mouth At Bedtime 6)  Sodium Bicarbonate 648 Mg Tabs (Sodium Bicarbonate) .Marland Kitchen.. 1 Tab By Mouth Two Times A Day 7)  Catapres-Tts-1 0.1 Mg/24hr Ptwk (Clonidine Hcl) .Marland Kitchen.. 1 Patch Daily 8)  Ferro-Bob 325 (65 Fe) Mg Tabs (Ferrous Sulfate) .Marland Kitchen..  1 Tab By Mouth Once Daily Philip)  Plaquenil 200 Mg Tabs (Hydroxychloroquine Sulfate) .Marland Kitchen.. 1 Tab By Mouth Once Daily 10)  Colace 100 Mg Caps (Docusate Sodium) .Marland Kitchen.. 1 Tab By Mouth Two Times A Day 11)  Hydrocodone-Acetaminophen 2.5-500 Mg Tabs (Hydrocodone-Acetaminophen) .... Can Take 1 Pill Every 8 Hours As Needed 12)  Tramadol Hcl 50 Mg Tabs (Tramadol Hcl) .... Take 1/2 Tab By Mouth Q8 Hr As Needed For Shoulder Pain  Allergies (verified): No Known Drug Allergies  Past History:  Past medical, surgical, family and social histories (including risk factors) reviewed, and no changes noted (except as noted below).  Past Medical History: HTN, T2DM, anemia, ckd, Pt is deaf and talks with translator, ? hx of lupus still need results from [facility] ? of ulcers Lung nodule stable Liver nodle stable- likely should f/u periodically  Past Surgical History: Reviewed history from 02/01/2010 and no changes required. Right shoulder surgery  remote hx Pt has abdominal scar, likely hernia repair Flu shot in 10/10 Colonoscopy in 2010  Family History: Reviewed history from 02/01/2010 and no changes required. Heart dx in mother DM in both parents Prostate  cancer in father  Social History: Reviewed history from 02/01/2010 and no changes required. Lives with Daughter [name]), who is also his POA no smoke, no drink, no illicits Like to go to nusing homes and do activities with peers.    Review of Systems       denies fever, chills, nausea, vomiting, diarrhea or constipation   Physical Exam  General:  NAD Eyes:  PERRLA, EOMI, some catarct seen b/l Mouth:  MMM, uvula midline Lungs:  Normal respiratory effort, chest expands symmetrically. Lungs are clear to auscultation, no crackles or wheezes. Heart:  RRR 2/6 SEM stable Abdomen:  BS +, NT,, ND Extremities:  Left shoulder, decrease ROM in all planes,decreased from last visit still worse with internal rotation, could not bring arm to 90 flexion  or extention instaed about 45 degrees.  + crepitus, no radiation of pain stay localized on joint space.   Right shoulder incision from previous surgery.  Better ROM but still not full minimal crepitus LE no edema   Impression & Recommendations:  Problem # 1:  ARTHRITIS, LEFT SHOULDER (ICD-716.91)  Add tramadol to regimen.  Refer to orthopaedic physician. Pt is going to continue low dose percocet.  Will continue exercises.     Orders: [facility]- Est  Level 4 (56213) Orthopedic Referral (Ortho)  Problem # 2:  DIABETES MELLITUS, TYPE II (ICD-250.00) Assessment: Improved NEED A1C and Micro albumin at next visit. His updated medication list for this problem includes:    Aspir-low 81 Mg Tbec (Aspirin) .Marland Kitchen... 1 tab by mouth once daily    Glucotrol Xl 5 Mg Xr24h-tab (Glipizide) .Marland Kitchen... 1 tab by mouth once daily  Orders: [facility]- Est  Level 4 (08657)  Problem # 3:  ESSENTIAL HYPERTENSION, BENIGN (ICD-401.1)  Well controlled  112/71 today.  Continue current tx. His updated medication list for this problem includes:    Toprol Xl 100 Mg Xr24h-tab (Metoprolol succinate) .Marland Kitchen... 1 tab by mouth once daily    Catapres-tts-1 0.1 Mg/24hr Ptwk (Clonidine hcl) .Marland Kitchen... 1 patch daily  Orders: [facility]- Est  Level 4 (99214)  Complete Medication List: 1)  Aspir-low 81 Mg Tbec (Aspirin) .Marland Kitchen.. 1 tab by mouth once daily 2)  Toprol Xl 100 Mg Xr24h-tab (Metoprolol succinate) .Marland Kitchen.. 1 tab by mouth once daily 3)  Glucotrol Xl 5 Mg Xr24h-tab (Glipizide) .Marland Kitchen.. 1 tab by mouth once daily 4)  Coumadin 5 Mg Tabs (Warfarin sodium) .... As directed 5)  Zocor 80 Mg Tabs (Simvastatin) .Marland Kitchen.. 1 tab by mouth at bedtime 6)  Sodium Bicarbonate 648 Mg Tabs (Sodium bicarbonate) .Marland Kitchen.. 1 tab by mouth two times a day 7)  Catapres-tts-1 0.1 Mg/24hr Ptwk (Clonidine hcl) .Marland Kitchen.. 1 patch daily 8)  Ferro-bob 325 (65 Fe) Mg Tabs (Ferrous sulfate) .Marland Kitchen.. 1 tab by mouth once daily Philip)  Plaquenil 200 Mg Tabs (Hydroxychloroquine sulfate) .Marland Kitchen.. 1 tab by mouth once  daily 10)  Colace 100 Mg Caps (Docusate sodium) .Marland Kitchen.. 1 tab by mouth two times a day 11)  Hydrocodone-acetaminophen 2.5-500 Mg Tabs (Hydrocodone-acetaminophen) .... Can take 1 pill every 8 hours as needed 12)  Tramadol Hcl 50 Mg Tabs (Tramadol hcl) .... Take 1/2 tab by mouth q8 hr as needed for shoulder pain  Patient Instructions: 1)  It is good to see you 2)  I wish your shoulder was doing better.   3)  I am going to give you a new pill called tramadol.  Take 1/2 tab by mouth up to every 8 hours as needed for pain.  You  can then still take the hydrocodone/acetaminophen pills if needed as well.   4)  I am going to refer you to a orthopaedic surgeon just to see what options are available to you. 5)  I want to see you again in 1-2 months to recheck some labs.  Prescriptions: TRAMADOL HCL 50 MG TABS (TRAMADOL HCL) take 1/2 tab by mouth Q8 hr as needed for shoulder pain  #90 x 1   Entered and Authorized by:   Antoine Primas DO   Signed by:   Antoine Primas DO on 04/16/2010   Method used:   Electronically to        [id number] (retail)       [address]       [place], [place]  [id number]       Ph: [phone]       Fax: [phone]   RxID:   [id number]   Prevention & Chronic Care Immunizations   Influenza vaccine: Not documented   Influenza vaccine due: 08/30/2010    Tetanus booster: Not documented    Pneumococcal vaccine: Not documented    H. zoster vaccine: Not documented  Colorectal Screening   Hemoccult: Not documented    Colonoscopy: Not documented   Colonoscopy action/deferral: Not indicated  (03/06/2010)   Colonoscopy due: 10/20/2014  Other Screening   PSA: Not documented   PSA action/deferral: Discussed-decision deferred  (02/01/2010)   Smoking status: never  (03/06/2010)  Diabetes Mellitus   HgbA1C: Not documented    Eye exam: Not documented    Foot exam: Not documented   High risk foot: Not documented   Foot care education:  Not documented    Urine microalbumin/creatinine ratio: Not documented    Diabetes flowsheet reviewed?: Yes   Progress toward A1C goal: Unchanged  Lipids   Total Cholesterol: Not documented   LDL: Not documented   LDL Direct: Not documented   HDL: Not documented   Triglycerides: Not documented  Hypertension   Last Blood Pressure: 112 / 71  (04/16/2010)   Serum creatinine: 1.86  (02/01/2010)   Serum potassium 4.7  (02/01/2010)  Self-Management Support :    Diabetes self-management support: Not documented    Hypertension self-management support: Not documented

## 2011-01-29 NOTE — Progress Notes (Signed)
Summary: phn msg  Phone Note Other Incoming Call back at [phone]   Caller: [facility] Summary of Call: Spoke with Arna Medici at Dr Allena Katz and they had gotten him to do a 3 day hemocult test which was positive.  The pt was still taking iron at the time the test was done.  But Dr. Allena Katz felt the results were truly positive and would like for Dr. Katrinka Blazing to set him up with a gastro doctor.  Pt is still taking coumadin.  Is there anything else the home health nurse needs to do differentally? Initial call taken by: Clydell Hakim,  June 08, 2010 3:31 PM  Follow-up for Phone Call        Called LM for [facility] that referral is in for him to see GI Dr. Follow-up by: Antoine Primas DO,  June 08, 2010 4:44 PM  New Problems: Hx of PORTAL VEIN THROMBOSIS (ICD-452) HEMOCCULT POSITIVE STOOL (ICD-578.1)   New Problems: Hx of PORTAL VEIN THROMBOSIS (ICD-452) HEMOCCULT POSITIVE STOOL (ICD-578.1)

## 2011-01-29 NOTE — Miscellaneous (Signed)
Summary: surgical clearance letter  Clinical Lists Changes this pt needs a note from you that he is cleared for surgery. the note from the orthopedist is in your chart box.Golden Circle RN  May 15, 2010 8:32 AM  Pt had letter made will be sent to orthopod today.  Pt should have BMET to check kidney function before surgery otherwise cleared medically.

## 2011-01-29 NOTE — Progress Notes (Signed)
  Phone Note From Other Clinic   Caller: Dr. [facility]) Summary of Call: Talked to him about holding coumadin, been on it for portal vein thrombosis not for afib or anything heart related, told him we will hold it for 5 days prior to endo and then will restart.  Likely need to talk to hematology as well about proper coumadin therapy for portal vein thrombosis.  Initial call taken by: Antoine Primas DO,  June 19, 2010 10:56 AM

## 2011-01-29 NOTE — Progress Notes (Signed)
Summary: PT/INR  Phone Note Other Incoming Call back at [phone]   Caller: [facility] nurse Summary of Call: PT/INR today was 1.7 & 15.8, pt has had Coumadin and ASA on hold for past week due to surgery. Would MD like to make any changes? Also BP today was 140/82. Saw Dr Allena Katz and was told high Potassium was very high and will be going back to Dr Allena Katz tomm to follow up on this.  Initial call taken by: Garen Grams LPN,  October 30, 2010 9:18 AM  Follow-up for Phone Call        restart normal dosing and monitor in 1 week.   They do not know the value of the potassium and will be seeing someone tomorrow, told pt not to eat bananas until see tomorrw, pt is asymptomatic.  Follow-up by: Antoine Primas DO,  October 30, 2010 1:27 PM

## 2011-01-29 NOTE — Progress Notes (Signed)
Summary: triage  Phone Note Other Incoming Call back at [phone]   Caller: [facility] Summary of Call: Pt is coming in for last visit today wants to get him set up for TENS unit.  Would really like to get it done today.  Is there anyway to get a verbal from Dr. Katrinka Blazing or someone else. Initial call taken by: Clydell Hakim,  March 28, 2010 11:17 AM  Follow-up for Phone Call        spoke with [facility]. after 8 visits, pt has only 25 degrees of flexion. they tried estim at last visit & that was very helpful for the pain. as this is his last visit today. she would like an order for a TENS unit so she can go over instructions before discharge. pcp not here. asked preceptor for the order Follow-up by: Golden Circle RN,  March 28, 2010 11:21 AM  Additional Follow-up for Phone Call Additional follow up Details #1::        Verbal approval by Dr. Swaziland for the TENS unit. called [facility] back to give the order. they need a written order faxed to them [phone] Additional Follow-up by: Golden Circle RN,  March 28, 2010 11:27 AM    Additional Follow-up for Phone Call Additional follow up Details #2:: Follow-up by: Golden Circle RN,  March 28, 2010 12:06 PM  Additional Follow-up for Phone Call Additional follow up Details #3:: Details for Additional Follow-up Action Taken: Orders done Additional Follow-up by: Sarah Swaziland MD,  March 29, 2010 8:53 AM

## 2011-01-31 NOTE — Progress Notes (Signed)
Summary: needs orders  Phone Note Call from Patient Call back at [phone]   Caller: [facility]- Tresa Endo Summary of Call: wants to know if he can get PT for balance and weakness Initial call taken by: De Nurse,  January 15, 2011 3:17 PM  Follow-up for Phone Call        Verbally given order for PT for evaluation of ambulation and walking.  Follow-up by: Antoine Primas DO,  January 15, 2011 3:54 PM

## 2011-01-31 NOTE — Progress Notes (Signed)
Summary: PT/INR from [facility]  Phone Note Other Incoming   CallerArline Asp with [facility] [phone] Summary of Call: Calling with PT/INR results.  PT 5.5/INR 65.9.  States patient is currently taking Coumadin 7.5mg  M,W,F; 5mg  other days. Paged to Dr.Smith.  Cindy advised per Dr. Katrinka Blazing,  to have patient hold his Coumadin x 2 days then take 5mg  daily.  Recheck INR in one week on 12/31/2010. Initial call taken by: Terese Door,  December 25, 2010 10:57 AM

## 2011-01-31 NOTE — Progress Notes (Signed)
Summary: Phn Msg  Phone Note From Other Clinic Call back at [phone]   Caller: [facility] Summary of Call: does not qualify for home health services, not home bound, medicare will not cover PT, pt could be referred to [facility] outpt pt eval.  Initial call taken by: Knox Royalty,  January 18, 2011 10:30 AM  Follow-up for Phone Call        saw pt today doing well, will hold on referral at this time, if continue with problem then will refer.  Follow-up by: Antoine Primas DO,  January 18, 2011 11:14 AM

## 2011-01-31 NOTE — Consult Note (Signed)
[facility]: [facility]   Imported By: De Nurse 12/19/2010 12:04:06  _____________________________________________________________________  External Attachment:    Type:   Image     Comment:   External Document

## 2011-01-31 NOTE — Progress Notes (Signed)
  Phone Note Other Incoming Call back at [phone]   Caller: [facility] Summary of Call: He is coming to see Dr. Katrinka Blazing tomorrow and want to make sure Dr. Katrinka Blazing to have his protime INR checked while there.  Can call Tresa Endo tomorrow with orders after seeing patient.  Follow-up for Phone Call        that will be fine.  Follow-up by: Antoine Primas DO,  December 10, 2010 4:38 PM

## 2011-01-31 NOTE — Miscellaneous (Signed)
  Clinical Lists Changes  Medications: Added new medication of KAYEXALATE  POWD (SODIUM POLYSTYRENE SULFONATE) 15gm by mouth every tuesday and friday. - Signed Rx of KAYEXALATE  POWD (SODIUM POLYSTYRENE SULFONATE) 15gm by mouth every tuesday and friday.;  #1 large x 1;  Signed;  Entered by: Antoine Primas DO;  Authorized by: Antoine Primas DO;  Method used: Electronically to [facility] [address], [place], [place]  [id number], Ph: [phone], Fax: [phone]  Talked to [facility] home nurse will relay the message to help bring potassium down with kayexylate and expect to cause some diarrhea.   Prescriptions: KAYEXALATE  POWD (SODIUM POLYSTYRENE SULFONATE) 15gm by mouth every tuesday and friday.  #1 large x 1   Entered and Authorized by:   Antoine Primas DO   Signed by:   Antoine Primas DO on 01/22/2011   Method used:   Electronically to        [facility] (retail)       [address]       [place], [place]  [id number]       Ph: [phone]       Fax: [phone]   RxID:   [id number]

## 2011-01-31 NOTE — Consult Note (Signed)
Summary: [facility]  [facility]   Imported By: Knox Royalty 01/24/2011 11:59:50  _____________________________________________________________________  External Attachment:    Type:   Image     Comment:   External Document

## 2011-01-31 NOTE — Letter (Signed)
Summary: Generic Letter  [facility]  [address]   [place], [place] [address]   Phone: [phone]  Fax: [phone]    01/22/2011  [address] [place], [place]  [address]  Dear Philip Matthews,  We are happy to let you know that since you are covered under Medicare you are able to have a FREE visit at the [facility] to discuss your HEALTH. This is a new benefit for Medicare.  There will be no co-payment.  At this visit you will meet with Arlys John an expert in wellness and the health coach at our clinic.  At this visit we will discuss ways to keep you healthy and feeling well.  This visit will not replace your regular doctor visit and we cannot refill medications.     You will need to plan to be here at least one hour to talk about your medical history, your current status, review all of your medications, and discuss your future plans for your health.  This information will be entered into your record for your doctor to have and review.  If you are interested in staying healthy, this type of visit can help.  Please call the office at: [phone], to schedule a "Medicare Wellness Visit".  The day of the visit you should bring in all of your medications, including any vitamins, herbs, over the counter products you take.  Make a list of all the other doctors that you see, so we know who they are. If you have any other health documents please bring them.  We look forward to helping you stay healthy.   Sincerely,   [facility]  i[facility]

## 2011-01-31 NOTE — Progress Notes (Signed)
Summary: phn msg  Phone Note From Other Clinic Call back at [phone]   Caller: [facility] Summary of Call: wants to know if she needs to go out next week to check his INR - pls advise Initial call taken by: De Nurse,  December 12, 2010 4:40 PM  Follow-up for Phone Call        Please call and tell her yes that would be great if she has time.  Thank you.  Follow-up by: Antoine Primas DO,  December 13, 2010 12:50 PM  Additional Follow-up for Phone Call Additional follow up Details #1::        Tresa Endo will cal back.  She was with a client when I called. Additional Follow-up by: Jone Baseman CMA,  December 13, 2010 1:59 PM    Additional Follow-up for Phone Call Additional follow up Details #2::    She will go out tuesday.  She will also send orders to continue care.  Will need MD to sign Follow-up by: Jone Baseman CMA,  December 13, 2010 2:27 PM

## 2011-01-31 NOTE — Miscellaneous (Signed)
Summary: labs from [facility]  Clinical Lists Changes cbc had abnormalities. message to pcp to check report & give orders if any or if pt needs to come in.Golden Circle RN  January 09, 2011 9:25 AM   What labs I do not have them at this time.  Are they in my box?  DOn't see them and gone til monday.   Terrilee Files 01/09/11 at 11 am  Dr. Mauricio Po reviewed it. no action at this time. it is in your chart box.Golden Circle RN  January 10, 2011 8:55 AM

## 2011-01-31 NOTE — Progress Notes (Signed)
  Phone Note Other Incoming Call back at [phone]   Caller: [facility] Summary of Call: INR level is 2.0 and 23.7 is his protime.  He has 5 mg of coumadin in each day's med box.  Call if you need to change anything or give any further instructions. Initial call taken by: Antoine Primas DO,  January 01, 2011 9:53 AM  Follow-up for Phone Call        called change to warfarin to 7.5mg  by mouth M and friday otherwise 5mg  daily check in 2 weeks Follow-up by: Antoine Primas DO,  January 01, 2011 9:54 AM

## 2011-01-31 NOTE — Assessment & Plan Note (Signed)
Summary: F/U  KH   Vital Signs:  Patient profile:   77 year old male Height:      64.5 inches Weight:      148.31 pounds BMI:     25.15 BSA:     1.73 Pulse rate:   64 / minute BP sitting:   160 / 92  Vitals Entered By: Jone Baseman CMA (January 18, 2011 10:03 AM) CC: f/u Is Patient Diabetic? Yes Did you bring your meter with you today? No Pain Assessment Patient in pain? no        Primary Care Provider:  Antoine Primas DO  CC:  f/u.  History of Present Illness: 77 yo male here for f/u  1. DM glucose low:73 Glucose high:210 Last A1C:11.3 due today Taking Meds:yes increased glipzide to 15mg  daily On INsulin:no Side effects:no ROS: Denies polyuria, polydipsia, visual changes numbness in extremities, foot ulcers Lifestyle modifications:Pt has seen a dietician and with the aide of his daughter is eating more healthy.   2.  htn BP today: 160/92 Taking Meds:yes Side Effects:no ROS: denies Headache visual changes nausea vomiting abdominal pain numbness in extremities .   Pt has been told at multiple dr appoointments over the last month the his blood pressure is elevated.   3.  Constipation-  Pt states is only have 1-2 B< a week has straining but no blood.  No weight chages no abdominal pain.  Had one yesterday but used sorbitol to get things moving. Stool look normal .   4.  URI-  Has had a cough and runny nose for the last 3 days.  Denies fever, chills, nausea, vomiting, diarrhea.  Pt has had maybe some sick contacts.  Cough is not productive.   5.  Pt was having trouble with ambulation for sometime due to pain but states that it has improved and daughter agrees.  Will hold on PT consult at this time.   Sign language interpreter was present.     Habits & Providers  Alcohol-Tobacco-Diet     Tobacco Status: quit     Year Quit: 2001  Current Medications (verified): 1)  Aspir-Low 81 Mg Tbec (Aspirin) .Marland Kitchen.. 1 Tab By Mouth Once Daily 2)  Toprol Xl 100 Mg Xr24h-Tab  (Metoprolol Succinate) .Marland Kitchen.. 1 Tab By Mouth Once Daily 3)  Glucotrol Xl 5 Mg Xr24h-Tab (Glipizide) .... 3 Tab By Mouth Once Daily 4)  Coumadin 5 Mg Tabs (Warfarin Sodium) .... As Directed 5)  Pravastatin Sodium 40 Mg Tabs (Pravastatin Sodium) .Marland Kitchen.. 1 Once Daily 6)  Ferro-Bob 325 (65 Fe) Mg Tabs (Ferrous Sulfate) .Marland Kitchen.. 1 Tab By Mouth Once Daily 7)  Colace 100 Mg Caps (Docusate Sodium) .Marland Kitchen.. 1 Tab By Mouth Two Times A Day 8)  Januvia 50 Mg Tabs (Sitagliptin Phosphate) .... Once Daily 9)  Calcium-Vitamin D 500-125 Mg-Unit Tabs (Calcium-Vitamin D) .Marland Kitchen.. 1 Two Times A Day 10)  Furosemide 40 Mg Tabs (Furosemide) .... One Pill Daily 11)  Vitamin B-12 100 Mcg Tabs (Cyanocobalamin) .Marland Kitchen.. 1 Tab Daily 12)  Folic Acid 1 Mg Tabs (Folic Acid) .Marland Kitchen.. 1 Tab Daily 13)  Prodigy Blood Glucose Test  Strp (Glucose Blood) .... Use As Directed 14)  Prodigy Twist Top Lancets 28g  Misc (Lancets) .... Use As Directed 15)  Bd Disp Needles 25g X 5/8" Misc (Needle (Disp)) .... Use As Directed 16)  Omeprazole 20 Mg Cpdr (Omeprazole) .... Take 1 Tab Daily 17)  Calcitriol 0.25 Mcg Caps (Calcitriol) .... Take 1 Tab By Mouth Moday, Wed, Friday.  18)  Catapres-Tts-2 0.2 Mg/24hr Ptwk (Clonidine Hcl) .... Place On Skin and Change Weekly 19)  Sodium Bicarbonate 648 Mg Tabs (Sodium Bicarbonate) 20)  Prodigy Pocket Pro Blood Gluco W/device Kit (Blood Glucose Monitoring Suppl) .... Use As Directed 21)  Uloric 80 Mg Tabs (Febuxostat) .Marland Kitchen.. 1 Tab Daily 22)  Prednisone 5 Mg Tabs (Prednisone) .... One Tablet Daily As Directed  After Completing Prednisone Burst of 40 Mg Daily For 3 Days 23)  Multivitamins  Tabs (Multiple Vitamin) .Marland Kitchen.. 1 Tab Daily 24)  Miralax  Powd (Polyethylene Glycol 3350) .Marland Kitchen.. 1 Capful Two Times A Day For Constipation 25)  Amlodipine Besylate 10 Mg Tabs (Amlodipine Besylate) .Marland Kitchen.. 1 Tab Daily  Allergies (verified): No Known Drug Allergies  Past History:  Past medical, surgical, family and social histories (including risk  factors) reviewed, and no changes noted (except as noted below).  Past Medical History: Reviewed history from 05/30/2010 and no changes required. HTN, T2DM, anemia, ckd, Pt is deaf and talks with translator, ? hx of lupus still need results from [facility] ? of ulcers Lung nodule stable Liver nodle stable- likely should f/u periodically DIVERTICULOSIS OF COLON Hx of OTHER ESOPHAGITIS  UNSPECIFIED DISORDER OF KIDNEY AND URETER PERSONAL HISTORY OF THROMBOPHLEBITIS  SLE CHRONIC KIDNEY DISEASE UNSPECIFIED  DIABETES MELLITUS, TYPE II  ARTHRITIS, LEFT SHOULDER   Hyperkalemia   Past Surgical History: Reviewed history from 02/01/2010 and no changes required. Right shoulder surgery  remote hx Pt has abdominal scar, likely hernia repair Flu shot in 10/10 Colonoscopy in 2010  Family History: Reviewed history from 02/01/2010 and no changes required. Heart dx in mother DM in both parents Prostate cancer in father  Social History: Reviewed history from 07/13/2010 and no changes required. 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   Smoking Status:  quit  Review of Systems       see hpi  Physical Exam  General:  Well-developed,well-nourished,in no acute distress; alert,appropriate and cooperative throughout examination Eyes:  PERRLA, EOMI, some catarct seen b/l Ears:  PERRL, EOMI Mouth:  MMM Lungs:  Normal respiratory effort, chest expands symmetrically. Lungs are clear to auscultation, no crackles or wheezes. Heart:  RRR 2/6 SEM stable Abdomen:  BS +, NT, ND Pulses:  2+ Extremities:  no edema   Impression & Recommendations:  Problem # 1:  DIABETES MELLITUS, TYPE II (ICD-250.00) Assessment Improved Will get A1C again today and see if we had any improvementm, per their journal much improvement.  His updated medication list for this problem includes:     Aspir-low 81 Mg Tbec (Aspirin) .Marland Kitchen... 1 tab by mouth once daily    Glucotrol Xl 5 Mg Xr24h-tab (Glipizide) .Marland KitchenMarland KitchenMarland KitchenMarland Kitchen 3 tab by mouth once daily    Januvia 50 Mg Tabs (Sitagliptin phosphate) ..... Once daily  Orders: A1C-[facility] ([address]) Direct LDL-[facility] [phone]) [facility]- Est  Level 4 ([address])  Labs Reviewed: Creat: 1.86 (02/01/2010)    Reviewed HgBA1c results: 10.2 (11/07/2010)  7.6 (06/07/2010)  Problem # 2:  ESSENTIAL HYPERTENSION, BENIGN (ICD-401.1) Will increase amlodipine to 10mg  daily to help with blood pressure gave pt red flags to look out for. Will see again in 1 month for blood pressure check.  The following medications were removed from the medication list:    Amlodipine Besylate 5 Mg Tabs (Amlodipine besylate) .Marland Kitchen... 1 tab daily His updated medication list for this problem includes:  Toprol Xl 100 Mg Xr24h-tab (Metoprolol succinate) .Marland Kitchen... 1 tab by mouth once daily    Furosemide 40 Mg Tabs (Furosemide) ..... One pill daily    Catapres-tts-2 0.2 Mg/24hr Ptwk (Clonidine hcl) .Marland Kitchen... Place on skin and change weekly    Amlodipine Besylate 10 Mg Tabs (Amlodipine besylate) .Marland Kitchen... 1 tab daily  Orders: Direct LDL-[facility] ([address]-98119) Comp Met-[facility] ([address]) [facility]- Est  Level 4 (30865)  Problem # 3:  CONSTIPATION (ICD-564.00) gave miralax two times a day and continuie colace.   Told to back off if develop loose stool.  Pt verbalized understanding. No red flags  His updated medication list for this problem includes:    Colace 100 Mg Caps (Docusate sodium) .Marland Kitchen... 1 tab by mouth two times a day    Miralax Powd (Polyethylene glycol 3350) .Marland Kitchen... 1 capful two times a day for constipation  Orders: [facility]- Est  Level 4 (78469)  Problem # 4:  URI (ICD-465.9) Gave rx of azithro to fill if gets worse, only in head at moment.  Gave red flags for what to look for and when to fill prescription.  His updated medication list for this problem includes:    Aspir-low 81 Mg Tbec (Aspirin) .Marland Kitchen... 1 tab by mouth once  daily  Orders: [facility]- Est  Level 4 ([address])  Complete Medication List: 1)  Aspir-low 81 Mg Tbec (Aspirin) .Marland Kitchen.. 1 tab by mouth once daily 2)  Toprol Xl 100 Mg Xr24h-tab (Metoprolol succinate) .Marland Kitchen.. 1 tab by mouth once daily 3)  Glucotrol Xl 5 Mg Xr24h-tab (Glipizide) .... 3 tab by mouth once daily 4)  Coumadin 5 Mg Tabs (Warfarin sodium) .... As directed 5)  Pravastatin Sodium 40 Mg Tabs (Pravastatin sodium) .Marland Kitchen.. 1 once daily 6)  Ferro-bob 325 (65 Fe) Mg Tabs (Ferrous sulfate) .Marland Kitchen.. 1 tab by mouth once daily 7)  Colace 100 Mg Caps (Docusate sodium) .Marland Kitchen.. 1 tab by mouth two times a day 8)  Januvia 50 Mg Tabs (Sitagliptin phosphate) .... Once daily 9)  Calcium-vitamin D 500-125 Mg-unit Tabs (Calcium-vitamin d) .Marland Kitchen.. 1 two times a day 10)  Furosemide 40 Mg Tabs (Furosemide) .... One pill daily 11)  Vitamin B-12 100 Mcg Tabs (Cyanocobalamin) .Marland Kitchen.. 1 tab daily 12)  Folic Acid 1 Mg Tabs (Folic acid) .Marland Kitchen.. 1 tab daily 13)  Prodigy Blood Glucose Test Strp (Glucose blood) .... Use as directed 14)  Prodigy Twist Top Lancets 28g Misc (Lancets) .... Use as directed 15)  Bd Disp Needles 25g X 5/8" Misc (Needle (disp)) .... Use as directed 16)  Omeprazole 20 Mg Cpdr (Omeprazole) .... Take 1 tab daily 17)  Calcitriol 0.25 Mcg Caps (Calcitriol) .... Take 1 tab by mouth moday, wed, friday. 18)  Catapres-tts-2 0.2 Mg/24hr Ptwk (Clonidine hcl) .... Place on skin and change weekly 19)  Sodium Bicarbonate 648 Mg Tabs (Sodium bicarbonate) 20)  Prodigy Pocket Pro Blood Gluco W/device Kit (Blood glucose monitoring suppl) .... Use as directed 21)  Uloric 80 Mg Tabs (Febuxostat) .Marland Kitchen.. 1 tab daily 22)  Prednisone 5 Mg Tabs (Prednisone) .... One tablet daily as directed  after completing prednisone burst of 40 mg daily for 3 days 23)  Multivitamins Tabs (Multiple vitamin) .Marland Kitchen.. 1 tab daily 24)  Miralax Powd (Polyethylene glycol 3350) .Marland Kitchen.. 1 capful two times a day for constipation 25)  Amlodipine Besylate 10 Mg Tabs  (Amlodipine besylate) .Marland Kitchen.. 1 tab daily  Patient Instructions: 1)  Good to see you 2)  Your sugars are much better, keep doing what you are doing!  3)  We will get labs today likel your A1c 4)  I want you to increase your amlodipine to 10mg  daily.  This will help for your blood pressure 5)  To help with your bowel movements I want you to try miralax 1 capful two times a day.  If you get loose stool back up to 1 x daily 6)  For your cough I will give you a prescription to have on hand.  If you are not better by the weekend I want you to start taking it. 7)  I should probably see you in 1 month to make sure your blood pressure is better.  Prescriptions: AMLODIPINE BESYLATE 10 MG TABS (AMLODIPINE BESYLATE) 1 tab daily  #90 x 3   Entered and Authorized by:   Antoine Primas DO   Signed by:   Antoine Primas DO on 01/18/2011   Method used:   Electronically to        [facility] (retail)       [address]       [place], [place]  [id number]       Ph: [phone]       Fax: [phone]   RxID:   [id number] MIRALAX  POWD (POLYETHYLENE GLYCOL 3350) 1 capful two times a day for constipation  #1 large x 6   Entered and Authorized by:   Antoine Primas DO   Signed by:   Antoine Primas DO on 01/18/2011   Method used:   Electronically to        [facility] (retail)       [address]       [place], [place]  [address]       Ph: [phone]       Fax: [phone]   RxID:   [id number] MULTIVITAMINS  TABS (MULTIPLE VITAMIN) 1 tab daily  #90 x 3   Entered and Authorized by:   Antoine Primas DO   Signed by:   Antoine Primas DO on 01/18/2011   Method used:   Electronically to        [facility] [address] (retail)       [address]       [place], [place]  [id number]       Ph: [phone]       Fax: [phone]   RxID:   [id number]    Orders Added: 1)  A1C-[facility] [83036] 2)  Direct LDL-[facility] [[address]-035[address]] 3)  Comp Met-[facility]  [[address]3818-[address]] 4)  [facility]- Est  Level 4 [16967]

## 2011-01-31 NOTE — Progress Notes (Signed)
Summary: phn msg  Phone Note From Other Clinic Call back at [phone]   [facility] INR- 2.9 PT- 34.2 put previous meds in box and she is going back in 2 weeks Initial call taken by: De Nurse,  January 15, 2011 9:07 AM  Follow-up for Phone Call        no change

## 2011-01-31 NOTE — Assessment & Plan Note (Signed)
Summary: htn, dm, gout   Vital Signs:  Patient profile:   77 year old male Height:      64.5 inches Weight:      146.4 pounds BMI:     24.83 Temp:     98.4 degrees F oral Pulse rate:   62 / minute BP sitting:   149 / 67  (left arm) Cuff size:   regular  Vitals Entered By: Garen Grams LPN (December 11, 2010 2:51 PM) CC: f/u Is Patient Diabetic? Yes Did you bring your meter with you today? No   Primary Care Provider:  Antoine Primas DO  CC:  f/u.  History of Present Illness: 77 yo male here for   1.  gout-  pt has had many flares recently, 3 in the last month.  Pt states it seemed to get worse after coming off the prednisione.  pt is taking the prednisone 5mg  daily now and only 40mg  of the uloric.  Pt was told to take  uloric 80mg  but did not increase it.  Pt is doing wll at present moment.   2. DM  glucose low:113 Glucose high:370 (seems to be increasing in the middle of the day the worse.  Last A1C: see flowsheet Taking Meds:yes On INsulin:no Side effects:no ROS: Denies polyuria, polydipsia, visual changes numbness in extremities, foot ulcers Lifestyle modifications:is seeing a dieticin next week.   3.  BP today:140/67 Taking Meds:yes Side Effects:no ROS: denies Headache visual changes nausea vomiting abdominal pain numbness in extremities        Habits & Providers  Alcohol-Tobacco-Diet     Tobacco Status: never  Current Medications (verified): 1)  Aspir-Low 81 Mg Tbec (Aspirin) .Marland Kitchen.. 1 Tab By Mouth Once Daily 2)  Toprol Xl 100 Mg Xr24h-Tab (Metoprolol Succinate) .Marland Kitchen.. 1 Tab By Mouth Once Daily 3)  Glucotrol Xl 5 Mg Xr24h-Tab (Glipizide) .... 3 Tab By Mouth Once Daily 4)  Coumadin 5 Mg Tabs (Warfarin Sodium) .... As Directed 5)  Pravastatin Sodium 40 Mg Tabs (Pravastatin Sodium) .Marland Kitchen.. 1 Once Daily 6)  Ferro-Bob 325 (65 Fe) Mg Tabs (Ferrous Sulfate) .Marland Kitchen.. 1 Tab By Mouth Once Daily 7)  Colace 100 Mg Caps (Docusate Sodium) .Marland Kitchen.. 1 Tab By Mouth Two Times A Day 8)   Januvia 50 Mg Tabs (Sitagliptin Phosphate) .... Once Daily 9)  Calcium-Vitamin D 500-125 Mg-Unit Tabs (Calcium-Vitamin D) .Marland Kitchen.. 1 Two Times A Day 10)  Furosemide 40 Mg Tabs (Furosemide) .... One Pill Daily 11)  Vitamin B-12 100 Mcg Tabs (Cyanocobalamin) .Marland Kitchen.. 1 Tab Daily 12)  Folic Acid 1 Mg Tabs (Folic Acid) .Marland Kitchen.. 1 Tab Daily 13)  Theragran-M  Tabs (Multiple Vitamins-Minerals) .Marland Kitchen.. 1 Tab Daily 14)  Prodigy Blood Glucose Test  Strp (Glucose Blood) .... Use As Directed 15)  Prodigy Twist Top Lancets 28g  Misc (Lancets) .... Use As Directed 16)  Bd Disp Needles 25g X 5/8" Misc (Needle (Disp)) .... Use As Directed 17)  Omeprazole 20 Mg Cpdr (Omeprazole) .... Take 1 Tab Daily 18)  Calcitriol 0.25 Mcg Caps (Calcitriol) .... Take 1 Tab By Mouth Moday, Wed, Friday. 19)  Catapres-Tts-2 0.2 Mg/24hr Ptwk (Clonidine Hcl) .... Place On Skin and Change Weekly 20)  Amlodipine Besylate 5 Mg Tabs (Amlodipine Besylate) .Marland Kitchen.. 1 Tab Daily 21)  Sodium Bicarbonate 648 Mg Tabs (Sodium Bicarbonate) 22)  Prodigy Pocket Pro Blood Gluco W/device Kit (Blood Glucose Monitoring Suppl) .... Use As Directed 23)  Uloric 80 Mg Tabs (Febuxostat) .Marland Kitchen.. 1 Tab Daily 24)  Prednisone 5 Mg  Tabs (Prednisone) .... One Tablet Daily As Directed  After Completing Prednisone Burst of 40 Mg Daily For 3 Days  Allergies (verified): No Known Drug Allergies  Past History:  Past medical, surgical, family and social histories (including risk factors) reviewed, and no changes noted (except as noted below).  Past Medical History: Reviewed history from 05/30/2010 and no changes required. HTN, T2DM, anemia, ckd, Pt is deaf and talks with translator, ? hx of lupus still need results from [facility] ? of ulcers Lung nodule stable Liver nodle stable- likely should f/u periodically DIVERTICULOSIS OF COLON Hx of OTHER ESOPHAGITIS  UNSPECIFIED DISORDER OF KIDNEY AND URETER PERSONAL HISTORY OF THROMBOPHLEBITIS  SLE CHRONIC KIDNEY DISEASE  UNSPECIFIED  DIABETES MELLITUS, TYPE II  ARTHRITIS, LEFT SHOULDER   Hyperkalemia   Past Surgical History: Reviewed history from 02/01/2010 and no changes required. Right shoulder surgery  remote hx Pt has abdominal scar, likely hernia repair Flu shot in 10/10 Colonoscopy in 2010  Family History: Reviewed history from 02/01/2010 and no changes required. Heart dx in mother DM in both parents Prostate cancer in father  Social History: Reviewed history from 07/13/2010 and no changes required. 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    Review of Systems       see hpi  Physical Exam  General:  Well-developed,well-nourished,in no acute distress; alert,appropriate and cooperative throughout examination Eyes:  PERRLA, EOMI, some catarct seen b/l Mouth:  MMM, uvula midline Lungs:  Normal respiratory effort, chest expands symmetrically. Lungs are clear to auscultation, no crackles or wheezes. Heart:  RRR 2/6 SEM stable Abdomen:  BS +, NT, ND Pulses:  2+ Extremities:  no edema Neurologic:  CN 2-12 intact   Impression & Recommendations:  Problem # 1:  DIABETES MELLITUS, TYPE II (ICD-250.00) increase his glipzide to 15mg  daily to see if can help, will see again in 1 month and may increase again.  Can not increase januvia due to CKD.  If after next increase still having problem would start lantus at a very low dose.  His updated medication list for this problem includes:    Aspir-low 81 Mg Tbec (Aspirin) .Marland Kitchen... 1 tab by mouth once daily    Glucotrol Xl 5 Mg Xr24h-tab (Glipizide) .Marland KitchenMarland KitchenMarland KitchenMarland Kitchen 3 tab by mouth once daily    Januvia 50 Mg Tabs (Sitagliptin phosphate) ..... Once daily  Orders: Glucose Cap-[facility] (19147) [facility]- Est  Level 4 (82956)  Problem # 2:  Hx of PORTAL VEIN THROMBOSIS (ICD-452) INR 4.0 today.  Hold for the next 2 days and then start regular dosing.  Home  health nurse will check on tuesday.  His updated medication list for this problem includes:    Aspir-low 81 Mg Tbec (Aspirin) .Marland Kitchen... 1 tab by mouth once daily    Coumadin 5 Mg Tabs (Warfarin sodium) .Marland Kitchen... As directed  Orders: INR/PT-[facility] (21308)  Problem # 3:  ESSENTIAL HYPERTENSION, BENIGN (ICD-401.1) any further control causes pt to be symnptomatic.  Will monitor do have room to increase amlodipine if needed. Will see again in 1 month for sugar.  His updated medication list for this problem includes:    Toprol Xl 100 Mg Xr24h-tab (Metoprolol succinate) .Marland Kitchen... 1 tab by mouth once daily    Furosemide 40 Mg Tabs (Furosemide) ..... One pill daily    Catapres-tts-2 0.2 Mg/24hr Ptwk (Clonidine hcl) .Marland Kitchen... Place on skin and change weekly  Amlodipine Besylate 5 Mg Tabs (Amlodipine besylate) .Marland Kitchen... 1 tab daily  Orders: [facility]- Est  Level 4 (99214)  Complete Medication List: 1)  Aspir-low 81 Mg Tbec (Aspirin) .Marland Kitchen.. 1 tab by mouth once daily 2)  Toprol Xl 100 Mg Xr24h-tab (Metoprolol succinate) .Marland Kitchen.. 1 tab by mouth once daily 3)  Glucotrol Xl 5 Mg Xr24h-tab (Glipizide) .... 3 tab by mouth once daily 4)  Coumadin 5 Mg Tabs (Warfarin sodium) .... As directed 5)  Pravastatin Sodium 40 Mg Tabs (Pravastatin sodium) .Marland Kitchen.. 1 once daily 6)  Ferro-bob 325 (65 Fe) Mg Tabs (Ferrous sulfate) .Marland Kitchen.. 1 tab by mouth once daily 7)  Colace 100 Mg Caps (Docusate sodium) .Marland Kitchen.. 1 tab by mouth two times a day 8)  Januvia 50 Mg Tabs (Sitagliptin phosphate) .... Once daily 9)  Calcium-vitamin D 500-125 Mg-unit Tabs (Calcium-vitamin d) .Marland Kitchen.. 1 two times a day 10)  Furosemide 40 Mg Tabs (Furosemide) .... One pill daily 11)  Vitamin B-12 100 Mcg Tabs (Cyanocobalamin) .Marland Kitchen.. 1 tab daily 12)  Folic Acid 1 Mg Tabs (Folic acid) .Marland Kitchen.. 1 tab daily 13)  Theragran-m Tabs (Multiple vitamins-minerals) .Marland Kitchen.. 1 tab daily 14)  Prodigy Blood Glucose Test Strp (Glucose blood) .... Use as directed 15)  Prodigy Twist Top Lancets 28g Misc (Lancets)  .... Use as directed 16)  Bd Disp Needles 25g X 5/8" Misc (Needle (disp)) .... Use as directed 17)  Omeprazole 20 Mg Cpdr (Omeprazole) .... Take 1 tab daily 18)  Calcitriol 0.25 Mcg Caps (Calcitriol) .... Take 1 tab by mouth moday, wed, friday. 19)  Catapres-tts-2 0.2 Mg/24hr Ptwk (Clonidine hcl) .... Place on skin and change weekly 20)  Amlodipine Besylate 5 Mg Tabs (Amlodipine besylate) .Marland Kitchen.. 1 tab daily 21)  Sodium Bicarbonate 648 Mg Tabs (Sodium bicarbonate) 22)  Prodigy Pocket Pro Blood Gluco W/device Kit (Blood glucose monitoring suppl) .... Use as directed 23)  Uloric 80 Mg Tabs (Febuxostat) .Marland Kitchen.. 1 tab daily 24)  Prednisone 5 Mg Tabs (Prednisone) .... One tablet daily as directed  after completing prednisone burst of 40 mg daily for 3 days  Patient Instructions: 1)  Good to see you and happy holidays! 2)  I want you to increase your glucotrolXL to 3 pills daily.  Monitor for any signs of shakiness, sweaty or feeling very tired.  If you have any of these symptoms check your blood sugar. 3)  I want you to take 80mg   of uloric a day.  I have sent in a new prescription for you. 4)  I will check your PT INR again today.   5)  I want to see you again in 1 month to make sure your blood sugar is getting better.  Prescriptions: ULORIC 80 MG TABS (FEBUXOSTAT) 1 tab daily  #31 x 3   Entered and Authorized by:   Antoine Primas DO   Signed by:   Antoine Primas DO on 12/11/2010   Method used:   Electronically to        [facility] [address] [address] (retail)       [address]       [place], [place]  [id number]       Ph: [phone]       Fax: [phone]   RxID:   [id number] GLUCOTROL XL 5 MG XR24H-TAB (GLIPIZIDE) 3 tab by mouth once daily  #90 x 1   Entered and Authorized by:   Antoine Primas DO   Signed by:   Antoine Primas DO on 12/11/2010  Method used:   Electronically to        [facility] (retail)       [address]       [place], [place]   [id number]       Ph: [phone]       Fax: [phone]   RxID:   [id number]    Orders Added: 1)  Glucose Cap-[facility] [82948] 2)  INR/PT-[facility] [85610] 3)  [facility]- Est  Level 4 [53664]     ANTICOAGULATION RECORD  NEW REGIMEN & LAB RESULTS Anticoag. Dx: Hx of portal vein thrombosis Current INR: 4.0 Regimen:   (no change)  Provider: Katrinka Blazing Other Comments: ...............test performed by......Marland KitchenBonnie A. [place], MLS (ASCP)cm   Reviewed by: Dr. Michiel Sites

## 2011-01-31 NOTE — Progress Notes (Signed)
Summary: PT/INR  Phone Note Other Incoming Call back at [phone]   Caller: [facility] Summary of Call: PT/INR this week is 3.4 and 22.2. What changes does MD want to make? Initial call taken by: Garen Grams LPN,  December 18, 2010 9:10 AM  Follow-up for Phone Call        can we call [facility] and tell her to hold one day and then continue same regimen.  Thank you Follow-up by: Antoine Primas DO,  December 18, 2010 10:26 AM  Additional Follow-up for Phone Call Additional follow up Details #1::        Tresa Endo informed, will recheck in one week. Additional Follow-up by: Garen Grams LPN,  December 18, 2010 10:29 AM

## 2011-02-01 NOTE — Consult Note (Signed)
Summary: [facility]- Barret's esophagus recheck in 1 yr.   [facility]   Imported By: De Nurse 07/11/2010 10:25:05  _____________________________________________________________________  External Attachment:    Type:   Image     Comment:   External Document  Appended Document: [facility]- Barret's esophagus recheck in 1 yr.     Clinical Lists Changes  Problems: Added new problem of BARRETTS ESOPHAGUS (ICD-530.85) - checked 06/29/10, needs repeat EGD in 1 yr.

## 2011-02-01 NOTE — Consult Note (Signed)
Summary: [facility]- No diabetic retinopathy but + cataracts  [facility]   Imported By: De Nurse 03/07/2010 12:24:40  _____________________________________________________________________  External Attachment:    Type:   Image     Comment:   External Document

## 2011-02-06 NOTE — Progress Notes (Signed)
Summary: phn msg  Phone Note From Other Clinic Call back at [phone]   [facility]: PT - 34.1 INR - 2.8 BP- 172/88 is taking his increased dose of amlodipine she will be back next week to write new orders and she can check it when Katrinka Blazing wants her to - would like to know today Initial call taken by: De Nurse,  January 29, 2011 8:53 AM  Follow-up for Phone Call        called talk to [facility] told her to recheck at next visit the blood pressure. Wil likely need to increase his clonidine patch and may consider bidil.  Follow-up by: Antoine Primas DO,  January 29, 2011 10:02 AM    New/Updated Medications: FUROSEMIDE 40 MG TABS (FUROSEMIDE) 1/2 pill daily Prescriptions: FUROSEMIDE 40 MG TABS (FUROSEMIDE) 1/2 pill daily  #45 x 1   Entered and Authorized by:   Antoine Primas DO   Signed by:   Antoine Primas DO on 01/29/2011   Method used:   Electronically to        [facility] (retail)       [address]       [place], [place]  [id number]       Ph: [phone]       Fax: [phone]   RxID:   [id number]

## 2011-02-06 NOTE — Progress Notes (Signed)
Summary: Phn Msg  Phone Note From Other Clinic Call back at [phone]   Caller: [facility] Summary of Call: Tresa Endo calling to let MD know she was wrong, she does not have to write orders for 2 more weeks, she will see pt on 2/14 & check INR then, if its not ok just call her back.  Initial call taken by: Knox Royalty,  January 29, 2011 12:09 PM

## 2011-02-12 ENCOUNTER — Telehealth: Payer: Self-pay | Admitting: Family Medicine

## 2011-02-12 ENCOUNTER — Other Ambulatory Visit: Payer: Self-pay | Admitting: Family Medicine

## 2011-02-12 NOTE — Telephone Encounter (Signed)
Please review and refill

## 2011-02-12 NOTE — Telephone Encounter (Signed)
Called kelley back told her to do lasix only Monday wednesday and saturday. continue kaexylate Tuesday and Friday. Will consider changing the lasix even more of a decrease at follow up.

## 2011-02-18 ENCOUNTER — Ambulatory Visit: Payer: Self-pay | Admitting: Family Medicine

## 2011-02-26 ENCOUNTER — Other Ambulatory Visit: Payer: Self-pay | Admitting: Family Medicine

## 2011-02-26 NOTE — Telephone Encounter (Signed)
Refill request

## 2011-03-12 ENCOUNTER — Other Ambulatory Visit: Payer: Self-pay | Admitting: Family Medicine

## 2011-03-12 LAB — GLUCOSE, CAPILLARY
Glucose-Capillary: 367 mg/dL — ABNORMAL HIGH (ref 70–99)
Glucose-Capillary: 467 mg/dL — ABNORMAL HIGH (ref 70–99)

## 2011-03-12 NOTE — Telephone Encounter (Signed)
PT- 2.6 INR 31.7  Will see him in 2 weeks or 1 month - pls advise when she should go out.

## 2011-03-12 NOTE — Telephone Encounter (Signed)
Refill request

## 2011-03-22 ENCOUNTER — Telehealth: Payer: Self-pay | Admitting: Family Medicine

## 2011-03-22 NOTE — Telephone Encounter (Signed)
Philip Matthews calling again regarding the INR level for [name]   Need to know if you want it rechecked on Tues 27th.  Please contact her asap and let her know.  Inr was 2.6 and 31.7 last time

## 2011-03-22 NOTE — Telephone Encounter (Signed)
Called Philip Matthews back told her that she can wait a week if she wants or even  Two weeks.

## 2011-03-26 ENCOUNTER — Encounter: Payer: Self-pay | Admitting: Family Medicine

## 2011-03-26 ENCOUNTER — Telehealth: Payer: Self-pay | Admitting: *Deleted

## 2011-03-26 NOTE — Telephone Encounter (Signed)
Kelly from [facility] called and wanted to let Dr.Smith know that the patients INR: 2.5 and the PT:30.4.  She would like to know if you want her to go back out in a week or hold off for one month.  Will forward to Dr.Smith

## 2011-03-26 NOTE — Telephone Encounter (Signed)
Called Philip Matthews back told her we have been doing very well can hold off for the next month.

## 2011-03-27 ENCOUNTER — Ambulatory Visit (INDEPENDENT_AMBULATORY_CARE_PROVIDER_SITE_OTHER): Payer: Medicare Other | Admitting: Family Medicine

## 2011-03-27 ENCOUNTER — Encounter: Payer: Self-pay | Admitting: Family Medicine

## 2011-03-27 DIAGNOSIS — D376 Neoplasm of uncertain behavior of liver, gallbladder and bile ducts: Secondary | ICD-10-CM

## 2011-03-27 DIAGNOSIS — D631 Anemia in chronic kidney disease: Secondary | ICD-10-CM

## 2011-03-27 DIAGNOSIS — N189 Chronic kidney disease, unspecified: Secondary | ICD-10-CM

## 2011-03-27 DIAGNOSIS — N039 Chronic nephritic syndrome with unspecified morphologic changes: Secondary | ICD-10-CM

## 2011-03-27 DIAGNOSIS — M19019 Primary osteoarthritis, unspecified shoulder: Secondary | ICD-10-CM

## 2011-03-27 DIAGNOSIS — I10 Essential (primary) hypertension: Secondary | ICD-10-CM

## 2011-03-27 DIAGNOSIS — E119 Type 2 diabetes mellitus without complications: Secondary | ICD-10-CM

## 2011-03-27 LAB — CBC
HCT: 34 % — ABNORMAL LOW (ref 39.0–52.0)
MCH: 26.1 pg (ref 26.0–34.0)
MCV: 83.7 fL (ref 78.0–100.0)
Platelets: 133 10*3/uL — ABNORMAL LOW (ref 150–400)
RDW: 14.9 % (ref 11.5–15.5)

## 2011-03-27 LAB — BASIC METABOLIC PANEL
BUN: 54 mg/dL — ABNORMAL HIGH (ref 6–23)
Chloride: 102 mEq/L (ref 96–112)
Glucose, Bld: 307 mg/dL — ABNORMAL HIGH (ref 70–99)
Potassium: 4.7 mEq/L (ref 3.5–5.3)
Sodium: 138 mEq/L (ref 135–145)

## 2011-03-27 NOTE — Patient Instructions (Addendum)
I am so happy you are doing well We will schedule a abdominal ultrasound to look at that liver nodule again.  It is scheduled for March 30th at 9am at [facility].  You can call to rescedule if you need to at [phone].  We will draw some labs to check your potassium level to make sure it is doing well Your A1C is  Your blood pressure is perfect If you are taking the furosemide then continue it.  If you are not then leave it off.  Have kelly call me and tell me if it is in your pill box when she sees you again I need to see you again in 3 months. Keep up the good work.

## 2011-03-27 NOTE — Assessment & Plan Note (Signed)
Stable no change in management not affecting activities of daily living

## 2011-03-27 NOTE — Assessment & Plan Note (Addendum)
Will get CBC have not checked in some time also got error code of on A1C so will check and make sure everything is fine.

## 2011-03-27 NOTE — Assessment & Plan Note (Signed)
It has been a year since evaluation will get u/s and make sure no growth.

## 2011-03-27 NOTE — Progress Notes (Signed)
  Subjective:    Patient ID: Philip Matthews, male    DOB: July 13, 1928, 77 y.o.   MRN: [id number]  HPI 1. Hypertension Blood pressure at home:13-0-140 SBP Blood pressure today: 130/75 Taking Meds:yes including clonidine, lasix 20, norvasc 10, toprol 100,  Side effects:no ROS: Denies headache visual changes nausea, vomiting, chest pain or abdominal pain or shortness of breath. No dizziness  2. Diabetes:  High at home:120-130's Low at home:90 Taking medications:yes Side effects:no ROS: denies fever, chills, dizziness, loss of conscieness, polyuria poly dipsia numbness or tingling in extremities or chest pain.  Shoulder pain-  Hx of arthritis, still cannot lift arm over head but the pain is much better controlled not taking anything at this time.   CKD-  Pt is following up with renal has been well controlled, did have problems with high potassium but the kayexylate has helped and is under great control.  Last [facility] Lab Results  Component Value Date   CREATININE 1.28 01/18/2011    Lab Results  Component Value Date   HGBA1C 10.2 11/07/2010       Review of Systems See above.     Objective:   Physical Exam    General:  Well-developed,well-nourished,in no acute distress; alert,appropriate and cooperative throughout examination Eyes:  PERRLA, EOMI, some catarct seen b/l Ears:  PERRL, EOMI Mouth:  MMM Lungs:  Normal respiratory effort, chest expands symmetrically. Lungs are clear to auscultation, no crackles or wheezes. Heart:  RRR 2/6 SEM stable Abdomen:  BS +, NT, ND Pulses:  2+ Extremities:  Trace edema at ankle.     Assessment & Plan:

## 2011-03-27 NOTE — Assessment & Plan Note (Signed)
Will get A1C today, pt appears to be well control, no red flags, will continue current regimen.

## 2011-03-27 NOTE — Assessment & Plan Note (Signed)
At goal will make no changes will get BMET to make sure creatinine is good and potassium is controlled.

## 2011-03-28 ENCOUNTER — Other Ambulatory Visit: Payer: Self-pay | Admitting: Family Medicine

## 2011-03-28 MED ORDER — FUROSEMIDE 40 MG PO TABS: 20.0000 mg | ORAL_TABLET | Freq: Every day | ORAL | Status: DC

## 2011-03-29 ENCOUNTER — Other Ambulatory Visit (HOSPITAL_COMMUNITY): Payer: Medicare Other

## 2011-04-01 ENCOUNTER — Ambulatory Visit (HOSPITAL_COMMUNITY)
Admission: RE | Admit: 2011-04-01 | Discharge: 2011-04-01 | Disposition: A | Discharge status: Home / Self Care | Payer: Medicare Other | Source: Ambulatory Visit | Attending: Family Medicine | Admitting: Family Medicine

## 2011-04-01 DIAGNOSIS — K7689 Other specified diseases of liver: Secondary | ICD-10-CM | POA: Insufficient documentation

## 2011-04-01 DIAGNOSIS — N289 Disorder of kidney and ureter, unspecified: Secondary | ICD-10-CM | POA: Insufficient documentation

## 2011-04-01 DIAGNOSIS — D376 Neoplasm of uncertain behavior of liver, gallbladder and bile ducts: Secondary | ICD-10-CM

## 2011-04-02 ENCOUNTER — Telehealth: Payer: Self-pay | Admitting: Family Medicine

## 2011-04-02 NOTE — Telephone Encounter (Signed)
Called pt to tell pt that ultrasound was normal and that we should probably do another one in about 1 year. Also stated that A1C still significantly elevated and that they should schedule an appointment with Dr. Raymondo Band for insulin therapy teaching. Had to leave message hopefully will call back soon.

## 2011-04-06 LAB — BASIC METABOLIC PANEL
BUN: 48 mg/dL — ABNORMAL HIGH (ref 6–23)
Chloride: 111 mEq/L (ref 96–112)
Creatinine, Ser: 1.85 mg/dL — ABNORMAL HIGH (ref 0.4–1.5)
GFR calc non Af Amer: 36 mL/min — ABNORMAL LOW (ref >60)
Glucose, Bld: 73 mg/dL (ref 70–99)

## 2011-04-06 LAB — GLUCOSE, CAPILLARY: Glucose-Capillary: 65 mg/dL — ABNORMAL LOW (ref 70–99)

## 2011-04-07 LAB — DIFFERENTIAL
Basophils Absolute: 0 10*3/uL (ref 0.0–0.1)
Basophils Relative: 0 % (ref 0–1)
Eosinophils Relative: 3 % (ref 0–5)
Monocytes Absolute: 0.3 10*3/uL (ref 0.1–1.0)

## 2011-04-07 LAB — POCT I-STAT, CHEM 8
Calcium, Ion: 1.21 mmol/L (ref 1.12–1.32)
HCT: 34 % — ABNORMAL LOW (ref 39.0–52.0)
Hemoglobin: 11.6 g/dL — ABNORMAL LOW (ref 13.0–17.0)
Sodium: 140 mEq/L (ref 135–145)
TCO2: 19 mmol/L (ref 0–100)

## 2011-04-07 LAB — CBC
HCT: 32.9 % — ABNORMAL LOW (ref 39.0–52.0)
Hemoglobin: 11 g/dL — ABNORMAL LOW (ref 13.0–17.0)
MCHC: 33.5 g/dL (ref 30.0–36.0)
Platelets: 123 10*3/uL — ABNORMAL LOW (ref 150–400)
RDW: 13.6 % (ref 11.5–15.5)

## 2011-04-09 ENCOUNTER — Telehealth: Payer: Self-pay | Admitting: *Deleted

## 2011-04-09 ENCOUNTER — Other Ambulatory Visit: Payer: Self-pay | Admitting: Family Medicine

## 2011-04-09 NOTE — Telephone Encounter (Signed)
recieved call from [facility] reporting PT/ INR.  PT 55.3, INR 4.6. She reports patient has been taking coumadin 5 mg.  5 days and 7.5 mg the other days. Dr. Katrinka Blazing notified and he advises to hold coumadin  for 2 days then start back at 5 mg daily.  Check Pt / INR in one week. [name] notified and she will notify patient.

## 2011-04-09 NOTE — Telephone Encounter (Signed)
refill request

## 2011-04-11 ENCOUNTER — Ambulatory Visit (INDEPENDENT_AMBULATORY_CARE_PROVIDER_SITE_OTHER): Payer: Medicare Other

## 2011-04-11 ENCOUNTER — Inpatient Hospital Stay (INDEPENDENT_AMBULATORY_CARE_PROVIDER_SITE_OTHER)
Admission: RE | Admit: 2011-04-11 | Discharge: 2011-04-11 | Disposition: A | Discharge status: Home / Self Care | Payer: Medicare Other | Source: Ambulatory Visit | Attending: Family Medicine | Admitting: Family Medicine

## 2011-04-11 DIAGNOSIS — S5010XA Contusion of unspecified forearm, initial encounter: Secondary | ICD-10-CM

## 2011-04-23 ENCOUNTER — Ambulatory Visit (INDEPENDENT_AMBULATORY_CARE_PROVIDER_SITE_OTHER): Payer: Medicare Other | Admitting: Pharmacist

## 2011-04-23 ENCOUNTER — Other Ambulatory Visit: Payer: Self-pay | Admitting: Family Medicine

## 2011-04-23 ENCOUNTER — Encounter: Payer: Self-pay | Admitting: Pharmacist

## 2011-04-23 ENCOUNTER — Telehealth: Payer: Self-pay | Admitting: Family Medicine

## 2011-04-23 VITALS — BP 134/72 | Ht 64.5 in | Wt 148.8 lb

## 2011-04-23 DIAGNOSIS — E119 Type 2 diabetes mellitus without complications: Secondary | ICD-10-CM

## 2011-04-23 NOTE — Patient Instructions (Signed)
1)  Start checking blood sugars 2 hours after each meal every day.  Check blood sugar before bedtime 2 times a week. 2)  The goal for blood sugar 2 hours after meal is less 180.  Your before meal blood sugars are GREAT. 3)  Return to pharmacy clinic in 2-3 weeks and we will review your blood sugar log and discuss start of meal time insulin.

## 2011-04-23 NOTE — Progress Notes (Signed)
  Subjective:    Patient ID: Philip Matthews, male    DOB: 06/09/1928, 77 y.o.   MRN: [id number]  HPI Reviewed and agree with Dr. Koval's management.    Review of Systems     Objective:   Physical Exam        Assessment & Plan:   

## 2011-04-23 NOTE — Telephone Encounter (Signed)
INR- 2.5   PT 30.5 Please let her know if she needs to go sooner than 2 weeks.

## 2011-04-23 NOTE — Telephone Encounter (Signed)
Will forward to MD.  

## 2011-04-23 NOTE — Telephone Encounter (Signed)
Refill request

## 2011-04-23 NOTE — Assessment & Plan Note (Signed)
A: Patient with uncontrolled DM2 based on A1C 10.2.  Patient and daughter appeared confused with this high A1C considering controlled preprandial CBGs (100-150).  Patient does not have record of postprandial numbers and most likely, his meal time insulin is impaired, causing CBGs in the high 200s, possibly 300s.  Patient does not want to start any type of injections but understood that this is most likely the path to his control.  P:  Begin recording CBGs 2 hours after each meal everyday and 2-3x per week at bedtime.  Discussed the use of a insulin pen for meal time coverage and was comfortable with that idea as opposed to using syringes and vials.  Plan to return to pharmacy clinic in 2 weeks to evaluate CBGs and discuss need to insulin.  Started insulin injection education today but will need a more elaborate insulin education at next visit.  Time with patient: 30 minutes Seen with Clance Boll, PharmD

## 2011-04-23 NOTE — Progress Notes (Signed)
  Subjective:    Patient ID: Philip Matthews, male    DOB: 1928-10-05, 77 y.o.   MRN: [id number]  HPI Patient arrives with daughter in good spirits.  Both are hearing impaired and visit was performed with a sign language interpreter.    Daughter keeps a log of preprandial CBGs before breakfast and before dinner.  These numbers range from 100-150.  They were both aware of the recent A1C~10 and were confused why this number is so high when the home CBGs appear controlled.  Patient is on Glipizide alone and is opposed to beginning any type of injections.  Both patient and daughter are concerned about controlling his DM.   Review of Systems     Objective:   Physical Exam        Assessment & Plan:

## 2011-05-07 ENCOUNTER — Other Ambulatory Visit: Payer: Self-pay | Admitting: Family Medicine

## 2011-05-07 ENCOUNTER — Telehealth: Payer: Self-pay | Admitting: Family Medicine

## 2011-05-07 NOTE — Telephone Encounter (Signed)
Refill request

## 2011-05-07 NOTE — Telephone Encounter (Signed)
PT - 32.5 INR- 2.7  Dr Dareen Piano decreased his Prednisone to 2.5mg  for 30 days and then to stop.

## 2011-05-10 ENCOUNTER — Ambulatory Visit (INDEPENDENT_AMBULATORY_CARE_PROVIDER_SITE_OTHER): Payer: Medicare Other | Admitting: Pharmacist

## 2011-05-10 VITALS — BP 168/77 | HR 57 | Ht 64.0 in | Wt 151.0 lb

## 2011-05-10 DIAGNOSIS — E119 Type 2 diabetes mellitus without complications: Secondary | ICD-10-CM

## 2011-05-10 DIAGNOSIS — M109 Gout, unspecified: Secondary | ICD-10-CM

## 2011-05-10 NOTE — Patient Instructions (Addendum)
1) Decrease you glipizide (glucotrol) to 2 pills daily in the AM 2) Start your NOVOLOG insulin injection 2 units prior to breakfast, 4 units prior to lunch, and 2 units prior to dinner.  3) Remember that you must eat after taking this insulin.  Do NOT take your insulin and skip a meal.  4) Keep exercising.  5) We will continue to work with you to adjust your insulin over the next few weeks.  6) You WILL be an expert in this in the next few days.

## 2011-05-10 NOTE — Progress Notes (Signed)
  Subjective:    Patient ID: Philip Matthews, male    DOB: 1928/01/26, 77 y.o.   MRN: [id number]  HPI  Philip Matthews arrives today accompanied by his daughter and an interpretor to discuss diabetes management. They bring along their blood sugar log. They have been checking postprandial levels 2 hours after meals. The levels are ranging from 200-500 which is more reflective of his last A1c level.  Patient and daughter are very hesitant with initiated insulin.  Patient reports low carbohydrate meals for breakfast and dinner, but has a carb-heavy meal for lunch everyday at the [facility] (which occasionally has [employer] cater).  His estimated carbohydrate intake at lunch is 45-60g each day, and this is reflected in the high post-prandial CBGs at lunch (250-450).   Review of Systems     Objective:   Physical Exam        Assessment & Plan:  Diabetes A: Post-prandial CBGs are uncontrolled with CBGs 200-300 after breakfast, 270-450 after lunch, and 170-250 after dinner.  Patient had one low CBG reported at 55 after dinner, but could not remember what he ate for dinner or what type of activity he was doing prior (walking, biking).  Pre-prandial insulins are controlled on januvia and glipizide.  Patient appears to have the dexterity to use insulin pen and demonstrated use of injecting insulin with sample supplies 2x during the visit.  P: Begin meal time insulin with Novolog pen: 2 units at breakfast, 4 units at lunch, and 2 units at dinner.  Continue Januvia at current dose, decrease glipizide to 2 tablets (10mg ) daily.  Continue to record 2-hour post-prandial CBGs for each meal.  Return to clinic in 2-3 weeks.  Patient was educated on insulin use and safety.  Instructed to call clinic if has 2 CBGs less than 60 in a week  Gout A: The patient has a history of gout and has been on prednisone 5 mg for a little over a year now. He is also taking Uloric every other day (unknown reason for this  particular dosing). He has no uric acid level on record and is currently not complaining of pain. In order to help with his diabetes management and elevated A1c, his prednisone dose was cut in half at his visit with his PCP. The plan is to continue prednisone 2.5 mg for 30 days and then stop completely.  P: Continue Uloric 80 mg every other day. Monitor for signs/symptoms of gout flare while on decreased prednisone dose and as it is discontinued. Consider checking a uric acid level at the next PCP visit.

## 2011-05-10 NOTE — Assessment & Plan Note (Addendum)
A: Post-prandial CBGs are uncontrolled with CBGs 200-300 after breakfast, 270-450 after lunch, and 170-250 after dinner.  Patient had one low CBG reported at 55 after dinner, but could not remember what he ate for dinner or what type of activity he was doing prior (walking, biking).  Pre-prandial insulins are controlled on januvia and glipizide.  Patient appears to have the   P: Begin meal time insulin with Novolog pen: 2 units at breakfast, 4 units at lunch, and 2 units at dinner.  Continue Januvia at current dose, decrease glipizide to 2 tablets (10mg ) daily.  Continue to record 2-hour post-prandial CBGs for each meal.  Return to clinic in 2-3 weeks.  Patient was educated on insulin use and safety.  Instructed to call clinic if has 2 CBGs less than 60 in a week.  Time spent with patient: 45 minutes Patient seen with Clance Boll, PharmD.

## 2011-05-10 NOTE — Progress Notes (Signed)
  Subjective:    Patient ID: Philip Matthews, male    DOB: [birth year], 77 y.o.   MRN: [id number]  HPI Reviewed and agree with Dr. Macky Lower management.    Review of Systems     Objective:   Physical Exam        Assessment & Plan:

## 2011-05-10 NOTE — Assessment & Plan Note (Signed)
A: The patient has a history of gout and has been on prednisone 5 mg for a little over a year now. He is also taking Uloric every other day (unknown reason for this particular dosing). He has no uric acid level on record and is currently not complaining of pain. In order to help with his diabetes management and elevated A1c, his prednisone dose was cut in half at his visit with his PCP. The plan is to continue prednisone 2.5 mg for 30 days and then stop completely.  P: Continue Uloric 80 mg every other day. Monitor for signs/symptoms of gout flare while on decreased prednisone dose and as it is discontinued. Consider checking a uric acid level at the next PCP visit.

## 2011-05-14 ENCOUNTER — Encounter: Payer: Self-pay | Admitting: Sports Medicine

## 2011-05-17 NOTE — Telephone Encounter (Signed)
Needs to know when to recheck his INR - please call

## 2011-05-21 ENCOUNTER — Other Ambulatory Visit: Payer: Self-pay | Admitting: Family Medicine

## 2011-05-21 ENCOUNTER — Telehealth: Payer: Self-pay | Admitting: *Deleted

## 2011-05-21 NOTE — Telephone Encounter (Signed)
[facility] nurse called, reports INR 2.8 AND PT 33.7. Needs order for coumadin dose. Arlyss Repress Paged Dr.Smith

## 2011-05-21 NOTE — Telephone Encounter (Signed)
As MD is out of town I asked preceptor Burchette, he advises that these numbers are fine and the dose can stay the same.  Nyoka Lint [facility]) informed and agreeable Illene Sweeting, [name]

## 2011-05-21 NOTE — Telephone Encounter (Signed)
Refill request

## 2011-05-22 NOTE — Telephone Encounter (Signed)
Made changes based on Dr. Raymondo Band recommendations. Refilled

## 2011-05-28 ENCOUNTER — Ambulatory Visit: Payer: Medicare Other | Admitting: Pharmacist

## 2011-06-04 ENCOUNTER — Other Ambulatory Visit: Payer: Self-pay | Admitting: Cardiovascular Disease

## 2011-06-04 ENCOUNTER — Telehealth: Payer: Self-pay | Admitting: Family Medicine

## 2011-06-04 NOTE — Telephone Encounter (Signed)
Called kelly and left message that 2 weeks would be fine and will restock meds.

## 2011-06-04 NOTE — Telephone Encounter (Signed)
Reports INR - 2.9 & PT 34.8 Plans on rechecking again in 2 weeks or sooner - please call to confirm -  Also needs new orders (verbal) to restock meds

## 2011-06-16 ENCOUNTER — Other Ambulatory Visit: Payer: Self-pay | Admitting: Family Medicine

## 2011-06-17 NOTE — Telephone Encounter (Signed)
Refill request

## 2011-06-18 ENCOUNTER — Telehealth: Payer: Self-pay | Admitting: Family Medicine

## 2011-06-18 ENCOUNTER — Other Ambulatory Visit: Payer: Self-pay | Admitting: Family Medicine

## 2011-06-18 NOTE — Telephone Encounter (Signed)
Refill request

## 2011-06-18 NOTE — Telephone Encounter (Signed)
Kelly from [facility] called in to give Philip Matthews's INR and  protime results for today.  INR was 2.5 and protime was 30.7.  She will check again in 2 wks.  Please call her if need to ask for any further information.

## 2011-06-18 NOTE — Telephone Encounter (Signed)
Agree with plan no changes needed

## 2011-06-25 ENCOUNTER — Ambulatory Visit
Admission: RE | Admit: 2011-06-25 | Discharge: 2011-06-25 | Disposition: A | Discharge status: Home / Self Care | Payer: Medicare Other | Source: Ambulatory Visit | Attending: Family Medicine | Admitting: Family Medicine

## 2011-06-25 ENCOUNTER — Encounter: Payer: Self-pay | Admitting: Pharmacist

## 2011-06-25 ENCOUNTER — Ambulatory Visit (INDEPENDENT_AMBULATORY_CARE_PROVIDER_SITE_OTHER): Payer: Medicare Other | Admitting: Pharmacist

## 2011-06-25 ENCOUNTER — Other Ambulatory Visit: Payer: Self-pay | Admitting: Family Medicine

## 2011-06-25 VITALS — Ht 64.5 in | Wt 152.0 lb

## 2011-06-25 DIAGNOSIS — M79673 Pain in unspecified foot: Secondary | ICD-10-CM

## 2011-06-25 DIAGNOSIS — E119 Type 2 diabetes mellitus without complications: Secondary | ICD-10-CM

## 2011-06-25 DIAGNOSIS — M79672 Pain in left foot: Secondary | ICD-10-CM

## 2011-06-25 DIAGNOSIS — M79609 Pain in unspecified limb: Secondary | ICD-10-CM

## 2011-06-25 MED ORDER — ACETAMINOPHEN 500 MG PO TABS
500.0000 mg | ORAL_TABLET | Freq: Once | ORAL | Status: AC
  Administered 2011-06-25: 500 mg via ORAL

## 2011-06-25 NOTE — Progress Notes (Signed)
  Subjective:    Patient ID: Philip Matthews, male    DOB: 1928/10/07, 77 y.o.   MRN: [id number]  HPI  Patient arrives with daughter and sign language interpreter Huntley Dec).   Patient reports tolerating injections well except for small bruising with injection intermittently.   He denies reusing needle tips AND was encouraged to perform injection perpendicular to the skin.   CGB  (2 hour post meal) is consistently < 200 majority of reaings 100-150) AND patient reports decreased nocturia.   Likely improved control due to use of short-acting prandial insulin.  Patient's daughter is happy with control.   Patient experienced trama to left foot after "twisting his foot" in a [place] drain grate along the curb when walking along the street on Friday 6/22 -(4 days ago).   He managed it with elevation, ice and attempted to walk less.   Despite these efforts he complains of a small area of swelling and report pain of 10/10.   Review of Systems     Objective:   Physical Exam    Deferred to Dr. Alvester Morin and Jennette Kettle.     Assessment & Plan:  Diabetes: A/P Improved glycemic control with use of prandial insulin (Novolog) Pen.   Patient denies hypoglycemia and feels he has less nocturia since starting insulin.   Continue Glipizide and Novolog at this time.   STOP Januvia and reassess glycemic control off this medication at next visit with Dr. Terrilee Files.   Left Foot Trauma: A/P Deferred to Dr. Alvester Morin.   Plan to get foot X-ray today AND return to clinic for further management this afternoon.

## 2011-06-25 NOTE — Patient Instructions (Signed)
Stop Januvia tomorrow.  Continue to check blood sugars as previously.  Follow up with Dr. Terrilee Files at interval selected by Dr. Alvester Morin.  Follow up with Dr Alvester Morin this afternoon.

## 2011-06-25 NOTE — Assessment & Plan Note (Signed)
Diabetes: A/P Improved glycemic control with use of prandial insulin (Novolog) Pen.   Patient denies hypoglycemia and feels he has less nocturia since starting insulin.   Continue Glipizide and Novolog at this time.   STOP Januvia and reassess glycemic control off this medication at next visit with Dr. Terrilee Files.  Total time for DM management 15 minutes.

## 2011-06-25 NOTE — Progress Notes (Signed)
Addended by: Burna Mortimer E on: 06/25/2011 11:28 AM   Modules accepted: Orders

## 2011-06-27 ENCOUNTER — Encounter: Payer: Self-pay | Admitting: Cardiovascular Disease

## 2011-07-02 ENCOUNTER — Telehealth: Payer: Self-pay | Admitting: Family Medicine

## 2011-07-02 ENCOUNTER — Other Ambulatory Visit: Payer: Self-pay | Admitting: *Deleted

## 2011-07-02 DIAGNOSIS — E119 Type 2 diabetes mellitus without complications: Secondary | ICD-10-CM

## 2011-07-02 MED ORDER — INSULIN ASPART 100 UNIT/ML ~~LOC~~ SOLN: SUBCUTANEOUS | Status: DC

## 2011-07-02 NOTE — Telephone Encounter (Signed)
Reported PT - 35.9 & INR- 2.9 - pls let her know if she should wait to come back in 2 weeks.  Also wanted to know about foot - he had xrays last week and his foot is still swollen and painful.  I told her that they can be seen on Thurs. and I will call to make them an appt.

## 2011-07-02 NOTE — Telephone Encounter (Signed)
Called told her two weeks and told her RICE therapy for foot and can be seen if needed.

## 2011-07-05 ENCOUNTER — Encounter: Payer: Self-pay | Admitting: Family Medicine

## 2011-07-05 ENCOUNTER — Ambulatory Visit (INDEPENDENT_AMBULATORY_CARE_PROVIDER_SITE_OTHER): Payer: Medicare Other | Admitting: Family Medicine

## 2011-07-05 DIAGNOSIS — M79673 Pain in unspecified foot: Secondary | ICD-10-CM

## 2011-07-05 DIAGNOSIS — M79609 Pain in unspecified limb: Secondary | ICD-10-CM

## 2011-07-05 DIAGNOSIS — I10 Essential (primary) hypertension: Secondary | ICD-10-CM

## 2011-07-05 DIAGNOSIS — E119 Type 2 diabetes mellitus without complications: Secondary | ICD-10-CM

## 2011-07-05 LAB — POCT GLYCOSYLATED HEMOGLOBIN (HGB A1C): Hemoglobin A1C: 9

## 2011-07-05 NOTE — Progress Notes (Signed)
  Subjective:    Patient ID: Philip Matthews, male    DOB: 1928-05-01, 77 y.o.   MRN: [id number]  Foot Pain   1. Hypertension Blood pressure at home:130-140 SBP Blood pressure today: 130/70 Taking Meds:yes including clonidine, lasix 20, norvasc 10, toprol 100,  Side effects:no ROS: Denies headache visual changes nausea, vomiting, chest pain or abdominal pain or shortness of breath. No dizziness  2. Diabetes:  High at home:120-130's Low at home:90 Taking medications:yes started on insulin and has been seen by Dr. Raymondo Band, pt been keeping log of postprandial and all have been 120-170.  No 200's.  Pt is doing well no lows either.  Side effects:no ROS: denies fever, chills, dizziness, loss of conscieness, polyuria poly dipsia numbness or tingling in extremities or chest pain.  Foot pain-  Pt had an injury of right foot about 2 weeks ago, was seen here had an x-ray no fracture seen but since then has had a mass on the dorsal aspect of foot that is tender to palpation and hurts in show, has not done anything for it.  May be getting better slowly but not fast enough per pt. Can bear weight, has not tried anything but some ice from time to time.  Is on coumadin.   CKD-  Pt is following up with renal has been well controlled, did have problems with high potassium but the kayexylate has helped and is under great control.  Last Creatnine.  Will see Dr. Allena Katz again in January.  Lab Results  Component Value Date   CREATININE 1.76* 03/27/2011    Lab Results  Component Value Date   HGBA1C 9.0 07/05/2011     Review of Systems  See above.    Past medical history, social, surgical and family history all reviewed.   Objective:   Physical Exam     General:  Well-developed,well-nourished,in no acute distress; alert,appropriate and cooperative throughout examination Eyes:  PERRLA, EOMI, some catarct seen b/l Mouth:  MMM Lungs:  Normal respiratory effort, chest expands symmetrically. Lungs are clear  to auscultation, no crackles or wheezes. Heart:  RRR 2/6 SEM stable Abdomen:  BS +, NT, ND Pulses:  2+ Extremities:  Trace edema at ankle. Right ankle and foot has a 1.5 cm fluctuant post injury hematoma on dorsal aspect of foot tener to palpates, no redness no bleeding no skin breakdown.     Assessment & Plan:

## 2011-07-06 DIAGNOSIS — M79673 Pain in unspecified foot: Secondary | ICD-10-CM | POA: Insufficient documentation

## 2011-07-06 NOTE — Assessment & Plan Note (Signed)
Improved A1c postprandial seem to be improved as well. No need to make adjustment at this time.

## 2011-07-06 NOTE — Assessment & Plan Note (Signed)
Near goal, no need to make much of a difference at this time. Continue to monitor kidney function and any end organ failure

## 2011-07-06 NOTE — Patient Instructions (Signed)
Pt given verbal orders

## 2011-07-06 NOTE — Assessment & Plan Note (Signed)
Pt is having foot pain likely secondary to hematoma with pt on coumadin.  Pt given wrap at this time, told about elevation and if not better to return in 2 weeks.  Pt though could not have any intervention at this time due to pt having incision could have worsen the bleeding, if return could consider steroid injection.

## 2011-07-16 ENCOUNTER — Telehealth: Payer: Self-pay | Admitting: Family Medicine

## 2011-07-16 ENCOUNTER — Other Ambulatory Visit: Payer: Self-pay | Admitting: Family Medicine

## 2011-07-16 NOTE — Telephone Encounter (Signed)
Refill request

## 2011-07-16 NOTE — Telephone Encounter (Signed)
Reported PT- 31.7 & INR- 2.6 He had missed 1 pill - she refilled his box for 2 more weeks.

## 2011-07-16 NOTE — Telephone Encounter (Signed)
To MD

## 2011-07-16 NOTE — Telephone Encounter (Signed)
No changes needed sounds good check again in 2 weeks.

## 2011-07-30 ENCOUNTER — Telehealth: Payer: Self-pay | Admitting: Family Medicine

## 2011-07-30 NOTE — Telephone Encounter (Signed)
Have already told Angelique Blonder to tell Philip Matthews to hold today then regular regimen and check in 2 weeks.

## 2011-07-30 NOTE — Telephone Encounter (Signed)
Called to say that his INR is 3.2 (he had been drinking this weekend)  Wanted to know if she should hold his meds for tonight and then restart.

## 2011-08-13 ENCOUNTER — Other Ambulatory Visit: Payer: Self-pay | Admitting: Family Medicine

## 2011-08-13 ENCOUNTER — Telehealth: Payer: Self-pay | Admitting: *Deleted

## 2011-08-13 MED ORDER — INSULIN PEN NEEDLE 32G X 6 MM MISC: 1.0000 | Freq: Three times a day (TID) | Status: DC | PRN

## 2011-08-13 NOTE — Telephone Encounter (Signed)
Will have pt hold for two days and then do regular dosing will check again in 2 weeks.

## 2011-08-13 NOTE — Telephone Encounter (Signed)
[facility] pharmacy asking for Rx on Novafine 32G needles for his flexpen.  States he had received samples and would now like to have them, they need Rx send electronically

## 2011-08-13 NOTE — Telephone Encounter (Signed)
Home health RN called to report the following:  INR - 4.2 PT - 50.4  Wanting to know dose of Coumadin.  She can be reached at [phone].

## 2011-08-13 NOTE — Telephone Encounter (Signed)
Done sent in but could be consider OTC

## 2011-08-13 NOTE — Telephone Encounter (Signed)
Refill request

## 2011-08-27 ENCOUNTER — Telehealth: Payer: Self-pay | Admitting: *Deleted

## 2011-08-27 NOTE — Telephone Encounter (Signed)
Tresa Endo called to let us know pt INR was 5.4, he had skipped 2 days previously and wanted to let MD know the his uloric was changed to every other day. His dosage now is 7.5 on mondays and fridays and 5 every other day. Spoke with Terrilee Files, DO he wants to skip for two days and then take 5mg s everyday then recheck in one week.  Tresa Endo informed and agreeable. Jacoba Cherney, [name]

## 2011-08-30 ENCOUNTER — Telehealth: Payer: Self-pay | Admitting: *Deleted

## 2011-08-30 NOTE — Telephone Encounter (Signed)
PA required for Novolog Flexpen. Preferred is Humolog Ouikpen. PA form placed in MD box.

## 2011-09-03 ENCOUNTER — Telehealth: Payer: Self-pay | Admitting: Family Medicine

## 2011-09-03 NOTE — Telephone Encounter (Signed)
Nurse - Tresa Endo from [facility] has lab results for Dr. Katrinka Blazing.  INR - 2.6 & Protime - 31.4.  Has 5mg  of Coumadin in med box each day.  Please call her to let her know when to draw again.

## 2011-09-03 NOTE — Telephone Encounter (Signed)
Called Kelly back. I told her to recheck INR in 2 weeks

## 2011-09-03 NOTE — Telephone Encounter (Signed)
Yes--next week would be fine

## 2011-09-03 NOTE — Telephone Encounter (Signed)
Form faxed to insurance 

## 2011-09-03 NOTE — Telephone Encounter (Signed)
Returned to [employer], form filled out.

## 2011-09-03 NOTE — Telephone Encounter (Signed)
Philip Matthews is calling back asking if she can recheck PT/INR next week since it is the normal week and then go to every 2 weeks if everything is ok?  If that is ok, she can just write it up.

## 2011-09-04 NOTE — Telephone Encounter (Signed)
Kelly informed

## 2011-09-04 NOTE — Telephone Encounter (Signed)
Approval received from insurance company for the [employer]. Pharmacy notified.

## 2011-09-10 ENCOUNTER — Telehealth: Payer: Self-pay | Admitting: Family Medicine

## 2011-09-10 NOTE — Telephone Encounter (Signed)
PT- 26.5 INR- 2.2 Refilled meds the same

## 2011-09-23 ENCOUNTER — Other Ambulatory Visit: Payer: Self-pay | Admitting: Family Medicine

## 2011-09-23 NOTE — Telephone Encounter (Signed)
Refill request

## 2011-09-24 ENCOUNTER — Other Ambulatory Visit: Payer: Self-pay | Admitting: Family Medicine

## 2011-09-24 ENCOUNTER — Telehealth: Payer: Self-pay | Admitting: Family Medicine

## 2011-09-24 MED ORDER — HYDRALAZINE HCL 25 MG PO TABS: 25.0000 mg | ORAL_TABLET | Freq: Three times a day (TID) | ORAL | Status: DC

## 2011-09-24 MED ORDER — EPOETIN ALFA 10000 UNIT/ML IJ SOLN: 10000.0000 [IU] | INTRAMUSCULAR | Status: AC

## 2011-09-24 NOTE — Telephone Encounter (Signed)
PT/INR - INR - 3.0 - Put 5mg  in each box and will check in 2 weeks unless Dr. Katrinka Blazing determines to change.  He has 3 new meds changes - per Dr. Allena Katz - Procrit - 10,000 units sq q 2 wks, Hydralazine - 25mg  3 x daily, Lasix increased to 40mg  M - W - Sa only.  Please call her if there are any questions.

## 2011-09-24 NOTE — Telephone Encounter (Signed)
No changes needed added medications to list

## 2011-09-24 NOTE — Telephone Encounter (Signed)
Refill request

## 2011-10-01 ENCOUNTER — Ambulatory Visit (INDEPENDENT_AMBULATORY_CARE_PROVIDER_SITE_OTHER): Payer: Medicare Other

## 2011-10-01 ENCOUNTER — Other Ambulatory Visit: Payer: Self-pay | Admitting: Family Medicine

## 2011-10-01 DIAGNOSIS — Z23 Encounter for immunization: Secondary | ICD-10-CM

## 2011-10-01 MED ORDER — EASY TOUCH LANCETS MISC: Status: DC

## 2011-10-01 MED ORDER — ZOSTER VACCINE LIVE 19400 UNT/0.65ML ~~LOC~~ SOLR: 0.6500 mL | Freq: Once | SUBCUTANEOUS | Status: DC

## 2011-10-01 MED ORDER — GLUCOSE BLOOD VI STRP: ORAL_STRIP | Status: DC

## 2011-10-01 NOTE — Telephone Encounter (Signed)
Refill request

## 2011-10-01 NOTE — Telephone Encounter (Signed)
Pharmacy needs a new Rx for an Accuchek Aviva Glucose Meter with test strips and lancets.  On Rx please indicate directions on how many times he needs to check his blood glucose along with a dx code.  They are no longer making test strips for the meter Mr. Lady currently has.  Ileana Ladd

## 2011-10-01 NOTE — Telephone Encounter (Signed)
Done sent in but no one touch ultra so had to write it in.  Hopefully worked.

## 2011-10-01 NOTE — Telephone Encounter (Signed)
Daughter  came into office asking about getting patient a new glucose meter , strips and lancets.. I called pharmacy and it is actually the One Touch Ultra meter that medicare will pay for.  Will ask Dr. Katrinka Blazing to send in RX for strips and lancets. I will give patient a glucose meter that we have available here for medicare patients.  also daughter is interested in getting rx for zostvax for patient . Will ask MD to please send this in also to pharmacy.

## 2011-10-02 DIAGNOSIS — Z23 Encounter for immunization: Secondary | ICD-10-CM

## 2011-10-02 NOTE — Telephone Encounter (Signed)
Gave patient a glucose meter and instructions to patient and daughter. Blood sugar at 5:00 pm was 373. Dr. Raymondo Band met patient on way out and advised them to return for appointment to follow up on  glucose management.

## 2011-10-07 ENCOUNTER — Encounter: Payer: Self-pay | Admitting: Pharmacist

## 2011-10-07 ENCOUNTER — Ambulatory Visit (INDEPENDENT_AMBULATORY_CARE_PROVIDER_SITE_OTHER): Payer: Medicare Other | Admitting: Pharmacist

## 2011-10-07 VITALS — BP 161/75 | HR 81 | Ht 64.5 in | Wt 151.6 lb

## 2011-10-07 DIAGNOSIS — I81 Portal vein thrombosis: Secondary | ICD-10-CM

## 2011-10-07 DIAGNOSIS — R799 Abnormal finding of blood chemistry, unspecified: Secondary | ICD-10-CM

## 2011-10-07 DIAGNOSIS — E119 Type 2 diabetes mellitus without complications: Secondary | ICD-10-CM

## 2011-10-07 DIAGNOSIS — Z8672 Personal history of thrombophlebitis: Secondary | ICD-10-CM

## 2011-10-07 LAB — POCT GLYCOSYLATED HEMOGLOBIN (HGB A1C): Hemoglobin A1C: 8.7

## 2011-10-07 MED ORDER — GLIPIZIDE ER 10 MG PO TB24: 10.0000 mg | ORAL_TABLET | Freq: Every day | ORAL | Status: DC

## 2011-10-07 MED ORDER — INSULIN ASPART 100 UNIT/ML ~~LOC~~ SOLN: SUBCUTANEOUS | Status: DC

## 2011-10-07 NOTE — Progress Notes (Signed)
  Subjective:    Patient ID: Philip Matthews, male    DOB: August 03, 1928, 77 y.o.   MRN: [id number]  HPI Pt arrives accompanied by his daughter and a sign-language interpretor. Pt appears in good state of health. Pt came in today due to recent occasional elevation in BG.  He has recently stopped Januvia, and is currently taking 2/4/2 units of novolog prior to breakfast, lunch, and dinner.   Review of Systems     Objective:   Physical Exam Last A1c  07/05/11= 9.0 Today 10/07/11: 8.7       Assessment & Plan:   Diabetes of long-standing duration currently under fair control of blood glucose based on   Lab Results  Component Value Date   HGBA1C 9.0 07/05/2011    ,home fasting and preprandial CBG readings of 150-250. Control is suboptimal due to diet and insulin dose. Denies hypoglycemic events.  Able to verbalize appropriate hypoglycemia management plan. Increased dose of rapid insulin Novolog (insulin aspart) to 3/5/3 units prior to breakfast, lunch, and dinner.  Pt's glipizide regimen was simplified from 2 tabs of 5mg  XL together daily to 1 tab 10mg  XL once daily.  Pt was counseled to check cBGs every morning and also prior to evening meal, with option to check at other times throughout the day.   Pt's INR today is 2.0, within target range of 2.0-3.0.  Pt counseled to continue Coumadin as scheduled.  Pt mentioned endoscopy procedure 10/17 and has been told to stop his Coumadin 5 days prior to procedure.  Next INR visit will be in 2 weeks from home health agency.   Written patient instructions provided.  Follow up in  Pharmacist Clinic Visit 1 month.   TTFFC 40 minutes.  Patient seen with Adrian Blackwater, PharmD Candidate and Haynes Hoehn, Pharmacy Resident .

## 2011-10-07 NOTE — Progress Notes (Signed)
  Subjective:    Patient ID: Philip Matthews, male    DOB: [birth year], 77 y.o.   MRN: [id number]  HPI Reviewed and agree with Dr. Macky Lower management.    Review of Systems     Objective:   Physical Exam        Assessment & Plan:

## 2011-10-07 NOTE — Assessment & Plan Note (Signed)
Diabetes of long-standing duration currently under fair control of blood glucose based on   Lab Results  Component Value Date   HGBA1C 9.0 07/05/2011    ,home fasting and preprandial CBG readings of 150-250. Control is suboptimal due to diet and insulin dose. Denies hypoglycemic events.  Able to verbalize appropriate hypoglycemia management plan. Increased dose of rapid insulin Novolog (insulin aspart) to 3/5/3 units prior to breakfast, lunch, and dinner.  Pt's glipizide regimen was simplified from 2 tabs of 5mg  XL together daily to 1 tab 10mg  XL once daily.  Pt was counseled to check cBGs every morning and also prior to evening meal, with option to check at other times throughout the day.    Written patient instructions provided.  Follow up in  Pharmacist Clinic Visit 1 month.   TTFFC 40 minutes.  Patient seen with Adrian Blackwater, PharmD Candidate and Haynes Hoehn, Pharmacy Resident

## 2011-10-07 NOTE — Assessment & Plan Note (Signed)
  Pt's INR today is 2.0, within target range of 2.0-3.0.  Pt counseled to continue Coumadin as scheduled.  Pt mentioned endoscopy procedure 10/17 and has been told to stop his Coumadin 5 days prior to procedure.  Next INR visit will be in 2 weeks from home health agency.

## 2011-10-07 NOTE — Patient Instructions (Addendum)
Continue warfarin, same dose as previous. Next INR in 2 weeks. 5 days after restarting following procedure on 10/16/2011. Change Glipizide to 10mg . Check blood sugar 2 times a day. Once in the morning before breakfast and once 2 hours after any meal. Next visit 1 month Rx clinic.

## 2011-10-21 ENCOUNTER — Encounter (HOSPITAL_COMMUNITY): Payer: Medicare Other | Attending: Nephrology

## 2011-10-21 DIAGNOSIS — D509 Iron deficiency anemia, unspecified: Secondary | ICD-10-CM | POA: Insufficient documentation

## 2011-10-22 ENCOUNTER — Telehealth: Payer: Self-pay | Admitting: Family Medicine

## 2011-10-22 NOTE — Telephone Encounter (Signed)
Reports his INR- 2.4 & PT 28.7 - she will recheck in 2 weeks.  Also wants to see if he can d/c some of his vitamins.  He is taking a lot during the day and she is in question as to whether he needs that many.

## 2011-10-22 NOTE — Telephone Encounter (Signed)
Told them that could discontinue the folic acid otherwise all other vitamins are needed.  Philip Matthews will relay that message.

## 2011-10-28 ENCOUNTER — Encounter (HOSPITAL_COMMUNITY): Payer: Medicare Other

## 2011-11-05 ENCOUNTER — Other Ambulatory Visit: Payer: Self-pay | Admitting: Family Medicine

## 2011-11-05 ENCOUNTER — Telehealth: Payer: Self-pay | Admitting: Family Medicine

## 2011-11-05 NOTE — Telephone Encounter (Signed)
Reports INR is 2.0 Will refill meds box for 2 weeks unless she hears from Dr Katrinka Blazing

## 2011-11-05 NOTE — Telephone Encounter (Signed)
Refill request

## 2011-11-05 NOTE — Telephone Encounter (Signed)
Perfect no changes needed

## 2011-11-08 ENCOUNTER — Ambulatory Visit: Payer: Medicare Other | Admitting: Pharmacist

## 2011-11-18 ENCOUNTER — Encounter: Payer: Self-pay | Admitting: Pharmacist

## 2011-11-18 ENCOUNTER — Ambulatory Visit (INDEPENDENT_AMBULATORY_CARE_PROVIDER_SITE_OTHER): Payer: Medicare Other | Admitting: Pharmacist

## 2011-11-18 DIAGNOSIS — I10 Essential (primary) hypertension: Secondary | ICD-10-CM

## 2011-11-18 DIAGNOSIS — E119 Type 2 diabetes mellitus without complications: Secondary | ICD-10-CM

## 2011-11-18 NOTE — Assessment & Plan Note (Signed)
  High blood pressure was noted on vitals.  Upon further exploration we determined that it was likely due to clonidine patch error.  Previous patch had been on for 2 weeks (it had not been changed the previous week).  Asked patient to restart once weekly administration.

## 2011-11-18 NOTE — Assessment & Plan Note (Signed)
Diabetes of unknown yrs duration currently under good control of blood glucose based on. Lab Results  Component Value Date   HGBA1C 8.7 10/07/2011  ,home fasting CBG and random CBG readings in mid-high 100s. Denies hypoglycemic events.  Able to verbalize appropriate hypoglycemia management plan.  Written patient instructions provided.  Will follow-up with Dr. Terrilee Files in January.  If at goal of <8 continue current plan.  If greater than 8 schedule appointment with Pharmacy Clinic.      Total time in face to face counseling 20 minutes.  Patient seen with Marijo Conception, PharmD Candidate.

## 2011-11-18 NOTE — Progress Notes (Signed)
  Subjective:    Patient ID: Philip Matthews, male    DOB: Dec 28, 1928, 77 y.o.   MRN: [id number]  HPI  Reviewed and agree with Dr. Macky Lower management.      Review of Systems     Objective:   Physical Exam        Assessment & Plan:

## 2011-11-18 NOTE — Progress Notes (Signed)
  Subjective:    Patient ID: Philip Matthews, male    DOB: Dec 14, 1928, 77 y.o.   MRN: [id number]  HPI  Patient arrived in good spirits accompanied by his daughter (also hearing impaired) and sign-language interpreter Loraine Leriche).  Reason for visit is follow-up on general diabetes care.  Med review was performed and changes noted.  Denies hypoglycemia.  Brought log-book.  Range in readings from 87-205 with majority of readings in mid-high 100s.  Blood pressure was high at this visit but likely do to clonidine patch administration error (patient was wearing patch but it was dated 11/6).    Review of Systems     Objective:   Physical Exam Ht 5\' 4"  (1.626 m)  Wt 150 lb 3.2 oz (68.13 kg)  BMI 25.78 kg/m2     Assessment & Plan:  Diabetes of unknown yrs duration currently under good control of blood glucose based on. Lab Results  Component Value Date   HGBA1C 8.7 10/07/2011  ,home fasting CBG and random CBG readings in mid-high 100s. Denies hypoglycemic events.  Able to verbalize appropriate hypoglycemia management plan.  Written patient instructions provided.  Will follow-up with Dr. Terrilee Files in January.  If at goal of <8 continue current plan.  If greater than 8 schedule appointment with Pharmacy Clinic.     High blood pressure was noted on vitals.  Upon further exploration we determined that it was likely due to clonidine patch error.  Previous patch had been on for 2 weeks (it had not been changed the previous week).  Asked patient to restart once weekly administration.    Total time in face to face counseling 20 minutes.  Patient seen with Marijo Conception, PharmD Candidate.

## 2011-11-18 NOTE — Patient Instructions (Signed)
Continue current plan.  No Changes Have a great holiday! Check with Dr. Katrinka Blazing or Tresa Endo (home health nurse) about Zostavax (Shingles vaccine)

## 2011-11-19 ENCOUNTER — Other Ambulatory Visit: Payer: Self-pay | Admitting: Family Medicine

## 2011-11-19 ENCOUNTER — Telehealth: Payer: Self-pay | Admitting: Family Medicine

## 2011-11-19 NOTE — Telephone Encounter (Signed)
Refill request

## 2011-11-19 NOTE — Telephone Encounter (Signed)
Reporting INR- 2.1, PT 25.8 Let her know if any changes

## 2011-12-03 ENCOUNTER — Other Ambulatory Visit: Payer: Self-pay | Admitting: Family Medicine

## 2011-12-03 ENCOUNTER — Telehealth: Payer: Self-pay | Admitting: Family Medicine

## 2011-12-03 NOTE — Telephone Encounter (Signed)
Philip Matthews's INR was 1.9 and his protime was 23.1. Have been eating more vegetables so that may be why his inr was lower.  Tresa Endo want to know if she should make any med changes & have him rechecked 2 wks from now.  Please call her back at [phone] to give verbal order.

## 2011-12-03 NOTE — Telephone Encounter (Signed)
Called Kelly back and told her to not make any changes.

## 2011-12-03 NOTE — Telephone Encounter (Signed)
Refill request

## 2011-12-17 ENCOUNTER — Other Ambulatory Visit: Payer: Self-pay | Admitting: Cardiovascular Disease

## 2011-12-17 ENCOUNTER — Other Ambulatory Visit: Payer: Self-pay | Admitting: Family Medicine

## 2011-12-17 ENCOUNTER — Telehealth: Payer: Self-pay | Admitting: Family Medicine

## 2011-12-17 NOTE — Telephone Encounter (Signed)
Called told him that he can take azithromycin starting Thursday if not better. No changes in INR Coumadin.

## 2011-12-17 NOTE — Telephone Encounter (Signed)
Reports INR- 2.2 & PT - 26.5 - will not make any changes at this time.  Also wanted to report that he has a little congestion and cough (little bit of yellowish congestion) Lungs sound OK. Wants to know when and if they should start Z-Pak.  He has orders for it, but wasn't sure when to start it.

## 2011-12-17 NOTE — Telephone Encounter (Signed)
Refill request

## 2012-01-14 ENCOUNTER — Telehealth: Payer: Self-pay | Admitting: Family Medicine

## 2012-01-14 ENCOUNTER — Ambulatory Visit (INDEPENDENT_AMBULATORY_CARE_PROVIDER_SITE_OTHER): Payer: Medicare Other | Admitting: Family Medicine

## 2012-01-14 ENCOUNTER — Encounter: Payer: Self-pay | Admitting: Family Medicine

## 2012-01-14 ENCOUNTER — Other Ambulatory Visit: Payer: Self-pay | Admitting: Family Medicine

## 2012-01-14 VITALS — BP 133/67 | HR 70 | Temp 97.1°F | Ht 64.0 in | Wt 153.0 lb

## 2012-01-14 DIAGNOSIS — R21 Rash and other nonspecific skin eruption: Secondary | ICD-10-CM

## 2012-01-14 DIAGNOSIS — J984 Other disorders of lung: Secondary | ICD-10-CM

## 2012-01-14 DIAGNOSIS — E119 Type 2 diabetes mellitus without complications: Secondary | ICD-10-CM | POA: Diagnosis not present

## 2012-01-14 DIAGNOSIS — I10 Essential (primary) hypertension: Secondary | ICD-10-CM | POA: Diagnosis not present

## 2012-01-14 DIAGNOSIS — D649 Anemia, unspecified: Secondary | ICD-10-CM | POA: Diagnosis not present

## 2012-01-14 LAB — POCT GLYCOSYLATED HEMOGLOBIN (HGB A1C): Hemoglobin A1C: 6.5

## 2012-01-14 MED ORDER — HYDROCORTISONE 1 % EX LOTN: TOPICAL_LOTION | Freq: Two times a day (BID) | CUTANEOUS | Status: DC

## 2012-01-14 MED ORDER — PREDNISONE 20 MG PO TABS: 40.0000 mg | ORAL_TABLET | Freq: Every day | ORAL | Status: DC

## 2012-01-14 NOTE — Assessment & Plan Note (Signed)
Discussed with patient again at this time as he feels like a repeat x-ray the patient declined.

## 2012-01-14 NOTE — Assessment & Plan Note (Signed)
Likely secondary to Procrit. Does seem to be more of a drug reaction. We will get liver enzymes measures the patient's bilirubin the patient does not have any sclerae icterus medically to concern. This is in sun exposed areas. Discussed likelihood of using hydrocortisone as well as lotion and then sunscreen when patient is out. If not better in 3 days time patient has been given a prescription for prednisone to take in case. Told patient there is a potential that he could pick Benadryl Coumadin he be careful do to the anticholinergic effects. Patient to return in one week if not better.

## 2012-01-14 NOTE — Assessment & Plan Note (Signed)
Patient is doing fantastic on insulin therapy. We'll make no changes continue doing what he is doing. Encourage patient to continue current diet and increase some exercise.

## 2012-01-14 NOTE — Telephone Encounter (Signed)
Tresa Endo reports INR today 3.3.   She states on New Years Day  she reported to Dr. Gwendolyn Grant that INR was 3.1. At that time thought might be due to Augmentin he was on and held one dose of coumadin  and then resumed 5 mg daily . Now @ weeks later INR is 3.3.  She did not put a coumadin in pill box for today because she thought Dr. Katrinka Blazing might want to hold today. Please let her know for sure.  Also patient is coming in today at 11:00 . He has a rash on upper extremities which itches. His face is burgandy in color and puffy.  She called Dr. Audrie Gallus  and he advised to hold Procrit which is due today until Dr. Katrinka Blazing sees him to determine if he thinks the rash is due to Procrit.  If not please let Tresa Endo know so she can go back out this week to give it. Also write down coumadin instructions for daughter.   She will send over by fax today his HGB results from today also.

## 2012-01-14 NOTE — Telephone Encounter (Signed)
Refill request

## 2012-01-14 NOTE — Telephone Encounter (Signed)
Discussed with Tresa Endo at this time told to hold Coumadin for the next 2 days and then go back on his regular regimen of 5 mg daily recheck in 2 weeks. Told her I will assess the rash and consider the Procrit as a potential cause.

## 2012-01-14 NOTE — Patient Instructions (Signed)
Your A1c is 6.5 which is fantastic! For your  rash I when she to use hydrocortisone cream 2 times a day I will give you a prescription for prednisone to have on hand in case the rash gets much worse or if the itching is not controlled. You can use Benadryl one time a day if really needed but I would encourage you to not use it if possible. This has a lot of interactions with her other medications. Your blood pressure is perfect. I think at this time it would be do to hold your Procrit We should get labs checking your liver function today and we will just check this every year. I should see you again in 4-6 months.

## 2012-01-14 NOTE — Progress Notes (Signed)
  Subjective:    Patient ID: Philip Matthews, male    DOB: 1928-11-16, 78 y.o.   MRN: [id number]  Diabetes  Rash  Foot Pain Associated symptoms include a rash.   1. Hypertension Blood pressure at home:120's-130's Blood pressure today: 130/70 Taking Meds:yes including clonidine, lasix 20, norvasc 10, toprol 100,  Side effects:no ROS: Denies headache visual changes nausea, vomiting, chest pain or abdominal pain or shortness of breath. No dizziness  2. Diabetes:  High at home:110-120's Low at home:96 Taking medications:yes started on insulin  Lab Results  Component Value Date   HGBA1C 6.5 01/14/2012   Side effects:no ROS: denies fever, chills, dizziness, loss of conscieness, polyuria poly dipsia numbness or tingling in extremities or chest pain.   CKD-  Pt is following up with renal has been well controlled, did have problems with high potassium but the kayexylate has helped and is under great control.  Will see Dr. Allena Katz again in January.  Lab Results  Component Value Date   CREATININE 1.76* 03/27/2011    Rash-patient has had this rash on his arms neck and face are now 3 days. This came out of the blue. The only thing that is new is that patient was started on Procrit per his nephrologist. Patient has an appointment with his nephrologist next week. Patient was put on Procrit again today but I did tell him to hold it. Patient has had new laundry detergent but this is on exposed skin only. Patient states that it does itch a lot but denies fevers chills or any discharge.   Review of Systems  Skin: Positive for rash.   See above.    Past medical history, social, surgical and family history all reviewed.   Objective:   Physical Exam     General:  Well-developed,well-nourished,in no acute distress; alert,appropriate and cooperative throughout examination Eyes:  PERRLA, EOMIl no scleral icterus Mouth:  MMM Lungs:  Normal respiratory effort, chest expands symmetrically. Lungs  are clear to auscultation, no crackles or wheezes. Heart:  RRR 2/6 SEM stable Abdomen:  BS +, NT, ND Pulses:  2+ Extremities: Neurovascularly intact trace edema at ankles Skin: Patient has a macular erythematous rash from elbows distally that shows exfoliation due to patient itching in patient's cheeks are significantly red with a rash as well.  Assessment & Plan:

## 2012-01-14 NOTE — Telephone Encounter (Signed)
Asking to speak with RN re: pts INR results and also a rash he has, pt was due for an injection today but is holding off until pt is seen for the rash, pt is scheduled at 11 today with Dr. Katrinka Blazing but Tresa Endo would like to give RN some info.

## 2012-01-14 NOTE — Assessment & Plan Note (Signed)
Patient is at goal no need to make any changes at this time.

## 2012-01-15 ENCOUNTER — Encounter: Payer: Self-pay | Admitting: *Deleted

## 2012-01-15 LAB — COMPREHENSIVE METABOLIC PANEL
Alkaline Phosphatase: 85 U/L (ref 39–117)
Creat: 1.58 mg/dL — ABNORMAL HIGH (ref 0.50–1.35)
Glucose, Bld: 194 mg/dL — ABNORMAL HIGH (ref 70–99)
Sodium: 137 mEq/L (ref 135–145)
Total Bilirubin: 0.2 mg/dL — ABNORMAL LOW (ref 0.3–1.2)
Total Protein: 6.4 g/dL (ref 6.0–8.3)

## 2012-01-15 LAB — URIC ACID: Uric Acid, Serum: 5.3 mg/dL (ref 4.0–7.8)

## 2012-01-22 ENCOUNTER — Encounter: Payer: Self-pay | Admitting: Cardiovascular Disease

## 2012-01-22 ENCOUNTER — Ambulatory Visit (INDEPENDENT_AMBULATORY_CARE_PROVIDER_SITE_OTHER): Payer: Medicare Other | Admitting: Cardiovascular Disease

## 2012-01-22 DIAGNOSIS — E119 Type 2 diabetes mellitus without complications: Secondary | ICD-10-CM

## 2012-01-22 DIAGNOSIS — I10 Essential (primary) hypertension: Secondary | ICD-10-CM | POA: Diagnosis not present

## 2012-01-22 DIAGNOSIS — E782 Mixed hyperlipidemia: Secondary | ICD-10-CM | POA: Diagnosis not present

## 2012-01-22 DIAGNOSIS — I81 Portal vein thrombosis: Secondary | ICD-10-CM

## 2012-01-22 NOTE — Assessment & Plan Note (Signed)
?   Indication for coumadin.  HHN checks at home.  Plan per primary and renal

## 2012-01-22 NOTE — Assessment & Plan Note (Signed)
Discussed low carb diet.  Target hemoglobin A1c is 6.5 or less.  Continue current medications.  

## 2012-01-22 NOTE — Assessment & Plan Note (Signed)
F/U Dr Allena Katz.  Low protein low P diet

## 2012-01-22 NOTE — Progress Notes (Signed)
Last seen 2011 for surgical clearance for rotator cuff with normal dobutamine echo  He is deaf and an interpreter was used througout the interview.He has no documented CAD. He has not had any recent surgery. He is on coumadin but the indication is not clear. 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 He has type 2 DM, hypertensio and elevated lipids. A lot of his medical care had been done in [place] before and this is where the coumadin was started. There is a ? of connective tissue disease or lupus. He walks but is otherwise sedentary. He denies SSCP, palpitations, dyspnea, edema or syncope.  ROS: Denies fever, malais, weight loss, blurry vision, decreased visual acuity, cough, sputum, SOB, hemoptysis, pleuritic pain, palpitaitons, heartburn, abdominal pain, melena, lower extremity edema, claudication, or rash.  All other systems reviewed and negative  General: Affect appropriate Deaf Healthy:  appears stated age HEENT: normal Neck supple with no adenopathy JVP normal no bruits no thyromegaly Lungs clear with no wheezing and good diaphragmatic motion Heart:  S1/S2 no murmur,rub, gallop or click PMI normal Abdomen: benighn, BS positve, no tenderness, no AAA no bruit.  No HSM or HJR Distal pulses intact with no bruits No edema Neuro non-focal Skin warm and dry Unable to lift either arm over head   Current Outpatient Prescriptions  Medication Sig Dispense Refill  . amLODipine (NORVASC) 10 MG tablet take 1 tablet by mouth once daily  90 tablet  3  . aspirin 81 MG EC tablet Take 81 mg by mouth daily.        . Blood Glucose Monitoring Suppl (PRODIGY POCKET BLOOD GLUCOSE) W/DEVICE KIT as directed.        . calcitRIOL (ROCALTROL) 0.25 MCG capsule Take 0.25 mcg by mouth Every Monday, Wednesday, and Friday.        . Calcium (CALCIUM 500/D) 500-125 MG-UNIT TABS Take 1 tablet by mouth 2 (two) times daily.        . cloNIDine (CATAPRES - DOSED IN MG/24 HR) 0.2 mg/24hr patch apply 1 patch ON SKIN AND CHANGE  WEEKLY  16 patch  1  . docusate sodium (COLACE) 100 MG capsule Take 100 mg by mouth 2 (two) times daily.        . Easy Touch Lancets MISC Please use One touch ultra lancets please.  Not in computer to use.  Thank you. Use as directed.  100 each  6  . epoetin alfa (PROCRIT) 69629 UNIT/ML injection Inject 1 mL (10,000 Units total) into the skin 3 (three) times a week.  1 mL    . Febuxostat (ULORIC) 80 MG TABS Take one tablet by mouth every other day.      . ferrous sulfate (FERRO-BOB) 325 (65 FE) MG tablet Take 325 mg by mouth daily.        . furosemide (LASIX) 40 MG tablet take 1/2 tablet by mouth once daily  45 tablet  1  . glipiZIDE (GLIPIZIDE XL) 10 MG 24 hr tablet Take 1 tablet (10 mg total) by mouth daily.  30 tablet  11  . glucose blood test strip Please give for One touch ultra strips.  Not in computer for selection Use as instructed  100 each  12  . hydrALAZINE (APRESOLINE) 25 MG tablet Take 1 tablet (25 mg total) by mouth 3 (three) times daily.      . hydrocortisone 1 % lotion Apply topically 2 (two) times daily.  99 mL  2  . insulin aspart (NOVOLOG FLEXPEN) 100 UNIT/ML  injection Take 3 units prior to Breakfast, 5 units prior to Lunch, and 3 units prior to Evening Meal.  10 mL  12  . Insulin Pen Needle 32G X 6 MM MISC 1 Stick by Does not apply route 3 (three) times daily as needed.  100 each  6  . KIONEX 15 GM/60ML suspension TAKE 60 MLS BY MOUTH EVERY TUESDAY AND FRIDAY  473 mL  1  . metoprolol (TOPROL-XL) 100 MG 24 hr tablet take 1 tablet by mouth every morning  30 tablet  3  . Multiple Vitamin (MULTIVITAMIN) tablet Take 1 tablet by mouth daily.        Marland Kitchen NEEDLE, DISP, 25 G (BD DISP NEEDLES) 25G X 5/8" MISC as directed.        Marland Kitchen omeprazole (PRILOSEC) 20 MG capsule Take 20 mg by mouth daily.        . polyethylene glycol powder (GLYCOLAX/MIRALAX) powder MIX 1 CAPFUL AND DISSOLVED IN WATER 2 TIMES A DAY FOR CONSTIPATION  527 g  6  . pravastatin (PRAVACHOL) 40 MG tablet take 1 tablet by mouth  once daily  30 tablet  6  . sodium bicarbonate 650 MG tablet take 1 tablet by mouth twice a day  64 tablet  10  . vitamin B-12 (CYANOCOBALAMIN) 100 MCG tablet Take 100 mcg by mouth daily.        Marland Kitchen warfarin (COUMADIN) 5 MG tablet TAKE 1 AND 1/2 TABLETS ON MONDAY, WEDNESDAY, FRIDAY,SATURDAY AND SUNDAYS. 1 TABLET ON TUESDAYS AND THURSDAY EVENINGS TO PREVENT BLOOD CLOTS  56 tablet  6  . zoster vaccine live, PF, (ZOSTAVAX) 09811 UNT/0.65ML injection Inject 19,400 Units into the skin once.  1 vial  6    Allergies  Review of patient's allergies indicates no known allergies.  Electrocardiogram:  SR rate 59  PR 212 otherwise normal ECG  Assessment and Plan

## 2012-01-22 NOTE — Assessment & Plan Note (Signed)
Cholesterol is at goal.  Continue current dose of statin and diet Rx.  No myalgias or side effects.  F/U  LFT's in 6 months. No results found for this basename: LDLCALC             

## 2012-01-22 NOTE — Patient Instructions (Signed)
Your physician wants you to follow-up in: YEAR WITH DR Eden Emms will receive a reminder letter in the mail two months in advance. If you don't receive a letter, please call our office to schedule the follow-up appointment. Your physician recommends that you continue on your current medications as directed. Please refer to the Current Medication list given to you today.

## 2012-01-22 NOTE — Assessment & Plan Note (Signed)
Well controlled.  Continue current medications and low sodium Dash type diet.    

## 2012-01-28 ENCOUNTER — Telehealth: Payer: Self-pay | Admitting: Family Medicine

## 2012-01-28 NOTE — Telephone Encounter (Signed)
Will forward to PCP 

## 2012-01-28 NOTE — Telephone Encounter (Signed)
Nurse from [facility] has blood results for Mr. Philip Matthews.  INR is 2.3 and Protime is 21.7.  Dr. Dawna Part has held his Procrit until 2/11 labs are drawn and the rash is completely resolved.  If it is ok, she will draw INR on 2/11 which is the end of the certification period, so she will see on 2/11 instead of the 12th.

## 2012-01-28 NOTE — Telephone Encounter (Signed)
That would be perfect 

## 2012-02-04 ENCOUNTER — Telehealth: Payer: Self-pay | Admitting: *Deleted

## 2012-02-04 DIAGNOSIS — H4010X0 Unspecified open-angle glaucoma, stage unspecified: Secondary | ICD-10-CM | POA: Diagnosis not present

## 2012-02-04 DIAGNOSIS — H52209 Unspecified astigmatism, unspecified eye: Secondary | ICD-10-CM | POA: Diagnosis not present

## 2012-02-04 NOTE — Telephone Encounter (Signed)
Received call on our office voicemail from [facility].  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  Info given.  They will send office notes to our office.  Gaylene Brooks, RN

## 2012-02-10 ENCOUNTER — Telehealth: Payer: Self-pay | Admitting: Family Medicine

## 2012-02-10 NOTE — Telephone Encounter (Signed)
Called back and said no change.

## 2012-02-10 NOTE — Telephone Encounter (Signed)
PT- 1.7 INR - 20.7  pls let her know if she needs to change his meds.

## 2012-02-10 NOTE — Telephone Encounter (Signed)
Called back and gave verbal order.

## 2012-02-10 NOTE — Telephone Encounter (Signed)
Need verbal order to continue services and will call back with INR report

## 2012-02-11 ENCOUNTER — Encounter: Payer: Self-pay | Admitting: Family Medicine

## 2012-02-25 ENCOUNTER — Telehealth: Payer: Self-pay | Admitting: *Deleted

## 2012-02-25 DIAGNOSIS — N183 Chronic kidney disease, stage 3 unspecified: Secondary | ICD-10-CM | POA: Diagnosis not present

## 2012-02-25 DIAGNOSIS — D649 Anemia, unspecified: Secondary | ICD-10-CM | POA: Diagnosis not present

## 2012-02-25 NOTE — Telephone Encounter (Signed)
INR: 1.7 PT:20.9  Spoke with MD, he advises no change.  [name] informed. Philip Matthews, [name]

## 2012-03-02 DIAGNOSIS — E875 Hyperkalemia: Secondary | ICD-10-CM | POA: Diagnosis not present

## 2012-03-05 DIAGNOSIS — H409 Unspecified glaucoma: Secondary | ICD-10-CM | POA: Diagnosis not present

## 2012-03-05 DIAGNOSIS — H4010X0 Unspecified open-angle glaucoma, stage unspecified: Secondary | ICD-10-CM | POA: Diagnosis not present

## 2012-03-09 DIAGNOSIS — I129 Hypertensive chronic kidney disease with stage 1 through stage 4 chronic kidney disease, or unspecified chronic kidney disease: Secondary | ICD-10-CM | POA: Diagnosis not present

## 2012-03-09 DIAGNOSIS — N2581 Secondary hyperparathyroidism of renal origin: Secondary | ICD-10-CM | POA: Diagnosis not present

## 2012-03-09 DIAGNOSIS — D649 Anemia, unspecified: Secondary | ICD-10-CM | POA: Diagnosis not present

## 2012-03-24 ENCOUNTER — Telehealth: Payer: Self-pay | Admitting: *Deleted

## 2012-03-24 ENCOUNTER — Other Ambulatory Visit: Payer: Self-pay | Admitting: Family Medicine

## 2012-03-24 DIAGNOSIS — D649 Anemia, unspecified: Secondary | ICD-10-CM | POA: Diagnosis not present

## 2012-03-24 NOTE — Telephone Encounter (Signed)
Received call from [facility] RN with [facility] reporting PT/INR from today. PT 26.3 , INR 2.2 . Currently on  Coumadin 5 mg daily.  Paged Dr. Katrinka Blazing and he advises to continue current regime Coumdain 5 mg daily. Recheck in 2 weeks.  Per Dr. Katrinka Blazing if  INR is between 2 and 3 does not need to call report.  Kristy notified.

## 2012-04-07 ENCOUNTER — Telehealth: Payer: Self-pay | Admitting: Family Medicine

## 2012-04-07 ENCOUNTER — Other Ambulatory Visit: Payer: Self-pay | Admitting: Family Medicine

## 2012-04-07 NOTE — Telephone Encounter (Signed)
Pt informed. Philip Matthews  

## 2012-04-07 NOTE — Telephone Encounter (Signed)
Please continue continued care. Verbal order now given.

## 2012-04-07 NOTE — Telephone Encounter (Signed)
Needs verbal order to continue care - time to be recertified

## 2012-05-05 ENCOUNTER — Other Ambulatory Visit: Payer: Self-pay | Admitting: Family Medicine

## 2012-05-18 ENCOUNTER — Other Ambulatory Visit: Payer: Self-pay | Admitting: Family Medicine

## 2012-05-27 DIAGNOSIS — E1149 Type 2 diabetes mellitus with other diabetic neurological complication: Secondary | ICD-10-CM | POA: Diagnosis not present

## 2012-05-27 DIAGNOSIS — Q828 Other specified congenital malformations of skin: Secondary | ICD-10-CM | POA: Diagnosis not present

## 2012-05-27 DIAGNOSIS — L608 Other nail disorders: Secondary | ICD-10-CM | POA: Diagnosis not present

## 2012-05-29 ENCOUNTER — Ambulatory Visit (INDEPENDENT_AMBULATORY_CARE_PROVIDER_SITE_OTHER): Payer: Medicare Other | Admitting: Family Medicine

## 2012-05-29 ENCOUNTER — Encounter: Payer: Self-pay | Admitting: Family Medicine

## 2012-05-29 VITALS — BP 158/72 | HR 62 | Temp 98.6°F | Ht 64.0 in | Wt 158.0 lb

## 2012-05-29 DIAGNOSIS — R5383 Other fatigue: Secondary | ICD-10-CM

## 2012-05-29 DIAGNOSIS — I81 Portal vein thrombosis: Secondary | ICD-10-CM

## 2012-05-29 DIAGNOSIS — R5381 Other malaise: Secondary | ICD-10-CM | POA: Diagnosis not present

## 2012-05-29 DIAGNOSIS — M25469 Effusion, unspecified knee: Secondary | ICD-10-CM | POA: Diagnosis not present

## 2012-05-29 DIAGNOSIS — I10 Essential (primary) hypertension: Secondary | ICD-10-CM

## 2012-05-29 DIAGNOSIS — E538 Deficiency of other specified B group vitamins: Secondary | ICD-10-CM

## 2012-05-29 DIAGNOSIS — R0600 Dyspnea, unspecified: Secondary | ICD-10-CM

## 2012-05-29 DIAGNOSIS — E119 Type 2 diabetes mellitus without complications: Secondary | ICD-10-CM | POA: Diagnosis not present

## 2012-05-29 DIAGNOSIS — D376 Neoplasm of uncertain behavior of liver, gallbladder and bile ducts: Secondary | ICD-10-CM | POA: Diagnosis not present

## 2012-05-29 DIAGNOSIS — R609 Edema, unspecified: Secondary | ICD-10-CM

## 2012-05-29 DIAGNOSIS — R600 Localized edema: Secondary | ICD-10-CM

## 2012-05-29 DIAGNOSIS — R0609 Other forms of dyspnea: Secondary | ICD-10-CM

## 2012-05-29 LAB — POCT GLYCOSYLATED HEMOGLOBIN (HGB A1C): Hemoglobin A1C: 6.5

## 2012-05-29 MED ORDER — MEDICAL COMPRESSION THIGH HIGH MISC: 1.0000 | Freq: Every day | Status: DC

## 2012-05-29 MED ORDER — AMLODIPINE BESYLATE 10 MG PO TABS: 5.0000 mg | ORAL_TABLET | Freq: Every day | ORAL | Status: DC

## 2012-05-29 NOTE — Patient Instructions (Signed)
It is good to see you. If you are in the neighborhood I would like you to come back and have your labs drawn. Sorry that are to close the day. Cut your amlodipine and. This would be taking 5 mg daily. I am giving you a prescription for compression hose. I would wear these daily. It has been my pleasure on getting to know you.

## 2012-05-30 DIAGNOSIS — R600 Localized edema: Secondary | ICD-10-CM | POA: Insufficient documentation

## 2012-05-30 NOTE — Progress Notes (Signed)
Patient ID: Philip Matthews, male   DOB: Oct 08, 1928, 78 y.o.   MRN: [id number]  Subjective:    Patient ID: Philip Matthews, male    DOB: December 05, 1928, 78 y.o.   MRN: [id number]  Diabetes  Rash  Foot Pain   1. Hypertension Blood pressure at home:130-140 Blood pressure today: 130/72 on recheck Taking Meds:yes including clonidine, lasix 20, norvasc 10, toprol 100,  Side effects:no ROS: Denies headache visual changes nausea, vomiting, chest pain or abdominal pain or shortness of breath. No dizziness  2. Diabetes:  High at home:110-120's Low at home:85 Taking medications:yes started on insulin  Lab Results  Component Value Date   HGBA1C 6.5 05/29/2012   Side effects:no ROS: denies fever, chills, dizziness, loss of conscieness, polyuria poly dipsia numbness or tingling in extremities or chest pain.   CKD-  Pt is following up with renal has been well controlled, did have problems with high potassium but the kayexylate has helped and is under great control.  Will see Dr. Allena Katz again in July Lab Results  Component Value Date   CREATININE 1.58* 01/14/2012   Lower extremity edema-patient has had some leg pain and swelling bilaterally for the last week. Patient states that the pain has seemed to increase. Patient has not changed any of these medicines but has been sitting a little more frequently than usual. Patient denies any increasing fatigue, shortness of breath, chest pain or cough. Patient has not had any recent illness and denies any fever or chills. Patient states he is still able to walk but has this chronic pain due to the swelling.   Review of Systems See above.    Past medical history, social, surgical and family history all reviewed.   Objective:   Physical Exam Vitals reviewed.    General:  Well-developed,well-nourished,in no acute distress; alert,appropriate and cooperative throughout examination Eyes:  PERRLA, EOMIl no scleral icterus Mouth:  MMM Lungs:  Normal  respiratory effort, chest expands symmetrically. Lungs are clear to auscultation, no crackles or wheezes. Heart:  RRR 2/6 SEM stable Abdomen:  BS +, NT, ND Pulses:  2+ Extremities: Neurovascularly intact trace edema at ankles Skin: Patient has a macular erythematous rash from elbows distally that shows exfoliation due to patient itching in patient's cheeks are significantly red with a rash as well.  Assessment & Plan:

## 2012-05-30 NOTE — Assessment & Plan Note (Signed)
Want to continue to monitor closely we will decrease patient's amlodipine to 5 mg daily. Patient may increase but I would try to keep goal systolic blood pressure less than 150s systolically if possible. That said I think it's more of a risk of fall so would not want to make patient orthostatics.

## 2012-05-30 NOTE — Assessment & Plan Note (Signed)
Patient is extremely well-controlled now with NovoLog glipizide. Continue current therapy A1c has been constantly under 6.5 now for 9 months.

## 2012-05-30 NOTE — Assessment & Plan Note (Signed)
Patient does not give me any history that would make the think this is cardiac in nature. Patient denies cough denies any chest pain denies any worsening fatigue or shortness of breath. At the top of the differential has to be dependent edema. We'll try to treat with elevation and compression hose. Outpatient medications amlodipine may be contributing to decreased. Patient will followup in 3 weeks for reevaluation. We are also getting labs and will review once drawn. This includes CBC, BNP and metabolic panel.

## 2012-06-01 ENCOUNTER — Other Ambulatory Visit: Payer: Medicare Other

## 2012-06-01 ENCOUNTER — Telehealth: Payer: Self-pay | Admitting: Family Medicine

## 2012-06-01 DIAGNOSIS — E538 Deficiency of other specified B group vitamins: Secondary | ICD-10-CM

## 2012-06-01 DIAGNOSIS — R5381 Other malaise: Secondary | ICD-10-CM | POA: Diagnosis not present

## 2012-06-01 DIAGNOSIS — R5383 Other fatigue: Secondary | ICD-10-CM | POA: Diagnosis not present

## 2012-06-01 DIAGNOSIS — E119 Type 2 diabetes mellitus without complications: Secondary | ICD-10-CM

## 2012-06-01 LAB — CBC WITH DIFFERENTIAL/PLATELET
Basophils Absolute: 0 10*3/uL (ref 0.0–0.1)
Basophils Relative: 0 % (ref 0–1)
Eosinophils Absolute: 0.5 10*3/uL (ref 0.0–0.7)
Eosinophils Relative: 5 % (ref 0–5)
MCH: 26.1 pg (ref 26.0–34.0)
MCV: 84.4 fL (ref 78.0–100.0)
Platelets: 221 10*3/uL (ref 150–400)
RDW: 15.5 % (ref 11.5–15.5)
WBC: 8.4 10*3/uL (ref 4.0–10.5)

## 2012-06-01 NOTE — Progress Notes (Signed)
CMP,CBC WITH DIFF,TSH AND B12 DONE TODAY Philip Matthews

## 2012-06-01 NOTE — Telephone Encounter (Signed)
Did not specify strenghth of compression hoses that patient was prescribed.  Please call pharmacy asap to confer.  Need to know if 15/20 strenghth or 20/30 or 30/40

## 2012-06-01 NOTE — Telephone Encounter (Signed)
Called back and told him to try 20/30

## 2012-06-02 LAB — COMPREHENSIVE METABOLIC PANEL
AST: 26 U/L (ref 0–37)
Albumin: 3.9 g/dL (ref 3.5–5.2)
Alkaline Phosphatase: 104 U/L (ref 39–117)
Glucose, Bld: 47 mg/dL — ABNORMAL LOW (ref 70–99)
Potassium: 5.1 mEq/L (ref 3.5–5.3)
Sodium: 140 mEq/L (ref 135–145)
Total Protein: 7 g/dL (ref 6.0–8.3)

## 2012-06-16 ENCOUNTER — Telehealth: Payer: Self-pay | Admitting: Family Medicine

## 2012-06-16 DIAGNOSIS — M109 Gout, unspecified: Secondary | ICD-10-CM

## 2012-06-16 MED ORDER — FEBUXOSTAT 80 MG PO TABS: ORAL_TABLET | ORAL | Status: DC

## 2012-06-16 NOTE — Telephone Encounter (Signed)
Paged Dr. Katrinka Blazing and he advises OK to send in refill for 3 months supply. Attempted call back to daughter and thru video  relay got no answer. Will try again later.

## 2012-06-16 NOTE — Telephone Encounter (Signed)
Daughter have been trying to obtain refill for Philip Matthews's Ulloric.  Contacted pharmacy several times and they said they sent request.  Haven't heard anything yet.  Please send rx refill to pharmacy as Philip Matthews is out completely and it is his gout med.  Let daughter know when rx has been sent.

## 2012-06-16 NOTE — Telephone Encounter (Signed)
Message left on voicemail that RX has been sent in. 

## 2012-06-17 ENCOUNTER — Encounter: Payer: Self-pay | Admitting: Family Medicine

## 2012-06-17 ENCOUNTER — Ambulatory Visit (INDEPENDENT_AMBULATORY_CARE_PROVIDER_SITE_OTHER): Payer: Medicare Other | Admitting: Family Medicine

## 2012-06-17 VITALS — BP 138/69 | HR 69 | Temp 97.7°F | Ht 64.0 in | Wt 154.9 lb

## 2012-06-17 DIAGNOSIS — R609 Edema, unspecified: Secondary | ICD-10-CM

## 2012-06-17 DIAGNOSIS — I10 Essential (primary) hypertension: Secondary | ICD-10-CM | POA: Diagnosis not present

## 2012-06-17 DIAGNOSIS — R600 Localized edema: Secondary | ICD-10-CM

## 2012-06-17 DIAGNOSIS — E039 Hypothyroidism, unspecified: Secondary | ICD-10-CM

## 2012-06-17 DIAGNOSIS — L600 Ingrowing nail: Secondary | ICD-10-CM | POA: Diagnosis not present

## 2012-06-17 MED ORDER — MEDICAL COMPRESSION THIGH HIGH MISC: 1.0000 | Freq: Every day | Status: DC

## 2012-06-17 MED ORDER — CEPHALEXIN 500 MG PO CAPS: 500.0000 mg | ORAL_CAPSULE | Freq: Two times a day (BID) | ORAL | Status: AC

## 2012-06-17 MED ORDER — LEVOTHYROXINE SODIUM 25 MCG PO TABS: 25.0000 ug | ORAL_TABLET | Freq: Every day | ORAL | Status: DC

## 2012-06-17 NOTE — Assessment & Plan Note (Signed)
Patient appears to be act: We'll make no changes continue current therapy. No signs of orthostatic hypotension.

## 2012-06-17 NOTE — Patient Instructions (Signed)
It is good to see you. Please change the dressing on the toe daily for the next 4 days. I'm giving you a new medicine called levothyroxine. This is for his thyroid. Take one pill daily in the morning. I am also giving you an antibiotic called Keflex. Take one pill twice a day for the next 5 days to make sure that toe does not become infected. This may cause changes in his Coumadin levels suggests be ready if there have normal. It has been a privilege getting to know both of you. I wish you both.

## 2012-06-17 NOTE — Assessment & Plan Note (Signed)
Patient rewritten for compression stockings seemed to be mildly improved from last visit with a decrease in amlodipine as well. We'll followup with new primary care physician in the future. Warned of red flags and when to seek medical attention.

## 2012-06-17 NOTE — Assessment & Plan Note (Signed)
Fix today warned of potential infection so given Keflex secondary to patient being diabetic. Discussed proper toenail cutting followup only if seems to be infected.

## 2012-06-17 NOTE — Progress Notes (Signed)
Patient ID: PASCAL STIGGERS, male   DOB: December 12, 1928, 78 y.o.   MRN: [id number] Patient ID: KAHIL AGNER, male   DOB: [birth year], 78 y.o.   MRN: [id number]  Subjective:    Patient ID: JAYSUN WESSELS, male    DOB: [birth year], 78 y.o.   MRN: [id number]  Diabetes  Rash  Foot Pain   1. Hypertension Blood pressure at home:130-140 Blood pressure today: 138/69 Taking Meds:yes including clonidine, lasix 20, toprol 100, but decreased Norvasc to 5 mg after last visit due to potential swelling in lower extremities. Side effects:no ROS: Denies headache visual changes nausea, vomiting, chest pain or abdominal pain or shortness of breath. No dizziness   CKD-  Pt is following up with renal has been well controlled, did have problems with high potassium but the kayexylate has helped and is under great control.  Will see Dr. Allena Katz again in July Lab Results  Component Value Date   CREATININE 1.60* 06/01/2012   check hemoglobin which is low as well at 9.2 baseline is 10-11.   Lower extremity edema-patient has had some leg pain and swelling bilaterally. Patient was given compression stockings as well as decrease his amlodipine to 5 mg daily. Patient was unable to get compression stockings and doesn't think that it has improved tremendously but maybe a little bit. Patient denies any increasing fatigue, shortness of breath, chest pain or cough. Patient has not had any recent illness and denies any fever or chills. Patient states he is still able to walk but has this chronic pain due to the swelling.  Patient also has ingrown toenail on the left large toe very tender no fevers no chills no discharge. States that it's ingrown on both the medial and lateral aspects. Review of Systems   See above.    Past medical history, social, surgical and family history all reviewed.   Objective:   Physical Exam Vitals reviewed.    General:  Well-developed,well-nourished,in no acute distress; alert,appropriate and  cooperative throughout examination Eyes:  PERRLA, EOMIl no scleral icterus Mouth:  MMM Lungs:  Normal respiratory effort, chest expands symmetrically. Lungs are clear to auscultation, no crackles or wheezes. Heart:  RRR 2/6 SEM stable Abdomen:  BS +, NT, ND Pulses:  2+ Extremities: Neurovascularly intact 1+ edema at ankles Patient does have an ingrown toe on the left large toe. Patient has pain on the medial and lateral aspects to ingrown. Mild erythema tender to palpation no discharge from toenail that  After verbal and written consent patient did have 5 cc of lidocaine place for a digital block after Betadine was placed on skin, patient did have elevated toe which did eat for 2 edges of the toenail able to clip it just to the lateral excision of the toenail bilaterally minimal blood loss patient tolerated well bandaged with Neosporin and bandage.  Assessment & Plan:

## 2012-06-18 DIAGNOSIS — D649 Anemia, unspecified: Secondary | ICD-10-CM | POA: Diagnosis not present

## 2012-06-19 ENCOUNTER — Telehealth: Payer: Self-pay | Admitting: Family Medicine

## 2012-06-19 NOTE — Telephone Encounter (Signed)
Patient picked up his compression hose and daughter is wanting to know how long does he need to use them.  Is it daily or what.

## 2012-06-22 NOTE — Telephone Encounter (Signed)
LM thru video relay interpretor. Philip Matthews, [name]

## 2012-06-22 NOTE — Telephone Encounter (Signed)
Yes please call him likely would need to wear them daily to help with swelling if he can tolerate it.

## 2012-06-24 DIAGNOSIS — D508 Other iron deficiency anemias: Secondary | ICD-10-CM | POA: Diagnosis not present

## 2012-06-25 ENCOUNTER — Other Ambulatory Visit (HOSPITAL_COMMUNITY): Payer: Self-pay | Admitting: *Deleted

## 2012-06-29 ENCOUNTER — Encounter (HOSPITAL_COMMUNITY)
Admission: RE | Admit: 2012-06-29 | Discharge: 2012-06-29 | Disposition: A | Discharge status: Home / Self Care | Payer: Medicare Other | Source: Ambulatory Visit | Attending: Nephrology | Admitting: Nephrology

## 2012-06-29 DIAGNOSIS — D509 Iron deficiency anemia, unspecified: Secondary | ICD-10-CM | POA: Insufficient documentation

## 2012-06-29 MED ORDER — SODIUM CHLORIDE 0.9 % IV SOLN: Freq: Once | INTRAVENOUS | Status: DC

## 2012-06-29 MED ORDER — SODIUM CHLORIDE 0.9 % IV SOLN
1020.0000 mg | Freq: Once | INTRAVENOUS | Status: AC
  Administered 2012-06-29: 1020 mg via INTRAVENOUS
  Filled 2012-06-29: qty 34

## 2012-06-30 DIAGNOSIS — Z5181 Encounter for therapeutic drug level monitoring: Secondary | ICD-10-CM | POA: Diagnosis not present

## 2012-06-30 DIAGNOSIS — D649 Anemia, unspecified: Secondary | ICD-10-CM | POA: Diagnosis not present

## 2012-07-07 DIAGNOSIS — D649 Anemia, unspecified: Secondary | ICD-10-CM | POA: Diagnosis not present

## 2012-07-14 DIAGNOSIS — H4011X0 Primary open-angle glaucoma, stage unspecified: Secondary | ICD-10-CM | POA: Diagnosis not present

## 2012-07-14 DIAGNOSIS — H409 Unspecified glaucoma: Secondary | ICD-10-CM | POA: Diagnosis not present

## 2012-07-14 DIAGNOSIS — D649 Anemia, unspecified: Secondary | ICD-10-CM | POA: Diagnosis not present

## 2012-07-14 DIAGNOSIS — N183 Chronic kidney disease, stage 3 unspecified: Secondary | ICD-10-CM | POA: Diagnosis not present

## 2012-07-28 ENCOUNTER — Other Ambulatory Visit: Payer: Self-pay | Admitting: Cardiovascular Disease

## 2012-07-28 ENCOUNTER — Other Ambulatory Visit: Payer: Self-pay | Admitting: Family Medicine

## 2012-07-28 DIAGNOSIS — D649 Anemia, unspecified: Secondary | ICD-10-CM | POA: Diagnosis not present

## 2012-07-28 DIAGNOSIS — Z5181 Encounter for therapeutic drug level monitoring: Secondary | ICD-10-CM | POA: Diagnosis not present

## 2012-08-05 ENCOUNTER — Encounter: Payer: Self-pay | Admitting: Family Medicine

## 2012-08-05 DIAGNOSIS — H401190 Primary open-angle glaucoma, unspecified eye, stage unspecified: Secondary | ICD-10-CM | POA: Insufficient documentation

## 2012-08-11 ENCOUNTER — Other Ambulatory Visit: Payer: Self-pay | Admitting: Family Medicine

## 2012-08-11 DIAGNOSIS — D649 Anemia, unspecified: Secondary | ICD-10-CM | POA: Diagnosis not present

## 2012-08-25 ENCOUNTER — Telehealth: Payer: Self-pay | Admitting: *Deleted

## 2012-08-25 NOTE — Telephone Encounter (Signed)
Message left on the Pharmacy/Physician line from [facility] calling with a PT/INR result on Philip Matthews.  States his PT is 23.6 INR is 2.0.  Message states patient takes 5 mg daily of Warfarin.  Unable to see where we are following patient's INR's.  I called Dr. Terrilee Files who was this patients previous PCP and he states we have been following Philip Matthews but he has been so well controlled that his orders to [facility] was as long as patient is in range, they did not need to call us with results and to leave Warfarin the same.  Dr. Tana Conch is new PCP.  Will route this to him for review.  Ileana Ladd

## 2012-08-26 NOTE — Telephone Encounter (Signed)
INR at goal. Would like to know frequency that [facility] is checking and what previous values have been. Would simply get repeat within a week given history of it being well controlled. I also will ask nursing staff to ask [facility] why they called in this value given that it was in range and if they had a particular concern.

## 2012-08-26 NOTE — Telephone Encounter (Signed)
I have placed a call to [facility] to ask if previous PT/INR results could be faxed to [place] so Dr. Durene Cal can review them.  The last documented call with a INR result is 03/24/3012 so I have ask for all reading from that point on.  I also found out that [place], Mr. Scruton's normal nurse,  is out with pneumonia and someone was covering for her yesterday and that is probably why it got called to [place] yesterday.  I was also informed that as long as he is therapeutic [facility] usually checks him every 2 to 4 weeks.  Will forward to Dr. Durene Cal for review.  Ileana Ladd

## 2012-09-01 ENCOUNTER — Telehealth: Payer: Self-pay | Admitting: *Deleted

## 2012-09-01 ENCOUNTER — Other Ambulatory Visit: Payer: Self-pay | Admitting: Family Medicine

## 2012-09-01 NOTE — Telephone Encounter (Signed)
Patient in office today, asking for refills on furosemide and kionex to be sent to [facility] on [address]. Has appointment with new PCP on 9/10.

## 2012-09-02 MED ORDER — SODIUM POLYSTYRENE SULFONATE 15 GM/60ML PO SUSP: 15.0000 g | Freq: Once | ORAL | Status: DC

## 2012-09-02 MED ORDER — FUROSEMIDE 40 MG PO TABS: 20.0000 mg | ORAL_TABLET | Freq: Every day | ORAL | Status: DC

## 2012-09-02 NOTE — Telephone Encounter (Signed)
Refilled lasix and kionex

## 2012-09-08 ENCOUNTER — Ambulatory Visit (INDEPENDENT_AMBULATORY_CARE_PROVIDER_SITE_OTHER): Payer: Medicare Other | Admitting: Family Medicine

## 2012-09-08 ENCOUNTER — Other Ambulatory Visit: Payer: Self-pay | Admitting: Family Medicine

## 2012-09-08 ENCOUNTER — Encounter: Payer: Self-pay | Admitting: Family Medicine

## 2012-09-08 VITALS — BP 159/68 | HR 77 | Temp 97.9°F | Ht 64.0 in | Wt 153.0 lb

## 2012-09-08 DIAGNOSIS — D649 Anemia, unspecified: Secondary | ICD-10-CM | POA: Diagnosis not present

## 2012-09-08 DIAGNOSIS — R609 Edema, unspecified: Secondary | ICD-10-CM | POA: Diagnosis not present

## 2012-09-08 DIAGNOSIS — E119 Type 2 diabetes mellitus without complications: Secondary | ICD-10-CM | POA: Diagnosis not present

## 2012-09-08 DIAGNOSIS — J309 Allergic rhinitis, unspecified: Secondary | ICD-10-CM

## 2012-09-08 DIAGNOSIS — H1130 Conjunctival hemorrhage, unspecified eye: Secondary | ICD-10-CM | POA: Insufficient documentation

## 2012-09-08 DIAGNOSIS — R600 Localized edema: Secondary | ICD-10-CM

## 2012-09-08 DIAGNOSIS — I10 Essential (primary) hypertension: Secondary | ICD-10-CM | POA: Diagnosis not present

## 2012-09-08 MED ORDER — LORATADINE 10 MG PO TABS: 10.0000 mg | ORAL_TABLET | Freq: Every day | ORAL | Status: DC

## 2012-09-08 NOTE — Assessment & Plan Note (Signed)
Encouraged compression stockings. Patient to restart lasix as previously prescribed. No signs to indicate heart failure as cause. Stress test in 2011 showed normal LV size and function.

## 2012-09-08 NOTE — Progress Notes (Signed)
Subjective:  ASL interpreter used. Accompanied as well by deaf daughter at visit.   1. Left eye redness-Patient describes feeling something grainy feeling in his eyes last week worse on the left. He rubbed the spot and several days later noted redness throughout his sclera most intensely below his the bottom of his iris. No vision changes or blurry vision. Denies nausea/vomiting/fever/chills/fatigue/overall sick feelings/headache. The eye redness has gotten much better over the last few days. Patient does have glaucoma and saw his eye doctor in July. Continues to take Timolol. Taking coumadin and reportedly therapeutic INR.   2. Cough-patient has had a cough since Friday. Also complains of rhinnorhea, itchy watery eyes. States happens every fall.   3. Isolated systolicHypertension-goal SBP <150 BP Readings from Last 3 Encounters:  09/08/12 159/68  06/29/12 147/64  06/17/12 138/69  Compliant with medications-yes without side effects but has been out of lasix for 3 weeks.  Denies any CP, HA, SOB, blurry vision, LE edema, transient weakness, orthopnea, PND.   4. Lower extremity edema-worse on right leg but patient has had injury to this leg a long time ago and always seems to be worse. Taking 5mg  amlodipine as previously isntructed asnd says swelling is better. Swelling was worse for a period when not wearing compression stockings but he is doing better with this now. ROS per HTN with following addition, no pain in calf.   ROS--See HPI  Past Medical History-smoking status noted: nonsmoker.  Reviewed problem list.  Medications- reviewed and updated Chief complaint-noted  Objective: BP 159/68  Pulse 77  Temp 97.9 F (36.6 C) (Oral)  Ht 5\' 4"  (1.626 m)  Wt 153 lb (69.4 kg)  BMI 26.26 kg/m2 Gen: NAD CV: RRR no mrg Eye: Pupils 4-81mm and reactive. Subconjunctival hemorrhage noted underneath right eye approximately 1 cm x 1/2 cm.  HEENT: cobblestone pattern on pharynx with erythema,  rhinorrhea noted. Ears with normal landmarks and normal light reflex no fluid.  Lungs: CTAB Ext: 1+ pitting edema on right leg with trace edema on left. Not wearing compression stockigns   Assessment/Plan: See problem oriented charted

## 2012-09-08 NOTE — Patient Instructions (Addendum)
Dear Philip Matthews,   It was great to meet you today. Thank you for coming to clinic. Please read below regarding the issues that we discussed.   1. Seasonal allergies-I am going to send in a prescription that I want you to take for the next month.  2. Leg swelling-I would encourage you to take your medicines as prescribed and to use your compression stockings.  3. I am glad your eye redness is getting better. Come back immediately if you develop blurry vision or if the redness does not continue to improve. I think this is a little bit of trapped blood which should continue to get better over the next few weeks.   Please follow up in clinic within 1 month for your next diabetes check up. Please call earlier if you have any questions or concerns.   Sincerely,  Dr. Tana Conch

## 2012-09-08 NOTE — Assessment & Plan Note (Addendum)
claritin x 1 month. Likely will need each fall. Can continue if symptoms recur after stopping. Coughing from this may have caused subconjunctival hemorrhage vs. Rubbing eye.

## 2012-09-08 NOTE — Assessment & Plan Note (Signed)
Slightly above goal today. No red flags. Patient will restart Lasix (out for 3 weeks) and follow up with me within a month for DM f/u and recheck BP. Also has home health nurse come out every other week and she will check BP in 2 weeks. Alert [place] if elevated.

## 2012-09-08 NOTE — Assessment & Plan Note (Signed)
Improving. Suspect could have been caused by patient rubbing his eye and more likely to occur because of being on coumadin. No red flags for closed angle glaucoma. Red flags given for follow up. No treatment required.

## 2012-09-11 ENCOUNTER — Other Ambulatory Visit: Payer: Self-pay | Admitting: *Deleted

## 2012-09-11 MED ORDER — METOPROLOL SUCCINATE ER 100 MG PO TB24: 100.0000 mg | ORAL_TABLET | Freq: Every day | ORAL | Status: DC

## 2012-09-11 NOTE — Addendum Note (Signed)
Addended by: Jone Baseman D on: 09/11/2012 04:22 PM   Modules accepted: Orders

## 2012-09-22 DIAGNOSIS — N2581 Secondary hyperparathyroidism of renal origin: Secondary | ICD-10-CM | POA: Diagnosis not present

## 2012-09-22 DIAGNOSIS — D649 Anemia, unspecified: Secondary | ICD-10-CM | POA: Diagnosis not present

## 2012-10-06 ENCOUNTER — Other Ambulatory Visit: Payer: Self-pay | Admitting: *Deleted

## 2012-10-06 DIAGNOSIS — E875 Hyperkalemia: Secondary | ICD-10-CM | POA: Diagnosis not present

## 2012-10-06 DIAGNOSIS — M109 Gout, unspecified: Secondary | ICD-10-CM

## 2012-10-06 DIAGNOSIS — N2581 Secondary hyperparathyroidism of renal origin: Secondary | ICD-10-CM | POA: Diagnosis not present

## 2012-10-06 DIAGNOSIS — I10 Essential (primary) hypertension: Secondary | ICD-10-CM | POA: Diagnosis not present

## 2012-10-06 DIAGNOSIS — D649 Anemia, unspecified: Secondary | ICD-10-CM | POA: Diagnosis not present

## 2012-10-06 MED ORDER — FEBUXOSTAT 80 MG PO TABS: ORAL_TABLET | ORAL | Status: DC

## 2012-10-07 DIAGNOSIS — E1149 Type 2 diabetes mellitus with other diabetic neurological complication: Secondary | ICD-10-CM | POA: Diagnosis not present

## 2012-10-07 DIAGNOSIS — Q828 Other specified congenital malformations of skin: Secondary | ICD-10-CM | POA: Diagnosis not present

## 2012-10-07 DIAGNOSIS — L608 Other nail disorders: Secondary | ICD-10-CM | POA: Diagnosis not present

## 2012-10-14 ENCOUNTER — Encounter: Payer: Self-pay | Admitting: Family Medicine

## 2012-10-14 ENCOUNTER — Ambulatory Visit (INDEPENDENT_AMBULATORY_CARE_PROVIDER_SITE_OTHER): Payer: Medicare Other | Admitting: Family Medicine

## 2012-10-14 VITALS — BP 153/70 | HR 66 | Temp 98.1°F | Ht 64.0 in | Wt 156.0 lb

## 2012-10-14 DIAGNOSIS — Z23 Encounter for immunization: Secondary | ICD-10-CM

## 2012-10-14 DIAGNOSIS — E119 Type 2 diabetes mellitus without complications: Secondary | ICD-10-CM

## 2012-10-14 DIAGNOSIS — I10 Essential (primary) hypertension: Secondary | ICD-10-CM

## 2012-10-14 NOTE — Patient Instructions (Addendum)
Everything from a diabetes and blood pressure perspective looks pretty good currently. There is one medicine I am considering called Lisinopril but I want your kidney doctor to weigh in. I would call your kidney doctor for a follow up appointment as you have not seen them in a year and a half. Follow up with me in about 3-4 months. Keep taking all of your medicines as prescribed.   You got your flu shot today!  I look forward to seeing you at your next appointment,  Dr. Durene Cal

## 2012-10-14 NOTE — Assessment & Plan Note (Addendum)
Slightly above goal. Considered starting Lisinopril as BP still slightly elevated. Patient has seen [facility] in the past though and this was not recommended. Only slightly above goal today and patient reports BP better at home. Will follow up in 3 months. Encouraged patient to follow up with [facility] to reassess need for ace inhibitor.

## 2012-10-14 NOTE — Assessment & Plan Note (Signed)
Well controlled (a1c 6.9) on glipizide and low dose insulin. No hypoglycemia. Considered weaning glipizide and following up and increasing insulin as needed to simplify regimen but will not make changes when daughter not here as she helps tremendously with management. Also difficult to titrate as he does not know CBGs other than to know not low.

## 2012-10-14 NOTE — Progress Notes (Signed)
Subjective:   1. Hypertension- BP Readings from Last 3 Encounters:  10/14/12 153/70  09/08/12 159/68  06/29/12 147/64  Home BP monitoring-yes, but doesn't have book today Compliant with medications-yes without side effects Denies any CP, HA, SOB, blurry vision, LE edema worsening (actually improved), transient weakness, orthopnea, PND.  2.  DIABETES Type II Medications taking and tolerating-yes Blood Sugars per patient-fasting-daughter checks it, patient not sure Diet-compliant with diet avoiding poor food choices Regular Exercise-[facility] most days and 30 minutes wakling  Last eye exam-unsure Last foot exam-today Last microalbumin/on ace inhibitor-not on ace, uncertain when last Daily foot monitoring-yes Pneumovax-reports having in past though unsure when  ROS- (-)Polyuria/Polydipsia/nocturia, (-) Vision changes, (+ loss of sensation) feet or hand numbness/pain/tingling. Hypoglycemia symptoms (shaky, sweaty, hungry,  anxious, tremor, palpitations, confusion, behavior change)-none  Diabetic Labs:  Lab Results  Component Value Date   HGBA1C 6.9 09/08/2012   HGBA1C 6.5 05/29/2012   HGBA1C 6.5 01/14/2012     ROS--See HPI  Past Medical History-smoking status noted: former smoker (cant even remember how long ago.  Reviewed problem list.  Medications- reviewed and updated Chief complaint-noted  Objective: BP 153/70  Pulse 66  Temp 98.1 F (36.7 C) (Oral)  Ht 5\' 4"  (1.626 m)  Wt 156 lb (70.761 kg)  BMI 26.78 kg/m2 Gen: NAD CV: RRR no mrg Lungs: CTAB Ext: 2+ pulses both feet, trace edema on left vs 1+ on right (chronic on both) Monofilament exam: no sensation on bilateral palms, no sensation on feet or legs until thigh.    Assessment/Plan: See problem oriented charted  Flu shot today  Indicating urine micrcoalbumin not indicated until follows up with [facility].

## 2012-10-20 ENCOUNTER — Other Ambulatory Visit: Payer: Self-pay | Admitting: Family Medicine

## 2012-10-20 DIAGNOSIS — E875 Hyperkalemia: Secondary | ICD-10-CM | POA: Diagnosis not present

## 2012-10-20 DIAGNOSIS — E119 Type 2 diabetes mellitus without complications: Secondary | ICD-10-CM | POA: Diagnosis not present

## 2012-11-03 DIAGNOSIS — D649 Anemia, unspecified: Secondary | ICD-10-CM | POA: Diagnosis not present

## 2012-11-10 ENCOUNTER — Telehealth: Payer: Self-pay | Admitting: Family Medicine

## 2012-11-10 DIAGNOSIS — E875 Hyperkalemia: Secondary | ICD-10-CM

## 2012-11-10 DIAGNOSIS — E119 Type 2 diabetes mellitus without complications: Secondary | ICD-10-CM

## 2012-11-10 NOTE — Telephone Encounter (Signed)
Patient needs a rx for Kinotex and strips and needles(has been out for 2 weeks).  He uses [facility].

## 2012-11-11 ENCOUNTER — Other Ambulatory Visit: Payer: Self-pay | Admitting: *Deleted

## 2012-11-11 ENCOUNTER — Other Ambulatory Visit: Payer: Self-pay | Admitting: Family Medicine

## 2012-11-11 DIAGNOSIS — E119 Type 2 diabetes mellitus without complications: Secondary | ICD-10-CM

## 2012-11-11 DIAGNOSIS — E875 Hyperkalemia: Secondary | ICD-10-CM | POA: Insufficient documentation

## 2012-11-11 MED ORDER — GLUCOSE BLOOD VI STRP: ORAL_STRIP | Status: DC

## 2012-11-11 MED ORDER — INSULIN PEN NEEDLE 32G X 6 MM MISC: Status: DC

## 2012-11-11 MED ORDER — "NEEDLE (DISP) 25G X 5/8"" MISC": Status: DC

## 2012-11-11 MED ORDER — GLIPIZIDE ER 10 MG PO TB24: 10.0000 mg | ORAL_TABLET | Freq: Every day | ORAL | Status: DC

## 2012-11-11 MED ORDER — SODIUM POLYSTYRENE SULFONATE 15 GM/60ML PO SUSP: 15.0000 g | Freq: Every day | ORAL | Status: DC | PRN

## 2012-11-11 MED ORDER — SODIUM POLYSTYRENE SULFONATE 15 GM/60ML PO SUSP: ORAL | Status: DC

## 2012-11-11 NOTE — Telephone Encounter (Signed)
Refilled per request. Added problem hyperkalemia as patient has been taking kayexalate/kionex Tues/Fri to prevent per previous PCP.

## 2012-11-12 ENCOUNTER — Telehealth: Payer: Self-pay | Admitting: *Deleted

## 2012-11-12 NOTE — Telephone Encounter (Signed)
Recieved call form pharmacy regarding two Rx that were sent electronically yesterday .Need clarification and directions on Kionex. How much does he take on Tuesday and  Thursday. Also RX sent for " Needles " 25 gauge  5/8 inch with directions to check BS three times daily. Please clarify, do you mean lancets or pen needles or syringes?

## 2012-11-12 NOTE — Telephone Encounter (Signed)
Spoke with pharmacy and clarified. °

## 2012-11-13 DIAGNOSIS — H4011X0 Primary open-angle glaucoma, stage unspecified: Secondary | ICD-10-CM | POA: Diagnosis not present

## 2012-11-13 DIAGNOSIS — H409 Unspecified glaucoma: Secondary | ICD-10-CM | POA: Diagnosis not present

## 2012-12-01 ENCOUNTER — Other Ambulatory Visit: Payer: Self-pay | Admitting: *Deleted

## 2012-12-01 MED ORDER — POLYETHYLENE GLYCOL 3350 17 GM/SCOOP PO POWD: 17.0000 g | Freq: Two times a day (BID) | ORAL | Status: DC

## 2012-12-01 NOTE — Telephone Encounter (Signed)
Refilled miralax 

## 2012-12-04 ENCOUNTER — Telehealth: Payer: Self-pay | Admitting: Family Medicine

## 2012-12-04 NOTE — Telephone Encounter (Signed)
Ok

## 2012-12-04 NOTE — Telephone Encounter (Signed)
Attempted to call # listed. Confirmed # with person I was speaking with. Not [facility] or [facility]. Will ask staff to call to give verbal order. This written order can be passed on as verbal order to continue [facility].

## 2012-12-04 NOTE — Telephone Encounter (Signed)
Since we are unable to reach [place] Sink, will fax over this phone message as verbal orders to [facility] [phone]. Ananias Kolander, [name]

## 2012-12-04 NOTE — Telephone Encounter (Signed)
Nurse from [facility] is calling for recertification orders to continue every 2 weeks.  A verbal order is ok.

## 2012-12-08 ENCOUNTER — Telehealth: Payer: Self-pay | Admitting: *Deleted

## 2012-12-08 NOTE — Telephone Encounter (Signed)
Returned call. [place] Sink already had information she needed.

## 2012-12-08 NOTE — Telephone Encounter (Signed)
[place] Sink, RN with [facility] calling requesting to speak with Dr. Durene Cal about resumption on nursing care to go back in and fill patient's pill box. She can be reached at [phone].  Ileana Ladd

## 2012-12-15 ENCOUNTER — Other Ambulatory Visit: Payer: Self-pay | Admitting: Family Medicine

## 2012-12-15 DIAGNOSIS — I808 Phlebitis and thrombophlebitis of other sites: Secondary | ICD-10-CM | POA: Diagnosis not present

## 2012-12-15 DIAGNOSIS — E119 Type 2 diabetes mellitus without complications: Secondary | ICD-10-CM | POA: Diagnosis not present

## 2012-12-16 NOTE — Telephone Encounter (Signed)
Refilled synthroid

## 2012-12-18 ENCOUNTER — Other Ambulatory Visit: Payer: Self-pay | Admitting: Family Medicine

## 2012-12-28 ENCOUNTER — Telehealth: Payer: Self-pay | Admitting: *Deleted

## 2012-12-28 ENCOUNTER — Telehealth: Payer: Self-pay | Admitting: Family Medicine

## 2012-12-28 DIAGNOSIS — E119 Type 2 diabetes mellitus without complications: Secondary | ICD-10-CM

## 2012-12-28 MED ORDER — INSULIN LISPRO 100 UNIT/ML ~~LOC~~ SOLN: SUBCUTANEOUS | Status: DC

## 2012-12-28 NOTE — Telephone Encounter (Signed)
Formulary of new provider is for Humalog, have changed Rx and will ask nursing to inform. We can provide other supplies for patient as needed if different from novolog flexpen supplies.

## 2012-12-28 NOTE — Telephone Encounter (Signed)
PA  required for  Novolog flexpen. Form given to Dr. Durene Cal.

## 2012-12-30 DIAGNOSIS — E119 Type 2 diabetes mellitus without complications: Secondary | ICD-10-CM | POA: Diagnosis not present

## 2012-12-31 ENCOUNTER — Other Ambulatory Visit: Payer: Self-pay | Admitting: *Deleted

## 2012-12-31 ENCOUNTER — Telehealth: Payer: Self-pay | Admitting: Family Medicine

## 2012-12-31 DIAGNOSIS — I81 Portal vein thrombosis: Secondary | ICD-10-CM

## 2012-12-31 DIAGNOSIS — E119 Type 2 diabetes mellitus without complications: Secondary | ICD-10-CM

## 2012-12-31 MED ORDER — METOPROLOL SUCCINATE ER 100 MG PO TB24: 100.0000 mg | ORAL_TABLET | Freq: Every day | ORAL | Status: DC

## 2012-12-31 MED ORDER — INSULIN LISPRO 100 UNIT/ML ~~LOC~~ SOLN: SUBCUTANEOUS | Status: DC

## 2012-12-31 MED ORDER — FUROSEMIDE 40 MG PO TABS: 20.0000 mg | ORAL_TABLET | Freq: Every day | ORAL | Status: DC

## 2012-12-31 MED ORDER — WARFARIN SODIUM 5 MG PO TABS: 7.5000 mg | ORAL_TABLET | Freq: Every day | ORAL | Status: DC

## 2012-12-31 NOTE — Addendum Note (Signed)
Addended by: Shelva Majestic on: 12/31/2012 05:53 PM   Modules accepted: Orders

## 2012-12-31 NOTE — Telephone Encounter (Signed)
Reordered flexpen. Vial ordered in error previously.

## 2012-12-31 NOTE — Telephone Encounter (Signed)
Called in results of INR  PT- 18.1 INR - 1.5   Also is asking for the patient to get the flex pen for his insulin since he is not able to use the vial - too difficult for him.

## 2012-12-31 NOTE — Telephone Encounter (Signed)
Please inform patient and [facility] nurse to have patient take 1.5 pills of the 5mg  Warfarin over the next week and then have patient recheck. Previously he had been well controlled with INR of 2 or above on 1.5 pills MWFSatSunday and 1 pill on other days (tues/Thurs) of week. May need future adjustments, would ask for weekly INR checks until stable.

## 2013-01-01 MED ORDER — WARFARIN SODIUM 5 MG PO TABS: 7.5000 mg | ORAL_TABLET | Freq: Every day | ORAL | Status: DC

## 2013-01-01 NOTE — Telephone Encounter (Signed)
Informed Philip Matthews that Rx was sent in.  She advises that medicaid will only allow her to go out every 2 weeks, therefore she will not be able to go out until the 14th, does that change anything with the dosage?  Also he will need a refill on the coumadin.  Advised I would send a message to MD for further instructions.  Garry Nicolini, [name]

## 2013-01-01 NOTE — Addendum Note (Signed)
Addended by: Shelva Majestic on: 01/01/2013 12:30 PM   Modules accepted: Orders

## 2013-01-01 NOTE — Telephone Encounter (Signed)
[place] Sink informed and will text daughter (as daughter is deaf as well) and let her know.Philip Matthews, Philip Matthews

## 2013-01-01 NOTE — Telephone Encounter (Signed)
Rx for coumadin sent in. No change in dosing. Patient should come in a week for lab visit for INR (future order placed). Please inform patient.

## 2013-01-06 ENCOUNTER — Telehealth: Payer: Self-pay | Admitting: Family Medicine

## 2013-01-06 ENCOUNTER — Ambulatory Visit (INDEPENDENT_AMBULATORY_CARE_PROVIDER_SITE_OTHER): Payer: Medicare Other | Admitting: *Deleted

## 2013-01-06 DIAGNOSIS — I81 Portal vein thrombosis: Secondary | ICD-10-CM

## 2013-01-06 DIAGNOSIS — Z7901 Long term (current) use of anticoagulants: Secondary | ICD-10-CM

## 2013-01-06 DIAGNOSIS — E119 Type 2 diabetes mellitus without complications: Secondary | ICD-10-CM

## 2013-01-06 NOTE — Telephone Encounter (Signed)
Pt was in today for a lab visit.  Asked Kendal Hymen why they could not pick up warfarin/humalog.  Called pharmacy to clarify.  Warfarin was never received, so I called in verbally.  As far as the humalog, insurance would not cover until the 23th.  Pharmacy will fill on the 23.  LM with TTY interpretor of the above. Azariel Banik, [name]

## 2013-01-06 NOTE — Telephone Encounter (Signed)
Patient needs a refill of Warfarin.  He uses [facility] on [address].

## 2013-01-13 ENCOUNTER — Ambulatory Visit: Payer: Medicare Other | Admitting: Family Medicine

## 2013-01-15 DIAGNOSIS — Q828 Other specified congenital malformations of skin: Secondary | ICD-10-CM | POA: Diagnosis not present

## 2013-01-15 DIAGNOSIS — L608 Other nail disorders: Secondary | ICD-10-CM | POA: Diagnosis not present

## 2013-01-15 DIAGNOSIS — E1149 Type 2 diabetes mellitus with other diabetic neurological complication: Secondary | ICD-10-CM | POA: Diagnosis not present

## 2013-02-08 ENCOUNTER — Other Ambulatory Visit: Payer: Self-pay | Admitting: Family Medicine

## 2013-02-18 DIAGNOSIS — I129 Hypertensive chronic kidney disease with stage 1 through stage 4 chronic kidney disease, or unspecified chronic kidney disease: Secondary | ICD-10-CM | POA: Diagnosis not present

## 2013-02-18 DIAGNOSIS — M329 Systemic lupus erythematosus, unspecified: Secondary | ICD-10-CM | POA: Diagnosis not present

## 2013-02-23 ENCOUNTER — Telehealth: Payer: Self-pay | Admitting: Family Medicine

## 2013-02-23 MED ORDER — CALCITRIOL 0.25 MCG PO CAPS: 0.2500 ug | ORAL_CAPSULE | ORAL | Status: DC

## 2013-02-23 MED ORDER — WARFARIN SODIUM 5 MG PO TABS: 7.5000 mg | ORAL_TABLET | Freq: Every day | ORAL | Status: DC

## 2013-02-23 MED ORDER — CLONIDINE HCL 0.2 MG/24HR TD PTWK: MEDICATED_PATCH | TRANSDERMAL | Status: DC

## 2013-02-23 NOTE — Telephone Encounter (Signed)
Returned call needed following information:  1. Gave RN Dr. Deirdre Priest name for billing purposes of medicare pt  2. Rn is going to check PT/INR today and will call back if dosage needs to be changed  3. Pt needs refills on coumadin, calcitriol, clonidine  Will forward to MD Fleeger, [name]

## 2013-02-23 NOTE — Telephone Encounter (Signed)
Refilled requested medications.

## 2013-02-23 NOTE — Telephone Encounter (Signed)
Pls return call from [facility] nurse, Harris @ [phone], concerning Philip Matthews

## 2013-03-09 ENCOUNTER — Telehealth: Payer: Self-pay | Admitting: Family Medicine

## 2013-03-09 DIAGNOSIS — I4891 Unspecified atrial fibrillation: Secondary | ICD-10-CM

## 2013-03-09 LAB — HEPATIC FUNCTION PANEL: AST: 36 U/L (ref 14–40)

## 2013-03-09 NOTE — Telephone Encounter (Signed)
Is at pt home  INR 1.5 PT 18.4 Taking 5mg  Needs to know how to dose this - please call daughter to let her know how to dose it. [phone]

## 2013-03-10 ENCOUNTER — Telehealth: Payer: Self-pay | Admitting: Family Medicine

## 2013-03-10 NOTE — Telephone Encounter (Signed)
Patient is calling back asking what the dose of the Coumadin.  He says to go ahead and leave a message letting him know what to do. Interpreter is requesting that the message be texted to [phone].  That is for texting only.

## 2013-03-10 NOTE — Telephone Encounter (Signed)
LM w/TTY advising of correct dose of coumadin and to rt in 1wk Miles, 2 wk w/ [place] Sink

## 2013-03-10 NOTE — Telephone Encounter (Signed)
Opened in error

## 2013-03-10 NOTE — Telephone Encounter (Signed)
LM w/TTY to have dgt rt our call.

## 2013-03-10 NOTE — Telephone Encounter (Signed)
Patient is supposed to be taking 7.5 mg or 1.5 pills (not just 1 pill). Will ask staff to inform patient through TTY. Recheck in 1 week lab visit with Kendal Hymen then 2 weeks with  Sink.

## 2013-03-19 NOTE — Telephone Encounter (Signed)
Has this been addressed?

## 2013-03-20 NOTE — Telephone Encounter (Signed)
Yes see phone note 3/11.

## 2013-03-23 ENCOUNTER — Telehealth: Payer: Self-pay | Admitting: Family Medicine

## 2013-03-23 DIAGNOSIS — I81 Portal vein thrombosis: Secondary | ICD-10-CM

## 2013-03-23 NOTE — Telephone Encounter (Signed)
Philip Matthews reports results from today,03/25/20014, as follows: R finger: INR  4.9    PT  58.4 L finger:  INR  4.6    PT  55.5 Pt states has been eating a lot of leafy greens this past week.  [name] instructed pt to stop.  Will forward results to MD for further instructions.  [name] L

## 2013-03-23 NOTE — Telephone Encounter (Signed)
Nurse would like to speak with someone about the patients pt/inr level.

## 2013-03-25 NOTE — Telephone Encounter (Signed)
Message given to [facility]

## 2013-03-25 NOTE — Telephone Encounter (Signed)
Agree with home health RN on reducing lots of green leafy vegetables. Additionally, would like patient to return to previous schedule of 1 pill only on Tuesday an Thursday with 1.5 pills on all other days. Come to clinic for INR check in 1 week then [facility] in 2 weeks.   Nursing staff to inform patient through TTY.

## 2013-03-25 NOTE — Telephone Encounter (Signed)
LMOVM for [place] Sink to return call. Fleeger, [name] -

## 2013-03-26 ENCOUNTER — Encounter: Payer: Self-pay | Admitting: Cardiovascular Disease

## 2013-03-26 ENCOUNTER — Ambulatory Visit (INDEPENDENT_AMBULATORY_CARE_PROVIDER_SITE_OTHER): Payer: Medicare Other | Admitting: Cardiovascular Disease

## 2013-03-26 VITALS — BP 162/69 | HR 59 | Wt 157.0 lb

## 2013-03-26 DIAGNOSIS — I81 Portal vein thrombosis: Secondary | ICD-10-CM

## 2013-03-26 DIAGNOSIS — E782 Mixed hyperlipidemia: Secondary | ICD-10-CM

## 2013-03-26 DIAGNOSIS — I10 Essential (primary) hypertension: Secondary | ICD-10-CM

## 2013-03-26 NOTE — Assessment & Plan Note (Signed)
Cholesterol is at goal.  Continue current dose of statin and diet Rx.  No myalgias or side effects.  F/U  LFT's in 6 months. No results found for this basename: LDLCALC             

## 2013-03-26 NOTE — Progress Notes (Signed)
Patient ID: Philip Matthews, male   DOB: 1928/12/10, 79 y.o.   MRN: [id number] Last seen 2011 for surgical clearance for rotator cuff with normal dobutamine echo He is deaf and an interpreter was used througout the interview.He has no documented CAD. He has not had any recent surgery. He is on coumadin but the indication is not clear. 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 He has type 2 DM, hypertensio and elevated lipids. A lot of his medical care had been done in [place] before and this is where the coumadin was started. There is a ? of connective tissue disease or lupus. He walks but is otherwise sedentary. He denies SSCP, palpitations, dyspnea, edema or syncope. Discussed diet and issues with bread.    . ROS: Denies fever, malais, weight loss, blurry vision, decreased visual acuity, cough, sputum, SOB, hemoptysis, pleuritic pain, palpitaitons, heartburn, abdominal pain, melena, lower extremity edema, claudication, or rash.  All other systems reviewed and negative  General: Affect appropriate Healthy:  appears stated age HEENT: normal Deaf  Neck supple with no adenopathy JVP normal no bruits no thyromegaly Lungs clear with no wheezing and good diaphragmatic motion Heart:  S1/S2 no murmur, no rub, gallop or click PMI normal Abdomen: benighn, BS positve, no tenderness, no AAA no bruit.  No HSM or HJR Distal pulses intact with no bruits No edema Neuro non-focal Skin warm and dry No muscular weakness   Current Outpatient Prescriptions  Medication Sig Dispense Refill  . amLODipine (NORVASC) 10 MG tablet Take 0.5 tablets (5 mg total) by mouth daily.  90 tablet  3  . aspirin 81 MG EC tablet Take 81 mg by mouth daily.        . Blood Glucose Monitoring Suppl (PRODIGY POCKET BLOOD GLUCOSE) W/DEVICE KIT as directed.        . calcitRIOL (ROCALTROL) 0.25 MCG capsule Take 1 capsule (0.25 mcg total) by mouth every Monday, Wednesday, and Friday.  30 capsule  5  . Calcium (CALCIUM 500/D) 500-125 MG-UNIT  TABS Take 1 tablet by mouth 2 (two) times daily.        . cloNIDine (CATAPRES - DOSED IN MG/24 HR) 0.2 mg/24hr patch APPLY 1 PATCH ON SKIN AND CHANGE WEEKLY  12 patch  3  . docusate sodium (COLACE) 100 MG capsule Take 100 mg by mouth 2 (two) times daily.        . Easy Touch Lancets MISC Please use One touch ultra lancets please.  Not in computer to use.  Thank you. Use as directed.  100 each  6  . Elastic Bandages & Supports (MEDICAL COMPRESSION THIGH HIGH) MISC 1 Device by Does not apply route daily. 20-30  compression.  2 each  1  . epoetin alfa (PROCRIT) 78295 UNIT/ML injection Inject 1 mL (10,000 Units total) into the skin 3 (three) times a week.  1 mL    . Febuxostat (ULORIC) 80 MG TABS Take one tablet by mouth every other day.  15 tablet  3  . ferrous sulfate (FERRO-BOB) 325 (65 FE) MG tablet Take 325 mg by mouth daily.        . furosemide (LASIX) 40 MG tablet Take 0.5 tablets (20 mg total) by mouth daily.  45 tablet  1  . glipiZIDE (GLIPIZIDE XL) 10 MG 24 hr tablet Take 1 tablet (10 mg total) by mouth daily.  30 tablet  11  . glucose blood (ONE TOUCH ULTRA TEST) test strip Check sugar 3 x daily  100 each  11  . glucose blood test strip Please give for One touch ultra strips.  Not in computer for selection Use as instructed  100 each  12  . hydrALAZINE (APRESOLINE) 25 MG tablet Take 1 tablet (25 mg total) by mouth 3 (three) times daily.      . hydrocortisone 1 % lotion Apply topically 2 (two) times daily.  99 mL  2  . insulin lispro (HUMALOG) 100 UNIT/ML injection Inject 2 units prior to breakfast, 4 units prior to lunch, and 2 units prior to dinner.  3 mL  12  . Insulin Pen Needle (NOVOFINE) 32G X 6 MM MISC To help draw insulin up 3x daily.  100 each  6  . levothyroxine (SYNTHROID, LEVOTHROID) 25 MCG tablet take 1 tablet by mouth once daily  30 tablet  11  . loratadine (CLARITIN) 10 MG tablet Take 1 tablet (10 mg total) by mouth daily.  30 tablet  5  . metoprolol succinate (TOPROL-XL) 100 MG  24 hr tablet Take 1 tablet (100 mg total) by mouth daily. Take with or immediately following a meal.  30 tablet  3  . Multiple Vitamin (MULTIVITAMIN) tablet Take 1 tablet by mouth daily.        Marland Kitchen NEEDLE, DISP, 25 G (BD DISP NEEDLES) 25G X 5/8" MISC As directed. Check blood sugar at least 3x a day.  100 each  11  . omeprazole (PRILOSEC) 20 MG capsule Take 20 mg by mouth daily.        . polyethylene glycol powder (GLYCOLAX/MIRALAX) powder Take 17 g by mouth 2 (two) times daily. Hold for loose stools  527 g  6  . pravastatin (PRAVACHOL) 40 MG tablet take 1 tablet by mouth once daily  30 tablet  6  . sodium bicarbonate 650 MG tablet take 1 tablet by mouth twice a day  64 tablet  10  . sodium polystyrene (KIONEX) 15 GM/60ML suspension Take on Tuesday and Friday to prevent high potassium.  473 mL  5  . timolol (TIMOPTIC) 0.5 % ophthalmic solution Place 1 drop into both eyes as directed.       . vitamin B-12 (CYANOCOBALAMIN) 100 MCG tablet Take 100 mcg by mouth daily.        Marland Kitchen warfarin (COUMADIN) 5 MG tablet Take 1.5 tablets (7.5 mg total) by mouth daily. TO PREVENT BLOOD CLOTS  45 tablet  11   No current facility-administered medications for this visit.    Allergies  Review of patient's allergies indicates no known allergies.  Electrocardiogram:  NSR first degree block LVH no change from 2013   Assessment and Plan

## 2013-03-26 NOTE — Assessment & Plan Note (Signed)
Continue coumadin HHN check this at home INR been variable due to diet no bleeding issues. He apparently was told at [facility] that coumadin would be life long

## 2013-03-26 NOTE — Patient Instructions (Signed)
Your physician wants you to follow-up in: YEAR WITH DR NISHAN  You will receive a reminder letter in the mail two months in advance. If you don't receive a letter, please call our office to schedule the follow-up appointment.  Your physician recommends that you continue on your current medications as directed. Please refer to the Current Medication list given to you today. 

## 2013-03-26 NOTE — Assessment & Plan Note (Signed)
Well controlled.  Continue current medications and low sodium Dash type diet.    

## 2013-03-29 ENCOUNTER — Other Ambulatory Visit: Payer: Self-pay | Admitting: *Deleted

## 2013-03-29 DIAGNOSIS — M109 Gout, unspecified: Secondary | ICD-10-CM

## 2013-03-29 MED ORDER — FEBUXOSTAT 80 MG PO TABS: ORAL_TABLET | ORAL | Status: DC

## 2013-04-06 ENCOUNTER — Telehealth: Payer: Self-pay | Admitting: Family Medicine

## 2013-04-06 DIAGNOSIS — I808 Phlebitis and thrombophlebitis of other sites: Secondary | ICD-10-CM | POA: Diagnosis not present

## 2013-04-06 NOTE — Telephone Encounter (Signed)
PT - 12.4 INR 1.0  Taking 5mg  per day - let her know if this needs to change

## 2013-04-07 ENCOUNTER — Other Ambulatory Visit: Payer: Self-pay | Admitting: *Deleted

## 2013-04-07 MED ORDER — PRAVASTATIN SODIUM 40 MG PO TABS: 40.0000 mg | ORAL_TABLET | Freq: Every day | ORAL | Status: DC

## 2013-04-07 NOTE — Telephone Encounter (Signed)
Spoke with Cassandra (through TTY) and relayed message about 7.5 mgs everyday except Tuesday and Thursday (in which he takes 5mg ).  She is agreeable to this, but gets upset when I ask her to come into clinic stating "no the home health can do this, I have too much on my plate at this time and cant make it in".  Apologized that she was feeling overwhelmed and that I would get in touch with Dr. Durene Cal and [place] Sink.  Explained to [place] the instructions for coumadin as well.  She will repeat PT and INR tomorrow and relay results and will forward to MD to decide further dosage and next sample time.    Dr. Durene Cal,  FYI his daughter refused to come in even after I advised that this would be the best option. Fleeger, [name]'

## 2013-04-07 NOTE — Telephone Encounter (Signed)
Contacted [place] Sink, she is not sure why the patient did not come to the lab and why he was taking the dosage he was.  Since [place] Sink sounded very confused I told her that I would contact pt and have him schedule an appt her for PT/INR, then we would contact her with instructions.  LM thru TTY for Cassandra (pts daughter) to call back and schedule appt for Lab. Vanessia Bokhari, [name]

## 2013-04-07 NOTE — Telephone Encounter (Signed)
On 3/25, patient instructed "to return to previous schedule of 1 pill (5 mg) only on Tuesday an Thursday with 1.5 pills (7.5 mg) on all other days. Come to clinic for INR check in 1 week then [facility] in 2 weeks." unclear why only taking 1 pill and why did not come in for INR check- did patient and [place] Sink get message?  Will ask nursing to call both [place] Sink to update her as well as TTY to inform patient that we will start this today and have inr check in 1 week with our lab then [place] in 2 weeks. If inr remains difficult to control then may need to send patient back to [facility] to see if other anticoagulants would be an option.

## 2013-04-07 NOTE — Telephone Encounter (Signed)
When patient calls back, please instruct to increase dose to 1.5 of the 5mg  pills everyday except Tuesday and Thursday when he will take just 1 pill.

## 2013-04-08 DIAGNOSIS — I808 Phlebitis and thrombophlebitis of other sites: Secondary | ICD-10-CM | POA: Diagnosis not present

## 2013-04-14 ENCOUNTER — Telehealth: Payer: Self-pay | Admitting: Family Medicine

## 2013-04-14 NOTE — Telephone Encounter (Signed)
Results: PT 23.7 INR - 2.0

## 2013-04-20 ENCOUNTER — Telehealth: Payer: Self-pay | Admitting: *Deleted

## 2013-04-20 DIAGNOSIS — H4011X0 Primary open-angle glaucoma, stage unspecified: Secondary | ICD-10-CM | POA: Diagnosis not present

## 2013-04-20 DIAGNOSIS — I808 Phlebitis and thrombophlebitis of other sites: Secondary | ICD-10-CM | POA: Diagnosis not present

## 2013-04-20 DIAGNOSIS — E119 Type 2 diabetes mellitus without complications: Secondary | ICD-10-CM | POA: Diagnosis not present

## 2013-04-20 DIAGNOSIS — H409 Unspecified glaucoma: Secondary | ICD-10-CM | POA: Diagnosis not present

## 2013-04-20 DIAGNOSIS — Z961 Presence of intraocular lens: Secondary | ICD-10-CM | POA: Diagnosis not present

## 2013-04-20 NOTE — Telephone Encounter (Signed)
[place] Sink with [facility] left message of Physician Line with PT/INR:  Results: INR: 1.0 PT: 12.2  Please call [place] Sink at [phone] or text Dois Davenport (Daughter) at [phone] with Coumadin Dose.    Philip Matthews

## 2013-04-20 NOTE — Telephone Encounter (Signed)
Will ask RN to call [name]  Please ask Philip Matthews to always inform us of the current dose he is taking when he calls. I would like for him to take 1.5 of the 5mg  pills (7.5 mg) and see me in the office at my next available. He should bring all medications. I am concerned that he may not be taking medication properly.

## 2013-04-21 ENCOUNTER — Telehealth: Payer: Self-pay | Admitting: Family Medicine

## 2013-04-21 DIAGNOSIS — D649 Anemia, unspecified: Secondary | ICD-10-CM | POA: Diagnosis not present

## 2013-04-21 DIAGNOSIS — E119 Type 2 diabetes mellitus without complications: Secondary | ICD-10-CM

## 2013-04-21 DIAGNOSIS — N2581 Secondary hyperparathyroidism of renal origin: Secondary | ICD-10-CM | POA: Diagnosis not present

## 2013-04-21 LAB — CBC AND DIFFERENTIAL: HCT: 35 % — AB (ref 41–53)

## 2013-04-21 LAB — BASIC METABOLIC PANEL WITH GFR
BUN: 63 mg/dL — AB (ref 4–21)
Creatinine: 2.1 mg/dL — AB (ref 0.6–1.3)
Glucose: 170 mg/dL
Potassium: 5.6 mmol/L — AB (ref 3.4–5.3)
Sodium: 138 mmol/L (ref 137–147)

## 2013-04-21 MED ORDER — GLUCOSE BLOOD VI STRP: ORAL_STRIP | Status: DC

## 2013-04-21 NOTE — Telephone Encounter (Signed)
Refilled strips. Unclear why patient needs refills as given 11 refills in November.

## 2013-04-21 NOTE — Telephone Encounter (Signed)
Patient has been out of test strips for 2 weeks.  He used [facility] on [address].

## 2013-04-21 NOTE — Telephone Encounter (Signed)
Philip Matthews informed of message.  She will call pts daughter and tell her that pt needs to take 7.5mg s everyday until his appt on Wednesday.  Had [place] repeat it back to me to clarify. Carlson Belland, [name]

## 2013-04-22 ENCOUNTER — Other Ambulatory Visit: Payer: Self-pay | Admitting: *Deleted

## 2013-04-22 DIAGNOSIS — J309 Allergic rhinitis, unspecified: Secondary | ICD-10-CM

## 2013-04-22 MED ORDER — LORATADINE 10 MG PO TABS: 10.0000 mg | ORAL_TABLET | Freq: Every day | ORAL | Status: DC

## 2013-04-23 DIAGNOSIS — L608 Other nail disorders: Secondary | ICD-10-CM | POA: Diagnosis not present

## 2013-04-23 DIAGNOSIS — E1149 Type 2 diabetes mellitus with other diabetic neurological complication: Secondary | ICD-10-CM | POA: Diagnosis not present

## 2013-04-23 DIAGNOSIS — Q828 Other specified congenital malformations of skin: Secondary | ICD-10-CM | POA: Diagnosis not present

## 2013-04-27 DIAGNOSIS — N2581 Secondary hyperparathyroidism of renal origin: Secondary | ICD-10-CM | POA: Diagnosis not present

## 2013-04-27 DIAGNOSIS — I129 Hypertensive chronic kidney disease with stage 1 through stage 4 chronic kidney disease, or unspecified chronic kidney disease: Secondary | ICD-10-CM | POA: Diagnosis not present

## 2013-04-27 DIAGNOSIS — D649 Anemia, unspecified: Secondary | ICD-10-CM | POA: Diagnosis not present

## 2013-04-28 ENCOUNTER — Encounter: Payer: Self-pay | Admitting: Family Medicine

## 2013-04-28 ENCOUNTER — Ambulatory Visit (INDEPENDENT_AMBULATORY_CARE_PROVIDER_SITE_OTHER): Payer: Medicare Other | Admitting: Family Medicine

## 2013-04-28 ENCOUNTER — Ambulatory Visit (INDEPENDENT_AMBULATORY_CARE_PROVIDER_SITE_OTHER): Payer: Medicare Other | Admitting: *Deleted

## 2013-04-28 VITALS — BP 130/70 | HR 72 | Ht 64.0 in | Wt 151.0 lb

## 2013-04-28 DIAGNOSIS — E1149 Type 2 diabetes mellitus with other diabetic neurological complication: Secondary | ICD-10-CM | POA: Diagnosis not present

## 2013-04-28 DIAGNOSIS — Z7901 Long term (current) use of anticoagulants: Secondary | ICD-10-CM

## 2013-04-28 DIAGNOSIS — E1143 Type 2 diabetes mellitus with diabetic autonomic (poly)neuropathy: Secondary | ICD-10-CM

## 2013-04-28 DIAGNOSIS — E782 Mixed hyperlipidemia: Secondary | ICD-10-CM | POA: Diagnosis not present

## 2013-04-28 DIAGNOSIS — I81 Portal vein thrombosis: Secondary | ICD-10-CM

## 2013-04-28 DIAGNOSIS — G909 Disorder of the autonomic nervous system, unspecified: Secondary | ICD-10-CM

## 2013-04-28 DIAGNOSIS — N183 Chronic kidney disease, stage 3 unspecified: Secondary | ICD-10-CM | POA: Diagnosis not present

## 2013-04-28 LAB — POCT GLYCOSYLATED HEMOGLOBIN (HGB A1C): Hemoglobin A1C: 7

## 2013-04-28 LAB — POCT INR: INR: 2.3

## 2013-04-28 MED ORDER — ONETOUCH DELICA LANCETS FINE MISC: 1.0000 | Freq: Three times a day (TID) | Status: DC

## 2013-04-28 MED ORDER — HYDRALAZINE HCL 25 MG PO TABS: 25.0000 mg | ORAL_TABLET | Freq: Three times a day (TID) | ORAL | Status: DC

## 2013-04-28 NOTE — Patient Instructions (Signed)
1. History of blood clots-keep taking coumadin 1.5 pills of the 5mg  tablet daily. Have [place] Sink call us with the results of your next INR in 1 week.   *I am going to try to talk to your blood doctor to see if there is a medication that would be better for you since your numbers are going all over the place.  2. For diabetes, your a1c was at goal of 7, keep taking the same medication. I have called the pharmacy, they said you have lancet and strip prescriptions now. THey said you just got needles for the end of the pen on 4/16 and have refills available if needed. Come back at my next available to discuss the diabetic shoe forms.  3. At your next visit, bring all medications so we can update your list.  4. You can stop taking fish oil.  5. We are getting records from your kidney doctor. I am going to hold off on labs to see if I can get records.   See me at my next available appointment (within the month),  Dr. Durene Cal

## 2013-04-29 NOTE — Assessment & Plan Note (Signed)
Continue Coumadin 7.5 mg for portal vein thrombosis and upper extremity DVT of unknown etiology for now. Every other week checks with home health and patient will follow up with me in 1-2 weeks for repeat as well. I do not see that NOACs have been approved for portal vein thrombosis but I have sent an epic message to Dr. Clelia Croft of hematology to see if he thinks these would be an option for patient given difficult to control INR

## 2013-04-29 NOTE — Assessment & Plan Note (Signed)
Well controlled with a1c of 7.0 today. Continue current medications.

## 2013-04-29 NOTE — Assessment & Plan Note (Signed)
Obtain records from [facility].

## 2013-04-29 NOTE — Assessment & Plan Note (Signed)
Discuss at next visit-previously refused repeat x-ray.

## 2013-04-29 NOTE — Assessment & Plan Note (Signed)
Presumed Stable. Continue pravastatin. Stop fish oil. Obtain records from [facility] and repeat lipids and LFTs if none completed.

## 2013-04-29 NOTE — Assessment & Plan Note (Signed)
Discuss abd u/s for follow up of kidney and liver lesions with patient as last done in 2012.

## 2013-04-29 NOTE — Progress Notes (Signed)
Subjective:   # Portal vein thrombosis and history upper extremity DVT patient was seen by Dr. Clelia Croft of hematology on 01/10/11 for chronic portal vein thrombosis and upper extremity DVT of unknown etiology. He was advised at that time to be on chronic anticoagulation with only prn follow up with hematology. Over last few months, patient's INR has been difficult to control sometimes 1 and sometimes supratherapeutic. Today, INR 2.3 on 7.5 mg coumadin daily. Patient without upper extremity pain or swelling or abdominal pain  # Diabetes Mellitus Patient with CBGs typically <150 in AM. He denies symptoms of hypoglycemia. He has been out of strips for almost 3 weeks and has difficulty with pharmacy filling script and difficult communication due to patient and daughter being deaf. I called pharmacy while patient was in room and made sure that he had strips, lancets, and needles and verified with pharmacy tech for all 3.   # Hyperlipidemia Last LDL <50 in 2012. Patient compliant with pravastatin. No muscle aches/cramps. No chest pain/shortness of breath. Patient would like to reduce medication if possible. No record of triglycerides here.    ROS--See HPI  Past Medical History Patient Active Problem List   Diagnosis Date Noted  . BARRETTS ESOPHAGUS 07/11/2010    Priority: High  . Portal vein thrombosis 06/08/2010    Priority: High  . Type II diabetes mellitus, controlled but with peripheral neuropathy 02/01/2010    Priority: High  . Chronic kidney disease (CKD), stage III (moderate) 02/01/2010    Priority: High  . Isolated systolic HTN, goal SBP <150.  [id number]    Priority: Medium  . Allergic rhinitis 09/08/2012    Priority: Low  . Hyperkalemia 11/11/2012  . Open-angle glaucoma 08/05/2012  . Hypothyroid 06/17/2012  . Lower extremity edema 05/30/2012  . Rash 01/14/2012  . Mixed hyperlipidemia 06/20/2010  . Gout, unspecified 06/07/2010  . LIVER MASS 03/08/2010  . LUNG NODULE 03/08/2010  .  Unspecified disorder of kidney and ureter 03/08/2010  . Anemia in chronic kidney disease 02/01/2010   Reviewed problem list.  Medications- reviewed and updated Chief complaint-noted  Objective: BP 130/70  Pulse 72  Ht 5\' 4"  (1.626 m)  Wt 151 lb (68.493 kg)  BMI 25.91 kg/m2 Gen: NAD, resting comfortably on table. Sign language interpreter and daughter present.  CV: RRR no murmurs rubs or gallops Lungs: CTAB no crackles, wheeze, rhonchi Skin: warm, dry Neuro: grossly normal, moves all extremities Ext: no upper or lower extremity swelling or pain.   Assessment/Plan: Patient will return for diabetic foot exam and filling out of diabetic shoe form.  Labwork-obtaining records from [facility] before drawing blood work on patient. Last LDL here was in 2012-will need fasting labs if none obtained at [facility].  Also will check TSH if none done recently as on levothyroxine. Also CMET as on statin.

## 2013-04-29 NOTE — Assessment & Plan Note (Signed)
Will discuss need for GI follow up at next visit for EGD.

## 2013-05-03 ENCOUNTER — Encounter: Payer: Self-pay | Admitting: Family Medicine

## 2013-05-03 LAB — VITAMIN D 25 HYDROXY (VIT D DEFICIENCY, FRACTURES): Vit D, 25-Hydroxy: 45.3

## 2013-05-03 NOTE — Assessment & Plan Note (Signed)
Cr typically 1.3-1.8 but most recently 2.1 (4/10 1.87 then 4/23 2.1 and as low as 1.3 on 3/11). Bouts of recurrent hyperkalemia on kayexalate 3x weekly.

## 2013-05-04 ENCOUNTER — Other Ambulatory Visit: Payer: Self-pay | Admitting: Family Medicine

## 2013-05-04 DIAGNOSIS — I81 Portal vein thrombosis: Secondary | ICD-10-CM

## 2013-05-04 MED ORDER — METOPROLOL SUCCINATE ER 100 MG PO TB24: 100.0000 mg | ORAL_TABLET | Freq: Every day | ORAL | Status: DC

## 2013-05-04 NOTE — Telephone Encounter (Signed)
PT/INR Values INR 2.6 and PT 31.3

## 2013-05-04 NOTE — Telephone Encounter (Signed)
Can you clarify where you want him to have inr checked?  In office or with home health in 1 week and then again in 2 weeks?  He has an appt to see you 05-28-13.

## 2013-05-04 NOTE — Telephone Encounter (Signed)
Wonderful. Trending up though and patient previously supratherapeutic ont this dose. . Want recheck in 1 week then with [facility] in 2 weeks. Please inform patient or [place] Sink.

## 2013-05-05 NOTE — Telephone Encounter (Signed)
Visit here in 1 week. [facility] in 2 weeks from time of last INR.

## 2013-05-05 NOTE — Telephone Encounter (Signed)
Left message through TTY for daughter to make a lab appt for patient to have his PTINR rechecked in a week per Dr. Durene Cal and then a week after with home health.

## 2013-05-10 ENCOUNTER — Other Ambulatory Visit: Payer: Self-pay | Admitting: Family Medicine

## 2013-05-11 ENCOUNTER — Telehealth: Payer: Self-pay | Admitting: Family Medicine

## 2013-05-11 DIAGNOSIS — E875 Hyperkalemia: Secondary | ICD-10-CM | POA: Diagnosis not present

## 2013-05-11 DIAGNOSIS — D649 Anemia, unspecified: Secondary | ICD-10-CM | POA: Diagnosis not present

## 2013-05-11 NOTE — Telephone Encounter (Signed)
INR 2.4 PT 29 5 mg/day Please call if there needs to be a change

## 2013-05-12 NOTE — Telephone Encounter (Signed)
Spoke to [name],instruction given per Dr Mikel Cella. Roselinda Bahena, Virgel Bouquet

## 2013-05-12 NOTE — Telephone Encounter (Signed)
Continue 5mg /day. Please recheck within one month, if not already scheduled.  Katelee Schupp M. Goddess Gebbia, M.D. 05/12/2013 2:26 PM

## 2013-05-13 ENCOUNTER — Telehealth: Payer: Self-pay | Admitting: Family Medicine

## 2013-05-13 NOTE — Telephone Encounter (Signed)
Message left on phone using interrupter line - resources for medicine management would be in contact to help organize meds. Please bring all vitamins and medicines from ALL providers to your next appointment so that we may go over them. Please call with any further questions. [name], RN-BSN

## 2013-05-13 NOTE — Telephone Encounter (Signed)
Family member brought in list of notes regarding pt meds and med needs. Including from the home health nurse. [facility] called and pt recently had Kionex filled on 4/25 from Dr. Allena Katz (not on our med list), Pravastatin filled on 5/6 ( not time for refill ), Sodium bicarb ( we have on list but not prescribed by our office). Does he need to come in for appointment with all meds to clarify what he is to be on or make appointment with [facility]? Please advise.   I also spoke with the home health nurse French Ana - states that pt is on many , many vitamins in addition to 30+ prescribed meds from mulitple providers . She believes he may need consult with Cindee Lame and definitely an appointment to straighten out meds.  [name], RN-BSN

## 2013-05-13 NOTE — Telephone Encounter (Signed)
Referral made to [employer] for medication management. French Ana with [facility] ( home health nurse ) notified. [name], RN-BSN

## 2013-05-13 NOTE — Telephone Encounter (Signed)
Will

## 2013-05-14 ENCOUNTER — Telehealth: Payer: Self-pay | Admitting: Family Medicine

## 2013-05-14 NOTE — Telephone Encounter (Signed)
Covering Dr. Erasmo Leventhal box today. I agree with Carol's note (05/13/13) that patient needs to bring all medications from all providers to appointment with Dr. Durene Cal to make sure we have his complete medication list. Until then, it is difficult to provide refills as we do not know what all he is on.

## 2013-05-19 ENCOUNTER — Other Ambulatory Visit (HOSPITAL_COMMUNITY): Payer: Self-pay

## 2013-05-20 ENCOUNTER — Encounter (HOSPITAL_COMMUNITY)
Admission: RE | Admit: 2013-05-20 | Discharge: 2013-05-20 | Disposition: A | Discharge status: Home / Self Care | Payer: Medicare Other | Source: Ambulatory Visit | Attending: Nephrology | Admitting: Nephrology

## 2013-05-20 DIAGNOSIS — Z86718 Personal history of other venous thrombosis and embolism: Secondary | ICD-10-CM | POA: Diagnosis not present

## 2013-05-20 DIAGNOSIS — K2270 Barrett's esophagus without dysplasia: Secondary | ICD-10-CM | POA: Insufficient documentation

## 2013-05-20 DIAGNOSIS — N183 Chronic kidney disease, stage 3 unspecified: Secondary | ICD-10-CM | POA: Diagnosis not present

## 2013-05-20 DIAGNOSIS — E1142 Type 2 diabetes mellitus with diabetic polyneuropathy: Secondary | ICD-10-CM | POA: Insufficient documentation

## 2013-05-20 DIAGNOSIS — D631 Anemia in chronic kidney disease: Secondary | ICD-10-CM | POA: Insufficient documentation

## 2013-05-20 DIAGNOSIS — I10 Essential (primary) hypertension: Secondary | ICD-10-CM | POA: Insufficient documentation

## 2013-05-20 DIAGNOSIS — E039 Hypothyroidism, unspecified: Secondary | ICD-10-CM | POA: Insufficient documentation

## 2013-05-20 DIAGNOSIS — E1149 Type 2 diabetes mellitus with other diabetic neurological complication: Secondary | ICD-10-CM | POA: Diagnosis not present

## 2013-05-20 DIAGNOSIS — N039 Chronic nephritic syndrome with unspecified morphologic changes: Secondary | ICD-10-CM | POA: Insufficient documentation

## 2013-05-20 MED ORDER — SODIUM CHLORIDE 0.9 % IV SOLN
1020.0000 mg | Freq: Once | INTRAVENOUS | Status: AC
  Administered 2013-05-20: 1020 mg via INTRAVENOUS
  Filled 2013-05-20: qty 34

## 2013-05-21 ENCOUNTER — Encounter (HOSPITAL_COMMUNITY): Payer: Medicare Other

## 2013-05-25 DIAGNOSIS — D509 Iron deficiency anemia, unspecified: Secondary | ICD-10-CM | POA: Diagnosis not present

## 2013-05-25 DIAGNOSIS — D649 Anemia, unspecified: Secondary | ICD-10-CM | POA: Diagnosis not present

## 2013-05-25 DIAGNOSIS — N183 Chronic kidney disease, stage 3 unspecified: Secondary | ICD-10-CM | POA: Diagnosis not present

## 2013-05-28 ENCOUNTER — Ambulatory Visit (INDEPENDENT_AMBULATORY_CARE_PROVIDER_SITE_OTHER): Payer: Medicare Other | Admitting: Family Medicine

## 2013-05-28 ENCOUNTER — Encounter: Payer: Self-pay | Admitting: Family Medicine

## 2013-05-28 VITALS — BP 142/82 | HR 70 | Temp 97.9°F | Ht 64.0 in | Wt 149.5 lb

## 2013-05-28 DIAGNOSIS — I81 Portal vein thrombosis: Secondary | ICD-10-CM

## 2013-05-28 DIAGNOSIS — E1149 Type 2 diabetes mellitus with other diabetic neurological complication: Secondary | ICD-10-CM | POA: Diagnosis not present

## 2013-05-28 DIAGNOSIS — I10 Essential (primary) hypertension: Secondary | ICD-10-CM | POA: Diagnosis not present

## 2013-05-28 DIAGNOSIS — K2270 Barrett's esophagus without dysplasia: Secondary | ICD-10-CM | POA: Diagnosis not present

## 2013-05-28 DIAGNOSIS — G909 Disorder of the autonomic nervous system, unspecified: Secondary | ICD-10-CM | POA: Diagnosis not present

## 2013-05-28 DIAGNOSIS — N289 Disorder of kidney and ureter, unspecified: Secondary | ICD-10-CM

## 2013-05-28 DIAGNOSIS — E1143 Type 2 diabetes mellitus with diabetic autonomic (poly)neuropathy: Secondary | ICD-10-CM

## 2013-05-28 DIAGNOSIS — N183 Chronic kidney disease, stage 3 unspecified: Secondary | ICD-10-CM | POA: Diagnosis not present

## 2013-05-28 DIAGNOSIS — E039 Hypothyroidism, unspecified: Secondary | ICD-10-CM | POA: Diagnosis not present

## 2013-05-28 MED ORDER — SODIUM BICARBONATE 650 MG PO TABS: 650.0000 mg | ORAL_TABLET | Freq: Two times a day (BID) | ORAL | Status: DC

## 2013-05-28 NOTE — Assessment & Plan Note (Signed)
Appears stable. Will continue on coumadin for now. If creatinine stabilizes in future with GFR >30 then would consider xarelto if have issues again with varying inr levels.

## 2013-05-28 NOTE — Assessment & Plan Note (Signed)
checked 10/16/11. No recommendations per GI in that note for yearly follow up. Given age and other medical problems, did not refer back to GI at this time (05/28/13) for further surveillance.

## 2013-05-28 NOTE — Patient Instructions (Signed)
1. History of blood clots-keep taking coumadin 1.5 pills.   *come back on Monday-Friday of next week to get all your bloodwork drawn. Make a lab visit appointment. We will get other bloodwork as well.  2. We did a diabetic foot exam and I will send in your shoe form.   See you in 3 months,  Dr. Durene Cal

## 2013-05-28 NOTE — Assessment & Plan Note (Signed)
Appears to be worsening. Will recheck today.

## 2013-05-28 NOTE — Assessment & Plan Note (Signed)
On further review. Comments by radiology in 2012 mentioned benign appearance after 1 year and did not suggest further follow up. Will resolve issue.

## 2013-05-28 NOTE — Assessment & Plan Note (Signed)
Very low dose hypothyroidism in patient with TSH mildly elevated that did not have t4 testing that i can tell. Will check TSH, I have low threshold to stop medication. Swelling was likely due to worsening CKD. And fatigue may have been related to anemia.

## 2013-05-28 NOTE — Progress Notes (Signed)
Subjective:  ASL interpreter present. Daughter of patient also present.   # Portal vein thrombosis and history upper extremity DVT patient was seen by Dr. Clelia Croft of hematology on 01/10/11 for chronic portal vein thrombosis and upper extremity DVT of unknown etiology. He was advised at that time to be on chronic anticoagulation with only prn follow up with hematology.   Over last few months (with exception of last month), patient's INR has been difficult to control sometimes 1 and sometimes supratherapeutic. Patient n 7.5 mg coumadin daily. He reports he had an INR last week that was 2.4 and that he had it drawn this week and believes it was within 2-3 as well. Patient without upper extremity pain or swelling or abdominal pain.   Discussed with patient that I had contacted Dr. Clelia Croft about changing to Xarelto before I knew his Creatinine had increased to over 2 (entered as abstract from renal records) which with his weight/age would place his CKD at stage IV. Discussed with our pharmacist Dr. Raymondo Band and decision was made until creatinine stabilizes, may need to avoid Xarelto.   #Hypothyroidism No fatigue reported. On very low dose levothyroixine at 25 mcg. No changes in hair or nails or hot or cold intolerance.   We also completed a comprehensive medication review and updated medication list to include OTC for daughter to help her know when to buy or when to request refill.   #CKD Stage III, ? Progressing to IV with recurrent hyperkalemia Followed by [facility] with recent worsening in creatine. Intolerate to ace-i or arb due to hyperkalemia which is recurrent. No decreased urinary output. No altered mental status.   ROS--See HPI  Past Medical History Patient Active Problem List   Diagnosis Date Noted  . Hyperkalemia 11/11/2012    Priority: High  . BARRETTS ESOPHAGUS 07/11/2010    Priority: High  . Portal vein thrombosis 06/08/2010    Priority: High  . Type II diabetes mellitus,  controlled but with peripheral neuropathy 02/01/2010    Priority: High  . Chronic kidney disease (CKD), stage III (moderate) 02/01/2010    Priority: High  . Hypothyroid 06/17/2012    Priority: Medium  . Mixed hyperlipidemia 06/20/2010    Priority: Medium  . LIVER MASS 03/08/2010    Priority: Medium  . Isolated systolic HTN, goal SBP <150.  16/09/9603    Priority: Medium  . Allergic rhinitis 09/08/2012    Priority: Low  . Open-angle glaucoma 08/05/2012    Priority: Low  . Lower extremity edema 05/30/2012    Priority: Low  . Gout, unspecified 06/07/2010    Priority: Low  . Anemia in chronic kidney disease(285.21) 02/01/2010    Priority: Low  . Rash 01/14/2012  . LUNG NODULE 03/08/2010  . Unspecified disorder of kidney and ureter 03/08/2010   Reviewed problem list.  Medications- reviewed and updated Chief complaint-noted  Objective: BP 142/82  Pulse 70  Temp(Src) 97.9 F (36.6 C) (Oral)  Ht 5\' 4"  (1.626 m)  Wt 149 lb 8 oz (67.813 kg)  BMI 25.65 kg/m2 Gen: NAD, resting comfortably on table. Sign language interpreter and daughter present.  Skin: warm, dry Neuro: grossly normal (see exam below), moves all extremities Ext: no upper or lower extremity swelling or pain on palpation Diabetic foot exam-1+ DP and PT pulses. Difficult exam with monofilament-not clear if patient understood instructions. Had exam at [facility] 2 months ago and reportedly with only decreased sensation on bottom of foot. On my exam today, patient stated he could  not feel monofilament on any part of his body (face, hands, feet, legs, chest, arms). Intact sensation to light touch and pin prick though. Bilateral callus at base of 5th toes.   Assessment/Plan:  Discuss follow up for lung nodule by x-ray at next visit.

## 2013-05-31 ENCOUNTER — Telehealth: Payer: Self-pay | Admitting: Family Medicine

## 2013-05-31 NOTE — Telephone Encounter (Signed)
[facility] is calling for Recertification orders to continue bi weekly visits.  Verbal ok is ok.

## 2013-05-31 NOTE — Telephone Encounter (Signed)
Verbal orders are given.  Llewellyn Choplin, Darlyne Russian, CMA

## 2013-06-01 ENCOUNTER — Other Ambulatory Visit: Payer: Self-pay | Admitting: Family Medicine

## 2013-06-01 ENCOUNTER — Ambulatory Visit (INDEPENDENT_AMBULATORY_CARE_PROVIDER_SITE_OTHER): Payer: Medicare Other | Admitting: *Deleted

## 2013-06-01 DIAGNOSIS — I81 Portal vein thrombosis: Secondary | ICD-10-CM | POA: Diagnosis not present

## 2013-06-01 DIAGNOSIS — I10 Essential (primary) hypertension: Secondary | ICD-10-CM | POA: Diagnosis not present

## 2013-06-01 DIAGNOSIS — E1143 Type 2 diabetes mellitus with diabetic autonomic (poly)neuropathy: Secondary | ICD-10-CM

## 2013-06-01 DIAGNOSIS — N183 Chronic kidney disease, stage 3 unspecified: Secondary | ICD-10-CM

## 2013-06-01 DIAGNOSIS — E039 Hypothyroidism, unspecified: Secondary | ICD-10-CM | POA: Diagnosis not present

## 2013-06-01 DIAGNOSIS — Z7901 Long term (current) use of anticoagulants: Secondary | ICD-10-CM | POA: Diagnosis not present

## 2013-06-01 DIAGNOSIS — G909 Disorder of the autonomic nervous system, unspecified: Secondary | ICD-10-CM | POA: Diagnosis not present

## 2013-06-01 DIAGNOSIS — E1149 Type 2 diabetes mellitus with other diabetic neurological complication: Secondary | ICD-10-CM

## 2013-06-01 LAB — COMPREHENSIVE METABOLIC PANEL
ALT: 30 U/L (ref 0–53)
AST: 31 U/L (ref 0–37)
Calcium: 9 mg/dL (ref 8.4–10.5)
Chloride: 106 mEq/L (ref 96–112)
Creat: 1.66 mg/dL — ABNORMAL HIGH (ref 0.50–1.35)

## 2013-06-01 LAB — POCT INR: INR: 2.2

## 2013-06-01 LAB — LIPID PANEL
Total CHOL/HDL Ratio: 2.4 Ratio
VLDL: 18 mg/dL (ref 0–40)

## 2013-06-01 LAB — TSH: TSH: 6.872 u[IU]/mL — ABNORMAL HIGH (ref 0.350–4.500)

## 2013-06-01 IMAGING — CR DG FOOT COMPLETE 3+V*L*
2 series · 2 of 2 positions shown · non-contrast
Comparison: 06/07/2010

CLINICAL DATA: Twisting injury, pain

LEFT FOOT - COMPLETE 3+ VIEW

[view not recorded (1 of 2)]
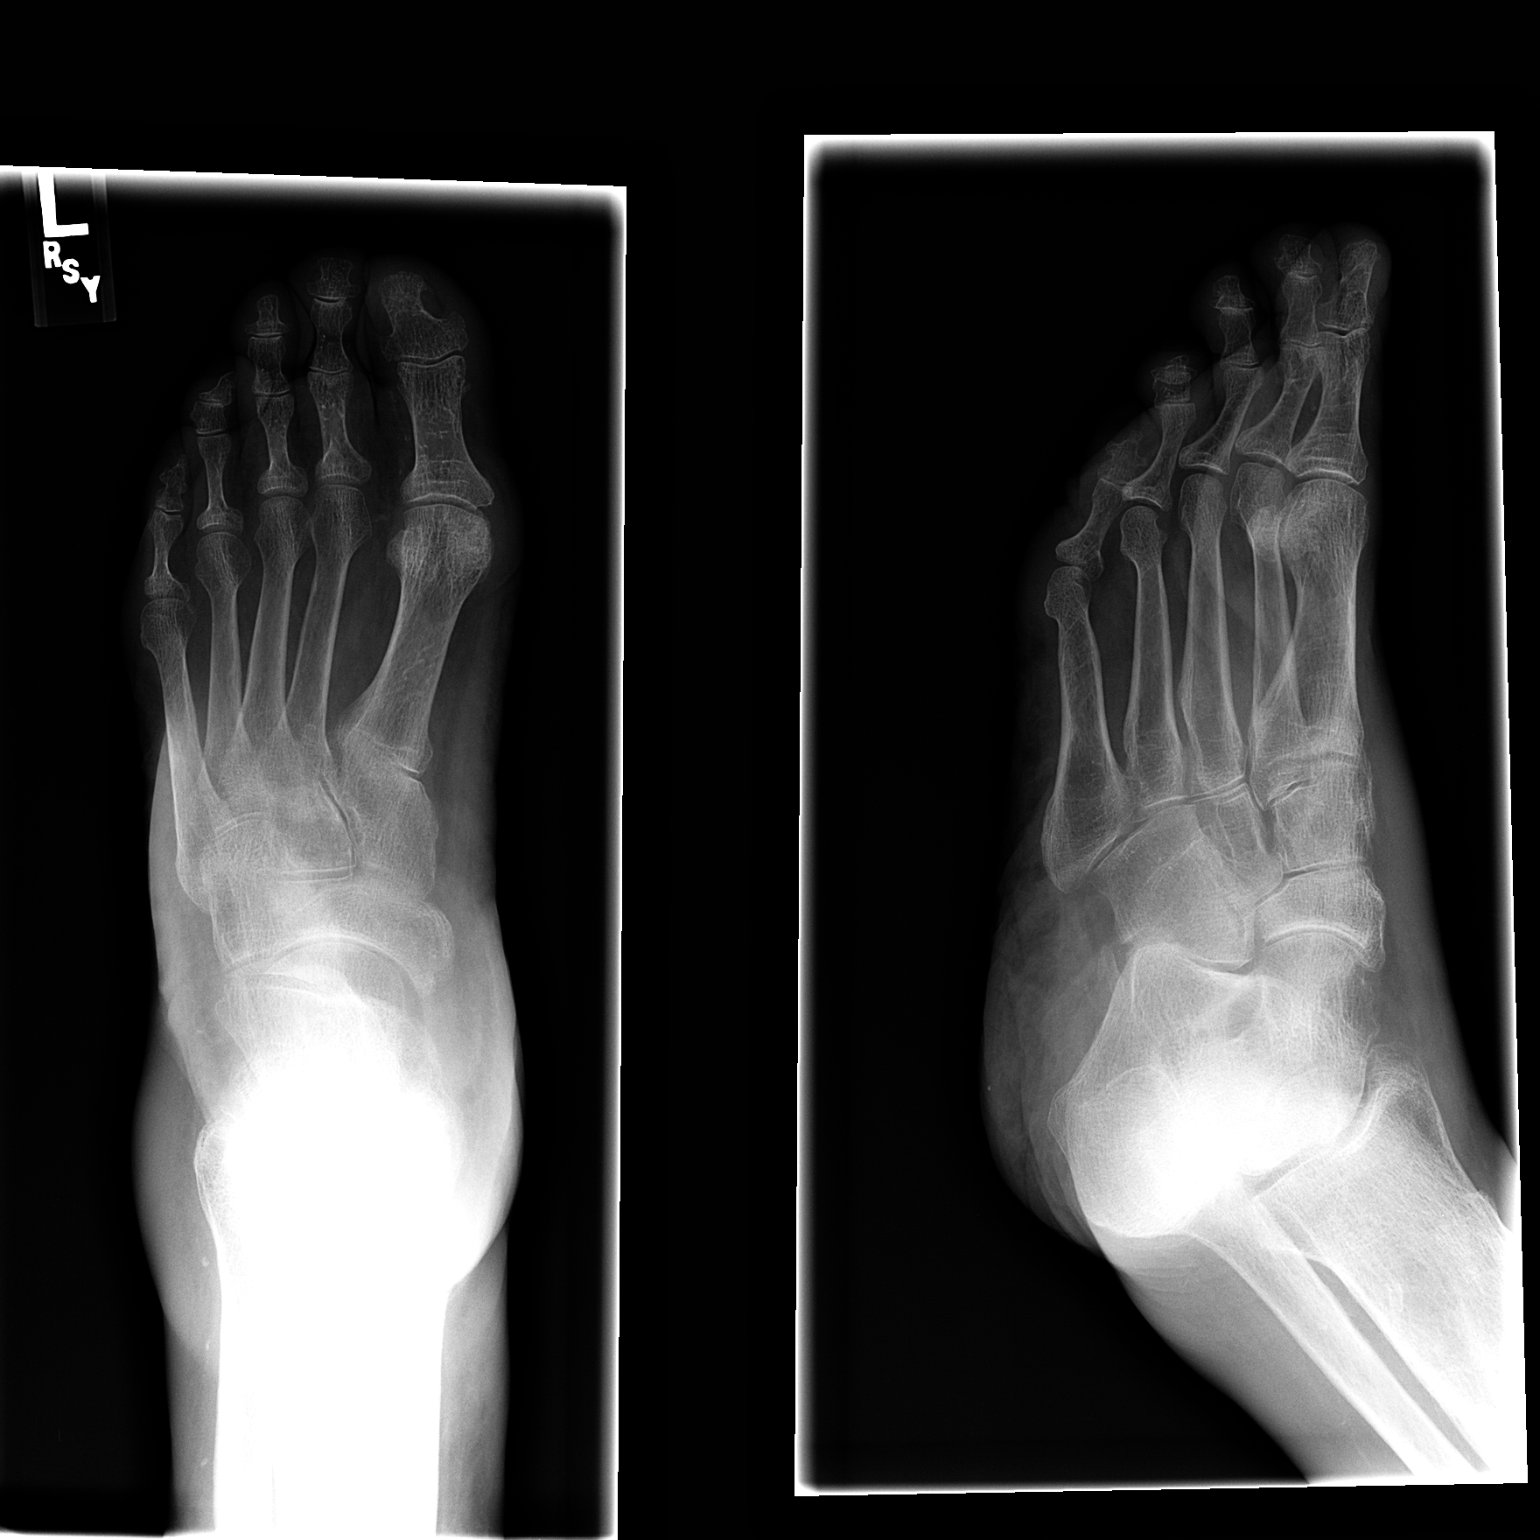

[view not recorded (2 of 2)]
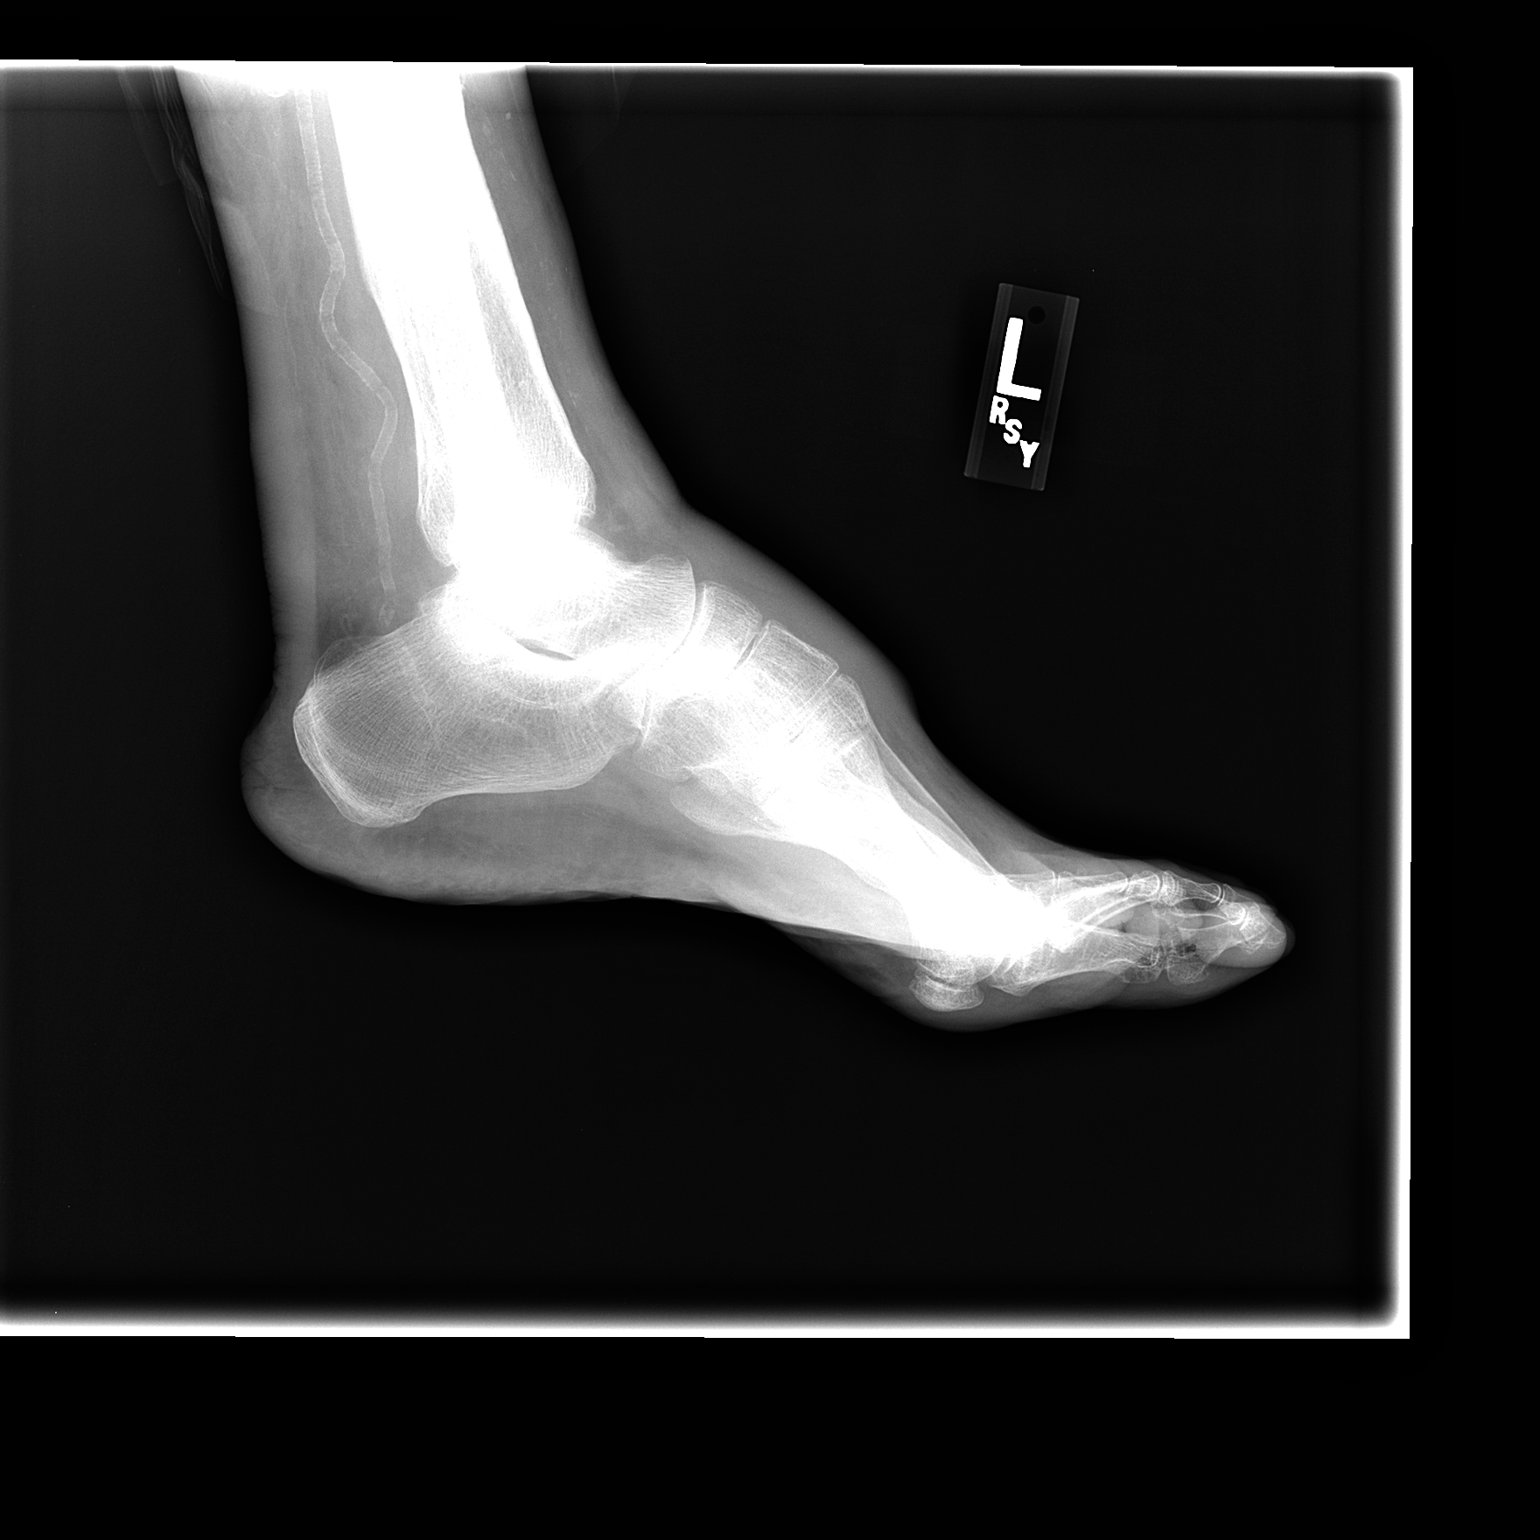

[2 of 2 positions shown; findings below may reference images not displayed]

FINDINGS: Healed fracture deformity of the left distal tibia.
Bones appear osteopenic.  Preserved alignment.  No displaced
fracture evident.  Mild soft tissue swelling dorsally.  No
radiopaque foreign body. Peripheral vascular calcifications noted.
IMPRESSION: Soft tissue swelling.
Osteopenia
Healed left distal tibia fracture

## 2013-06-15 ENCOUNTER — Encounter: Payer: Self-pay | Admitting: Family Medicine

## 2013-06-15 DIAGNOSIS — E119 Type 2 diabetes mellitus without complications: Secondary | ICD-10-CM | POA: Diagnosis not present

## 2013-06-15 DIAGNOSIS — I808 Phlebitis and thrombophlebitis of other sites: Secondary | ICD-10-CM | POA: Diagnosis not present

## 2013-06-16 ENCOUNTER — Telehealth: Payer: Self-pay | Admitting: Family Medicine

## 2013-06-16 NOTE — Telephone Encounter (Signed)
Dr. Durene Cal returned call with following instructions. Skip one dose of coumadin and resume at next dose with 5mg  ( one tablet ) have blood checked again on Friday and Monday with home health or at clinic - If bleeding go directly to ED. [name], RN-BSN

## 2013-06-16 NOTE — Telephone Encounter (Signed)
Pt called - no answer - message left with answering service - detailing directions. And to call clininc with questions.  [name], RN-BSN

## 2013-06-16 NOTE — Telephone Encounter (Signed)
Left message for "[place] Sink" to return call regarding critical pt/inr Philip Haste, RN-BSN

## 2013-06-16 NOTE — Telephone Encounter (Signed)
Sent page regarding pt to Dr. Durene Cal with critical lab results. [name], RN-BSN

## 2013-06-16 NOTE — Telephone Encounter (Signed)
Critical PT/INR - INR - 6.95 PT - 55.5

## 2013-06-16 NOTE — Telephone Encounter (Signed)
[name] with home health called and given directions for Coumadin as Dr. Durene Cal instructed. [name], RN-BSN

## 2013-06-16 NOTE — Telephone Encounter (Signed)
Dr,Hunter paged again regarding critical labs through amion. [name], RN-BSN

## 2013-06-22 NOTE — Telephone Encounter (Signed)
Due to INR being 1.3 on 5mg  daily, will increase dose to 1.5 pills (7.5 mg every other day with first dose of 7.5 starting today). Patient can follow up on Friday in clinic for repeat or have [facility] draw. Will ask nursing staff to inform family/[name] of [facility].   Hopeful patient's INR will stay stable and we can transition to Xarelto at next visit.

## 2013-06-22 NOTE — Telephone Encounter (Signed)
Philip Matthews with directions to have [name] blood drawn here at clinic this afternoon or in the morning. [name] states " I just looked at my papers and realized I told you wrong his PT 15.2 & INR 1.3" [name], RN-BSN

## 2013-06-22 NOTE — Telephone Encounter (Signed)
[place] Sink called back and states that pt would not allow blood to be drawn on Friday but she did draw PT/INR yesterday.   PT 1.4 and INR 15.4  - dr. Durene Cal paged with results. [name], RN-BSN

## 2013-06-22 NOTE — Telephone Encounter (Signed)
Dr. Durene Cal returned page and asked for pt to come to clinic to have blood drawn this afternoon or first thing in the morning.

## 2013-06-22 NOTE — Telephone Encounter (Signed)
Called pt left message regarding instructions to increase dose every other day to 7.5 mg with repeat blood draw at clinic on Friday - call with questions. [name], RN-BSN

## 2013-06-22 NOTE — Telephone Encounter (Signed)
Message left for pt to have blood drawn this afternoon or first thing in the morning/if any bleeding go straight to ED. [name], RN-BSN

## 2013-06-25 ENCOUNTER — Ambulatory Visit (INDEPENDENT_AMBULATORY_CARE_PROVIDER_SITE_OTHER): Payer: Medicare Other | Admitting: *Deleted

## 2013-06-25 DIAGNOSIS — I81 Portal vein thrombosis: Secondary | ICD-10-CM

## 2013-06-25 DIAGNOSIS — Z7901 Long term (current) use of anticoagulants: Secondary | ICD-10-CM | POA: Diagnosis not present

## 2013-06-25 LAB — POCT INR: INR: 1.6

## 2013-06-29 ENCOUNTER — Telehealth: Payer: Self-pay | Admitting: *Deleted

## 2013-06-29 NOTE — Telephone Encounter (Signed)
PT 33.1 and INR 2.8 - called in by [place] Sink - pt is to continue therapy as currently taking - Kendal Hymen in lab called and informed of today's result as well. [place] Sink verbalized understanding. [name], RN-BSN

## 2013-07-13 ENCOUNTER — Telehealth: Payer: Self-pay | Admitting: Family Medicine

## 2013-07-13 NOTE — Telephone Encounter (Signed)
[place] Sink called and explained MWF 7.5 mg and 5 mg on the other days of the week. Per Kendal Hymen pt will take 5 mg only tomorrow and then  start TTSS with 5 mg/7.5 MWF . Verbalized understanding and repeated care back to me. [name], RN-BSN

## 2013-07-13 NOTE — Telephone Encounter (Signed)
Agree with dosing of MWF 7.5 and 5mg  on 4 other days of the week. Thank you Audley Hose for caring for coordinating patient care.

## 2013-07-13 NOTE — Telephone Encounter (Addendum)
[place] Sink from home care called regarding pt latest test results. Kendal Hymen in lab informed of results.  She recommends m,w, f - 7.5/ T, TH, SAT, SUN 5 mg - will that work?   inr 3.5/pt 41.5

## 2013-07-14 ENCOUNTER — Telehealth: Payer: Self-pay | Admitting: Family Medicine

## 2013-07-14 DIAGNOSIS — E875 Hyperkalemia: Secondary | ICD-10-CM

## 2013-07-14 DIAGNOSIS — I81 Portal vein thrombosis: Secondary | ICD-10-CM

## 2013-07-14 DIAGNOSIS — K5900 Constipation, unspecified: Secondary | ICD-10-CM

## 2013-07-14 DIAGNOSIS — J309 Allergic rhinitis, unspecified: Secondary | ICD-10-CM

## 2013-07-14 DIAGNOSIS — E785 Hyperlipidemia, unspecified: Secondary | ICD-10-CM

## 2013-07-14 DIAGNOSIS — E039 Hypothyroidism, unspecified: Secondary | ICD-10-CM

## 2013-07-14 DIAGNOSIS — I10 Essential (primary) hypertension: Secondary | ICD-10-CM

## 2013-07-14 DIAGNOSIS — E119 Type 2 diabetes mellitus without complications: Secondary | ICD-10-CM

## 2013-07-14 DIAGNOSIS — M109 Gout, unspecified: Secondary | ICD-10-CM

## 2013-07-14 DIAGNOSIS — E1143 Type 2 diabetes mellitus with diabetic autonomic (poly)neuropathy: Secondary | ICD-10-CM

## 2013-07-14 NOTE — Telephone Encounter (Signed)
Patient would like to be contacted once this has been done. [phone] (video phone) & leave message.

## 2013-07-14 NOTE — Telephone Encounter (Signed)
Patient would like a list of his medications sent to [facility] order rx @. [phone] option 3.

## 2013-07-15 NOTE — Telephone Encounter (Signed)
Will fwd to MD to approve.  I am not sure if patient's med list is updated.  Eurika Sandy, Darlyne Russian, CMA

## 2013-07-16 MED ORDER — FEBUXOSTAT 80 MG PO TABS: ORAL_TABLET | ORAL | Status: DC

## 2013-07-16 MED ORDER — METOPROLOL SUCCINATE ER 100 MG PO TB24: 100.0000 mg | ORAL_TABLET | Freq: Every day | ORAL | Status: DC

## 2013-07-16 MED ORDER — POLYETHYLENE GLYCOL 3350 17 GM/SCOOP PO POWD: 17.0000 g | Freq: Two times a day (BID) | ORAL | Status: DC

## 2013-07-16 MED ORDER — SODIUM POLYSTYRENE SULFONATE 15 GM/60ML PO SUSP: 15.0000 g | Freq: Once | ORAL | Status: DC

## 2013-07-16 MED ORDER — HYDRALAZINE HCL 25 MG PO TABS: 25.0000 mg | ORAL_TABLET | Freq: Three times a day (TID) | ORAL | Status: DC

## 2013-07-16 MED ORDER — GLIPIZIDE ER 10 MG PO TB24: 10.0000 mg | ORAL_TABLET | Freq: Every day | ORAL | Status: DC

## 2013-07-16 MED ORDER — INSULIN LISPRO 100 UNIT/ML ~~LOC~~ SOLN: SUBCUTANEOUS | Status: DC

## 2013-07-16 MED ORDER — LORATADINE 10 MG PO TABS: 10.0000 mg | ORAL_TABLET | Freq: Every day | ORAL | Status: DC

## 2013-07-16 MED ORDER — CALCITRIOL 0.25 MCG PO CAPS: 0.2500 ug | ORAL_CAPSULE | ORAL | Status: DC

## 2013-07-16 MED ORDER — WARFARIN SODIUM 5 MG PO TABS: 5.0000 mg | ORAL_TABLET | Freq: Every day | ORAL | Status: DC

## 2013-07-16 MED ORDER — AMLODIPINE BESYLATE 10 MG PO TABS
5.0000 mg | ORAL_TABLET | Freq: Every day | ORAL | Status: DC
Start: 2013-07-16 — End: 2014-08-24

## 2013-07-16 MED ORDER — LEVOTHYROXINE SODIUM 25 MCG PO TABS: 25.0000 ug | ORAL_TABLET | Freq: Every day | ORAL | Status: DC

## 2013-07-16 MED ORDER — CLONIDINE HCL 0.2 MG/24HR TD PTWK: 1.0000 | MEDICATED_PATCH | TRANSDERMAL | Status: DC

## 2013-07-16 MED ORDER — PRAVASTATIN SODIUM 40 MG PO TABS: 40.0000 mg | ORAL_TABLET | Freq: Every day | ORAL | Status: DC

## 2013-07-16 MED ORDER — SODIUM BICARBONATE 650 MG PO TABS: 650.0000 mg | ORAL_TABLET | Freq: Two times a day (BID) | ORAL | Status: AC

## 2013-07-16 NOTE — Telephone Encounter (Addendum)
Will place order today. As this is an electronic option, unclear how to order #3.   Would ask that staff let patient know that many of his medicines are OTC (we tried to write this on his last AVS). They will need to match medicines they get through pharmacy with last AVS.   Thanks, Dr. Durene Cal

## 2013-07-16 NOTE — Telephone Encounter (Signed)
Updated med list faxed to [facility] per patient request and MD approval of updated med list.  LMOVM telling patient of this.  Jayanth Szczesniak, Darlyne Russian, CMA

## 2013-07-21 DIAGNOSIS — E1149 Type 2 diabetes mellitus with other diabetic neurological complication: Secondary | ICD-10-CM | POA: Diagnosis not present

## 2013-07-21 DIAGNOSIS — L608 Other nail disorders: Secondary | ICD-10-CM | POA: Diagnosis not present

## 2013-07-21 DIAGNOSIS — Q828 Other specified congenital malformations of skin: Secondary | ICD-10-CM | POA: Diagnosis not present

## 2013-07-21 MED ORDER — WARFARIN SODIUM 5 MG PO TABS: 5.0000 mg | ORAL_TABLET | Freq: Every day | ORAL | Status: DC

## 2013-07-21 NOTE — Addendum Note (Signed)
Addended by: Shelva Majestic on: 07/21/2013 12:31 PM   Modules accepted: Orders

## 2013-07-27 ENCOUNTER — Telehealth: Payer: Self-pay | Admitting: Family Medicine

## 2013-07-27 ENCOUNTER — Other Ambulatory Visit: Payer: Self-pay | Admitting: *Deleted

## 2013-07-27 DIAGNOSIS — M109 Gout, unspecified: Secondary | ICD-10-CM

## 2013-07-27 DIAGNOSIS — I808 Phlebitis and thrombophlebitis of other sites: Secondary | ICD-10-CM | POA: Diagnosis not present

## 2013-07-27 LAB — BASIC METABOLIC PANEL
Potassium: 5.6 mmol/L — AB (ref 3.4–5.3)
Sodium: 138 mmol/L (ref 137–147)

## 2013-07-27 MED ORDER — FEBUXOSTAT 80 MG PO TABS: ORAL_TABLET | ORAL | Status: DC

## 2013-07-27 NOTE — Telephone Encounter (Signed)
Philip Matthews, with [facility] called in lab results. PT 96 and INR  8.  She did a fingerstick twice and drew a lab venipuncture for confirmation.  Pt has been taking 7.5mg  M,W,F and 5mg  Tu,Th,Sa,Sun.  Results given to Bonnie [place] and Dr. Durene Cal.  Per Dr. Durene Cal, Vitamin 2.5 mg po times once called into [facility] ([phone]).  Pt to have Vitamin K today,hold dose x 2days and recheck PT/INR on Friday.  Verlie Hellenbrand, Darlyne Russian, CMA

## 2013-07-27 NOTE — Telephone Encounter (Signed)
Just want to confirm patient will restart his coumadin at 5mg  only after stopping for 2 days. Thanks.

## 2013-07-27 NOTE — Telephone Encounter (Signed)
[place] Sink called to talk with Lanora Manis and give his results to them she left a message on Elizabeth's extension. PP 96.0 and INR 8   JW

## 2013-07-29 ENCOUNTER — Other Ambulatory Visit: Payer: Self-pay | Admitting: *Deleted

## 2013-07-29 DIAGNOSIS — I10 Essential (primary) hypertension: Secondary | ICD-10-CM

## 2013-07-29 MED ORDER — HYDRALAZINE HCL 25 MG PO TABS: 25.0000 mg | ORAL_TABLET | Freq: Three times a day (TID) | ORAL | Status: DC

## 2013-07-29 NOTE — Telephone Encounter (Signed)
Spoke with [place], [facility].  Confirmed that patient did not have Coumadin on 07/27/13 and 07/28/13.  Pt will start Coumadin 5 mg today, 07/29/13 and PT will be checked tomorrow, 07/30/13 per MD orders.  [name],RN verbalized understanding.  Virgil Slinger, Darlyne Russian, CMA

## 2013-07-29 NOTE — Telephone Encounter (Signed)
kionex is kayexalate. It is prescribed by renal as is lasix so i did not prescribe.

## 2013-07-29 NOTE — Telephone Encounter (Signed)
Requesting refill on Kionex - not on MAR please advise - [name], RN-BSN

## 2013-07-30 ENCOUNTER — Telehealth: Payer: Self-pay | Admitting: *Deleted

## 2013-07-30 NOTE — Telephone Encounter (Signed)
[place] Sink, with [facility] [phone]) called with results: PT:  27.0 INR:  2.3  Pt is taking Coumadin 5 mg a day.  Results given to [facility] in the lab.  Instructions given to [facility] for pt to continue the same dosing and we will call [place] Sink on Monday to let her know when to recheck his PT/INR.  [place] Sink verbalized understanding.   Manoah Deckard, Darlyne Russian, CMA

## 2013-08-01 NOTE — Telephone Encounter (Signed)
Agree. Follow up pt/inr in 1 week. In order to avoid high PT/INR we will be less aggressive in future in getting INR/therapeutic and will tolerate lower levels.

## 2013-08-02 ENCOUNTER — Encounter: Payer: Self-pay | Admitting: Family Medicine

## 2013-08-10 ENCOUNTER — Telehealth: Payer: Self-pay | Admitting: Family Medicine

## 2013-08-10 NOTE — Telephone Encounter (Signed)
Pt/INR readings given to Kendal Hymen - no changes needed - [place] Sink called and informed - recheck in 2 weeks. [name], RN-BSN

## 2013-08-10 NOTE — Telephone Encounter (Signed)
 Sink called with Philip Matthews readings  IMR 2.5  Pt 29.8  [name]'s number [phone] JW

## 2013-08-24 ENCOUNTER — Telehealth: Payer: Self-pay | Admitting: *Deleted

## 2013-08-24 DIAGNOSIS — I129 Hypertensive chronic kidney disease with stage 1 through stage 4 chronic kidney disease, or unspecified chronic kidney disease: Secondary | ICD-10-CM | POA: Diagnosis not present

## 2013-08-24 DIAGNOSIS — E119 Type 2 diabetes mellitus without complications: Secondary | ICD-10-CM | POA: Diagnosis not present

## 2013-08-24 DIAGNOSIS — M329 Systemic lupus erythematosus, unspecified: Secondary | ICD-10-CM | POA: Diagnosis not present

## 2013-08-24 DIAGNOSIS — I808 Phlebitis and thrombophlebitis of other sites: Secondary | ICD-10-CM | POA: Diagnosis not present

## 2013-08-24 NOTE — Telephone Encounter (Signed)
[place] Sink calling with PT 31.3/ INR 2.6. Kendal Hymen informed - continue to stay with 5 mg a day - [place] Sink called back and informed. [name], RN-BSN

## 2013-09-07 ENCOUNTER — Telehealth: Payer: Self-pay | Admitting: *Deleted

## 2013-09-07 NOTE — Telephone Encounter (Signed)
Correction from previous note PT 44.2 and INR 3.7  Spoke with [place] Sink. Asked her to have patient hold dose this evening and recheck in 1 week. Patient with very wide variations in PT/INR and 5mg  has seemed to be a reasonable dose so do not want to decrease from current dosing long term.

## 2013-09-07 NOTE — Telephone Encounter (Signed)
[facility] calling with patient's INR results---Pt: 3.7 and INR: 44.2.  Patient is currently taking 5 mg daily and no diet changes per patient's daughter.  Call Coumadin dosage to [place] at [phone] and she will inform patient's daughter.  Will route results to Dr. Durene Cal & Bonnie [place], [place] for dosing. Gaylene Brooks, RN

## 2013-09-13 DIAGNOSIS — H409 Unspecified glaucoma: Secondary | ICD-10-CM | POA: Diagnosis not present

## 2013-09-13 DIAGNOSIS — H4011X0 Primary open-angle glaucoma, stage unspecified: Secondary | ICD-10-CM | POA: Diagnosis not present

## 2013-09-14 ENCOUNTER — Telehealth: Payer: Self-pay | Admitting: Family Medicine

## 2013-09-14 NOTE — Telephone Encounter (Signed)
Philip Matthews, nurse from [facility], calling with lab result   PT 28.2 / INR 2.4.  Advised her to continue Warfarin 5 mg a day and recheck 1 week  Philip Matthews, MLS (ASCP)cm

## 2013-09-15 DIAGNOSIS — N2581 Secondary hyperparathyroidism of renal origin: Secondary | ICD-10-CM | POA: Diagnosis not present

## 2013-09-15 DIAGNOSIS — D631 Anemia in chronic kidney disease: Secondary | ICD-10-CM | POA: Diagnosis not present

## 2013-09-15 DIAGNOSIS — I129 Hypertensive chronic kidney disease with stage 1 through stage 4 chronic kidney disease, or unspecified chronic kidney disease: Secondary | ICD-10-CM | POA: Diagnosis not present

## 2013-10-05 ENCOUNTER — Telehealth: Payer: Self-pay | Admitting: *Deleted

## 2013-10-05 DIAGNOSIS — E119 Type 2 diabetes mellitus without complications: Secondary | ICD-10-CM | POA: Diagnosis not present

## 2013-10-05 NOTE — Telephone Encounter (Signed)
Great! Please continue 5mg  coumadin daily. Recheck weekly given wide variation in INRs in past.

## 2013-10-05 NOTE — Telephone Encounter (Signed)
[name] called from [facility].  Pts results are as follows:  PT:29.4 INR: 2.4  Advised we would call [name] back and let her know if we will change medications and when she is to go back out. Philip Matthews, Philip Matthews

## 2013-10-06 NOTE — Telephone Encounter (Signed)
[place] Sink informed and agreeable. Haniyah Maciolek, [name]

## 2013-10-12 ENCOUNTER — Telehealth: Payer: Self-pay | Admitting: Family Medicine

## 2013-10-12 NOTE — Telephone Encounter (Signed)
[place] Sink  [facility] nurse called with PT/INR result:  PT 28.3 sec  INR 2.4  Advised [name] to continue patient's warfarin at 5 mg daily and recheck 1 week.  Dewitt Hoes, MLS

## 2013-10-19 ENCOUNTER — Telehealth: Payer: Self-pay | Admitting: Family Medicine

## 2013-10-19 DIAGNOSIS — D631 Anemia in chronic kidney disease: Secondary | ICD-10-CM | POA: Diagnosis not present

## 2013-10-19 DIAGNOSIS — I808 Phlebitis and thrombophlebitis of other sites: Secondary | ICD-10-CM | POA: Diagnosis not present

## 2013-10-19 NOTE — Telephone Encounter (Signed)
10-19-13 Philip Matthews, [facility] nurse, called with protime/INR result:  INR 2.9,  Seconds 34.4   Pt to continue taking 5 mg warfarin daily and recheck 1 week;   Dewitt Hoes, MLS

## 2013-10-20 ENCOUNTER — Ambulatory Visit (INDEPENDENT_AMBULATORY_CARE_PROVIDER_SITE_OTHER): Payer: Medicare Other | Admitting: Podiatrist

## 2013-10-20 ENCOUNTER — Telehealth: Payer: Self-pay | Admitting: Family Medicine

## 2013-10-20 ENCOUNTER — Encounter: Payer: Self-pay | Admitting: Podiatrist

## 2013-10-20 VITALS — Ht 61.0 in | Wt 150.0 lb

## 2013-10-20 DIAGNOSIS — E1149 Type 2 diabetes mellitus with other diabetic neurological complication: Secondary | ICD-10-CM

## 2013-10-20 DIAGNOSIS — B351 Tinea unguium: Secondary | ICD-10-CM | POA: Diagnosis not present

## 2013-10-20 DIAGNOSIS — Q828 Other specified congenital malformations of skin: Secondary | ICD-10-CM

## 2013-10-20 DIAGNOSIS — M79609 Pain in unspecified limb: Secondary | ICD-10-CM

## 2013-10-20 DIAGNOSIS — M216X9 Other acquired deformities of unspecified foot: Secondary | ICD-10-CM

## 2013-10-20 DIAGNOSIS — E119 Type 2 diabetes mellitus without complications: Secondary | ICD-10-CM

## 2013-10-20 DIAGNOSIS — J309 Allergic rhinitis, unspecified: Secondary | ICD-10-CM

## 2013-10-20 MED ORDER — LORATADINE 10 MG PO TABS: 10.0000 mg | ORAL_TABLET | Freq: Every day | ORAL | Status: DC

## 2013-10-20 MED ORDER — INSULIN LISPRO 100 UNIT/ML ~~LOC~~ SOLN: SUBCUTANEOUS | Status: DC

## 2013-10-20 MED ORDER — GLUCOSE BLOOD VI STRP: ORAL_STRIP | Status: DC

## 2013-10-20 NOTE — Telephone Encounter (Signed)
Regarding PT/INR. Continue 5mg  warfarin daily. Recheck in 1 week.   Regarding medications, these were reordered and sent to [place]

## 2013-10-20 NOTE — Telephone Encounter (Signed)
Philip Matthews with [facility] would like Philip Matthews 10mg , test strips, and insulin sent to [facility] pharmacy.  She tried to get it transferred from [facility] but was having some trouble.  Can you please send these over?  Thanks [employer]

## 2013-10-20 NOTE — Telephone Encounter (Signed)
Will forward to MD. Jazmin Hartsell,CMA  

## 2013-10-20 NOTE — Telephone Encounter (Signed)
[facility] called to let the doctors know that Philip Matthews's IRN is 2.18. Please call [phone] to give orders or fax orders to [phone] att PCM. JW

## 2013-10-20 NOTE — Progress Notes (Deleted)
°  Subjective:    Patient ID: Philip Matthews, male    DOB: 12-18-1928, 79 y.o.   MRN: [id number]  HPI trim nails and look at bottom of both feet    Review of Systems     Objective:   Physical Exam        Assessment & Plan:

## 2013-10-20 NOTE — Patient Instructions (Signed)

## 2013-10-21 NOTE — Telephone Encounter (Signed)
April at [facility] informed. Patton Swisher, [name]

## 2013-10-26 NOTE — Progress Notes (Signed)
HPI: Patient presents today for follow up of diabetic foot and nail care. Past medical history, meds, and allergies reviewed.   Objective:  Neurovascular status unchanged with palpable pedal pulses and neurological sensation unchanged. Patients nails are elongated, thickened, discolored, dystrophic with ingrown deformity present.  + hyperkeratotic and pre-ulcerative lesions present.  No breakdown of integument noted   Assessment: Diabetes with Neuropathy/Angiopathy , Ingrown nail deformity, hyperkeratotic lesion   Plan: Discussed treatment options and alternatives. Debrided nails without complication. Debrided hyperkeratotic lesions without complication.  Return appointment recommended at routine intervals of 3 months.   Marlowe Aschoff, DPM

## 2013-10-27 ENCOUNTER — Telehealth: Payer: Self-pay | Admitting: *Deleted

## 2013-10-27 NOTE — Telephone Encounter (Signed)
Philip Matthews with [facility] called to report INR of 1.2, spoke with MD and informed Philip Matthews to have patient take an extra half tablet today and then resume 5mg  daily and recheck in a week. Philip Matthews expressed understanding.

## 2013-11-03 ENCOUNTER — Telehealth: Payer: Self-pay | Admitting: Family Medicine

## 2013-11-03 DIAGNOSIS — I808 Phlebitis and thrombophlebitis of other sites: Secondary | ICD-10-CM | POA: Diagnosis not present

## 2013-11-03 NOTE — Telephone Encounter (Signed)
FPTS Emergency Call: Spoke with Judeth Cornfield from [facility] about INR.  INR: 1.8 last week On coumadin 5mg  qd. Took 7.5mg  on Wednesday of last week and then 5mg  daily.  Today INR is 1.9 Take 10mg  tonight, 7.5mg  tomorrow and Friday and then back to 5mg  daily until recheck in 1 week.   Marena Chancy, PGY-3 Family Medicine Resident

## 2013-11-04 ENCOUNTER — Telehealth: Payer: Self-pay | Admitting: *Deleted

## 2013-11-04 NOTE — Telephone Encounter (Signed)
Judeth Cornfield advised that she realized that she should have called pt nephrologist after she hung up.  Attempted to call back but we were already at lunch.  Did not relay below message to [place] as she had contacted nephrology. [place], [name]

## 2013-11-04 NOTE — Telephone Encounter (Signed)
Yes may administer but if above in the future would reconsider.

## 2013-11-04 NOTE — Telephone Encounter (Signed)
Judeth Cornfield with [facility] states they drew labs on patient yesterday and his Hgb was 11.1. She says she has orders to administer Procrit if Hgb is <11 and just wanted to make sure if MD wanted her to give it or not since it was so close. Will forward to PCP.

## 2013-11-11 ENCOUNTER — Telehealth: Payer: Self-pay | Admitting: *Deleted

## 2013-11-11 NOTE — Telephone Encounter (Signed)
Judeth Cornfield from [facility] states patient INR to was 2.0. (See previous phone note for most recent dosing). Needs to know what MD would like to do with dosing now. Will forward to PCP.

## 2013-11-12 DIAGNOSIS — M329 Systemic lupus erythematosus, unspecified: Secondary | ICD-10-CM | POA: Diagnosis not present

## 2013-11-12 DIAGNOSIS — I808 Phlebitis and thrombophlebitis of other sites: Secondary | ICD-10-CM | POA: Diagnosis not present

## 2013-11-12 DIAGNOSIS — Z5181 Encounter for therapeutic drug level monitoring: Secondary | ICD-10-CM | POA: Diagnosis not present

## 2013-11-12 DIAGNOSIS — I129 Hypertensive chronic kidney disease with stage 1 through stage 4 chronic kidney disease, or unspecified chronic kidney disease: Secondary | ICD-10-CM | POA: Diagnosis not present

## 2013-11-12 NOTE — Telephone Encounter (Signed)
Spoke with Philip Matthews who spoke with [facility]. Continue at 5mg  coumadin daily and recheck in 1 week.

## 2013-11-17 ENCOUNTER — Telehealth: Payer: Self-pay | Admitting: Family Medicine

## 2013-11-17 DIAGNOSIS — I808 Phlebitis and thrombophlebitis of other sites: Secondary | ICD-10-CM | POA: Diagnosis not present

## 2013-11-17 NOTE — Telephone Encounter (Signed)
Judeth Cornfield, [facility] nurse, called with INR 3.2   Advised to continue same dose of warfarin 5 mg -daily;  Recheck 1 week;   Dewitt Hoes, MLS

## 2013-11-18 ENCOUNTER — Ambulatory Visit (INDEPENDENT_AMBULATORY_CARE_PROVIDER_SITE_OTHER): Payer: Medicare Other | Admitting: Podiatrist

## 2013-11-18 ENCOUNTER — Encounter: Payer: Self-pay | Admitting: Podiatrist

## 2013-11-18 VITALS — BP 140/68 | HR 54 | Resp 12 | Ht 60.0 in | Wt 160.0 lb

## 2013-11-18 DIAGNOSIS — G909 Disorder of the autonomic nervous system, unspecified: Secondary | ICD-10-CM

## 2013-11-18 DIAGNOSIS — E1149 Type 2 diabetes mellitus with other diabetic neurological complication: Secondary | ICD-10-CM

## 2013-11-18 DIAGNOSIS — M216X9 Other acquired deformities of unspecified foot: Secondary | ICD-10-CM | POA: Diagnosis not present

## 2013-11-18 DIAGNOSIS — Q828 Other specified congenital malformations of skin: Secondary | ICD-10-CM | POA: Diagnosis not present

## 2013-11-18 DIAGNOSIS — E1143 Type 2 diabetes mellitus with diabetic autonomic (poly)neuropathy: Secondary | ICD-10-CM

## 2013-11-23 NOTE — Progress Notes (Signed)
HPI: Patient presents today for follow up of diabetic foot and callus care. Past medical history, meds, and allergies reviewed. An interpreter is present for today's visit. Objective: Neurovascular status unchanged with palpable pedal pulses and neurological sensation unchanged. Patients nails are asymptomatic at today's visit. + hyperkeratotic and pre-ulcerative lesions present submetatarsal one and 5 bilateral.. No breakdown of integument noted submetatarsal 5 of the right foot however is more symptomatic than the left foot. His current diabetic inserts has a offloading area present and the patient states he feels like his foot is turned out laterally. Assessment: Diabetes with Neuropathy/Angiopathy  hyperkeratotic lesion x 4 prominent plantarflexed metatarsals Plan: Discussed treatment options and alternatives.  Debrided hyperkeratotic lesions without complication. Adjusted his inserts to provide more padding to the lateral surface of the orthotic. Return appointment recommended at routine intervals of 3 months.  Marlowe Aschoff, DPM

## 2013-11-24 ENCOUNTER — Telehealth: Payer: Self-pay

## 2013-11-24 NOTE — Telephone Encounter (Signed)
Philip Matthews from [facility] call to report an INR of 2.7 and PT of 32 for patient from today.

## 2013-11-24 NOTE — Telephone Encounter (Signed)
Philip Matthews from [facility] called and would like a call back at  [phone]. She said that Philip Matthews's heart rate was in the low 40's his BP was 160/78 respiration was 14 oxygen was 99. jw

## 2013-11-24 NOTE — Telephone Encounter (Signed)
Lakasha from [facility] called to let us know that she is leaving and can not wait there. She said that if we need to talk to [place] we can call [phone] and leave a message. jw

## 2013-11-29 ENCOUNTER — Telehealth: Payer: Self-pay | Admitting: Family Medicine

## 2013-11-29 DIAGNOSIS — E119 Type 2 diabetes mellitus without complications: Secondary | ICD-10-CM

## 2013-11-29 NOTE — Telephone Encounter (Signed)
Refill request for Insulin Pen Needle (NOVOFINE) 32G X 6 MM MISC. Patient been out since Saturday.

## 2013-11-30 ENCOUNTER — Encounter: Payer: Self-pay | Admitting: Family Medicine

## 2013-11-30 ENCOUNTER — Ambulatory Visit (INDEPENDENT_AMBULATORY_CARE_PROVIDER_SITE_OTHER): Payer: Medicare Other | Admitting: Family Medicine

## 2013-11-30 VITALS — BP 160/72 | HR 44 | Temp 97.7°F | Ht 60.0 in | Wt 143.0 lb

## 2013-11-30 DIAGNOSIS — E119 Type 2 diabetes mellitus without complications: Secondary | ICD-10-CM | POA: Diagnosis not present

## 2013-11-30 DIAGNOSIS — E039 Hypothyroidism, unspecified: Secondary | ICD-10-CM | POA: Diagnosis not present

## 2013-11-30 DIAGNOSIS — E1143 Type 2 diabetes mellitus with diabetic autonomic (poly)neuropathy: Secondary | ICD-10-CM

## 2013-11-30 DIAGNOSIS — Z23 Encounter for immunization: Secondary | ICD-10-CM | POA: Diagnosis not present

## 2013-11-30 DIAGNOSIS — G909 Disorder of the autonomic nervous system, unspecified: Secondary | ICD-10-CM

## 2013-11-30 DIAGNOSIS — I10 Essential (primary) hypertension: Secondary | ICD-10-CM

## 2013-11-30 DIAGNOSIS — E1149 Type 2 diabetes mellitus with other diabetic neurological complication: Secondary | ICD-10-CM | POA: Diagnosis not present

## 2013-11-30 DIAGNOSIS — I498 Other specified cardiac arrhythmias: Secondary | ICD-10-CM | POA: Diagnosis not present

## 2013-11-30 DIAGNOSIS — R001 Bradycardia, unspecified: Secondary | ICD-10-CM

## 2013-11-30 LAB — POCT GLYCOSYLATED HEMOGLOBIN (HGB A1C): Hemoglobin A1C: 8.3

## 2013-11-30 MED ORDER — METOPROLOL SUCCINATE ER 25 MG PO TB24: 25.0000 mg | ORAL_TABLET | Freq: Every day | ORAL | Status: DC

## 2013-11-30 MED ORDER — INSULIN PEN NEEDLE 32G X 6 MM MISC: Status: DC

## 2013-11-30 MED ORDER — ZOSTER VACCINE LIVE 19400 UNT/0.65ML ~~LOC~~ SOLR: 0.6500 mL | Freq: Once | SUBCUTANEOUS | Status: DC

## 2013-11-30 NOTE — Telephone Encounter (Signed)
Difficult to use vitals from 1x without any report of symptoms to make medical decision. Patient is welcome to make an appointment for evaluation or nurse from [facility] can call if she feels patient needs to be seen.   Would ask for repeat nursing assessment (this can be used as verbal order) this week of vitals and symptoms. Will ask blue team to call to establish this or ask if patient simply should be scheduled.

## 2013-11-30 NOTE — Telephone Encounter (Signed)
Pt has appt today. Philip Matthews, Philip Matthews

## 2013-11-30 NOTE — Patient Instructions (Addendum)
Diabetes is worse (a1c 8.3 from 6.9) but this is due to missed doses of insulin. I am glad you have already caught this.   For your heart rate, take 1/2 a pill of Toprol-xL 100mg  (metoprolol succinate) for 1 week. Then take the prescription I gave you (25mg ) for 1 week. Then see me in 3 weeks. If you have dizziness, chest pain, shortness of breath, go to ED immediately.   We are going to hold the course on blood pressure for now. We will recheck in 3 weeks. PLEASE BRING ALL MEDICINES.   Thanks,  Dr. Durene Cal   We will update you on some of these at next visit Health Maintenance Due  Topic Date Due  . Urine Microalbumin  [birth year]20/1938  . Influenza Vaccine -got today 07/30/2013  . Foot Exam  10/14/2013  . Hemoglobin A1c -got today 10/28/2013

## 2013-12-01 ENCOUNTER — Telehealth: Payer: Self-pay | Admitting: Family Medicine

## 2013-12-01 DIAGNOSIS — R001 Bradycardia, unspecified: Secondary | ICD-10-CM | POA: Insufficient documentation

## 2013-12-01 DIAGNOSIS — I808 Phlebitis and thrombophlebitis of other sites: Secondary | ICD-10-CM | POA: Diagnosis not present

## 2013-12-01 MED ORDER — INSULIN PEN NEEDLE 32G X 6 MM MISC: Status: DC

## 2013-12-01 NOTE — Assessment & Plan Note (Signed)
Due to bradycardia, will recheck tsh and t4 at next visit in 2-3 weeks.

## 2013-12-01 NOTE — Assessment & Plan Note (Signed)
a1c elevated to 8.3 today. This is likely due to patient forgetting to take doses other than in AM. Family has reinforced need and patient has restarted. Recheck a1c in 3 months. Cannot be on ace-i due to hyperkalemia. Can check microalbumin at next visit though not clear what benefit would be since he cannot take ace-i. Foot exam next visit as well.

## 2013-12-01 NOTE — Assessment & Plan Note (Signed)
Patient asymptomatic. History of 1st degree heart block. On metoprolol for hypertension and no history of CAD or CHF so will gradually wean beta blocker off. Discussed with Dr. Leveda Anna and due to being asymptomatic, did not pursue EKG, would consider when off of medication or down to 25mg . Will also need to check TSH and T4 and review all medications when patient brings them with him at next visit.

## 2013-12-01 NOTE — Progress Notes (Signed)
Tana Conch, MD Phone: [phone]  Subjective:  Chief complaint-noted  # Bradycardia/hypertension/hypothyroidism Patient was seen by home health nurse last week and was found to have a pulse in the mid 40s as well as to be hypertensive. Patient states he has been completely asymptomatic with this (see ros below). He has a history of blood pressure being down into the 50s but not into the 40s. He takes toprol-xl. Does not bring medications today but thinks he only takes 1 a day (med box is filled for him). He states he takes whatever is placed in the box for him. He is not sure about the amlodipine, lasix, clonidine, hydralazine as a result of the way he takes his medicine. Last TSH mildly elevated on 25 levothyroxine. Has not had rechecked Tsh and t4 as planned.  BP Readings from Last 3 Encounters:  12/01/13 160/72  11/18/13 140/68  05/28/13 142/82  ROS-Denies any CP, HA, SOB, dizziness, lightheadedness, blurry vision, LE edema, transient weakness, orthopnea, PND. No palpitations or syncope.  Past medical history-saw Dr. Eden Emms in June for hypertension and portal vein thrombosis. No history of CAD. Does have a history of first degree block.  # DIABETES Type II Medications taking and tolerating-only taking morning insulin, family recently discovered this (this week) and has got him to restart taking his medication Blood Sugars per patient-did not bring log today and does not remember Diet-tries to eat a balanced low carb diet Regular Exercise-no  Deferred to next visit: Health Maintenance Due  Topic Date Due  . Urine Microalbumin  10/20/1940  . Foot Exam  10/14/2013  On Aspirin-yes On statin-ys, pravastatin Daily foot monitoring-yes  ROS- Denies Polyuria,Polydipsia, nocturia, Vision changes, feet or hand numbness/pain/tingling. Denies Hypoglycemia symptoms (shaky, sweaty, hungry, weak anxious, tremor, palpitations, confusion, behavior change).   Hemoglobin a1c:  Lab Results    Component Value Date   HGBA1C 8.3 11/30/2013   HGBA1C 7.0 04/28/2013   HGBA1C 6.9 09/08/2012    Past Medical History Patient Active Problem List   Diagnosis Date Noted  . Hyperkalemia 11/11/2012    Priority: High  . Portal vein thrombosis 06/08/2010    Priority: High  . Type II diabetes mellitus, controlled but with peripheral neuropathy 02/01/2010    Priority: High  . Chronic kidney disease (CKD), stage III (moderate) 02/01/2010    Priority: High  . Hypothyroid 06/17/2012    Priority: Medium  . Mixed hyperlipidemia 06/20/2010    Priority: Medium  . LIVER MASS 03/08/2010    Priority: Medium  . Isolated systolic HTN, goal SBP <150.  09/81/1914    Priority: Medium  . Allergic rhinitis 09/08/2012    Priority: Low  . Open-angle glaucoma 08/05/2012    Priority: Low  . Lower extremity edema 05/30/2012    Priority: Low  . BARRETTS ESOPHAGUS 07/11/2010    Priority: Low  . Gout, unspecified 06/07/2010    Priority: Low  . Anemia in chronic kidney disease(285.21) 02/01/2010    Priority: Low  . LUNG NODULE 03/08/2010    Medications- reviewed and updated Current Outpatient Prescriptions  Medication Sig Dispense Refill  . amLODipine (NORVASC) 10 MG tablet Take 0.5 tablets (5 mg total) by mouth daily.  90 tablet  3  . aspirin 81 MG EC tablet Take 81 mg by mouth daily. Over the counter      . calcitRIOL (ROCALTROL) 0.25 MCG capsule Take 1 capsule (0.25 mcg total) by mouth every Monday, Wednesday, and Friday.  30 capsule  5  . Calcium (CALCIUM  500/D) 500-125 MG-UNIT TABS Take 1 tablet by mouth 2 (two) times daily. Over the counter      . cloNIDine (CATAPRES - DOSED IN MG/24 HR) 0.2 mg/24hr patch Place 1 patch (0.2 mg total) onto the skin once a week.  12 patch  3  . docusate sodium (COLACE) 100 MG capsule Take 100 mg by mouth 2 (two) times daily. Over the counter      . epoetin alfa (PROCRIT) 16109 UNIT/ML injection Inject 1 mL (10,000 Units total) into the skin 3 (three) times a week.   1 mL    . Febuxostat (ULORIC) 80 MG TABS Take one tablet by mouth every other day.  45 tablet  3  . ferrous sulfate (FERRO-BOB) 325 (65 FE) MG tablet Take 325 mg by mouth daily. Over the counter      . furosemide (LASIX) 40 MG tablet Take 40 mg by mouth daily. Per renal      . glipiZIDE (GLIPIZIDE XL) 10 MG 24 hr tablet Take 1 tablet (10 mg total) by mouth daily.  90 tablet  3  . glucose blood (ONE TOUCH ULTRA TEST) test strip Check sugar 3 x daily  100 each  11  . hydrALAZINE (APRESOLINE) 25 MG tablet Take 1 tablet (25 mg total) by mouth 3 (three) times daily.  270 tablet  3  . insulin lispro (HUMALOG) 100 UNIT/ML injection Inject 2 units prior to breakfast, 4 units prior to lunch, and 2 units prior to dinner.  9 mL  4  . Insulin Pen Needle (NOVOFINE) 32G X 6 MM MISC To help draw insulin up 3x daily.  100 each  11  . latanoprost (XALATAN) 0.005 % ophthalmic solution Place 1 drop into both eyes at bedtime.      Marland Kitchen levothyroxine (SYNTHROID, LEVOTHROID) 25 MCG tablet Take 1 tablet (25 mcg total) by mouth daily before breakfast.  90 tablet  3  . loratadine (CLARITIN) 10 MG tablet Take 1 tablet (10 mg total) by mouth daily.  90 tablet  3  . metoprolol succinate (TOPROL-XL) 100 MG 24 hr tablet Take 50 mg by mouth daily. Take with or immediately following a meal.      . metoprolol succinate (TOPROL-XL) 25 MG 24 hr tablet Take 1 tablet (25 mg total) by mouth daily. After 1 week of taking 1/2 a 100mg  pill, take  Of these pills for a week then stop  20 tablet  0  . Multiple Vitamin (MULTIVITAMIN) tablet Take 1 tablet by mouth daily. Over the counter      . omeprazole (PRILOSEC) 20 MG capsule Take 20 mg by mouth daily.        Letta Pate DELICA LANCETS FINE MISC 1 each by Does not apply route 3 (three) times daily.  100 each  11  . polyethylene glycol powder (GLYCOLAX/MIRALAX) powder Take 17 g by mouth 2 (two) times daily. Hold for loose stools  527 g  6  . pravastatin (PRAVACHOL) 40 MG tablet Take 1 tablet  (40 mg total) by mouth daily.  90 tablet  3  . sodium bicarbonate 650 MG tablet Take 1 tablet (650 mg total) by mouth 2 (two) times daily.  180 tablet  3  . sodium polystyrene (KAYEXALATE) 15 GM/60ML suspension Take 60 mLs (15 g total) by mouth once. 3x weekly (tu/th/sat) to prevent high potassium.  500 mL  11  . timolol (TIMOPTIC) 0.5 % ophthalmic solution Place 1 drop into both eyes as directed.       Marland Kitchen  vitamin B-12 (CYANOCOBALAMIN) 100 MCG tablet Take 100 mcg by mouth daily. Over the counter      . warfarin (COUMADIN) 5 MG tablet Take 1-1.5 tablets (5-7.5 mg total) by mouth daily. Per schedule discussed with clinic and Bonnie (may change). TO PREVENT BLOOD CLOTS  135 tablet  3  . zoster vaccine live, PF, (ZOSTAVAX) 86578 UNT/0.65ML injection Inject 19,400 Units into the skin once.  1 each  0   No current facility-administered medications for this visit.    Objective: BP 160/72  Pulse 44  Temp(Src) 97.7 F (36.5 C) (Oral)  Ht 5' (1.524 m)  Wt 143 lb (64.864 kg)  BMI 27.93 kg/m2 Gen: NAD, resting comfortably on table CV: bradycardic but regular rhythm no murmurs rubs or gallops Lungs: CTAB no crackles, wheeze, rhonchi Ext: no edema  Assessment/Plan:  Type II diabetes mellitus, controlled but with peripheral neuropathy a1c elevated to 8.3 today. This is likely due to patient forgetting to take doses other than in AM. Family has reinforced need and patient has restarted. Recheck a1c in 3 months. Cannot be on ace-i due to hyperkalemia. Can check microalbumin at next visit though not clear what benefit would be since he cannot take ace-i. Foot exam next visit as well.   Hypothyroid Due to bradycardia, will recheck tsh and t4 at next visit in 2-3 weeks.   Isolated systolic HTN, goal SBP <150.  Isolated systolic hypertension today is new for patient as typically well controlled. Will continue to monitor for now especially off of metoprolol and have patient back in 2-3 weeks for recheck.  Asymptomatic at this time.   Bradycardia Patient asymptomatic. History of 1st degree heart block. On metoprolol for hypertension and no history of CAD or CHF so will gradually wean beta blocker off. Discussed with Dr. Leveda Anna and due to being asymptomatic, did not pursue EKG, would consider when off of medication or down to 25mg . Will also need to check TSH and T4 and review all medications when patient brings them with him at next visit.    Orders Placed This Encounter  Procedures  . POCT A1C    Meds ordered this encounter  Medications  . metoprolol succinate (TOPROL-XL) 25 MG 24 hr tablet    Sig: Take 1 tablet (25 mg total) by mouth daily. After 1 week of taking 1/2 a 100mg  pill, take  Of these pills for a week then stop    Dispense:  20 tablet    Refill:  0  . metoprolol succinate (TOPROL-XL) 100 MG 24 hr tablet    Sig: Take 50 mg by mouth daily. Take with or immediately following a meal.  . zoster vaccine live, PF, (ZOSTAVAX) 46962 UNT/0.65ML injection    Sig: Inject 19,400 Units into the skin once.    Dispense:  1 each    Refill:  0

## 2013-12-01 NOTE — Assessment & Plan Note (Signed)
Isolated systolic hypertension today is new for patient as typically well controlled. Will continue to monitor for now especially off of metoprolol and have patient back in 2-3 weeks for recheck. Asymptomatic at this time.

## 2013-12-02 NOTE — Telephone Encounter (Signed)
Stephanie from [facility] called with INR report:  INR 2.8  Advised to have pt continue warfarin 5 mg daily and recheck 1 week;  Dewitt Hoes, MLS

## 2013-12-15 ENCOUNTER — Telehealth: Payer: Self-pay | Admitting: Family Medicine

## 2013-12-15 DIAGNOSIS — M329 Systemic lupus erythematosus, unspecified: Secondary | ICD-10-CM | POA: Diagnosis not present

## 2013-12-15 DIAGNOSIS — E119 Type 2 diabetes mellitus without complications: Secondary | ICD-10-CM | POA: Diagnosis not present

## 2013-12-15 DIAGNOSIS — I808 Phlebitis and thrombophlebitis of other sites: Secondary | ICD-10-CM | POA: Diagnosis not present

## 2013-12-15 NOTE — Telephone Encounter (Signed)
[facility] nurse called with INR 2.1, protime seconds 25.1;   Advised nurse to continue warfarin 5 mg daily and recheck 1 week;   BAJORDAN, MLS

## 2013-12-20 ENCOUNTER — Encounter: Payer: Self-pay | Admitting: Family Medicine

## 2013-12-20 ENCOUNTER — Ambulatory Visit (INDEPENDENT_AMBULATORY_CARE_PROVIDER_SITE_OTHER): Payer: Medicare Other | Admitting: Family Medicine

## 2013-12-20 VITALS — BP 130/68 | HR 58 | Temp 97.7°F | Wt 145.0 lb

## 2013-12-20 DIAGNOSIS — R001 Bradycardia, unspecified: Secondary | ICD-10-CM

## 2013-12-20 DIAGNOSIS — I498 Other specified cardiac arrhythmias: Secondary | ICD-10-CM

## 2013-12-20 DIAGNOSIS — E1149 Type 2 diabetes mellitus with other diabetic neurological complication: Secondary | ICD-10-CM | POA: Diagnosis not present

## 2013-12-20 DIAGNOSIS — I10 Essential (primary) hypertension: Secondary | ICD-10-CM

## 2013-12-20 DIAGNOSIS — E1143 Type 2 diabetes mellitus with diabetic autonomic (poly)neuropathy: Secondary | ICD-10-CM

## 2013-12-20 DIAGNOSIS — E039 Hypothyroidism, unspecified: Secondary | ICD-10-CM | POA: Diagnosis not present

## 2013-12-20 DIAGNOSIS — G909 Disorder of the autonomic nervous system, unspecified: Secondary | ICD-10-CM

## 2013-12-20 MED ORDER — METOPROLOL SUCCINATE ER 25 MG PO TB24: 25.0000 mg | ORAL_TABLET | Freq: Every day | ORAL | Status: DC

## 2013-12-20 NOTE — Assessment & Plan Note (Signed)
Improved with HR in upper 50s today. Will decrease metoprolol to 25mg . F/u in 2.5 months as asymptomatic.

## 2013-12-20 NOTE — Patient Instructions (Addendum)
Heart rate looks much better today. We threw away the metoprolol 100mg  pills (which he was taking 1/2 of which is 50mg ). We are going to reduce to 25mg  pills only at this time. Once again, if you have chest pain, shortness of breath, dizziness or any other concerns, please give [place] a call.   Your blood pressure was fine on recheck.   I am glad you are taking insulin once again. Hopefully your blood sugar looks better next visit.  Your foot exam was great today. We gave you a log book today.   For your labs, I will send you a letter if there are no medication changes needed. I will call you if we need to discuss your lab results (thyroid and kidney evaluation).   See you in 2.5-3 months,  Dr. Durene Cal

## 2013-12-20 NOTE — Assessment & Plan Note (Signed)
Check TSH and T4 today. ? If related to bradycardia.

## 2013-12-20 NOTE — Progress Notes (Signed)
Philip Conch, MD Phone: [phone]  Subjective:  Chief complaint-noted  # Bradycardia/hypertension/hypothyroidism Seen by me 12/2 with plans to titrate metoprolol down to 25mg . Patient asymptomatic. Nursing has only titrated down to 50mg . Full medication review performed today and medications updated. Patient is taking amlodipine, lasix, clonidine, hydralazine as currently shows in medication list. In June, Last TSH mildly elevated on levothyroxine. States HR at home has been higher than 40s as previously noted.   ROS-Denies any CP, HA, SOB, dizziness, lightheadedness, blurry vision, LE edema, transient weakness, orthopnea, PND. No palpitations or syncope. Taking insulin again and denies hypoglycemic events  Past medical history-saw Dr. Eden Emms in June for hypertension and portal vein thrombosis. No history of CAD. Does have a history of first degree block.  HM updated to reflect no microalbumin test as could not be placed on ace-i due to hyperkalemia.   Past Medical History Patient Active Problem List   Diagnosis Date Noted  . Hyperkalemia 11/11/2012    Priority: High  . Portal vein thrombosis 06/08/2010    Priority: High  . Type II diabetes mellitus, controlled but with peripheral neuropathy 02/01/2010    Priority: High  . Chronic kidney disease (CKD), stage III (moderate) 02/01/2010    Priority: High  . Hypothyroid 06/17/2012    Priority: Medium  . Mixed hyperlipidemia 06/20/2010    Priority: Medium  . LIVER MASS 03/08/2010    Priority: Medium  . Isolated systolic HTN, goal SBP <150.  09/81/1914    Priority: Medium  . Allergic rhinitis 09/08/2012    Priority: Low  . Open-angle glaucoma 08/05/2012    Priority: Low  . Lower extremity edema 05/30/2012    Priority: Low  . BARRETTS ESOPHAGUS 07/11/2010    Priority: Low  . Gout, unspecified 06/07/2010    Priority: Low  . Anemia in chronic kidney disease(285.21) 02/01/2010    Priority: Low  . Bradycardia  12/01/2013  . LUNG NODULE 03/08/2010    Medications- reviewed and updated Current Outpatient Prescriptions  Medication Sig Dispense Refill  . amLODipine (NORVASC) 10 MG tablet Take 0.5 tablets (5 mg total) by mouth daily.  90 tablet  3  . aspirin 81 MG EC tablet Take 81 mg by mouth daily. Over the counter      . calcitRIOL (ROCALTROL) 0.25 MCG capsule Take 1 capsule (0.25 mcg total) by mouth every Monday, Wednesday, and Friday.  30 capsule  5  . Calcium (CALCIUM 500/D) 500-125 MG-UNIT TABS Take 1 tablet by mouth 2 (two) times daily. Over the counter      . cloNIDine (CATAPRES - DOSED IN MG/24 HR) 0.2 mg/24hr patch Place 1 patch (0.2 mg total) onto the skin once a week.  12 patch  3  . docusate sodium (COLACE) 100 MG capsule Take 100 mg by mouth 2 (two) times daily. Over the counter      . epoetin alfa (PROCRIT) 78295 UNIT/ML injection Inject 1 mL (10,000 Units total) into the skin 3 (three) times a week.  1 mL    . Febuxostat (ULORIC) 80 MG TABS Take one tablet by mouth every other day.  45 tablet  3  . ferrous sulfate (FERRO-BOB) 325 (65 FE) MG tablet Take 325 mg by mouth daily. Over the counter      . furosemide (LASIX) 40 MG tablet Take 40 mg by mouth daily. Per renal      . glipiZIDE (GLIPIZIDE XL) 10 MG 24 hr tablet Take 1 tablet (10 mg total) by mouth daily.  90 tablet  3  . glucose blood (ONE TOUCH ULTRA TEST) test strip Check sugar 3 x daily  100 each  11  . hydrALAZINE (APRESOLINE) 25 MG tablet Take 1 tablet (25 mg total) by mouth 3 (three) times daily.  270 tablet  3  . insulin lispro (HUMALOG) 100 UNIT/ML injection Inject 2 units prior to breakfast, 4 units prior to lunch, and 2 units prior to dinner.  9 mL  4  . Insulin Pen Needle (NOVOFINE) 32G X 6 MM MISC To help draw insulin up 3x daily.  100 each  11  . latanoprost (XALATAN) 0.005 % ophthalmic solution Place 1 drop into both eyes at bedtime.      Marland Kitchen levothyroxine (SYNTHROID, LEVOTHROID) 25 MCG tablet Take 1 tablet (25 mcg total)  by mouth daily before breakfast.  90 tablet  3  . Multiple Vitamin (MULTIVITAMIN) tablet Take 1 tablet by mouth daily. Over the counter      . omeprazole (PRILOSEC) 20 MG capsule Take 20 mg by mouth daily.        Letta Pate DELICA LANCETS FINE MISC 1 each by Does not apply route 3 (three) times daily.  100 each  11  . polyethylene glycol powder (GLYCOLAX/MIRALAX) powder Take 17 g by mouth 2 (two) times daily. Hold for loose stools  527 g  6  . pravastatin (PRAVACHOL) 40 MG tablet Take 1 tablet (40 mg total) by mouth daily.  90 tablet  3  . sodium polystyrene (KAYEXALATE) 15 GM/60ML suspension Take 60 mLs (15 g total) by mouth once. 3x weekly (tu/th/sat) to prevent high potassium.  500 mL  11  . timolol (TIMOPTIC) 0.5 % ophthalmic solution Place 1 drop into both eyes as directed.       . vitamin B-12 (CYANOCOBALAMIN) 100 MCG tablet Take 100 mcg by mouth daily. Over the counter      . warfarin (COUMADIN) 5 MG tablet Take 1-1.5 tablets (5-7.5 mg total) by mouth daily. Per schedule discussed with clinic and Bonnie (may change). TO PREVENT BLOOD CLOTS  135 tablet  3  . loratadine (CLARITIN) 10 MG tablet Take 1 tablet (10 mg total) by mouth daily.  90 tablet  3  . metoprolol succinate (TOPROL-XL) 25 MG 24 hr tablet Take 1 tablet (25 mg total) by mouth daily.  30 tablet  3  . sodium bicarbonate 650 MG tablet Take 1 tablet (650 mg total) by mouth 2 (two) times daily.  180 tablet  3   No current facility-administered medications for this visit.    Objective: BP 157/68  Pulse 58  Temp(Src) 97.7 F (36.5 C) (Oral)  Wt 145 lb (65.772 kg) Gen: NAD, resting comfortably on table CV: mildly bradycardic but regular rhythm no murmurs rubs or gallops Lungs: CTAB no crackles, wheeze, rhonchi Ext: no edema  Assessment/Plan:  Hypothyroid Check TSH and T4 today. ? If related to bradycardia.   Isolated systolic HTN, goal SBP <150.  Well controlled despite titrating beta blocker down. Will further decrease  to 25mg  once daily.   Bradycardia Improved with HR in upper 50s today. Will decrease metoprolol to 25mg . F/u in 2.5 months as asymptomatic.    Orders Placed This Encounter  Procedures  . TSH  . T4, free    Meds ordered this encounter  Medications  . metoprolol succinate (TOPROL-XL) 25 MG 24 hr tablet    Sig: Take 1 tablet (25 mg total) by mouth daily.    Dispense:  30 tablet  Refill:  3

## 2013-12-20 NOTE — Assessment & Plan Note (Signed)
Well controlled despite titrating beta blocker down. Will further decrease to 25mg  once daily.

## 2013-12-21 LAB — T4, FREE: Free T4: 0.78 ng/dL — ABNORMAL LOW (ref 0.80–1.80)

## 2013-12-21 LAB — TSH: TSH: 6.24 u[IU]/mL — ABNORMAL HIGH (ref 0.350–4.500)

## 2013-12-22 ENCOUNTER — Telehealth: Payer: Self-pay | Admitting: *Deleted

## 2013-12-22 NOTE — Telephone Encounter (Signed)
Received a call from a nurse with [facility] requesting to move Mr Rittenhouse's INR check from today to Friday. I gave the OK as his result is always within range.Busick, Rodena Medin

## 2013-12-31 DIAGNOSIS — E119 Type 2 diabetes mellitus without complications: Secondary | ICD-10-CM | POA: Diagnosis not present

## 2013-12-31 DIAGNOSIS — M329 Systemic lupus erythematosus, unspecified: Secondary | ICD-10-CM | POA: Diagnosis not present

## 2013-12-31 DIAGNOSIS — I129 Hypertensive chronic kidney disease with stage 1 through stage 4 chronic kidney disease, or unspecified chronic kidney disease: Secondary | ICD-10-CM | POA: Diagnosis not present

## 2013-12-31 DIAGNOSIS — I808 Phlebitis and thrombophlebitis of other sites: Secondary | ICD-10-CM | POA: Diagnosis not present

## 2014-01-03 ENCOUNTER — Telehealth: Payer: Self-pay | Admitting: Family Medicine

## 2014-01-03 DIAGNOSIS — E039 Hypothyroidism, unspecified: Secondary | ICD-10-CM

## 2014-01-03 DIAGNOSIS — R001 Bradycardia, unspecified: Secondary | ICD-10-CM

## 2014-01-03 MED ORDER — LEVOTHYROXINE SODIUM 50 MCG PO TABS: 25.0000 ug | ORAL_TABLET | Freq: Every day | ORAL | Status: DC

## 2014-01-03 NOTE — Assessment & Plan Note (Signed)
Thyroid tests show thyroid levels are still low. Have increased thyroid medicine to 68mcg. Will need to repeat test in 6 weeks. If new test stable, will send in to mail order pharmacy. May have been cause of bradycardia.

## 2014-01-03 NOTE — Assessment & Plan Note (Signed)
TSH high and T4 low. May have been cause of bradycardia. Increasing thyroid replacement at this time.

## 2014-01-03 NOTE — Telephone Encounter (Signed)
Blue team- Please inform patient of the following: Thyroid tests show thyroid levels are still low. Have increased thyroid medicine to 29mcg. Will need to repeat test in 6 weeks. If new test stable, will send in to mail order pharmacy. Stop taking 64mcg pills (or take 2 until he runs out).

## 2014-01-04 NOTE — Telephone Encounter (Signed)
LMOVM (thru [place] phone service) informing of the below. Fleeger, [name]

## 2014-01-12 ENCOUNTER — Telehealth: Payer: Self-pay | Admitting: Family Medicine

## 2014-01-12 DIAGNOSIS — I129 Hypertensive chronic kidney disease with stage 1 through stage 4 chronic kidney disease, or unspecified chronic kidney disease: Secondary | ICD-10-CM | POA: Diagnosis not present

## 2014-01-12 DIAGNOSIS — E119 Type 2 diabetes mellitus without complications: Secondary | ICD-10-CM | POA: Diagnosis not present

## 2014-01-12 DIAGNOSIS — M329 Systemic lupus erythematosus, unspecified: Secondary | ICD-10-CM | POA: Diagnosis not present

## 2014-01-12 DIAGNOSIS — I808 Phlebitis and thrombophlebitis of other sites: Secondary | ICD-10-CM | POA: Diagnosis not present

## 2014-01-12 NOTE — Telephone Encounter (Signed)
Excellent. Continue current therapy. 

## 2014-01-12 NOTE — Telephone Encounter (Signed)
Home health report: INR 2.2  Pt 25.8  Currently taking 5 mg of coumadin daily

## 2014-01-19 ENCOUNTER — Ambulatory Visit (INDEPENDENT_AMBULATORY_CARE_PROVIDER_SITE_OTHER): Payer: Medicare Other | Admitting: Podiatrist

## 2014-01-19 ENCOUNTER — Encounter: Payer: Self-pay | Admitting: Podiatrist

## 2014-01-19 VITALS — BP 150/72 | HR 72 | Resp 18

## 2014-01-19 DIAGNOSIS — M216X9 Other acquired deformities of unspecified foot: Secondary | ICD-10-CM | POA: Diagnosis not present

## 2014-01-19 DIAGNOSIS — Q828 Other specified congenital malformations of skin: Secondary | ICD-10-CM | POA: Diagnosis not present

## 2014-01-19 DIAGNOSIS — E1143 Type 2 diabetes mellitus with diabetic autonomic (poly)neuropathy: Secondary | ICD-10-CM

## 2014-01-19 DIAGNOSIS — M79609 Pain in unspecified limb: Secondary | ICD-10-CM | POA: Diagnosis not present

## 2014-01-19 DIAGNOSIS — E1149 Type 2 diabetes mellitus with other diabetic neurological complication: Secondary | ICD-10-CM | POA: Diagnosis not present

## 2014-01-19 DIAGNOSIS — B351 Tinea unguium: Secondary | ICD-10-CM | POA: Diagnosis not present

## 2014-01-19 DIAGNOSIS — G909 Disorder of the autonomic nervous system, unspecified: Secondary | ICD-10-CM | POA: Diagnosis not present

## 2014-01-19 NOTE — Patient Instructions (Signed)
Diabetes and Foot Care Diabetes may cause you to have problems because of poor blood supply (circulation) to your feet and legs. This may cause the skin on your feet to become thinner, break easier, and heal more slowly. Your skin may become dry, and the skin may peel and crack. You may also have nerve damage in your legs and feet causing decreased feeling in them. You may not notice minor injuries to your feet that could lead to infections or more serious problems. Taking care of your feet is one of the most important things you can do for yourself.  HOME CARE INSTRUCTIONS  Wear shoes at all times, even in the house. Do not go barefoot. Bare feet are easily injured.  Check your feet daily for blisters, cuts, and redness. If you cannot see the bottom of your feet, use a mirror or ask someone for help.  Wash your feet with warm water (do not use hot water) and mild soap. Then pat your feet and the areas between your toes until they are completely dry. Do not soak your feet as this can dry your skin.  Apply a moisturizing lotion or petroleum jelly (that does not contain alcohol and is unscented) to the skin on your feet and to dry, brittle toenails. Do not apply lotion between your toes.  Trim your toenails straight across. Do not dig under them or around the cuticle. File the edges of your nails with an emery board or nail file.  Do not cut corns or calluses or try to remove them with medicine.  Wear clean socks or stockings every day. Make sure they are not too tight. Do not wear knee-high stockings since they may decrease blood flow to your legs.  Wear shoes that fit properly and have enough cushioning. To break in new shoes, wear them for just a few hours a day. This prevents you from injuring your feet. Always look in your shoes before you put them on to be sure there are no objects inside.  Do not cross your legs. This may decrease the blood flow to your feet.  If you find a minor scrape,  cut, or break in the skin on your feet, keep it and the skin around it clean and dry. These areas may be cleansed with mild soap and water. Do not cleanse the area with peroxide, alcohol, or iodine.  When you remove an adhesive bandage, be sure not to damage the skin around it.  If you have a wound, look at it several times a day to make sure it is healing.  Do not use heating pads or hot water bottles. They may burn your skin. If you have lost feeling in your feet or legs, you may not know it is happening until it is too late.  Make sure your health care provider performs a complete foot exam at least annually or more often if you have foot problems. Report any cuts, sores, or bruises to your health care provider immediately. SEEK MEDICAL CARE IF:   You have an injury that is not healing.  You have cuts or breaks in the skin.  You have an ingrown nail.  You notice redness on your legs or feet.  You feel burning or tingling in your legs or feet.  You have pain or cramps in your legs and feet.  Your legs or feet are numb.  Your feet always feel cold. SEEK IMMEDIATE MEDICAL CARE IF:   There is increasing redness,   swelling, or pain in or around a wound.  There is a red line that goes up your leg.  Pus is coming from a wound.  You develop a fever or as directed by your health care provider.  You notice a bad smell coming from an ulcer or wound. Document Released: 12/13/2000 Document Revised: 08/18/2013 Document Reviewed: 05/25/2013 Exit[employer] Patient Information 2014 [employer].  

## 2014-01-19 NOTE — Progress Notes (Signed)
°  HPI: Patient presents today for follow up of diabetic foot and nail care. Past medical history, meds, and allergies reviewed.   Objective:  Neurovascular status unchanged with palpable pedal pulses and neurological sensation decreased Patients nails are elongated, thickened, discolored, dystrophic with ingrown deformity present.  + hyperkeratotic and pre-ulcerative lesions present submetatarsal 5 bilateral.  Fat pad atrophy is present bilateral.    Assessment: Diabetes with Neuropathyy , Ingrown nail deformity, hyperkeratotic lesion x 2  Plan: Discussed treatment options and alternatives. Debrided nails without complication. Debrided hyperkeratotic lesions without complication.  Return appointment recommended at routine intervals of 3 months.   Trudie Buckler, DPM

## 2014-01-26 ENCOUNTER — Telehealth: Payer: Self-pay | Admitting: *Deleted

## 2014-01-26 DIAGNOSIS — I129 Hypertensive chronic kidney disease with stage 1 through stage 4 chronic kidney disease, or unspecified chronic kidney disease: Secondary | ICD-10-CM | POA: Diagnosis not present

## 2014-01-26 DIAGNOSIS — I808 Phlebitis and thrombophlebitis of other sites: Secondary | ICD-10-CM | POA: Diagnosis not present

## 2014-01-26 DIAGNOSIS — Z5181 Encounter for therapeutic drug level monitoring: Secondary | ICD-10-CM | POA: Diagnosis not present

## 2014-01-26 DIAGNOSIS — M329 Systemic lupus erythematosus, unspecified: Secondary | ICD-10-CM | POA: Diagnosis not present

## 2014-01-26 NOTE — Telephone Encounter (Signed)
Lacasha with [facility] called with patient's ptinr results.  PT is 33.3 and INR is 2.8.  He is taking 5mg  daily.  Please advise. Trini Christiansen,CMA

## 2014-01-26 NOTE — Telephone Encounter (Signed)
Continue current dose.    Thanks

## 2014-01-27 NOTE — Telephone Encounter (Signed)
LMOVM for RN to return call. Matthews, Philip Spotted

## 2014-01-31 ENCOUNTER — Telehealth: Payer: Self-pay | Admitting: Family Medicine

## 2014-01-31 NOTE — Telephone Encounter (Signed)
Verbal orders given. Branda Chaudhary Dawn  

## 2014-01-31 NOTE — Telephone Encounter (Signed)
Lacasha from [facility] called and would like to extend her orders for 60 days. She is currently doing skilled nursing for him. Please call her at [phone]. jw

## 2014-01-31 NOTE — Telephone Encounter (Signed)
Please provide verbal order. Thanks, Annie Main

## 2014-02-09 ENCOUNTER — Telehealth: Payer: Self-pay | Admitting: Family Medicine

## 2014-02-09 DIAGNOSIS — Z5181 Encounter for therapeutic drug level monitoring: Secondary | ICD-10-CM | POA: Diagnosis not present

## 2014-02-09 DIAGNOSIS — N183 Chronic kidney disease, stage 3 unspecified: Secondary | ICD-10-CM | POA: Diagnosis not present

## 2014-02-09 DIAGNOSIS — I10 Essential (primary) hypertension: Secondary | ICD-10-CM

## 2014-02-09 NOTE — Telephone Encounter (Signed)
Please send RX for Metoprolol 25mg  to [facility]. They only have Metoprolol 100mg  on file.

## 2014-02-09 NOTE — Telephone Encounter (Signed)
[facility] nurse called with today's INR 2.7  Pt is taking 5 mg daily, advised to continue and recheck 2 weeks when she returns to see pt.   Unk Lightning, MLS

## 2014-02-10 MED ORDER — METOPROLOL SUCCINATE ER 25 MG PO TB24: 25.0000 mg | ORAL_TABLET | Freq: Every day | ORAL | Status: DC

## 2014-02-10 NOTE — Telephone Encounter (Signed)
I placed this as an electronic order.

## 2014-02-23 ENCOUNTER — Telehealth: Payer: Self-pay | Admitting: Family Medicine

## 2014-02-23 ENCOUNTER — Ambulatory Visit: Payer: Medicare Other | Admitting: Podiatrist

## 2014-02-23 DIAGNOSIS — D631 Anemia in chronic kidney disease: Secondary | ICD-10-CM | POA: Diagnosis not present

## 2014-02-23 DIAGNOSIS — N2581 Secondary hyperparathyroidism of renal origin: Secondary | ICD-10-CM | POA: Diagnosis not present

## 2014-02-23 DIAGNOSIS — N183 Chronic kidney disease, stage 3 unspecified: Secondary | ICD-10-CM | POA: Diagnosis not present

## 2014-02-23 NOTE — Telephone Encounter (Signed)
LMOVM for Philip Matthews to fax another copy to my attention. Fleeger, [name]

## 2014-02-23 NOTE — Telephone Encounter (Signed)
Spoke with [name] last week regarding a fax sent for signature of provider to have Philip Matthews receive test strips.  Have yet to get document back. Please infom if document has been receive.  Fax back upon signing.

## 2014-02-23 NOTE — Telephone Encounter (Signed)
Checked my box at lunch and no fax. . Please have them resend fax.

## 2014-02-23 NOTE — Telephone Encounter (Signed)
Reeves Forth, RN from [facility], called with lab result:  INR 2.5  Advised to continue warfarin 5 mg - daily;  Recheck 2 weeks;  Jerusalem Wert [place], MLS (ASCP)cm

## 2014-03-09 ENCOUNTER — Telehealth: Payer: Self-pay | Admitting: Family Medicine

## 2014-03-09 DIAGNOSIS — N039 Chronic nephritic syndrome with unspecified morphologic changes: Secondary | ICD-10-CM | POA: Diagnosis not present

## 2014-03-09 DIAGNOSIS — D631 Anemia in chronic kidney disease: Secondary | ICD-10-CM | POA: Diagnosis not present

## 2014-03-09 NOTE — Telephone Encounter (Signed)
Reeves Forth, RN from [facility], called with lab result:  INR 2.4  Advised to continue warfarin 5 mg - daily;  Recheck 2 weeks;  Aiman Sonn [place], MLS (ASCP)cm

## 2014-03-23 ENCOUNTER — Telehealth: Payer: Self-pay | Admitting: Family Medicine

## 2014-03-23 DIAGNOSIS — I129 Hypertensive chronic kidney disease with stage 1 through stage 4 chronic kidney disease, or unspecified chronic kidney disease: Secondary | ICD-10-CM | POA: Diagnosis not present

## 2014-03-23 DIAGNOSIS — Z5181 Encounter for therapeutic drug level monitoring: Secondary | ICD-10-CM | POA: Diagnosis not present

## 2014-03-23 DIAGNOSIS — E119 Type 2 diabetes mellitus without complications: Secondary | ICD-10-CM | POA: Diagnosis not present

## 2014-03-23 DIAGNOSIS — M329 Systemic lupus erythematosus, unspecified: Secondary | ICD-10-CM | POA: Diagnosis not present

## 2014-03-24 DIAGNOSIS — D631 Anemia in chronic kidney disease: Secondary | ICD-10-CM | POA: Diagnosis not present

## 2014-03-24 DIAGNOSIS — N039 Chronic nephritic syndrome with unspecified morphologic changes: Secondary | ICD-10-CM | POA: Diagnosis not present

## 2014-03-24 DIAGNOSIS — N2581 Secondary hyperparathyroidism of renal origin: Secondary | ICD-10-CM | POA: Diagnosis not present

## 2014-03-24 DIAGNOSIS — N183 Chronic kidney disease, stage 3 unspecified: Secondary | ICD-10-CM | POA: Diagnosis not present

## 2014-03-24 DIAGNOSIS — E875 Hyperkalemia: Secondary | ICD-10-CM | POA: Diagnosis not present

## 2014-03-24 NOTE — Telephone Encounter (Signed)
Arnetha Gula, nurse from [facility], called with lab result:  INR 1.9     Advised to continue warfarin 5 mg daily;   Recheck 2 weeks;  Oluwasemilore Bahl [place], MLS (ASCP)cm

## 2014-03-31 ENCOUNTER — Telehealth: Payer: Self-pay | Admitting: Family Medicine

## 2014-03-31 NOTE — Telephone Encounter (Signed)
LVM from Dr. Yong Channel that verbal order would be ok but since voice mail did not have ID, did not leave patient name or further information. If they call back, please relay the verbal order through RN or CMA.

## 2014-03-31 NOTE — Telephone Encounter (Signed)
Nurse called back and did receive voice mail. jw

## 2014-03-31 NOTE — Telephone Encounter (Signed)
Need verbal order to continue services for pat extended 60 day period. Daughter still not comfortable with filling pill box every 2 wks.  Please call back to give order.

## 2014-04-06 DIAGNOSIS — N183 Chronic kidney disease, stage 3 unspecified: Secondary | ICD-10-CM | POA: Diagnosis not present

## 2014-04-06 DIAGNOSIS — M329 Systemic lupus erythematosus, unspecified: Secondary | ICD-10-CM | POA: Diagnosis not present

## 2014-04-06 DIAGNOSIS — E119 Type 2 diabetes mellitus without complications: Secondary | ICD-10-CM | POA: Diagnosis not present

## 2014-04-06 DIAGNOSIS — I129 Hypertensive chronic kidney disease with stage 1 through stage 4 chronic kidney disease, or unspecified chronic kidney disease: Secondary | ICD-10-CM | POA: Diagnosis not present

## 2014-04-20 DIAGNOSIS — E119 Type 2 diabetes mellitus without complications: Secondary | ICD-10-CM | POA: Diagnosis not present

## 2014-04-20 LAB — CBC AND DIFFERENTIAL
HCT: 35 % — AB (ref 41–53)
HEMOGLOBIN: 11 g/dL — AB (ref 13.5–17.5)
Neutrophils Absolute: 4 /uL
Platelets: 170 10*3/uL (ref 150–399)
WBC: 6.3 10^3/mL

## 2014-04-21 ENCOUNTER — Ambulatory Visit: Payer: Medicare Other | Admitting: Podiatrist

## 2014-04-22 ENCOUNTER — Encounter: Payer: Self-pay | Admitting: Family Medicine

## 2014-05-04 DIAGNOSIS — I808 Phlebitis and thrombophlebitis of other sites: Secondary | ICD-10-CM | POA: Diagnosis not present

## 2014-05-04 DIAGNOSIS — E119 Type 2 diabetes mellitus without complications: Secondary | ICD-10-CM | POA: Diagnosis not present

## 2014-05-04 DIAGNOSIS — N183 Chronic kidney disease, stage 3 unspecified: Secondary | ICD-10-CM | POA: Diagnosis not present

## 2014-05-04 DIAGNOSIS — I129 Hypertensive chronic kidney disease with stage 1 through stage 4 chronic kidney disease, or unspecified chronic kidney disease: Secondary | ICD-10-CM | POA: Diagnosis not present

## 2014-05-18 ENCOUNTER — Encounter: Payer: Self-pay | Admitting: Family Medicine

## 2014-05-18 ENCOUNTER — Telehealth: Payer: Self-pay | Admitting: Family Medicine

## 2014-05-18 ENCOUNTER — Ambulatory Visit (INDEPENDENT_AMBULATORY_CARE_PROVIDER_SITE_OTHER): Payer: Medicare Other | Admitting: Family Medicine

## 2014-05-18 VITALS — BP 148/78 | HR 62 | Temp 97.6°F | Wt 144.0 lb

## 2014-05-18 DIAGNOSIS — N183 Chronic kidney disease, stage 3 unspecified: Secondary | ICD-10-CM

## 2014-05-18 DIAGNOSIS — E039 Hypothyroidism, unspecified: Secondary | ICD-10-CM | POA: Diagnosis not present

## 2014-05-18 DIAGNOSIS — E782 Mixed hyperlipidemia: Secondary | ICD-10-CM

## 2014-05-18 DIAGNOSIS — J984 Other disorders of lung: Secondary | ICD-10-CM

## 2014-05-18 DIAGNOSIS — R001 Bradycardia, unspecified: Secondary | ICD-10-CM

## 2014-05-18 DIAGNOSIS — I10 Essential (primary) hypertension: Secondary | ICD-10-CM | POA: Diagnosis not present

## 2014-05-18 DIAGNOSIS — E1149 Type 2 diabetes mellitus with other diabetic neurological complication: Secondary | ICD-10-CM | POA: Diagnosis not present

## 2014-05-18 DIAGNOSIS — I498 Other specified cardiac arrhythmias: Secondary | ICD-10-CM | POA: Diagnosis not present

## 2014-05-18 DIAGNOSIS — G909 Disorder of the autonomic nervous system, unspecified: Secondary | ICD-10-CM

## 2014-05-18 DIAGNOSIS — E1143 Type 2 diabetes mellitus with diabetic autonomic (poly)neuropathy: Secondary | ICD-10-CM

## 2014-05-18 DIAGNOSIS — I81 Portal vein thrombosis: Secondary | ICD-10-CM | POA: Diagnosis not present

## 2014-05-18 LAB — BASIC METABOLIC PANEL
BUN: 65 mg/dL — ABNORMAL HIGH (ref 6–23)
CHLORIDE: 104 meq/L (ref 96–112)
CO2: 28 meq/L (ref 19–32)
Calcium: 9.3 mg/dL (ref 8.4–10.5)
Creat: 1.75 mg/dL — ABNORMAL HIGH (ref 0.50–1.35)
Glucose, Bld: 155 mg/dL — ABNORMAL HIGH (ref 70–99)
POTASSIUM: 4.8 meq/L (ref 3.5–5.3)
Sodium: 141 mEq/L (ref 135–145)

## 2014-05-18 LAB — LIPID PANEL
Cholesterol: 109 mg/dL (ref 0–200)
HDL: 50 mg/dL (ref >39)
LDL CALC: 38 mg/dL (ref 0–99)
TRIGLYCERIDES: 107 mg/dL (ref <150)
Total CHOL/HDL Ratio: 2.2 Ratio
VLDL: 21 mg/dL (ref 0–40)

## 2014-05-18 LAB — POCT INR: INR: 2.1

## 2014-05-18 LAB — POCT GLYCOSYLATED HEMOGLOBIN (HGB A1C): Hemoglobin A1C: 7.7

## 2014-05-18 LAB — TSH: TSH: 3.967 u[IU]/mL (ref 0.350–4.500)

## 2014-05-18 LAB — T4, FREE: FREE T4: 0.92 ng/dL (ref 0.80–1.80)

## 2014-05-18 NOTE — Assessment & Plan Note (Signed)
Has resolved with decreased metoprolol and increased levothyroxine. Continue to follow. Asymptomatic still.

## 2014-05-18 NOTE — Patient Instructions (Signed)
Diabetes  Hopeful we will get paperwork today and I will sign this for you to get supplies  Keep taking current medicine  Check blood sugar before eating in the morning at least once a week (if creeps above 150 or below 100 let me know)  Please make sure to get your eye exam and have them fax [place] results. Your yearly was due in April.  Thyroid  Check level today  High Blood pressure  OK, continue current medine  Heart rate is better  Lung nodule history   This was mentioned by Dr. Tamala Julian but I cannot find records  We made a decision today to not pursue further imaging unless symptoms develop such as chest pain or shortness of breath  For your labs, I will send you a letter if there are no medication changes needed. I will call you if we need to discuss your lab results.  Orders Placed This Encounter  Procedures  . TSH  . T4, free  . Basic metabolic panel  . Lipid panel  . POCT A1C  . INR   See [place] back in 6 months, Thanks for letting me be your doctor during these last 2 years,  Dr. Yong Channel

## 2014-05-18 NOTE — Assessment & Plan Note (Addendum)
Well controlled. Continue Amlodipine 10mg , clonidine 0.2mg /24 hrs, furosemide 40mg  daily, hydralazine 25mg  TID.

## 2014-05-18 NOTE — Assessment & Plan Note (Signed)
I cannot find a record of this CXR report. Possibly from outside hospital. Family and patient not sure where this was completed. Patient asymptomatic with no constitutional symptoms. Joint decision made with family to not pursue further imaging unless symptoms arise.

## 2014-05-18 NOTE — Telephone Encounter (Signed)
attempted to call Lacasha x 2.  Once she was breaking up and the next time I only heard a busy signal.  When she calls back please find out what the discrepancies are. Philip Matthews

## 2014-05-18 NOTE — Progress Notes (Signed)
Garret Reddish, MD Phone: [phone]  Subjective:   Philip Matthews is a 80 y.o. year old very pleasant male patient who presents with the following:  DIABETES Type II Medications taking and tolerating-yes, novolog 2,4, 2 plus glipizide 10mg  XL Blood Sugars per patient-fasting- last checked in January but was in 120-140 range Diet-renal diet Regular Exercise-no Eye exam-states had this done in April and will have them fax [place] results On Aspirin-yes On statin-yes Daily foot monitoring-yes  ROS- Denies Polyuria,Polydipsia, nocturia, Vision changes, feet or hand numbness/pain/tingling. Denies  Hypoglycemia symptoms (shaky, sweaty, hungry, weak anxious, tremor, palpitations, confusion, behavior change).   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 Mild LE edema. Denies palpitations or weakness.   Hypothyroidism Has been taking 50 mcg levothyroxine. No concerns.  ROS-No hair or nail changes. No heat/cold intolerance. No constipation or diarrhea. Denies shakiness or anxiety. Gained 1 lb in 6 months. No palpitations.   Lung Nodule on problem list Mentioned under problem list. I cannot find chest x-ray or imaging that identifies this. Family and patient were unaware. Reviewed consultation reports and do not find mention of CXR either back through 2011.  ROS- Denies any chest pain or shortness of breath. No dyspnea on exertion. No night sweats or unintentional weight loss.     Past Medical History- Patient Active Problem List   Diagnosis Date Noted  . Hyperkalemia 11/11/2012    Priority: High  . Portal vein thrombosis 06/08/2010    Priority: High  . Type II diabetes mellitus, controlled but with peripheral neuropathy 02/01/2010    Priority: High  . Chronic kidney disease (CKD), stage III (moderate) 02/01/2010    Priority: High  . Hypothyroid 06/17/2012    Priority: Medium  . Mixed hyperlipidemia 06/20/2010    Priority: Medium  . LIVER MASS 03/08/2010    Priority: Medium  . Isolated systolic HTN, goal SBP <914.  02/01/2010    Priority: Medium  . Bradycardia 12/01/2013    Priority: Low  . Allergic rhinitis 09/08/2012    Priority: Low  . Open-angle glaucoma 08/05/2012    Priority: Low  . Lower extremity edema 05/30/2012    Priority: Low  . BARRETTS ESOPHAGUS 07/11/2010    Priority: Low  . Gout, unspecified 06/07/2010    Priority: Low  . LUNG NODULE 03/08/2010    Priority: Low  . Anemia in chronic kidney disease(285.21) 02/01/2010    Priority: Low   Medications- reviewed and updated Current Outpatient Prescriptions  Medication Sig Dispense Refill  . amLODipine (NORVASC) 10 MG tablet Take 0.5 tablets (5 mg total) by mouth daily.  90 tablet  3  . aspirin 81 MG EC tablet Take 81 mg by mouth daily. Over the counter      . calcitRIOL (ROCALTROL) 0.25 MCG capsule Take 1 capsule (0.25 mcg total) by mouth every Monday, Wednesday, and Friday.  30 capsule  5  . Calcium (CALCIUM 500/D) 500-125 MG-UNIT TABS Take 1 tablet by mouth 2 (two) times daily. Over the  counter      . cloNIDine (CATAPRES - DOSED IN MG/24 HR) 0.2 mg/24hr patch Place 1 patch (0.2 mg total) onto the skin once a week.  12 patch  3  . docusate sodium (COLACE) 100 MG capsule Take 100 mg by mouth 2 (two) times daily. Over the counter      . epoetin alfa (PROCRIT) 19417 UNIT/ML injection Inject 1 mL (10,000 Units total) into the skin 3 (three) times a week.  1 mL    . Febuxostat (ULORIC)  80 MG TABS Take one tablet by mouth every other day.  45 tablet  3  . ferrous sulfate (FERRO-BOB) 325 (65 FE) MG tablet Take 325 mg by mouth daily. Over the counter      . furosemide (LASIX) 40 MG tablet Take 40 mg by mouth daily. Per renal      . glipiZIDE (GLIPIZIDE XL) 10 MG 24 hr tablet Take 1 tablet (10 mg total) by mouth daily.  90 tablet  3  . glucose blood (ONE TOUCH ULTRA TEST) test strip Check sugar 3 x daily  100 each  11  . hydrALAZINE (APRESOLINE) 25 MG tablet Take 1 tablet (25 mg total) by mouth 3 (three) times daily.  270 tablet  3  . insulin lispro (HUMALOG) 100 UNIT/ML injection Inject 2 units prior to breakfast, 4 units prior to lunch, and 2 units prior to dinner.  9 mL  4  . latanoprost (XALATAN) 0.005 % ophthalmic solution Place 1 drop into both eyes at bedtime.      Marland Kitchen levothyroxine (SYNTHROID, LEVOTHROID) 50 MCG tablet Take 0.5 tablets (25 mcg total) by mouth daily before breakfast.  30 tablet  1  . metoprolol succinate (TOPROL-XL) 25 MG 24 hr tablet Take 1 tablet (25 mg total) by mouth daily.  90 tablet  3  . omeprazole (PRILOSEC) 20 MG capsule Take 20 mg by mouth daily.        . polyethylene glycol powder (GLYCOLAX/MIRALAX) powder Take 17 g by mouth 2 (two) times daily. Hold for loose stools  527 g  6  . pravastatin (PRAVACHOL) 40 MG tablet Take 1 tablet (40 mg total) by mouth daily.  90 tablet  3  . sodium bicarbonate 650 MG tablet Take 1 tablet (650 mg total) by mouth 2 (two) times daily.  180 tablet  3  . sodium polystyrene (KAYEXALATE) 15 GM/60ML suspension Take 60 mLs (15 g total) by mouth once. 3x weekly (tu/th/sat) to prevent high potassium.  500 mL  11  . timolol (TIMOPTIC) 0.5 % ophthalmic solution Place 1 drop into both eyes as directed.       . vitamin B-12 (CYANOCOBALAMIN) 100 MCG tablet Take 100 mcg by mouth daily. Over the counter      . warfarin (COUMADIN) 5 MG tablet Take 1-1.5 tablets (5-7.5 mg total) by mouth daily. Per schedule discussed with clinic and  Bonnie (may change). TO PREVENT BLOOD CLOTS  135 tablet  3  . Insulin Pen Needle (NOVOFINE) 32G X 6 MM MISC To help draw insulin up 3x daily.  100 each  11  . loratadine (CLARITIN) 10 MG tablet Take 1 tablet (10 mg total) by mouth daily.  90 tablet  3  . Multiple Vitamin (MULTIVITAMIN) tablet Take 1 tablet by mouth daily. Over the counter      . ONETOUCH DELICA LANCETS FINE MISC 1 each by Does not apply route 3 (three) times daily.  100 each  11   No current facility-administered  medications for this visit.    Objective: BP 148/78  Pulse 62  Temp(Src) 97.6 F (36.4 C) (Oral)  Wt 144 lb (65.318 kg) Gen: NAD, resting comfortably on table CV: RRR no murmurs rubs or gallops Lungs: slight crackles at bilateral bases, otherwise clear Ext: 1+ edema pitting bilaterally Skin: warm, dry, no rash   Results for orders placed in visit on 05/18/14 (from the past 24 hour(s))  POCT GLYCOSYLATED HEMOGLOBIN (HGB A1C)     Status: None   Collection Time    05/18/14  9:06 AM      Result Value Ref Range   Hemoglobin A1C 7.7      Assessment/Plan:  Type II diabetes mellitus, controlled but with peripheral neuropathy a1c 7.7. Goal <8 given age and comorbidities. Family requests checking less frequent CBG when gets equipment back and we have changed to 1-2 x weekly fasting CBG checks. They will have last eye exam faxed to me.   Isolated systolic HTN, goal SBP <703.  Well controlled. Continue Amlodipine 10mg , clonidine 0.2mg /24 hrs, furosemide 40mg  daily, hydralazine 25mg  TID.  Hypothyroid Tolerating increased synthroid, receck tsh and t4 today.   Bradycardia Has resolved with decreased metoprolol and increased levothyroxine. Continue to follow. Asymptomatic still.   Mixed hyperlipidemia Check lipids today.   LUNG NODULE I cannot find a record of this CXR report. Possibly from outside hospital. Family and patient not sure where this was completed. Patient asymptomatic with no constitutional  symptoms. Joint decision made with family to not pursue further imaging unless symptoms arise.     Orders Placed This Encounter  Procedures  . TSH  . T4, free  . Basic metabolic panel  . Lipid panel  . INR to follow up portal vein thrombus anticoagulation

## 2014-05-18 NOTE — Assessment & Plan Note (Signed)
Tolerating increased synthroid, receck tsh and t4 today.

## 2014-05-18 NOTE — Assessment & Plan Note (Signed)
Check lipids today 

## 2014-05-18 NOTE — Telephone Encounter (Signed)
Lacasha with [facility] has some question about the medication list given this morning to the pt. There are some discrepancies and the daughter would like clarification  Please call Jolinda Croak

## 2014-05-18 NOTE — Assessment & Plan Note (Signed)
a1c 7.7. Goal <8 given age and comorbidities. Family requests checking less frequent CBG when gets equipment back and we have changed to 1-2 x weekly fasting CBG checks. They will have last eye exam faxed to me.

## 2014-05-24 ENCOUNTER — Encounter: Payer: Self-pay | Admitting: Family Medicine

## 2014-06-01 ENCOUNTER — Other Ambulatory Visit: Payer: Self-pay | Admitting: Family Medicine

## 2014-06-01 DIAGNOSIS — I808 Phlebitis and thrombophlebitis of other sites: Secondary | ICD-10-CM | POA: Diagnosis not present

## 2014-06-01 DIAGNOSIS — E119 Type 2 diabetes mellitus without complications: Secondary | ICD-10-CM | POA: Diagnosis not present

## 2014-06-01 DIAGNOSIS — N183 Chronic kidney disease, stage 3 unspecified: Secondary | ICD-10-CM | POA: Diagnosis not present

## 2014-06-01 DIAGNOSIS — I129 Hypertensive chronic kidney disease with stage 1 through stage 4 chronic kidney disease, or unspecified chronic kidney disease: Secondary | ICD-10-CM | POA: Diagnosis not present

## 2014-06-15 DIAGNOSIS — Z7901 Long term (current) use of anticoagulants: Secondary | ICD-10-CM | POA: Diagnosis not present

## 2014-06-15 DIAGNOSIS — Z0184 Encounter for antibody response examination: Secondary | ICD-10-CM | POA: Diagnosis not present

## 2014-06-29 ENCOUNTER — Telehealth: Payer: Self-pay | Admitting: Family Medicine

## 2014-06-29 DIAGNOSIS — N183 Chronic kidney disease, stage 3 unspecified: Secondary | ICD-10-CM | POA: Diagnosis not present

## 2014-06-29 DIAGNOSIS — I129 Hypertensive chronic kidney disease with stage 1 through stage 4 chronic kidney disease, or unspecified chronic kidney disease: Secondary | ICD-10-CM | POA: Diagnosis not present

## 2014-06-29 DIAGNOSIS — M329 Systemic lupus erythematosus, unspecified: Secondary | ICD-10-CM | POA: Diagnosis not present

## 2014-06-29 DIAGNOSIS — E119 Type 2 diabetes mellitus without complications: Secondary | ICD-10-CM | POA: Diagnosis not present

## 2014-06-30 NOTE — Telephone Encounter (Signed)
Arnetha Gula, nurse with [facility], called with INR 2.2   She reports that the patient is taking 5 mg warfarin daily.  Our records indicate pt has told to increase to 7.5 mg starting at ov on 05-18-14 but evidently never changed his warfarin dose.  Since pt is therapeutic on 5 mg, we will have him continue 5 mg daily and nurse will recheck in 2 weeks.   Unk Lightning, MLS

## 2014-07-13 ENCOUNTER — Telehealth: Payer: Self-pay | Admitting: *Deleted

## 2014-07-13 ENCOUNTER — Other Ambulatory Visit: Payer: Self-pay | Admitting: *Deleted

## 2014-07-13 DIAGNOSIS — N183 Chronic kidney disease, stage 3 unspecified: Secondary | ICD-10-CM | POA: Diagnosis not present

## 2014-07-13 DIAGNOSIS — E119 Type 2 diabetes mellitus without complications: Secondary | ICD-10-CM

## 2014-07-13 MED ORDER — CALCITRIOL 0.25 MCG PO CAPS: 0.2500 ug | ORAL_CAPSULE | ORAL | Status: DC

## 2014-07-13 MED ORDER — GLIPIZIDE ER 10 MG PO TB24: 10.0000 mg | ORAL_TABLET | Freq: Every day | ORAL | Status: DC

## 2014-07-13 NOTE — Telephone Encounter (Signed)
Received call from [facility] with [facility] with patient's INR result of 2.1, 25.5 seconds. Mr Birdsall is taking 5 mg daily. Instructed Olivia Mackie to continue him on this dose and recheck again in two weeks.Busick, Kevin Fenton

## 2014-07-27 ENCOUNTER — Telehealth: Payer: Self-pay | Admitting: Family Medicine

## 2014-07-27 DIAGNOSIS — I808 Phlebitis and thrombophlebitis of other sites: Secondary | ICD-10-CM | POA: Diagnosis not present

## 2014-07-27 DIAGNOSIS — Z5181 Encounter for therapeutic drug level monitoring: Secondary | ICD-10-CM | POA: Diagnosis not present

## 2014-07-27 DIAGNOSIS — E119 Type 2 diabetes mellitus without complications: Secondary | ICD-10-CM | POA: Diagnosis not present

## 2014-07-27 DIAGNOSIS — M329 Systemic lupus erythematosus, unspecified: Secondary | ICD-10-CM | POA: Diagnosis not present

## 2014-07-27 DIAGNOSIS — Z7901 Long term (current) use of anticoagulants: Secondary | ICD-10-CM | POA: Diagnosis not present

## 2014-07-27 DIAGNOSIS — N183 Chronic kidney disease, stage 3 unspecified: Secondary | ICD-10-CM | POA: Diagnosis not present

## 2014-07-27 DIAGNOSIS — I129 Hypertensive chronic kidney disease with stage 1 through stage 4 chronic kidney disease, or unspecified chronic kidney disease: Secondary | ICD-10-CM | POA: Diagnosis not present

## 2014-07-27 NOTE — Telephone Encounter (Signed)
Amy, nurse from [facility], called with lab result:  INR 2.0  Seconds 23.4   Advised to continue warfarin 5 mg - daily;  Read INR report back to nurse who read dose back to me.  Recheck 2 weeks;  Essa Malachi Martinique, MLS (ASCP)cm

## 2014-08-01 ENCOUNTER — Telehealth: Payer: Self-pay | Admitting: Family Medicine

## 2014-08-01 NOTE — Telephone Encounter (Signed)
Lacasha from [facility] called and would ike to extend the orders to care for pt. They are currently handling his lab's and pill box every two weeks. They would like the order for additional 60 days. Please call Lacasha at [phone]. jw

## 2014-08-02 ENCOUNTER — Other Ambulatory Visit: Payer: Self-pay | Admitting: *Deleted

## 2014-08-02 DIAGNOSIS — E785 Hyperlipidemia, unspecified: Secondary | ICD-10-CM

## 2014-08-02 MED ORDER — PRAVASTATIN SODIUM 40 MG PO TABS: 40.0000 mg | ORAL_TABLET | Freq: Every day | ORAL | Status: DC

## 2014-08-10 ENCOUNTER — Telehealth: Payer: Self-pay | Admitting: Family Medicine

## 2014-08-10 ENCOUNTER — Other Ambulatory Visit: Payer: Self-pay | Admitting: *Deleted

## 2014-08-10 DIAGNOSIS — I10 Essential (primary) hypertension: Secondary | ICD-10-CM

## 2014-08-10 MED ORDER — HYDRALAZINE HCL 25 MG PO TABS: 25.0000 mg | ORAL_TABLET | Freq: Three times a day (TID) | ORAL | Status: DC

## 2014-08-11 NOTE — Telephone Encounter (Signed)
Arnetha Gula, nurse with [facility], called with patients INR = 2.5   No problems noted by nurse.  Advised nurse to have patient continue same dose of warfarin = 5 mg daily;  Recheck 2 weeks,  Nurse voices understanding.   Unk Lightning, MLS

## 2014-08-22 ENCOUNTER — Encounter: Payer: Self-pay | Admitting: Family Medicine

## 2014-08-22 ENCOUNTER — Ambulatory Visit (INDEPENDENT_AMBULATORY_CARE_PROVIDER_SITE_OTHER): Payer: Medicare Other | Admitting: Family Medicine

## 2014-08-22 VITALS — BP 148/72 | HR 62 | Temp 97.7°F | Ht 60.0 in | Wt 147.0 lb

## 2014-08-22 DIAGNOSIS — J984 Other disorders of lung: Secondary | ICD-10-CM | POA: Diagnosis not present

## 2014-08-22 DIAGNOSIS — I10 Essential (primary) hypertension: Secondary | ICD-10-CM

## 2014-08-22 DIAGNOSIS — N183 Chronic kidney disease, stage 3 unspecified: Secondary | ICD-10-CM | POA: Diagnosis not present

## 2014-08-22 DIAGNOSIS — E1143 Type 2 diabetes mellitus with diabetic autonomic (poly)neuropathy: Secondary | ICD-10-CM

## 2014-08-22 DIAGNOSIS — I81 Portal vein thrombosis: Secondary | ICD-10-CM

## 2014-08-22 DIAGNOSIS — G909 Disorder of the autonomic nervous system, unspecified: Secondary | ICD-10-CM | POA: Diagnosis not present

## 2014-08-22 DIAGNOSIS — E1149 Type 2 diabetes mellitus with other diabetic neurological complication: Secondary | ICD-10-CM | POA: Diagnosis not present

## 2014-08-22 DIAGNOSIS — E038 Other specified hypothyroidism: Secondary | ICD-10-CM | POA: Diagnosis not present

## 2014-08-22 LAB — POCT GLYCOSYLATED HEMOGLOBIN (HGB A1C): Hemoglobin A1C: 7.4

## 2014-08-22 MED ORDER — HYDRALAZINE HCL 25 MG PO TABS: 25.0000 mg | ORAL_TABLET | Freq: Three times a day (TID) | ORAL | Status: DC

## 2014-08-22 MED ORDER — METOPROLOL SUCCINATE ER 25 MG PO TB24: 25.0000 mg | ORAL_TABLET | Freq: Every day | ORAL | Status: DC

## 2014-08-22 NOTE — Progress Notes (Signed)
  Patient name: Philip Matthews MRN [id number]  Date of birth: 08-30-1928  CC & HPI:  Philip Matthews is a 80 y.o. male presenting today for f/u of HTN, DM, HLD, Hypothyroidism.   DIABETES  Lab Results  Component Value Date   HGBA1C 7.4 08/22/2014    Blood Sugar Ranges: Not check; having difficulties getting strips from supply company  Symptoms of Hypoglycemia?  No hypoglycemic episodes and over 2 years  CHRONIC HYPERTENSION BP Readings from Last 3 Encounters:  08/22/14 148/72  05/18/14 148/78  01/19/14 150/72    Disease Monitoring  Chest pain: no   Dyspnea: no   Claudication: no  Medication Side Effects: None   HLD Lab Results  Component Value Date   LDLCALC 38 05/18/2014    Medication Compliance: yes  Side Effects?: no muscle pain or weakness, RUQ pain; jaundice  ROS: See HPI   Medical & Surgical Hx:  Reviewed  Medications & Allergies: Reviewed  Social History: Reviewed:   Objective Findings:  Vitals: BP 148/72  Pulse 62  Temp(Src) 97.7 F (36.5 C) (Oral)  Ht 5' (1.524 m)  Wt 147 lb (66.679 kg)  BMI 28.71 kg/m2  Gen: NAD CV: RRR w/o m/r/g, pulses +2 b/l Resp: CTAB w/ normal respiratory effort LE: No edema  Assessment & Plan:   Please See Problem Focused Assessment & Plan

## 2014-08-22 NOTE — Assessment & Plan Note (Signed)
Compliant with Synthroid  - Continue current dose - Recheck TSH at next visit

## 2014-08-22 NOTE — Patient Instructions (Signed)
It was great seeing you today.   1. Please send me the records from your eye doctor and kidney doctor 2. Your Diabetes and blood pressure are under good control   Please bring all your medications to every doctors visit  Sign up for My Chart to have easy access to your labs results, and communication with your Primary care physician.  Next Appointment  Please call to make an appointment with Dr Berkley Harvey in 3 months   I look forward to talking with you again at our next visit. If you have any questions or concerns before then, please call the clinic at [phone].  Take Care,   Dr Phill Myron

## 2014-08-22 NOTE — Assessment & Plan Note (Signed)
Well controlled today  Continue current therapy

## 2014-08-22 NOTE — Assessment & Plan Note (Addendum)
Compliant with Coumadin; However INR variable  - Consider Xarelto if INR remains variable

## 2014-08-22 NOTE — Assessment & Plan Note (Signed)
A1c 7.4; Given age and commorbidities recommend Goal a1c < 8 - No symptoms of Hypoglycemia in years - Difficulties getting strips mailed to house; His daughter will call supply company - Continue current therapy; If develops hypoglycemia will consider discontinuing Insulin or glipizide

## 2014-08-22 NOTE — Assessment & Plan Note (Signed)
Follows with [facility]; Scheduled to see 09/07/14 - He will have his noted sent to [facility]

## 2014-08-24 ENCOUNTER — Telehealth: Payer: Self-pay | Admitting: Family Medicine

## 2014-08-24 ENCOUNTER — Other Ambulatory Visit: Payer: Self-pay | Admitting: Family Medicine

## 2014-08-24 DIAGNOSIS — I10 Essential (primary) hypertension: Secondary | ICD-10-CM

## 2014-08-24 MED ORDER — AMLODIPINE BESYLATE 10 MG PO TABS: 5.0000 mg | ORAL_TABLET | Freq: Every day | ORAL | Status: DC

## 2014-08-24 NOTE — Telephone Encounter (Signed)
Nurse with [facility] called with protime/INR results:   protime seconds = 26.9  INR = 2.2  Pt taking 5 mg warfarin daily;   NO bleeding, bruising, missed doses, diet change, med change   Advised nurse to have pt continue 5 mg warfarin daily and recheck 2 weeks

## 2014-08-24 NOTE — Progress Notes (Signed)
Refilled amlodipine 

## 2014-08-24 NOTE — Telephone Encounter (Signed)
Pt called because his pharmacy will not refill his Amlodipine because his doctor has changed. Can we send in a refill of his medication in with Dr. Berkley Harvey listed. Jw

## 2014-09-01 ENCOUNTER — Ambulatory Visit (INDEPENDENT_AMBULATORY_CARE_PROVIDER_SITE_OTHER): Payer: Medicare Other | Admitting: Podiatrist

## 2014-09-01 DIAGNOSIS — B351 Tinea unguium: Secondary | ICD-10-CM | POA: Diagnosis not present

## 2014-09-01 DIAGNOSIS — E1149 Type 2 diabetes mellitus with other diabetic neurological complication: Secondary | ICD-10-CM

## 2014-09-01 DIAGNOSIS — M79609 Pain in unspecified limb: Secondary | ICD-10-CM

## 2014-09-01 DIAGNOSIS — G909 Disorder of the autonomic nervous system, unspecified: Secondary | ICD-10-CM

## 2014-09-01 DIAGNOSIS — E1143 Type 2 diabetes mellitus with diabetic autonomic (poly)neuropathy: Secondary | ICD-10-CM

## 2014-09-01 DIAGNOSIS — M79676 Pain in unspecified toe(s): Secondary | ICD-10-CM

## 2014-09-01 NOTE — Patient Instructions (Signed)
Diabetes and Foot Care Diabetes may cause you to have problems because of poor blood supply (circulation) to your feet and legs. This may cause the skin on your feet to become thinner, break easier, and heal more slowly. Your skin may become dry, and the skin may peel and crack. You may also have nerve damage in your legs and feet causing decreased feeling in them. You may not notice minor injuries to your feet that could lead to infections or more serious problems. Taking care of your feet is one of the most important things you can do for yourself.  HOME CARE INSTRUCTIONS  Wear shoes at all times, even in the house. Do not go barefoot. Bare feet are easily injured.  Check your feet daily for blisters, cuts, and redness. If you cannot see the bottom of your feet, use a mirror or ask someone for help.  Wash your feet with warm water (do not use hot water) and mild soap. Then pat your feet and the areas between your toes until they are completely dry. Do not soak your feet as this can dry your skin.  Apply a moisturizing lotion or petroleum jelly (that does not contain alcohol and is unscented) to the skin on your feet and to dry, brittle toenails. Do not apply lotion between your toes.  Trim your toenails straight across. Do not dig under them or around the cuticle. File the edges of your nails with an emery board or nail file.  Do not cut corns or calluses or try to remove them with medicine.  Wear clean socks or stockings every day. Make sure they are not too tight. Do not wear knee-high stockings since they may decrease blood flow to your legs.  Wear shoes that fit properly and have enough cushioning. To break in new shoes, wear them for just a few hours a day. This prevents you from injuring your feet. Always look in your shoes before you put them on to be sure there are no objects inside.  Do not cross your legs. This may decrease the blood flow to your feet.  If you find a minor scrape,  cut, or break in the skin on your feet, keep it and the skin around it clean and dry. These areas may be cleansed with mild soap and water. Do not cleanse the area with peroxide, alcohol, or iodine.  When you remove an adhesive bandage, be sure not to damage the skin around it.  If you have a wound, look at it several times a day to make sure it is healing.  Do not use heating pads or hot water bottles. They may burn your skin. If you have lost feeling in your feet or legs, you may not know it is happening until it is too late.  Make sure your health care provider performs a complete foot exam at least annually or more often if you have foot problems. Report any cuts, sores, or bruises to your health care provider immediately. SEEK MEDICAL CARE IF:   You have an injury that is not healing.  You have cuts or breaks in the skin.  You have an ingrown nail.  You notice redness on your legs or feet.  You feel burning or tingling in your legs or feet.  You have pain or cramps in your legs and feet.  Your legs or feet are numb.  Your feet always feel cold. SEEK IMMEDIATE MEDICAL CARE IF:   There is increasing redness,   swelling, or pain in or around a wound.  There is a red line that goes up your leg.  Pus is coming from a wound.  You develop a fever or as directed by your health care provider.  You notice a bad smell coming from an ulcer or wound. Document Released: 12/13/2000 Document Revised: 08/18/2013 Document Reviewed: 05/25/2013 ExitCare Patient Information 2015 [employer]. This information is not intended to replace advice given to you by your health care provider. Make sure you discuss any questions you have with your health care provider.  

## 2014-09-05 NOTE — Progress Notes (Signed)
HPI: Patient presents today for follow up of diabetic foot and nail care. Past medical history, meds, and allergies reviewed.  Objective: Neurovascular status unchanged with palpable pedal pulses and neurological sensation decreased Patients nails are elongated, thickened, discolored, dystrophic with ingrown deformity present. + hyperkeratotic and pre-ulcerative lesions present submetatarsal 5 bilateral. Fat pad atrophy is present bilateral.  Assessment: Diabetes with Neuropathyy , Ingrown nail deformity, hyperkeratotic lesion x 2  Plan: Discussed treatment options and alternatives. Debrided nails without complication. Debrided hyperkeratotic lesions without complication. Return appointment recommended at routine intervals of 3 months

## 2014-09-07 ENCOUNTER — Telehealth: Payer: Self-pay | Admitting: Family Medicine

## 2014-09-07 DIAGNOSIS — N2581 Secondary hyperparathyroidism of renal origin: Secondary | ICD-10-CM | POA: Diagnosis not present

## 2014-09-07 DIAGNOSIS — I129 Hypertensive chronic kidney disease with stage 1 through stage 4 chronic kidney disease, or unspecified chronic kidney disease: Secondary | ICD-10-CM | POA: Diagnosis not present

## 2014-09-07 DIAGNOSIS — D631 Anemia in chronic kidney disease: Secondary | ICD-10-CM | POA: Diagnosis not present

## 2014-09-07 DIAGNOSIS — I10 Essential (primary) hypertension: Secondary | ICD-10-CM

## 2014-09-07 DIAGNOSIS — N183 Chronic kidney disease, stage 3 unspecified: Secondary | ICD-10-CM | POA: Diagnosis not present

## 2014-09-07 DIAGNOSIS — M1A0710 Idiopathic chronic gout, right ankle and foot, without tophus (tophi): Secondary | ICD-10-CM

## 2014-09-07 MED ORDER — FEBUXOSTAT 80 MG PO TABS: ORAL_TABLET | ORAL | Status: DC

## 2014-09-07 MED ORDER — AMLODIPINE BESYLATE 10 MG PO TABS: 5.0000 mg | ORAL_TABLET | Freq: Every day | ORAL | Status: DC

## 2014-09-07 NOTE — Telephone Encounter (Signed)
Arnetha Gula, nurse with [facility], called with INR report: Pt is currently taking warfarin 5 mg daily;  Per nurse - pt is not having any contraindications to warfarin, no bleeding or bruising,   Protime - seconds = 30.3   INR = 2.5    Advised nurse to have pt continue warfarin 5 mg daily and recheck in 2 weeks,  Result and dosing repeated back

## 2014-09-07 NOTE — Telephone Encounter (Signed)
[facility] called and needs refills on the following medication sent to the [facility]. Please call in Febuxostat and Amlodipine. The Amlodipine shows that we called in on 8/26 but [facility] does not show that they received it. jw

## 2014-09-21 ENCOUNTER — Telehealth: Payer: Self-pay | Admitting: *Deleted

## 2014-09-21 DIAGNOSIS — I129 Hypertensive chronic kidney disease with stage 1 through stage 4 chronic kidney disease, or unspecified chronic kidney disease: Secondary | ICD-10-CM | POA: Diagnosis not present

## 2014-09-21 NOTE — Telephone Encounter (Signed)
[facility] called in INR results of 2.2 with 26.7 seconds on this patient. Gave verbal instructions to continue 5 mg of warfarin daily and check again in two weeks.Alnita Aybar, Kevin Fenton

## 2014-09-26 ENCOUNTER — Telehealth: Payer: Self-pay | Admitting: Family Medicine

## 2014-09-26 NOTE — Telephone Encounter (Signed)
Olga Millers called from [facility] and is requesting verbal orders for another 60 day to do the home care. She is also requesting all refills for patients diabetic supplies be sent to [facility], he is missing several things such as lancets, pens. jw

## 2014-09-27 ENCOUNTER — Other Ambulatory Visit: Payer: Self-pay | Admitting: Family Medicine

## 2014-09-27 DIAGNOSIS — E119 Type 2 diabetes mellitus without complications: Secondary | ICD-10-CM

## 2014-09-27 NOTE — Telephone Encounter (Signed)
Yes to verbal order to continue [facility]

## 2014-09-27 NOTE — Telephone Encounter (Signed)
Philip Matthews is aware of verbal order.  MD calling pharmacy to verify diabetic supplies patient is using.  Earla Charlie,CMA

## 2014-10-05 ENCOUNTER — Other Ambulatory Visit: Payer: Self-pay | Admitting: *Deleted

## 2014-10-05 ENCOUNTER — Other Ambulatory Visit: Payer: Self-pay | Admitting: Family Medicine

## 2014-10-05 ENCOUNTER — Telehealth: Payer: Self-pay | Admitting: Family Medicine

## 2014-10-05 DIAGNOSIS — E1143 Type 2 diabetes mellitus with diabetic autonomic (poly)neuropathy: Secondary | ICD-10-CM

## 2014-10-05 DIAGNOSIS — I10 Essential (primary) hypertension: Secondary | ICD-10-CM

## 2014-10-05 MED ORDER — AMLODIPINE BESYLATE 10 MG PO TABS: 5.0000 mg | ORAL_TABLET | Freq: Every day | ORAL | Status: DC

## 2014-10-05 MED ORDER — GLIPIZIDE ER 10 MG PO TB24: 10.0000 mg | ORAL_TABLET | Freq: Every day | ORAL | Status: DC

## 2014-10-05 NOTE — Telephone Encounter (Signed)
Philip Matthews, nurse with [facility], called with INR report:   Pt is currently taking warfarin 5 mg daily;  Per nurse, pt is not having any bleeding, bruising, missed doses, or other contraindications;  pts daughter does think pt might have eaten more leafy greens recently;   Protime seconds = 19.6   INR = 1.6  Advised nurse to have pt continue to take warfarin 5 mg daily, limit leafy green intake, and recheck 2 weeks per Dr. Berkley Harvey

## 2014-10-05 NOTE — Addendum Note (Signed)
Addended by: Valerie Roys on: 10/05/2014 04:37 PM   Modules accepted: Orders

## 2014-10-05 NOTE — Telephone Encounter (Signed)
Lakesha from [facility] called and states that pt is in need of refills.  We have been trying to send them to [place] but they keep saying they are never received.  Daughter would like them sent local since they are going out of town later tomorrow.  He needs amlodipine and glipizide.  Neythan Kozlov,CMA

## 2014-10-06 ENCOUNTER — Other Ambulatory Visit: Payer: Self-pay | Admitting: *Deleted

## 2014-10-10 MED ORDER — LEVOTHYROXINE SODIUM 50 MCG PO TABS: ORAL_TABLET | ORAL | Status: DC

## 2014-10-19 ENCOUNTER — Telehealth: Payer: Self-pay | Admitting: Family Medicine

## 2014-10-19 ENCOUNTER — Other Ambulatory Visit: Payer: Self-pay | Admitting: Family Medicine

## 2014-10-19 DIAGNOSIS — E782 Mixed hyperlipidemia: Secondary | ICD-10-CM

## 2014-10-19 DIAGNOSIS — E875 Hyperkalemia: Secondary | ICD-10-CM

## 2014-10-19 MED ORDER — WARFARIN SODIUM 5 MG PO TABS: 5.0000 mg | ORAL_TABLET | Freq: Every day | ORAL | Status: DC

## 2014-10-19 MED ORDER — PRAVASTATIN SODIUM 40 MG PO TABS: 40.0000 mg | ORAL_TABLET | Freq: Every day | ORAL | Status: DC

## 2014-10-19 NOTE — Telephone Encounter (Signed)
Refill request for pravastatin & coumadin. Please send to [facility]

## 2014-10-19 NOTE — Telephone Encounter (Signed)
Philip Matthews, nurse with [facility], called with INR report:   Pt is currently taking warfarin - 5 mg daily  Per the nurse, pt is not having any bleeding, bruising, no missed doses or extra doses, no Hospital or ED visits, no diet or medication changes, no other contraindications.   Protime seconds = 40.4   INR = 3.4  Advised nurse to have pt continue 5 mg warfarin daily and recheck in 2 weeks.  Nurse repeats correct dose back to me.  Dosed per Dr. Adelene Amas, MLS (ASCP)cm

## 2014-10-21 ENCOUNTER — Ambulatory Visit (INDEPENDENT_AMBULATORY_CARE_PROVIDER_SITE_OTHER): Payer: Medicare Other | Admitting: *Deleted

## 2014-10-21 DIAGNOSIS — Z23 Encounter for immunization: Secondary | ICD-10-CM | POA: Diagnosis not present

## 2014-11-02 ENCOUNTER — Telehealth: Payer: Self-pay | Admitting: Family Medicine

## 2014-11-16 ENCOUNTER — Other Ambulatory Visit: Payer: Self-pay | Admitting: Family Medicine

## 2014-11-16 DIAGNOSIS — I10 Essential (primary) hypertension: Secondary | ICD-10-CM

## 2014-11-16 MED ORDER — HYDRALAZINE HCL 25 MG PO TABS: 25.0000 mg | ORAL_TABLET | Freq: Three times a day (TID) | ORAL | Status: DC

## 2014-11-16 MED ORDER — CLONIDINE HCL 0.2 MG/24HR TD PTWK: 0.2000 mg | MEDICATED_PATCH | TRANSDERMAL | Status: DC

## 2014-11-16 NOTE — Telephone Encounter (Signed)
Arnetha Gula, nurse with [facility], called with INR report:   Pt is currently taking warfarin - 5 mg daily  Per the nurse, pt is not having any bleeding, bruising, no missed doses or extra doses, no Hospital or ED visits, no diet or medication changes, no other contraindications.   Protime seconds = 29.3   INR = 2.4  Advised nurse to have pt continue 5 mg warfarin daily and recheck in 2 weeks.  Nurse repeats correct dose back to me.  Dosed per Dr. Adelene Amas, MLS (ASCP)cm

## 2014-11-16 NOTE — Telephone Encounter (Signed)
In process of getting all RX transferred to [facility] on [address] Needs refills on hydralazine and clonedine.

## 2014-11-16 NOTE — Telephone Encounter (Signed)
Arnetha Gula, nurse with [facility], called with INR report:  Report for 11-02-14 given to lab on 11-16-14 (she forgot to call last time) Pt is currently taking warfarin - 5 mg daily  Per the nurse, pt was not having any bleeding, bruising, no missed doses or extra doses, no Hospital or ED visits, no diet or medication changes, no other contraindications.   Protime seconds = 29.7   INR = 2.5  Advised nurse to have pt continue 5 mg warfarin daily and recheck in 2 weeks.  Nurse repeats correct dose back to me.  Dosed per Dr. Adelene Amas, MLS (ASCP)cm

## 2014-11-16 NOTE — Telephone Encounter (Deleted)
In process of getting all RX transferred to [facility] on [address] Needs refills on hydralazine and clonedine.

## 2014-11-30 ENCOUNTER — Telehealth: Payer: Self-pay | Admitting: Family Medicine

## 2014-11-30 NOTE — Telephone Encounter (Signed)
Will forward to MD. Jazmin Hartsell,CMA  

## 2014-11-30 NOTE — Telephone Encounter (Signed)
INR: 2.1 PT: 27 Is on 5 mg of coumdian daily Would like verbal orders to certify for 60 for : INR, pill box fills, CBC every 2 weeks and Procrit injection

## 2014-12-08 ENCOUNTER — Ambulatory Visit: Payer: Medicare Other | Admitting: Podiatrist

## 2014-12-08 ENCOUNTER — Encounter: Payer: Self-pay | Admitting: Family Medicine

## 2014-12-14 ENCOUNTER — Other Ambulatory Visit: Payer: Self-pay | Admitting: Family Medicine

## 2014-12-14 ENCOUNTER — Telehealth: Payer: Self-pay | Admitting: *Deleted

## 2014-12-14 NOTE — Telephone Encounter (Signed)
Received call report from [facility] with Philip Matthews's INR results. INR of 2.3 and 27.6 seconds. No changes to report. Advised to keep patient on current dose of 5 mg daily and to recheck in two weeks.Busick, Kevin Fenton

## 2014-12-21 ENCOUNTER — Ambulatory Visit (INDEPENDENT_AMBULATORY_CARE_PROVIDER_SITE_OTHER): Payer: Medicare Other | Admitting: Podiatrist

## 2014-12-21 ENCOUNTER — Ambulatory Visit: Payer: Medicare Other | Admitting: Podiatrist

## 2014-12-21 ENCOUNTER — Encounter: Payer: Self-pay | Admitting: Podiatrist

## 2014-12-21 DIAGNOSIS — E1142 Type 2 diabetes mellitus with diabetic polyneuropathy: Secondary | ICD-10-CM

## 2014-12-21 DIAGNOSIS — Q828 Other specified congenital malformations of skin: Secondary | ICD-10-CM | POA: Diagnosis not present

## 2014-12-21 DIAGNOSIS — M79676 Pain in unspecified toe(s): Secondary | ICD-10-CM

## 2014-12-21 DIAGNOSIS — B351 Tinea unguium: Secondary | ICD-10-CM | POA: Diagnosis not present

## 2014-12-21 DIAGNOSIS — G990 Autonomic neuropathy in diseases classified elsewhere: Secondary | ICD-10-CM

## 2014-12-21 DIAGNOSIS — M216X9 Other acquired deformities of unspecified foot: Secondary | ICD-10-CM

## 2014-12-21 DIAGNOSIS — E1143 Type 2 diabetes mellitus with diabetic autonomic (poly)neuropathy: Secondary | ICD-10-CM

## 2014-12-21 NOTE — Progress Notes (Signed)
HPI: Patient presents today for follow up of diabetic foot and nail care. Past medical history, meds, and allergies reviewed. Patient presents with his wife and an interpreter.  He is also requesting new diabetic shoes today.  Objective: Neurovascular status unchanged with palpable pedal pulses and neurological sensation decreased Patients nails are elongated, thickened, discolored, dystrophic with ingrown deformity present. + hyperkeratotic and pre-ulcerative lesions present submetatarsal 5 bilateral. Fat pad atrophy is present bilateral.   Assessment: Diabetes with Neuropathyy , Ingrown nail deformity, hyperkeratotic lesion x 2   Plan: Discussed treatment options and alternatives. Debrided nails without complication. Debrided hyperkeratotic lesions without complication. Diabetic shoes recommended as a preventative for ulceration in the future.  Will send in form through his primary care doctor. Return appointment recommended at routine intervals of 3 months

## 2014-12-21 NOTE — Patient Instructions (Signed)
Diabetes and Foot Care Diabetes may cause you to have problems because of poor blood supply (circulation) to your feet and legs. This may cause the skin on your feet to become thinner, break easier, and heal more slowly. Your skin may become dry, and the skin may peel and crack. You may also have nerve damage in your legs and feet causing decreased feeling in them. You may not notice minor injuries to your feet that could lead to infections or more serious problems. Taking care of your feet is one of the most important things you can do for yourself.  HOME CARE INSTRUCTIONS  Wear shoes at all times, even in the house. Do not go barefoot. Bare feet are easily injured.  Check your feet daily for blisters, cuts, and redness. If you cannot see the bottom of your feet, use a mirror or ask someone for help.  Wash your feet with warm water (do not use hot water) and mild soap. Then pat your feet and the areas between your toes until they are completely dry. Do not soak your feet as this can dry your skin.  Apply a moisturizing lotion or petroleum jelly (that does not contain alcohol and is unscented) to the skin on your feet and to dry, brittle toenails. Do not apply lotion between your toes.  Trim your toenails straight across. Do not dig under them or around the cuticle. File the edges of your nails with an emery board or nail file.  Do not cut corns or calluses or try to remove them with medicine.  Wear clean socks or stockings every day. Make sure they are not too tight. Do not wear knee-high stockings since they may decrease blood flow to your legs.  Wear shoes that fit properly and have enough cushioning. To break in new shoes, wear them for just a few hours a day. This prevents you from injuring your feet. Always look in your shoes before you put them on to be sure there are no objects inside.  Do not cross your legs. This may decrease the blood flow to your feet.  If you find a minor scrape,  cut, or break in the skin on your feet, keep it and the skin around it clean and dry. These areas may be cleansed with mild soap and water. Do not cleanse the area with peroxide, alcohol, or iodine.  When you remove an adhesive bandage, be sure not to damage the skin around it.  If you have a wound, look at it several times a day to make sure it is healing.  Do not use heating pads or hot water bottles. They may burn your skin. If you have lost feeling in your feet or legs, you may not know it is happening until it is too late.  Make sure your health care provider performs a complete foot exam at least annually or more often if you have foot problems. Report any cuts, sores, or bruises to your health care provider immediately. SEEK MEDICAL CARE IF:   You have an injury that is not healing.  You have cuts or breaks in the skin.  You have an ingrown nail.  You notice redness on your legs or feet.  You feel burning or tingling in your legs or feet.  You have pain or cramps in your legs and feet.  Your legs or feet are numb.  Your feet always feel cold. SEEK IMMEDIATE MEDICAL CARE IF:   There is increasing redness,   swelling, or pain in or around a wound.  There is a red line that goes up your leg.  Pus is coming from a wound.  You develop a fever or as directed by your health care provider.  You notice a bad smell coming from an ulcer or wound. Document Released: 12/13/2000 Document Revised: 08/18/2013 Document Reviewed: 05/25/2013 ExitCare Patient Information 2015 [employer]. This information is not intended to replace advice given to you by your health care provider. Make sure you discuss any questions you have with your health care provider.  

## 2014-12-28 ENCOUNTER — Telehealth: Payer: Self-pay | Admitting: *Deleted

## 2014-12-28 ENCOUNTER — Other Ambulatory Visit: Payer: Self-pay | Admitting: Family Medicine

## 2014-12-28 NOTE — Telephone Encounter (Signed)
Received a call from [facility] with an INR report on Philip Matthews. INR was 2.6, 31.6 seconds. No changes to report. Instructed to continue with the same dose of 5 mg daily and recheck in 2 weeks.Braelyn Jenson, Kevin Fenton

## 2015-01-11 ENCOUNTER — Telehealth: Payer: Self-pay | Admitting: Family Medicine

## 2015-01-11 ENCOUNTER — Other Ambulatory Visit: Payer: Self-pay | Admitting: Family Medicine

## 2015-01-11 DIAGNOSIS — E1143 Type 2 diabetes mellitus with diabetic autonomic (poly)neuropathy: Secondary | ICD-10-CM

## 2015-01-11 NOTE — Telephone Encounter (Signed)
Arnetha Gula, nurse with [facility], called with INR report:   Pt is currently taking warfarin - 5 mg daily  Per the nurse, pt is not having any bleeding, bruising, no missed doses or extra doses, no Hospital or ED visits, no diet or medication changes, no other contraindications.   Protime seconds = 29.8    INR  2.5   Advised nurse to have pt continue 5 mg warfarin daily and recheck in 2 weeks.  Nurse repeats correct dose back to me.  Dosed per Dr. Adelene Amas, MLS (ASCP)cm

## 2015-01-19 ENCOUNTER — Encounter: Payer: Self-pay | Admitting: Family Medicine

## 2015-01-19 LAB — CBC AND DIFFERENTIAL
HCT: 34 %
HEMATOCRIT: 34 %
HEMATOCRIT: 36 %
HGB: 10.1 g/dL
HGB: 10.5 g/dL
HGB: 11.2 g/dL
MCV: 82 fL
MCV: 83.3 fL
PLATELETS: 155
Platelets: 131
Platelets: 166
WBC: 5.7
WBC: 6.7
WBC: 7

## 2015-01-25 ENCOUNTER — Other Ambulatory Visit: Payer: Self-pay | Admitting: Family Medicine

## 2015-01-25 DIAGNOSIS — E1143 Type 2 diabetes mellitus with diabetic autonomic (poly)neuropathy: Secondary | ICD-10-CM

## 2015-01-25 DIAGNOSIS — I10 Essential (primary) hypertension: Secondary | ICD-10-CM

## 2015-01-25 DIAGNOSIS — E039 Hypothyroidism, unspecified: Secondary | ICD-10-CM

## 2015-01-25 LAB — CBC AND DIFFERENTIAL
HCT: 36 %
Hemoglobin: 11.2 g/dL — AB (ref 13–17)
MCV (KUC): 83.1 fL (ref 80–98)
Platelets: 145 10*3/uL
RBC: 4.31
RDW: 15.4
WBC: 5.9

## 2015-01-26 ENCOUNTER — Telehealth: Payer: Self-pay | Admitting: Family Medicine

## 2015-01-26 ENCOUNTER — Telehealth: Payer: Self-pay | Admitting: *Deleted

## 2015-01-26 DIAGNOSIS — N183 Chronic kidney disease, stage 3 unspecified: Secondary | ICD-10-CM

## 2015-01-26 NOTE — Telephone Encounter (Signed)
Olga Millers called is requesting a refill on the patients Ca;citriol sent to [facility] on [address] She is also requesting verbal orders to extned his skilled nursing that includes PT, INR, Pill Box, and his injection for another 60 days. Please call her at [phone]. jw

## 2015-01-26 NOTE — Telephone Encounter (Signed)
Received call from [facility] with PR/INR results of 2.2 and 25.6 sec. No changes to report. Patient is to continue same dose of 5 mg daily and recheck in two weeks.Busick, Kevin Fenton

## 2015-01-27 ENCOUNTER — Other Ambulatory Visit: Payer: Self-pay | Admitting: Family Medicine

## 2015-01-27 DIAGNOSIS — N183 Chronic kidney disease, stage 3 unspecified: Secondary | ICD-10-CM

## 2015-01-27 MED ORDER — CALCIUM 500-125 MG-UNIT PO TABS: 1.0000 | ORAL_TABLET | Freq: Two times a day (BID) | ORAL | Status: DC

## 2015-01-27 MED ORDER — CALCITRIOL 0.25 MCG PO CAPS: 0.2500 ug | ORAL_CAPSULE | ORAL | Status: DC

## 2015-01-27 NOTE — Telephone Encounter (Signed)
01/27/2015 8:43 AM Olam Idler, MD [facility] Pool Patient Calls     Comment: Ok to continue home services.      LMOVM of Lavetta Nielsen that MD gives verbal orders. Delores Edelstein, [name]

## 2015-02-03 ENCOUNTER — Encounter: Payer: Self-pay | Admitting: Family Medicine

## 2015-02-07 ENCOUNTER — Encounter: Payer: Self-pay | Admitting: Family Medicine

## 2015-02-10 ENCOUNTER — Telehealth: Payer: Self-pay | Admitting: Family Medicine

## 2015-02-10 NOTE — Telephone Encounter (Signed)
Philip Matthews called [facility] called to say that they were not able to see the pt today. Labs were not drawn. She said they do have weekend nurses to go out if you would like them to  [phone]-- Pt was called multiple  times and they also tried to contact pt daughter .

## 2015-02-23 ENCOUNTER — Telehealth: Payer: Self-pay | Admitting: Family Medicine

## 2015-02-23 ENCOUNTER — Other Ambulatory Visit: Payer: Self-pay | Admitting: Family Medicine

## 2015-02-23 DIAGNOSIS — E1143 Type 2 diabetes mellitus with diabetic autonomic (poly)neuropathy: Secondary | ICD-10-CM

## 2015-02-23 NOTE — Telephone Encounter (Signed)
Philip Matthews, nurse with [facility], called with INR report:   Pt is currently taking warfarin - 5 mg daily  Per the nurse, pt is not having any bleeding, bruising, no missed doses or extra doses, no Hospital or ED visits, no diet or medication changes, no other contraindications.   Protime seconds = 17.8  INR = 1.5 Nurse states that a different Braman went to see pt last visit.  The pt comments that the medicine container did not look the same but could not definitively say what the difference was.  Advised nurse to have pt continue 5 mg warfarin daily and recheck in 2 weeks.  Nurse repeats correct dose back to me.

## 2015-02-23 NOTE — Telephone Encounter (Signed)
Needs refills on glcotrol 10mg   Pharmacy states a 90 day supply was picked up in Jan. meds cannot be found anywhere in the house Please advise   [facility] called this in

## 2015-02-23 NOTE — Telephone Encounter (Signed)
Will forward to PCP for review. Annete Ayuso, CMA. 

## 2015-02-27 ENCOUNTER — Encounter: Payer: Self-pay | Admitting: Family Medicine

## 2015-02-27 DIAGNOSIS — N183 Chronic kidney disease, stage 3 (moderate): Secondary | ICD-10-CM | POA: Diagnosis not present

## 2015-02-27 DIAGNOSIS — N2581 Secondary hyperparathyroidism of renal origin: Secondary | ICD-10-CM | POA: Diagnosis not present

## 2015-02-27 MED ORDER — GLIPIZIDE ER 10 MG PO TB24: 10.0000 mg | ORAL_TABLET | Freq: Every day | ORAL | Status: DC

## 2015-02-27 NOTE — Progress Notes (Signed)
Pt lost prescription for diabetic medication and has been without it for 3 weeks. Desperately needs a refill for a 90 day supply of Glucotrol. Please call Cassandra for Tyeler Goedken @ [phone] when ready for pick up / sr

## 2015-02-27 NOTE — Progress Notes (Signed)
Message sent to MD as a refill request. Philip Matthews

## 2015-02-28 ENCOUNTER — Encounter: Payer: Self-pay | Admitting: Family Medicine

## 2015-02-28 LAB — CBC
HCT: 33.7
HGB: 10.3 g/dL
MCV: 83 fL (ref 80–98)
Platelets: 127 10*3/uL
RBC: 4.05
WBC: 6.1

## 2015-03-02 DIAGNOSIS — N2581 Secondary hyperparathyroidism of renal origin: Secondary | ICD-10-CM | POA: Diagnosis not present

## 2015-03-02 DIAGNOSIS — D631 Anemia in chronic kidney disease: Secondary | ICD-10-CM | POA: Diagnosis not present

## 2015-03-02 DIAGNOSIS — I129 Hypertensive chronic kidney disease with stage 1 through stage 4 chronic kidney disease, or unspecified chronic kidney disease: Secondary | ICD-10-CM | POA: Diagnosis not present

## 2015-03-02 DIAGNOSIS — N183 Chronic kidney disease, stage 3 (moderate): Secondary | ICD-10-CM | POA: Diagnosis not present

## 2015-03-08 ENCOUNTER — Telehealth: Payer: Self-pay | Admitting: Family Medicine

## 2015-03-08 NOTE — Telephone Encounter (Signed)
Pt's INR - 1.9; 5 mg daily; PT 23.4  Philip Matthews, [place] [phone]

## 2015-03-08 NOTE — Telephone Encounter (Signed)
OPENED IN ERROR, INR DOCUMENTATION ALREADY DONE

## 2015-03-09 ENCOUNTER — Ambulatory Visit: Payer: Medicare Other | Admitting: Family Medicine

## 2015-03-20 ENCOUNTER — Encounter: Payer: Self-pay | Admitting: Family Medicine

## 2015-03-20 LAB — CBC WITH DIFFERENTIAL
HCT: 33
HGB: 10.2 g/dL
MCV: 82.9 fL (ref 80–98)
Platelets: 139 10*3/uL
WBC: 5.7

## 2015-03-22 ENCOUNTER — Telehealth: Payer: Self-pay | Admitting: Family Medicine

## 2015-03-22 NOTE — Telephone Encounter (Signed)
Arnetha Gula, nurse with [facility], called with INR report:   Pt is currently taking warfarin - 5 mg daily  Per the nurse, pt is not having any bleeding, bruising, no missed doses or extra doses, no Hospital or ED visits, no diet or medication changes, no other contraindications.   Protime seconds = 28.8   INR = 1.9   Advised nurse to have pt continue 5 mg warfarin daily and recheck in 2 weeks.  Nurse repeats correct dose back to me.  Dosed per Dr. Adelene Amas, MLS (ASCP)cm

## 2015-03-23 ENCOUNTER — Ambulatory Visit: Payer: Medicare Other | Admitting: Podiatrist

## 2015-03-29 ENCOUNTER — Ambulatory Visit (INDEPENDENT_AMBULATORY_CARE_PROVIDER_SITE_OTHER): Payer: Medicare Other | Admitting: Family Medicine

## 2015-03-29 ENCOUNTER — Encounter: Payer: Self-pay | Admitting: Family Medicine

## 2015-03-29 VITALS — BP 176/78 | HR 73 | Temp 98.3°F | Ht 60.0 in | Wt 137.5 lb

## 2015-03-29 DIAGNOSIS — E1142 Type 2 diabetes mellitus with diabetic polyneuropathy: Secondary | ICD-10-CM

## 2015-03-29 DIAGNOSIS — E1143 Type 2 diabetes mellitus with diabetic autonomic (poly)neuropathy: Secondary | ICD-10-CM

## 2015-03-29 DIAGNOSIS — G990 Autonomic neuropathy in diseases classified elsewhere: Secondary | ICD-10-CM

## 2015-03-29 LAB — CBC
HEMATOCRIT: 36.8 % — AB (ref 39.0–52.0)
Hemoglobin: 11.2 g/dL — ABNORMAL LOW (ref 13.0–17.0)
MCH: 25.3 pg — ABNORMAL LOW (ref 26.0–34.0)
MCHC: 30.4 g/dL (ref 30.0–36.0)
MCV: 83.3 fL (ref 78.0–100.0)
MPV: 11.1 fL (ref 8.6–12.4)
PLATELETS: 155 10*3/uL (ref 150–400)
RBC: 4.42 MIL/uL (ref 4.22–5.81)
RDW: 15.3 % (ref 11.5–15.5)
WBC: 5.9 10*3/uL (ref 4.0–10.5)

## 2015-03-29 LAB — COMPREHENSIVE METABOLIC PANEL
ALT: 20 U/L (ref 0–53)
AST: 27 U/L (ref 0–37)
Albumin: 3.9 g/dL (ref 3.5–5.2)
Alkaline Phosphatase: 52 U/L (ref 39–117)
BUN: 65 mg/dL — AB (ref 6–23)
CALCIUM: 10 mg/dL (ref 8.4–10.5)
CO2: 28 mEq/L (ref 19–32)
Chloride: 100 mEq/L (ref 96–112)
Creat: 1.93 mg/dL — ABNORMAL HIGH (ref 0.50–1.35)
GLUCOSE: 347 mg/dL — AB (ref 70–99)
POTASSIUM: 5.6 meq/L — AB (ref 3.5–5.3)
SODIUM: 136 meq/L (ref 135–145)
TOTAL PROTEIN: 7.4 g/dL (ref 6.0–8.3)
Total Bilirubin: 0.2 mg/dL (ref 0.2–1.2)

## 2015-03-29 LAB — POCT GLYCOSYLATED HEMOGLOBIN (HGB A1C): HEMOGLOBIN A1C: 10.2

## 2015-03-29 LAB — TSH: TSH: 3.734 u[IU]/mL (ref 0.350–4.500)

## 2015-03-29 MED ORDER — ONETOUCH DELICA LANCETS FINE MISC: 1.0000 | Status: AC

## 2015-03-29 MED ORDER — ONETOUCH ULTRA 2 W/DEVICE KIT: 1.0000 | PACK | Freq: Once | Status: DC

## 2015-03-29 MED ORDER — GLUCOSE BLOOD VI STRP: ORAL_STRIP | Status: DC

## 2015-03-29 MED ORDER — INSULIN GLARGINE 100 UNITS/ML SOLOSTAR PEN: 5.0000 [IU] | PEN_INJECTOR | Freq: Every day | SUBCUTANEOUS | Status: DC

## 2015-03-29 NOTE — Patient Instructions (Addendum)
It was great seeing you today.    Stop taking glipizide. Stop Humalog  Check your blood sugar first thing every morning.   Take Lantus 5 units every morning.  Talk with your kidney doctor about whether you still need to have blood work done (CBC) every 2 weeks   Please bring all your medications to every doctors visit  Sign up for My Chart to have easy access to your labs results, and communication with your Primary care physician.  Next Appointment  Appointment with Dr Berkley Harvey in 2 weeks on 4/14 @ 3:15   I look forward to talking with you again at our next visit. If you have any questions or concerns before then, please call the clinic at [phone].  Take Care,   Dr Phill Myron  Low Blood Sugar Low blood sugar (hypoglycemia) means that the level of sugar in your blood is lower than it should be. Signs of low blood sugar include:  Getting sweaty.  Feeling hungry.  Feeling dizzy or weak.  Feeling sleepier than normal.  Feeling nervous.  Headaches.  Having a fast heartbeat. Low blood sugar can happen fast and can be an emergency. Your doctor can do tests to check your blood sugar level. You can have low blood sugar and not have diabetes. HOME CARE  Check your blood sugar as told by your doctor. If it is less than 70 mg/dl or as told by your doctor, take 1 of the following:  3 to 4 glucose tablets.   cup clear juice.   cup soda pop, not diet.  1 cup milk.  5 to 6 hard candies.  Recheck blood sugar after 15 minutes. Repeat until it is at the right level.  Eat a snack if it is more than 1 hour until the next meal.  Only take medicine as told by your doctor.  Do not skip meals. Eat on time.  Do not drink alcohol except with meals.  Check your blood glucose before driving.  Check your blood glucose before and after exercise.  Always carry treatment with you, such as glucose pills.  Always wear a medical alert bracelet if you have diabetes. GET  HELP RIGHT AWAY IF:   Your blood glucose goes below 70 mg/dl or as told by your doctor, and you:  Are confused.  Are not able to swallow.  Pass out (faint).  You cannot treat yourself. You may need someone to help you.  You have low blood sugar problems often.  You have problems from your medicines.  You are not feeling better after 3 to 4 days.  You have vision changes. MAKE SURE YOU:   Understand these instructions.  Will watch this condition.  Will get help right away if you are not doing well or get worse. Document Released: 03/12/2010 Document Revised: 03/09/2012 Document Reviewed: 03/12/2010 [facility] Patient Information 2015 [place], [place]. This information is not intended to replace advice given to you by your health care provider. Make sure you discuss any questions you have with your health care provider.  Basic Carbohydrate for Diabetes Mellitus Eating carbohydrates naturally increases the level of sugar (glucose) in your blood, so it is important for you to know the amount that is okay for you to have in every meal. Carbohydrate counting helps keep the level of glucose in your blood within normal limits. The amount of carbohydrates allowed is different for every person.  Carbohydrates are found in the following foods:  Grains, such as breads and  cereals.  Dried beans and soy products.  Starchy vegetables, such as potatoes, peas, and corn.  Fruit and fruit juices.  Milk and yogurt.  Sweets and snack foods, such as cake, cookies, candy, chips, soft drinks, and fruit drinks.  Pension scheme manager . The following list includes serving sizes of carbohydrate-rich foods that provide 15 g ofcarbohydrate per serving:   1 slice of bread (1 oz) or 1 six-inch tortilla.    of a hamburger bun or English muffin.  4-6 crackers.   cup unsweetened dry cereal.    cup hot cereal.   cup rice or pasta.    cup mashed potatoes or  of a  large baked potato.  1 cup fresh fruit or one small piece of fruit.    cup canned or frozen fruit or fruit juice.  1 cup milk.   cup plain fat-free yogurt or yogurt sweetened with artificial sweeteners.   cup cooked dried beans or starchy vegetable, such as peas, corn, or potatoes.

## 2015-03-29 NOTE — Progress Notes (Signed)
  Patient name: Philip Matthews MRN [id number]  Date of birth: [birth year]  CC & HPI:  Philip Matthews is a 81 y.o. male presenting today for DM.   DIABETES   Blood Sugar Ranges: Hasn't been checking - Didn't have meter  Symptoms of Hypoglycemia? no  Comorbid Symptoms: no Chest pain; no SOB; yes- Neuropathy: no Vision problems  Medication Compliance: yes   Medication Side Effects: Denies  Counseling  Diet pattern: Likes bread  Smoking status: Former     ROS: See HPI   Medical & Surgical Hx:  Reviewed  Medications & Allergies: Reviewed  Social History: Reviewed:   Objective Findings:  Vitals: BP 176/78 mmHg  Pulse 73  Temp(Src) 98.3 F (36.8 C) (Oral)  Ht 5' (1.524 m)  Wt 137 lb 8 oz (62.37 kg)  BMI 26.85 kg/m2  Gen: NAD CV: RRR w/o m/r/g, pulses +2 b/l Resp: CTAB w/ normal respiratory effort  Assessment & Plan:   Please See Problem Focused Assessment & Plan

## 2015-03-29 NOTE — Assessment & Plan Note (Signed)
A1c elevated today. Was taking Glipizide & Humalog TID 2u/4u/2u - Stop Glipizide & Humalog - Start Lantus 5u qam - Given Rx for OneTouch Meter and supplies. Asked to checking BGs qam  - f/u in 2 weeks

## 2015-03-31 ENCOUNTER — Encounter: Payer: Self-pay | Admitting: Family Medicine

## 2015-04-05 ENCOUNTER — Encounter: Payer: Self-pay | Admitting: *Deleted

## 2015-04-05 ENCOUNTER — Telehealth: Payer: Self-pay | Admitting: Family Medicine

## 2015-04-05 DIAGNOSIS — I808 Phlebitis and thrombophlebitis of other sites: Secondary | ICD-10-CM | POA: Diagnosis not present

## 2015-04-05 DIAGNOSIS — N183 Chronic kidney disease, stage 3 (moderate): Secondary | ICD-10-CM | POA: Diagnosis not present

## 2015-04-05 DIAGNOSIS — M329 Systemic lupus erythematosus, unspecified: Secondary | ICD-10-CM | POA: Diagnosis not present

## 2015-04-05 DIAGNOSIS — E119 Type 2 diabetes mellitus without complications: Secondary | ICD-10-CM | POA: Diagnosis not present

## 2015-04-05 NOTE — Progress Notes (Unsigned)
   Pt's daughter in nurse clinic today to inform PCP that pt's last 5 blood glucose levels are running high.  Sat 4/2( 163) 5 Units of Lantus given; Sun 4/3 (151) 5 Units of Lantus; Mon 4/4 (191) 5 Units of Lantus; Tues 4/5 (248) 5 Units Lantus and today 4/6 (229) 6 Units of Lantus given.  Blood sugars where taken before breakfast.  Pt's daughter decided today to increase Lantus to 6 Units.  Home health nurse will be visiting today at 2 PM.  Please expect a call from her regarding levels.  Please advise.  Daughter's number [phone]

## 2015-04-05 NOTE — Progress Notes (Unsigned)
Pt's daughter called back to see what the PCP has decidied about pt's blood glucose levels.  Per Dr. Berkley Harvey verbal order pt should increase Lantus by 1 Unit every other day until blood glucose level is below 150.  Pt has a follow up appt 04/13/15 with PCP.  Will forward to PCP.  Derl Barrow, RN

## 2015-04-05 NOTE — Telephone Encounter (Signed)
Family Medicine Emergency Line Telephone Note  Returned page from [facility] lab with non-critical values for Philip Matthews DOB: 09-Dec-1928 that were to be called to [place], but the lab tech did not know how to get in touch with her.   Blood drawn today, 04/05/2015 at 17:45 showed:   PT: 19.6 INR: 1.65  WBC: 7.4 RBC: 4.76 HGB: 12.2 HCT: 39.8 MCV: 83.6 MCH: 25 MCHC: 30.7 RDW: 15.2 PLT: 181  These data appear stable from recent previous CBC (3/30), though last INR was 2.1 on 05/18/2014. None of these values are emergent so this note will be forwarded to [facility] and PCP, Dr. Berkley Harvey.   [name] B. Bonner Puna, MD, PGY-2 04/05/2015 9:18 PM

## 2015-04-06 ENCOUNTER — Encounter: Payer: Self-pay | Admitting: Family Medicine

## 2015-04-06 ENCOUNTER — Other Ambulatory Visit: Payer: Self-pay | Admitting: *Deleted

## 2015-04-06 DIAGNOSIS — M1A0710 Idiopathic chronic gout, right ankle and foot, without tophus (tophi): Secondary | ICD-10-CM

## 2015-04-06 MED ORDER — FEBUXOSTAT 80 MG PO TABS: ORAL_TABLET | ORAL | Status: DC

## 2015-04-06 NOTE — Progress Notes (Signed)
Refill encounter made and sent to MD. Johnney Ou

## 2015-04-06 NOTE — Progress Notes (Signed)
Pt needs refill on Febuxostat (ULORIC), and he has changed pharmacies. He would like this Rx and future Rxs to be sent to [facility] on [address]. His last dose is today / thanks [employer], ASA

## 2015-04-13 ENCOUNTER — Encounter: Payer: Self-pay | Admitting: Family Medicine

## 2015-04-13 ENCOUNTER — Ambulatory Visit (INDEPENDENT_AMBULATORY_CARE_PROVIDER_SITE_OTHER): Payer: Medicare Other | Admitting: Family Medicine

## 2015-04-13 VITALS — BP 135/74 | HR 74 | Ht 60.0 in | Wt 136.0 lb

## 2015-04-13 DIAGNOSIS — M1A0710 Idiopathic chronic gout, right ankle and foot, without tophus (tophi): Secondary | ICD-10-CM | POA: Diagnosis not present

## 2015-04-13 DIAGNOSIS — E1142 Type 2 diabetes mellitus with diabetic polyneuropathy: Secondary | ICD-10-CM | POA: Diagnosis not present

## 2015-04-13 DIAGNOSIS — G990 Autonomic neuropathy in diseases classified elsewhere: Secondary | ICD-10-CM

## 2015-04-13 DIAGNOSIS — E1143 Type 2 diabetes mellitus with diabetic autonomic (poly)neuropathy: Secondary | ICD-10-CM

## 2015-04-13 MED ORDER — FEBUXOSTAT 80 MG PO TABS: ORAL_TABLET | ORAL | Status: DC

## 2015-04-13 NOTE — Progress Notes (Signed)
  Patient name: Philip Matthews MRN [id number]  Date of birth: 1928-06-11  CC & HPI:  Philip Matthews is a 81 y.o. male presenting today for DM and Gout.   DM He is here with his daughter today.  He reports his blood sugars have been running high since changes at last visit.  He was taking Lantus 5 units every morning, but has increased to 7 units based on a.m. blood sugar readings.  He brings his log in with a TG range 113 - 250.  Both him and his daughter are still confused as foods and how they affect his blood sugar.  He has been eating 1-2 sugar-free cookies nightly.  Denies any episodes of hypoglycemia.  Denies any chest pain, shortness of breath, vision changes.   Gout - He reports remote history of gout in his thumb. - Not recall this last attack - Recalls symptoms consistent with gout: Acute onset of swollen and red joint is extremely tender to light touch - He cannot remember how many previous gout attacks he has had  ROS: See HPI  Medications & Allergies: Reviewed   Objective Findings:  Vitals: BP 135/74 mmHg  Pulse 74  Ht 5' (1.524 m)  Wt 136 lb (61.689 kg)  BMI 26.56 kg/m2  Gen: NAD CV: RRR w/o m/r/g, pulses +2 b/l Resp: CTAB w/ normal respiratory effort  Assessment & Plan:   Please See Problem Focused Assessment & Plan

## 2015-04-13 NOTE — Patient Instructions (Signed)
It was great seeing you today.   1. Continue taking Lantus 7 units every morning.  2. Check your blood sugar every morning before breakfast. You can check you blood sugars 2 hours after meals if you want to know more about how foods effect your blood sugars.  Please call [phone] to schedule an appointment with Nutritionist: Iver Nestle in 1-2 weeks. Keep a food diary of what you eat and drink every day for the next week or two and bring that to your appointment Call the clinic with your blood sugar numbers every week.    Please bring all your medications to every doctors visit  Sign up for My Chart to have easy access to your labs results, and communication with your Primary care physician.  Next Appointment  Please make an appointment with Dr Berkley Harvey in 1 month   I look forward to talking with you again at our next visit. If you have any questions or concerns before then, please call the clinic at [phone].  Take Care,   Dr Phill Myron

## 2015-04-13 NOTE — Assessment & Plan Note (Signed)
Remote history.  He cannot recall his last attack.  However his recollection of symptoms seems consistent with gout - Trial off prophylactic treatment - Refilled Urolic for prn use in case of future attack

## 2015-04-13 NOTE — Assessment & Plan Note (Signed)
BG range: 113 - 250 - Continue Lantus 7 units daily - Advised keeping food journal for 1-2 weeks - Appointment made with Dr. Jenne Campus next week for Nutrition additional education on carbohydrates - f/u w/ PCP 3-4 weeks

## 2015-04-18 ENCOUNTER — Ambulatory Visit (INDEPENDENT_AMBULATORY_CARE_PROVIDER_SITE_OTHER): Payer: Medicare Other | Admitting: Family Medicine

## 2015-04-18 ENCOUNTER — Encounter: Payer: Self-pay | Admitting: Family Medicine

## 2015-04-18 VITALS — Ht 60.0 in | Wt 135.0 lb

## 2015-04-18 DIAGNOSIS — G990 Autonomic neuropathy in diseases classified elsewhere: Secondary | ICD-10-CM | POA: Diagnosis not present

## 2015-04-18 DIAGNOSIS — E1143 Type 2 diabetes mellitus with diabetic autonomic (poly)neuropathy: Secondary | ICD-10-CM

## 2015-04-18 DIAGNOSIS — E1142 Type 2 diabetes mellitus with diabetic polyneuropathy: Secondary | ICD-10-CM

## 2015-04-18 NOTE — Patient Instructions (Signed)
  Diet Recommendations for Diabetes   Food is made up of PROTEIN, CARBOHYDRATE, and FAT.  These food components give [place] energy (fuel).  When you eat, you need insulin to allow fuel to get into the cells of your body.  Your most abundant fuel is carbohydrate.    CARBOHYDRATE includes starch, sugar, and fiber.  Starch and sugar raise your blood sugar and provide calories to you, while fiber does not do either.    Starchy (carb) foods: Bread, rice, pasta, potatoes, corn, crackers, bagels, muffins, all baked goods.  (Fruits, milk, and yogurt also have carbohydrate, but most of these foods will not spike your blood sugar as the starchy foods will.)  A few fruits do cause high blood sugars; use small portions of bananas (limit to 1/2 at a time), grapes, watermelon, and most tropical fruits.    Protein foods: Meat, fish, poultry, eggs, dairy foods, and beans such as pinto and kidney beans (beans also provide carbohydrate).   1. Eat at least 3 meals and 1-2 snacks per day. Never go more than 4-5 hours while awake without eating. Eat breakfast within the first hour of getting up.   2. Limit starchy foods to TWO per meal and ONE per snack. ONE portion of a starchy  food is equal to the following:   - ONE slice of bread (or its equivalent, such as half of a hamburger bun).   - 1/2 cup of a "scoopable" starchy food such as potatoes or rice.   - 15 grams of carbohydrate as shown on food label.  3. Include at every meal: a protein food, a carb food, and vegetables and/or fruit.   - Obtain twice the volume of veg's as protein or carbohydrate foods for both lunch and dinner.   - Fresh or frozen veg's are best.   - Keep frozen veg's on hand for a quick vegetable serving.

## 2015-04-18 NOTE — Progress Notes (Signed)
Medical Nutrition Therapy:  Appt start time: 1100 end time:  1200. Daughter: Cassandra  Assessment:  Primary concerns today: Blood sugar control.  Mr. Doten was accompanied by his daughter Vito Backers today, and we also had a sign language interpretor with [place].  Mr. Levesque lives with Vito Backers and her husband, but he mostly shops and cooks for himself.  They would like a better understanding of dietary carb's and which foods are ok for him to eat.    Learning Readiness:  Ready  Barriers to learning/adherence to lifestyle change: knowledge deficit; however, both patient and his daughter seem highly motivated to learn, and asked good questions.    Usual eating pattern includes 3 meals and 1-2 snacks per day.  Attends [employer] Day program M-F, where they serve lunch; no diabetic diet options offered.   Frequent foods and beverages include water, coffee w/ 1 pkt Stevia, eggs, [place] bacon or sausage, Special K Fruit & Yog cereal, skim milk.   Usual physical activity includes 30 min walking 3-4 X wk.  FBG have been running 130s to 140s since starting 7 u of Lantus last Fri, 4/16.    24-hr recall: (Up at 5 AM) B (6 AM)-   2 eggs, 1 slc [place] bacon, 1 c Sp K, 1/2 c sk milk,  Snk (10 AM)-   1 c coffee, 1 tsp artif swtnr L ( PM)-  4 meatballs, gravy, 1/2 c grn beans, 1/2 c mashed pot, 4 oz o.j., fruit crisp Snk ( PM)-  --- D (5 PM)-  1 small hambgr, 1/2 c mixed veg's, 1/2 c brn rice, water Snk (7 PM)-  2 oatmeal cookies Typical day? Yes.    Progress Towards Goal(s):  In progress.   Nutritional Diagnosis:  NB-1.1 Food and nutrition-related knowledge deficit As related to BG control.  As evidenced by recent A1C of >10.    Intervention:  Nutrition education.  Handouts given during visit include:  AVS  Demonstrated degree of understanding via:  Teach Back   Monitoring/Evaluation:  Dietary intake, exercise, FBG, and body weight in 5 week(s).

## 2015-04-19 ENCOUNTER — Telehealth: Payer: Self-pay | Admitting: Family Medicine

## 2015-04-20 ENCOUNTER — Ambulatory Visit (INDEPENDENT_AMBULATORY_CARE_PROVIDER_SITE_OTHER): Payer: Medicare Other

## 2015-04-20 DIAGNOSIS — Q828 Other specified congenital malformations of skin: Secondary | ICD-10-CM

## 2015-04-20 DIAGNOSIS — B351 Tinea unguium: Secondary | ICD-10-CM | POA: Diagnosis not present

## 2015-04-20 DIAGNOSIS — M79676 Pain in unspecified toe(s): Secondary | ICD-10-CM

## 2015-04-20 NOTE — Progress Notes (Signed)
Presents today chief complaint of painful elongated toenails.  Objective: Pulses are palpable bilateral nails are thick, yellow dystrophic onychomycosis and painful palpation.   Assessment: Onychomycosis with pain in limb.  Plan: Treatment of nails in thickness and length as covered service secondary to pain.  

## 2015-04-20 NOTE — Telephone Encounter (Signed)
Arnetha Gula, nurse with [facility], called with INR report:   Pt is currently taking warfarin - 5 mg daily  Per the nurse, pt is not having any bleeding, bruising, no missed doses or extra doses, no Hospital or ED visits, no diet or medication changes, no other contraindications.   Protime seconds = 25.6   INR = 2.1  Advised nurse to have pt continue 5 mg warfarin daily and recheck in 2 weeks.  Nurse repeats correct dose back to me.

## 2015-04-21 ENCOUNTER — Encounter: Payer: Self-pay | Admitting: Family Medicine

## 2015-04-21 LAB — CBC WITH DIFFERENTIAL
GRANULOCYTES: 68
HCT: 35.8
Hemoglobin POC: 11.1 g/dL — AB (ref 13–17)
MCV: 81.5
Platelet: 175
WBC: 7

## 2015-05-03 ENCOUNTER — Telehealth: Payer: Self-pay | Admitting: Family Medicine

## 2015-05-03 DIAGNOSIS — E119 Type 2 diabetes mellitus without complications: Secondary | ICD-10-CM | POA: Diagnosis not present

## 2015-05-03 DIAGNOSIS — N183 Chronic kidney disease, stage 3 (moderate): Secondary | ICD-10-CM | POA: Diagnosis not present

## 2015-05-03 DIAGNOSIS — M329 Systemic lupus erythematosus, unspecified: Secondary | ICD-10-CM | POA: Diagnosis not present

## 2015-05-03 DIAGNOSIS — I129 Hypertensive chronic kidney disease with stage 1 through stage 4 chronic kidney disease, or unspecified chronic kidney disease: Secondary | ICD-10-CM | POA: Diagnosis not present

## 2015-05-03 DIAGNOSIS — I808 Phlebitis and thrombophlebitis of other sites: Secondary | ICD-10-CM | POA: Diagnosis not present

## 2015-05-03 NOTE — Telephone Encounter (Signed)
Reeves Forth, nurse with [facility], called with INR report:   Pt is currently taking warfarin - 5 mg daily  Per the nurse, pt is not having any bleeding, bruising, no missed doses or extra doses, no Hospital or ED visits, no diet or medication changes, no other contraindications.   Protime seconds = 22.1   INR = 1.8  Advised nurse to have pt continue 5 mg warfarin daily and recheck in 2 weeks.  Nurse repeats correct dose back to me.

## 2015-05-05 ENCOUNTER — Encounter: Payer: Self-pay | Admitting: Family Medicine

## 2015-05-05 LAB — IRON AND TIBC
IRON: 36
Iron Saturation: 17
TIBC: 208
UIBC: 172

## 2015-05-05 LAB — CBC AND DIFFERENTIAL
HCT: 34.3
Hemoglobin POC: 10.6 g/dL — AB (ref 13–17)
MCV: 81.5
Platelet: 170
WBC: 5.4

## 2015-05-09 ENCOUNTER — Encounter: Payer: Self-pay | Admitting: Family Medicine

## 2015-05-09 ENCOUNTER — Ambulatory Visit (INDEPENDENT_AMBULATORY_CARE_PROVIDER_SITE_OTHER): Payer: Medicare Other | Admitting: Family Medicine

## 2015-05-09 VITALS — BP 139/69 | HR 75 | Temp 98.6°F | Wt 136.1 lb

## 2015-05-09 DIAGNOSIS — E1143 Type 2 diabetes mellitus with diabetic autonomic (poly)neuropathy: Secondary | ICD-10-CM

## 2015-05-09 DIAGNOSIS — E875 Hyperkalemia: Secondary | ICD-10-CM

## 2015-05-09 DIAGNOSIS — M79641 Pain in right hand: Secondary | ICD-10-CM | POA: Diagnosis not present

## 2015-05-09 DIAGNOSIS — E1142 Type 2 diabetes mellitus with diabetic polyneuropathy: Secondary | ICD-10-CM | POA: Diagnosis not present

## 2015-05-09 DIAGNOSIS — N183 Chronic kidney disease, stage 3 unspecified: Secondary | ICD-10-CM

## 2015-05-09 DIAGNOSIS — K2270 Barrett's esophagus without dysplasia: Secondary | ICD-10-CM

## 2015-05-09 DIAGNOSIS — G990 Autonomic neuropathy in diseases classified elsewhere: Secondary | ICD-10-CM | POA: Diagnosis not present

## 2015-05-09 DIAGNOSIS — I81 Portal vein thrombosis: Secondary | ICD-10-CM | POA: Diagnosis not present

## 2015-05-09 LAB — COMPREHENSIVE METABOLIC PANEL
ALK PHOS: 37 U/L — AB (ref 39–117)
ALT: 25 U/L (ref 0–53)
AST: 34 U/L (ref 0–37)
Albumin: 3.7 g/dL (ref 3.5–5.2)
BILIRUBIN TOTAL: 0.3 mg/dL (ref 0.2–1.2)
BUN: 52 mg/dL — ABNORMAL HIGH (ref 6–23)
CO2: 25 mEq/L (ref 19–32)
CREATININE: 1.99 mg/dL — AB (ref 0.50–1.35)
Calcium: 9.4 mg/dL (ref 8.4–10.5)
Chloride: 105 mEq/L (ref 96–112)
Glucose, Bld: 136 mg/dL — ABNORMAL HIGH (ref 70–99)
Potassium: 6.3 mEq/L (ref 3.5–5.3)
Sodium: 139 mEq/L (ref 135–145)
Total Protein: 6.8 g/dL (ref 6.0–8.3)

## 2015-05-09 MED ORDER — OMEPRAZOLE 20 MG PO CPDR: 20.0000 mg | DELAYED_RELEASE_CAPSULE | Freq: Every day | ORAL | Status: DC

## 2015-05-09 NOTE — Progress Notes (Signed)
  Patient name: Philip Matthews MRN [id number]  Date of birth: 22-Jan-1928  CC & HPI:  Philip Matthews is a 81 y.o. male presenting today for diabetes, right hand pain and medication questions.   DIABETES   Blood Sugar Ranges: 110-160  Symptoms of Hypoglycemia? no  Comorbid Symptoms: no Chest pain; no SOB; no Neuropathy: no Vision problems  Medication Compliance: yes, Lantus 7 u daily   Medication Side Effects: denies  Hx of GI bleed - He reports history of GI bleed requiring surgery.  After that, he was started on PPI which he has stopped taking as he no longer has symptoms of GERD.  We discussed risk and benefits of chronic PPI versus stopping PPI with possibility of bleed while on coumadin.  - He opted to continue PPI  Right hand pain - He reports right hand pain mainly in his MCP joints of his second, third and fourth fingers for the past several weeks.  He reports his pain is chronic in nature, but may have worsened recently.  He associates this with some of his exercises that he does.  He is unable to recall if the pain is worsened since stopping uloric.  He denies any redness or warmth.  Has any trauma.   ROS: See HPI   Medical & Surgical Hx:  Reviewed  Medications & Allergies: Reviewed  Social History: Reviewed:   Objective Findings:  Vitals: BP 139/69 mmHg  Pulse 75  Temp(Src) 98.6 F (37 C) (Oral)  Wt 136 lb 1 oz (61.718 kg)  Gen: NAD CV: RRR w/o m/r/g, pulses +2 b/l Resp: CTAB w/ normal respiratory effort Lower Ext: No skin changes; 1+ edema b/l; distal pulses intact; Calves nontender Right hand: Bony enlargement of second, third and fourth MCP joint w/o erythema, warm  Assessment & Plan:   Please See Problem Focused Assessment & Plan

## 2015-05-09 NOTE — Patient Instructions (Signed)
It was great seeing you today.   I have order some labs today to check your kidney function. I will send you a letter with the results, or call you if we need to make any changes to your current therapies.  Take tylenol for you hand pain. Call me if you would like to restart Uloric.  Continue taking Lantus 7 units daily. Call me if your blood sugar is consistently below 100 or above 200   Please bring all your medications to every doctors visit  Sign up for My Chart to have easy access to your labs results, and communication with your Primary care physician.  Next Appointment  Please call to make an appointment with Dr Berkley Harvey in July   I look forward to talking with you again at our next visit. If you have any questions or concerns before then, please call the clinic at [phone].  Take Care,   Dr Phill Myron

## 2015-05-09 NOTE — Assessment & Plan Note (Signed)
Dully achy pain in right MCP joints. Joint are enlarged in both hand without signs of inflammation - likely OA vs muscle soreness due to exercises, however he notes some possible worsening since stopping Uloric for gout. Not consistent with previous gout attacks - recommend Tylenol 500mg  TID prn for 1-2 weeks - He will call if Tylenol not working or pain worsening, could try restarting Uloric at that time for possible chronic gout

## 2015-05-09 NOTE — Assessment & Plan Note (Signed)
Last K elevated  - recheck BMET

## 2015-05-09 NOTE — Assessment & Plan Note (Signed)
Discussed PPI use which he has used in the past but stop. Due to Hx of GI ulcer/bleed requiring surgery and anticoagulation therapy recommended continuing PPI as ulcer prophylaxis

## 2015-05-09 NOTE — Assessment & Plan Note (Addendum)
BGs have been well controlled range 117-160s. He reports better understanding of DM diet since Nutrition apt and has second apt scheduled - Continue Lantus 7 units daily - Discuss decreasing BG checks to couple times a week or for symptoms or hypoglycemia - f/u in July for A1c recheck

## 2015-05-10 ENCOUNTER — Telehealth: Payer: Self-pay | Admitting: Family Medicine

## 2015-05-10 NOTE — Telephone Encounter (Signed)
Spoke with daughter using sign language interpreter.  Patient has not been taking this medication as directed.  She states that they have missed a couple of doses but will plan to take it 3 times a week.  Informed her that I would let provider know to see if patient still needs to contact nephrology. Jazmin Hartsell,CMA

## 2015-05-10 NOTE — Telephone Encounter (Signed)
Called to discuss his elevated potassium from yesterday. If he calls back please confirm that he is taking his Kayexalate 3 x a week; if so he needs to call his nephrologist and let them know his potassium was elevated @ 6.3. This is not an urgent matter, but he should discuss this with his nephrologist today and see if they need to see him sooner or increase his Kayexalate. If he would like to speak to me please find out a good time / # for me to call him back and page me at [phone].   Thanks,   [employer]

## 2015-05-10 NOTE — Telephone Encounter (Signed)
[employer] customer service called a critical value to Philip Matthews office at 8:40 am on 05-10-15   K = 6.3   AMION page sent to MD

## 2015-05-10 NOTE — Telephone Encounter (Signed)
He should take the Kayexalate and make a pharmacy apt to recheck his potassium. He can do this with [place] or his renal doctor.

## 2015-05-12 NOTE — Telephone Encounter (Signed)
Spoke with pt's daughter using sign language interpreter and advised as directed below and signed understanding. Daughter stated that pt is almost out of this med (Kayexalate) and was prescribed by kidney doctor. I advised her to contact kidney doctor for refills and she signed understanding. Krupa Stege, CMA.

## 2015-05-15 ENCOUNTER — Other Ambulatory Visit: Payer: Self-pay | Admitting: *Deleted

## 2015-05-15 DIAGNOSIS — E782 Mixed hyperlipidemia: Secondary | ICD-10-CM

## 2015-05-15 MED ORDER — PRAVASTATIN SODIUM 40 MG PO TABS: 40.0000 mg | ORAL_TABLET | Freq: Every day | ORAL | Status: DC

## 2015-05-16 ENCOUNTER — Telehealth: Payer: Self-pay | Admitting: Family Medicine

## 2015-05-16 NOTE — Telephone Encounter (Signed)
Will forward to MD to give verbal orders. Daphnee Preiss,CMA

## 2015-05-16 NOTE — Telephone Encounter (Signed)
Approved for re-certification and continued patient visits.

## 2015-05-16 NOTE — Telephone Encounter (Signed)
Spoke with Olivia Mackie and verbal order given. Jazmin Hartsell,CMA

## 2015-05-16 NOTE — Telephone Encounter (Signed)
Olivia Mackie from [facility] called and would like verbal order for one extra day to do re certification on the patient and to also continue the patient visits. Please call Olivia Mackie at [phone]. Blima Rich

## 2015-05-17 ENCOUNTER — Telehealth: Payer: Self-pay | Admitting: Family Medicine

## 2015-05-17 NOTE — Telephone Encounter (Signed)
Philip Matthews, nurse with [facility], called with INR report:   Pt is currently taking warfarin - 5 mg daily  Per the nurse, pt is not having any bleeding, bruising, no missed doses or extra doses, no Hospital or ED visits, no diet or medication changes although he did eat cabbage last night and Dr. Berkley Harvey says Philip Matthews is working with him on nutrition, no other contraindications.   Protime seconds = 19.0   INR = 1.6  Advised nurse to have pt take warfarin 7.5 mg - Wed only, 5 mg - other days of week Nurse repeats correct dose back to me.  recheck in 2 weeks  Dosed per Dr. Mingo Amber

## 2015-05-18 ENCOUNTER — Other Ambulatory Visit: Payer: Self-pay | Admitting: Family Medicine

## 2015-05-18 NOTE — Telephone Encounter (Signed)
Refill request from pharmacy. Will forward to PCP for review. Myquan Schaumburg, CMA. 

## 2015-05-23 ENCOUNTER — Encounter: Payer: Self-pay | Admitting: Family Medicine

## 2015-05-23 ENCOUNTER — Ambulatory Visit (INDEPENDENT_AMBULATORY_CARE_PROVIDER_SITE_OTHER): Payer: Medicare Other | Admitting: Family Medicine

## 2015-05-23 VITALS — Ht 60.0 in | Wt 138.0 lb

## 2015-05-23 DIAGNOSIS — G990 Autonomic neuropathy in diseases classified elsewhere: Secondary | ICD-10-CM | POA: Diagnosis not present

## 2015-05-23 DIAGNOSIS — E1142 Type 2 diabetes mellitus with diabetic polyneuropathy: Secondary | ICD-10-CM

## 2015-05-23 DIAGNOSIS — E1143 Type 2 diabetes mellitus with diabetic autonomic (poly)neuropathy: Secondary | ICD-10-CM

## 2015-05-23 NOTE — Patient Instructions (Addendum)
Diet Recommendations for Diabetes   Food is made up of PROTEIN, CARBOHYDRATE, and FAT. These food components give [place] energy (fuel). When you eat, you need insulin to allow fuel to get into the cells of your body. Your most abundant fuel is carbohydrate.   CARBOHYDRATE includes starch, sugar, and fiber. Starch and sugar raise your blood sugar and provide calories to you, while fiber does not do either.   Starchy (carb) foods: Bread, rice, pasta, potatoes, corn, crackers, bagels, muffins, all baked goods. (Fruits, milk, and yogurt also have carbohydrate, but most of these foods will not spike your blood sugar as the starchy foods will.) A few fruits do cause high blood sugars; use small portions of bananas (limit to 1/2 at a time), grapes, watermelon, and most tropical fruits.   Protein foods: Meat, fish, poultry, eggs, dairy foods, and beans such as pinto and kidney beans (beans also provide carbohydrate).   1. Eat at least 3 meals and 1-2 snacks per day. Never go more than 4-5 hours while awake without eating. Eat breakfast within the first hour of getting up.  2. Limit starchy foods to TWO per meal and ONE per snack. ONE portion of a starchy food is equal to the following:  - ONE slice of bread (or its equivalent, such as half of a hamburger bun).  - 1/2 cup of a "scoopable" starchy food such as potatoes or rice.  - 15 grams of carbohydrate as shown on food label.  3. Include at every meal: a protein food, a carb food, and vegetables and/or fruit.  - Fresh or frozen veg's are best.  - Keep frozen veg's on hand for a quick vegetable serving.   If you want an ice cream treat once in a while, look for a brand of ice cream (or ice cream sandwich) that has no more than 30 grams of carbohydrate.

## 2015-05-23 NOTE — Progress Notes (Signed)
Medical Nutrition Therapy:  Appt start time: 1100 end time:  1200. Daughter: Cassandra  Assessment:  Primary concerns today: Blood sugar control.  Mr. Shewell was accompanied by his daughter Vito Backers, and we also had a sign language interpreter with [place].  Mr. Matsuo said he is now eating about the same amount of veg's - a small amount about daily, which need to be limited b/c of his warfarin Rx.  He apparently was unable to read any of the printout from his previous nutrition appt b/c I found out only today that he cannot read normal-size print.  Consequently, he did not remember/understand the need to limit CHO at each meal, and he did not know which foods provide carbohydrate.  We reviewed this today, and I printed this information in his AVS in a large font.  He asked about eating ice cream in the hot weather, and his daughter Vito Backers demonstrated a good understanding of needing to make choices, i.e., if he wants ice cream, then he needs to forego bread with dinner.  I pointed out that while he can have ice cream occasionally, it is an occasional treat rather than an everyday food.    Mr. Sahlin brought his FBG log, which shows a range of 110-130 for the past couple of weeks, with most in the 120s.    24-hr recall:  (Up at 7 AM) B (7:30 AM)-  1 boiled egg, 2 slc bacon, 1 slc bread, water Snk (10 AM)-  1 c coffee, 1 tsp sugar L ( PM)-  Can't remember Snk ( PM)-  1/2 c fruit cocktail D ( PM)-  4 oz steak, yellow squash, some green veg, water Snk ( PM)-  2 PB crackers Typical day? Yes.    Progress Towards Goal(s):  In progress.   Nutritional Diagnosis:  No progress noted on: NB-1.1 Food and nutrition-related knowledge deficit As related to BG control.  As evidenced by patient's expressed non-understanding of effects of dietary carb on BG and food sources.    Intervention:  Nutrition education.  Handouts given during visit include:  AVS  Demonstrated degree of understanding via:  Teach Back    Monitoring/Evaluation:  Dietary intake, exercise, FBG, and body weight in 6 week(s).  No appt available sooner.

## 2015-05-31 DIAGNOSIS — N183 Chronic kidney disease, stage 3 (moderate): Secondary | ICD-10-CM | POA: Diagnosis not present

## 2015-05-31 LAB — CBC AND DIFFERENTIAL
HCT: 32.7
HEMOGLOBIN (KUC): 10.2 g/dL — AB (ref 13–17)
MCV: 81.8
Platelet: 140
RDW: 15.8 % — AB (ref 11.6–14)
WBC: 5.9

## 2015-06-01 ENCOUNTER — Telehealth: Payer: Self-pay | Admitting: *Deleted

## 2015-06-01 NOTE — Telephone Encounter (Signed)
[facility] called in INR results on Mr Mcshan on Wednesday 05/31/2015. INR 1.9; 23.0 sec. His current dose is 7.5 mg on Wednesday's and 5 mg the rest of the week. Advised HH nurse to increase his coumadin dose to 7.5 mg on Wednesday's and Sunday's and 5 mg the rest of the week per Dr Jerline Pain. Recheck in 2 weeks.Busick, Kevin Fenton

## 2015-06-06 ENCOUNTER — Encounter: Payer: Self-pay | Admitting: Family Medicine

## 2015-06-14 ENCOUNTER — Other Ambulatory Visit: Payer: Self-pay | Admitting: Family Medicine

## 2015-06-14 ENCOUNTER — Telehealth: Payer: Self-pay | Admitting: *Deleted

## 2015-06-14 DIAGNOSIS — N183 Chronic kidney disease, stage 3 (moderate): Secondary | ICD-10-CM | POA: Diagnosis not present

## 2015-06-14 NOTE — Telephone Encounter (Signed)
Olivia Mackie with [facility] called in INR results for Mr Grzywacz done today. INR 2.1, 25.5 sec. No bruising or bleeding. No changes in medication or diet. No antibiotic use. No recent trips to the ED. His current dose is 7.5 mg on Wed and Sun, and 5 mg the rest of the week. Advised HH nurse to keep him on the same dose and to recheck in 2 weeks.Busick, Kevin Fenton

## 2015-06-19 ENCOUNTER — Encounter: Payer: Self-pay | Admitting: Family Medicine

## 2015-06-19 LAB — CBC AND DIFFERENTIAL
HEMATOCRIT: 32 %
Hemoglobin: 9.9 g/dL — AB (ref 13–17)
MCV: 83.1 fL (ref 80–98)
PLATELET COUNT (KUC): 147 10*3/uL (ref 140–400)
RDW: 15.6 % — AB (ref 11.6–14)
WBC: 3.85

## 2015-06-21 DIAGNOSIS — D509 Iron deficiency anemia, unspecified: Secondary | ICD-10-CM | POA: Diagnosis not present

## 2015-06-28 ENCOUNTER — Telehealth: Payer: Self-pay | Admitting: *Deleted

## 2015-06-28 DIAGNOSIS — N183 Chronic kidney disease, stage 3 (moderate): Secondary | ICD-10-CM | POA: Diagnosis not present

## 2015-06-28 NOTE — Telephone Encounter (Signed)
Received call from [facility], patient's INR today is 1.6. She reports he has eaten greens everyday for a week. She says he is going back to his normal diet of eating greens twice weekly. No other changes to report. Advised to keep patient on same dose of 7.5 mg Wed, Sun and 5 mg the rest of the days of the week. Asked her to recheck in 1 week.Busick, Kevin Fenton

## 2015-07-05 ENCOUNTER — Telehealth: Payer: Self-pay | Admitting: *Deleted

## 2015-07-05 NOTE — Telephone Encounter (Signed)
[facility] nurse calling in INR results on Mr Herne done today. INR was 2.2, 26.2 seconds. No changes in diet or medications. Not taking antibiotics. No bruising or bleeding. And no recent trips to the ER. Advised nurse to keep him on the same dose of 7.5 mg on Wednesdays, Sundays and 5 mg the rest of the days of the week. Recheck in 2 weeks. Caylyn Tedeschi, Kevin Fenton

## 2015-07-11 ENCOUNTER — Ambulatory Visit: Payer: Medicare Other | Admitting: Family Medicine

## 2015-07-12 DIAGNOSIS — D509 Iron deficiency anemia, unspecified: Secondary | ICD-10-CM | POA: Diagnosis not present

## 2015-07-12 DIAGNOSIS — N183 Chronic kidney disease, stage 3 (moderate): Secondary | ICD-10-CM | POA: Diagnosis not present

## 2015-07-18 ENCOUNTER — Telehealth: Payer: Self-pay | Admitting: Family Medicine

## 2015-07-18 NOTE — Telephone Encounter (Signed)
[facility]: Needs verbal order to expand currnent service. Certification ends on Sunday Contact is tracy

## 2015-07-18 NOTE — Telephone Encounter (Signed)
Will forward to MD. Jazmin Hartsell,CMA  

## 2015-07-19 ENCOUNTER — Telehealth: Payer: Self-pay | Admitting: *Deleted

## 2015-07-19 NOTE — Telephone Encounter (Signed)
[facility] called in INR result for Mr Maniscalco. INR 1.8 and 21.3 sec. [place] nurse says he has been eating a little more greens than usual. No changes in medication, no antibiotic use, no bruising or bleeding and no recent visits to the ER. He is currently taking 7.5 mg on Sun, Wed and 5 mg the rest of the week. He has been slightly on the thick side the last several checks so Per Dr Cindra Presume instructions we will increase his dose to 7.5 mg on Wed, Fri, Sun and 5 mg the rest of the days of the week and check in two weeks. [place] nurse repeated dosing instructions back.Busick, Kevin Fenton

## 2015-07-19 NOTE — Telephone Encounter (Signed)
I approve continued [facility] services. Please call them to notify.

## 2015-07-19 NOTE — Telephone Encounter (Signed)
Number listed for tracy was incorrect. Spoke with [facility] and this order needs to be faxed to them.  [facility] fax is [phone]. Jazmin Hartsell,CMA

## 2015-07-20 ENCOUNTER — Encounter: Payer: Self-pay | Admitting: Podiatry

## 2015-07-20 ENCOUNTER — Ambulatory Visit (INDEPENDENT_AMBULATORY_CARE_PROVIDER_SITE_OTHER): Payer: Medicare Other | Admitting: Podiatry

## 2015-07-20 DIAGNOSIS — M79676 Pain in unspecified toe(s): Secondary | ICD-10-CM | POA: Diagnosis not present

## 2015-07-20 DIAGNOSIS — B351 Tinea unguium: Secondary | ICD-10-CM | POA: Diagnosis not present

## 2015-07-20 DIAGNOSIS — Q828 Other specified congenital malformations of skin: Secondary | ICD-10-CM

## 2015-07-20 DIAGNOSIS — E1140 Type 2 diabetes mellitus with diabetic neuropathy, unspecified: Secondary | ICD-10-CM

## 2015-07-20 NOTE — Telephone Encounter (Signed)
Called [facility].  They will accept verbal order to continue services.

## 2015-07-20 NOTE — Progress Notes (Signed)
Patient ID: Philip Matthews, male   DOB: 07-20-1928, 81 y.o.   MRN: [id number] Complaint:  Visit Type: Patient returns to my office for continued preventative foot care services. Complaint: Patient states" my nails have grown long and thick and become painful to walk and wear shoes" Patient has been diagnosed with DM with neuropathy. The patient presents for preventative foot care services. No changes to ROS  Podiatric Exam: Vascular: dorsalis pedis and posterior tibial pulses are palpable bilateral. Capillary return is immediate. Temperature gradient is WNL. Skin turgor WNL  Sensorium: Normal Semmes Weinstein monofilament test. Normal tactile sensation bilaterally. Nail Exam: Pt has thick disfigured discolored nails with subungual debris noted bilateral entire nail hallux through fifth toenails Ulcer Exam: There is no evidence of ulcer or pre-ulcerative changes or infection. Orthopedic Exam: Muscle tone and strength are WNL. No limitations in general ROM. No crepitus or effusions noted. Foot type and digits show no abnormalities. Bony prominences are unremarkable. Skin:  Porokeratosis sub 5th met . No infection or ulcers  Diagnosis:  Onychomycosis, , Pain in right toe, pain in left toes, porokeratosis  Treatment & Plan Procedures and Treatment: Consent by patient was obtained for treatment procedures. The patient understood the discussion of treatment and procedures well. All questions were answered thoroughly reviewed. Debridement of mycotic and hypertrophic toenails, 1 through 5 bilateral and clearing of subungual debris. No ulceration, no infection noted. Debride porokeratosis Return Visit-Office Procedure: Patient instructed to return to the office for a follow up visit 3 months for continued evaluation and treatment.

## 2015-08-02 ENCOUNTER — Other Ambulatory Visit: Payer: Self-pay | Admitting: Family Medicine

## 2015-08-02 DIAGNOSIS — D509 Iron deficiency anemia, unspecified: Secondary | ICD-10-CM | POA: Diagnosis not present

## 2015-08-02 DIAGNOSIS — N183 Chronic kidney disease, stage 3 (moderate): Secondary | ICD-10-CM | POA: Diagnosis not present

## 2015-08-03 ENCOUNTER — Telehealth: Payer: Self-pay | Admitting: *Deleted

## 2015-08-03 NOTE — Telephone Encounter (Signed)
Received phone call yesterday from [facility] with [facility] with INR result on Philip Matthews. INR 2.5 and 30.1 sec. No changes in Diet or medications. No antibiotic use. No bruising or bleeding. No recent ED visits. Patient is currently taking 7.5 mg Wed, Fri, Sun; and 5 mg on Mon, Tues, Thurs, and Sat. Instructed home health nurse to keep him on the same dose and to recheck in 2 weeks. She repeated back dosing instructions.Busick, Kevin Fenton

## 2015-08-07 ENCOUNTER — Encounter: Payer: Self-pay | Admitting: Family Medicine

## 2015-08-07 LAB — CBC AND DIFFERENTIAL
HCT: 30 %
HGB: 9.6 g/dL
MCV: 82.8 fL
Platelets: 133 10*3/uL
WBC: 5.7

## 2015-08-16 ENCOUNTER — Telehealth: Payer: Self-pay | Admitting: *Deleted

## 2015-08-16 DIAGNOSIS — N183 Chronic kidney disease, stage 3 (moderate): Secondary | ICD-10-CM | POA: Diagnosis not present

## 2015-08-16 NOTE — Telephone Encounter (Signed)
[facility] nurse called in INR results of 3.0 for Philip Matthews. No bruising or bleeding. No changes in medications or diet. No antibiotic use. No ER visits. Advised [facility] nurse to keep Philip Matthews on the same dose of 7.5 mg on Wed, Fri, Sun and 5 mg on Mon, Tues, Thurs, Sat and to recheck in 1 week per Dr Cindra Presume instructions.Levan Aloia, Kevin Fenton

## 2015-08-23 ENCOUNTER — Telehealth: Payer: Self-pay | Admitting: *Deleted

## 2015-08-23 NOTE — Telephone Encounter (Signed)
Received call from [facility] with [facility] with INR result of 2.5 on Philip Matthews. No changes in medications or diet. No antibiotic use. No bruising or bleeding. No recent ED visits. Advised to keep him on the same dose of 7.5 mg Sun, Wed, Fri and 5 mg Mon, Tues, Thurs, Sat. They are going back out next week and will recheck him at that time. If he is therapeutic at that visit, schedule the next follow up for 2 weeks.Anderson Middlebrooks, Kevin Fenton

## 2015-08-24 ENCOUNTER — Encounter: Payer: Self-pay | Admitting: Family Medicine

## 2015-08-24 LAB — CBC AND DIFFERENTIAL
HCT: 29.5
HGB: 9 g/dL
MCV: 84.3
Platelet: 141
RDW: 15.9
WBC: 4.8

## 2015-08-30 ENCOUNTER — Other Ambulatory Visit: Payer: Self-pay | Admitting: Family Medicine

## 2015-08-30 DIAGNOSIS — N183 Chronic kidney disease, stage 3 (moderate): Secondary | ICD-10-CM | POA: Diagnosis not present

## 2015-08-31 ENCOUNTER — Telehealth: Payer: Self-pay | Admitting: *Deleted

## 2015-08-31 NOTE — Telephone Encounter (Signed)
Received call from [facility] yesterday with INR result of 2.3 for Mr Mohr. No bruising or bleeding. No changes in medications or diet. No antibiotic use. No recent hospital visits. He is currently taking 7.5 mg on Sun, Wed, Fri and 5 mg Mon, Tues, Thurs, Sat. Advised [facility] nurse to keep him on same dose and recheck in 2 weeks. Busick, Kevin Fenton

## 2015-09-13 ENCOUNTER — Telehealth: Payer: Self-pay | Admitting: *Deleted

## 2015-09-13 ENCOUNTER — Telehealth: Payer: Self-pay | Admitting: Family Medicine

## 2015-09-13 DIAGNOSIS — N183 Chronic kidney disease, stage 3 (moderate): Secondary | ICD-10-CM | POA: Diagnosis not present

## 2015-09-13 NOTE — Telephone Encounter (Signed)
Olivia Mackie the home health nurse called and would like verbal order to re-certify the patient for another 60 days. Also the patient needs a refill on his Calcitriol called in. Olivia Mackie also said that the patient has been having left shoulder pain and does take tylenol for this but it really does not help. Please call Olivia Mackie with verbal orders and if you have any questions [phone]. Blima Rich

## 2015-09-13 NOTE — Telephone Encounter (Signed)
Olivia Mackie with [facility] called in INR of 2.9 on Mr Gladu today. No bruising or bleeding. No changes in medications or diet. No missed or extra doses. No recent hospitalizations. He is currently taking 7.5 mg on Wed, Fri, Sun and 5 mg Mon, Tues, Thurs, Sat. Roena Malady to keep him on the same dose and recheck in 2 weeks. She repeated back dosing instructions. Busick, Kevin Fenton

## 2015-09-13 NOTE — Telephone Encounter (Signed)
Will forward to MD. Baleria Wyman,CMA  

## 2015-09-14 NOTE — Telephone Encounter (Signed)
Spoke with Olivia Mackie and gave her the verbal ok to recertify.  Also informed her of medication refill.  She would like to know if patient is able to try anything else for his shoulder pain.  Link Burgeson,CMA

## 2015-09-14 NOTE — Telephone Encounter (Signed)
Yes to verbal order to re-certify the patient for another 60 days. Also please call patient: His calcitriol should be refilled / managed by his kidney doctor.

## 2015-09-14 NOTE — Telephone Encounter (Signed)
LM for tracy with [facility] for patient to call us to make an appt sooner than 09-28-15 if unable to wait until then. Armstrong Creasy,CMA

## 2015-09-14 NOTE — Telephone Encounter (Signed)
He would need an appointment for evaluation since he in unable to take NSAIDs due to his CKD.

## 2015-09-27 ENCOUNTER — Telehealth: Payer: Self-pay | Admitting: *Deleted

## 2015-09-27 DIAGNOSIS — N183 Chronic kidney disease, stage 3 (moderate): Secondary | ICD-10-CM | POA: Diagnosis not present

## 2015-09-27 DIAGNOSIS — N2581 Secondary hyperparathyroidism of renal origin: Secondary | ICD-10-CM | POA: Diagnosis not present

## 2015-09-27 NOTE — Telephone Encounter (Signed)
Philip Matthews with [facility] called in INR result of 4.1 for Philip Matthews. No missed or extras doses of coumadin. No changes in medications or diet. No bruising or bleeding. No recent hospitalizations. Patient is currently taking 7.5 mg Wed, Fri, Sun and 5 mg the rest of the week. Advised HH nurse to have his coumadin today and tomorrow then resume regular schedule and recheck in 1 week per Dr Gwendlyn Deutscher.Busick, Kevin Fenton

## 2015-09-28 ENCOUNTER — Encounter: Payer: Self-pay | Admitting: Family Medicine

## 2015-09-28 ENCOUNTER — Ambulatory Visit (INDEPENDENT_AMBULATORY_CARE_PROVIDER_SITE_OTHER): Payer: Medicare Other | Admitting: Family Medicine

## 2015-09-28 VITALS — BP 177/81 | HR 75 | Temp 97.9°F | Ht 60.0 in | Wt 135.0 lb

## 2015-09-28 DIAGNOSIS — E1142 Type 2 diabetes mellitus with diabetic polyneuropathy: Secondary | ICD-10-CM

## 2015-09-28 DIAGNOSIS — G990 Autonomic neuropathy in diseases classified elsewhere: Secondary | ICD-10-CM

## 2015-09-28 DIAGNOSIS — M79641 Pain in right hand: Secondary | ICD-10-CM

## 2015-09-28 DIAGNOSIS — Z23 Encounter for immunization: Secondary | ICD-10-CM

## 2015-09-28 DIAGNOSIS — M25512 Pain in left shoulder: Secondary | ICD-10-CM

## 2015-09-28 DIAGNOSIS — E1143 Type 2 diabetes mellitus with diabetic autonomic (poly)neuropathy: Secondary | ICD-10-CM

## 2015-09-28 NOTE — Assessment & Plan Note (Signed)
Likely multifactorial due to arthritis and rotator cuff tendinopathy due to many years has shoe shiner.  Extremely limited range of motion - Followed by orthopedics, but has been told he is not a surgical candidate - Subacromial steroids injection today - Increase Tylenol 1000 mg 3 times a day  - f/u in 2-3 weeks for recheck

## 2015-09-28 NOTE — Assessment & Plan Note (Signed)
Pain not improved with Tylenol 500 mg 3 times a day - Increase Tylenol to 1000 mg 3 times a day when necessary - Follow-up in 2 weeks; consider restarting Uloric if pain not improved

## 2015-09-28 NOTE — Patient Instructions (Signed)
It was great seeing you today.   1. You can take 1000 mg of Tylenol up to 3 times a day.  2. Ice your right shoulder for 10-15 minutes when you get home and again prior to going to bed  3.  I will check your A1c today and discuss this with her come back in 2 weeks   Please bring all your medications to every doctors visit  Sign up for My Chart to have easy access to your labs results, and communication with your Primary care physician.  Next Appointment  Please make an appointment with Dr Berkley Harvey in 2 weeks for shoulder pain   I look forward to talking with you again at our next visit. If you have any questions or concerns before then, please call the clinic at [phone].  Take Care,   Dr Phill Myron

## 2015-09-28 NOTE — Assessment & Plan Note (Signed)
BGs well controlled: 80-130s - Follow-up in 2 weeks during shoulder recheck and will get A1c at that time - Continue Lantus 7u daily

## 2015-09-28 NOTE — Progress Notes (Signed)
  Patient name: Philip Matthews MRN [id number]  Date of birth: 09/26/1928  CC & HPI:  Philip Matthews is a 81 y.o. male presenting today for DM, right hand and left shoulder pain.   DIABETES   Blood Sugar Ranges: 80-130  Symptoms of Hypoglycemia? 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) - Has been taking 500 mg Tylenol 3 times a day with no relief  Left shoulder - Reports pain and stiffness and left shoulder - He is has seen orthopedist, who tells him he is not eligible for surgery due to age - Extremely limited range of motion - Has been taking 500 mg Tylenol 3 times a day regularly  ROS: See HPI   Medical & Surgical Hx:  Reviewed  Medications & Allergies: Reviewed  Social History: Reviewed:   Objective Findings:  Vitals: BP 177/81 mmHg  Pulse 75  Temp(Src) 97.9 F (36.6 C) (Oral)  Ht 5' (1.524 m)  Wt 135 lb (61.236 kg)  BMI 26.37 kg/m2  Gen: NAD R Hand: Bony enlargement of MCP, PIP and DIP joints. No erythema L Shoulder: Inspection: No erythema or bruising Palpation: Tenderness over lateral shoulder ROM: Extremely limited, abduction 80, external rotation 20 Positive empty can sign. Biceps tendon: Speeds tests normal  Assessment & Plan:   Type II diabetes mellitus, controlled but with peripheral neuropathy BGs well controlled: 80-130s - Follow-up in 2 weeks during shoulder recheck and will get A1c at that time - Continue Lantus 7u daily  Right hand pain Pain not improved with Tylenol 500 mg 3 times a day - Increase Tylenol to 1000 mg 3 times a day when necessary - Follow-up in 2 weeks; consider restarting Uloric if pain not improved  Left shoulder pain Likely multifactorial due to arthritis and rotator cuff tendinopathy due to many years has shoe shiner.  Extremely limited  range of motion - Followed by orthopedics, but has been told he is not a surgical candidate - Subacromial steroids injection today - Increase Tylenol 1000 mg 3 times a day  - f/u in 2-3 weeks for recheck    Consent obtained and verified. Sterile alcohol prep Topical analgesic spray: Ethyl chloride. Joint: Left subacromial Approached in typical fashion with: Completed without difficulty Meds:  Solu Medrol 1 cc; lidocaine 3 cc Needle: 25 g Aftercare instructions and Red flags advised.

## 2015-09-29 ENCOUNTER — Telehealth: Payer: Self-pay | Admitting: Family Medicine

## 2015-09-29 DIAGNOSIS — N2581 Secondary hyperparathyroidism of renal origin: Secondary | ICD-10-CM | POA: Diagnosis not present

## 2015-09-29 DIAGNOSIS — N183 Chronic kidney disease, stage 3 (moderate): Secondary | ICD-10-CM | POA: Diagnosis not present

## 2015-09-29 NOTE — Telephone Encounter (Signed)
Nothing for clinic staff to complete.  Form placed in providers box. Samie Barclift,CMA

## 2015-09-29 NOTE — Telephone Encounter (Signed)
Cassandra dropped off [employer] forms to be filled out, forgot to give to Dr. Berkley Harvey yesterday when he was here. Given to blue team for any clinical completion.

## 2015-10-04 ENCOUNTER — Telehealth: Payer: Self-pay | Admitting: *Deleted

## 2015-10-04 ENCOUNTER — Encounter: Payer: Self-pay | Admitting: Family Medicine

## 2015-10-04 NOTE — Telephone Encounter (Signed)
Left message via sign language interpreter that [employer] forms are complete and ready for pick up.  Derl Barrow, RN

## 2015-10-04 NOTE — Telephone Encounter (Signed)
[facility] called in INR result of 2.2 today on Mr Maddocks. No bruising or bleeding, no changes in medications or diet, no antibiotic use, and no recent ED visits. Patient is currently taking 7.5 mg on Wed, Fri, Sun and 5 mg Mon, Tues, Thurs, Sat. Advised nurse to keep him on same dose and recheck in 1 week at next home visit since he was at 4.1 last week. She repeated dosing and follow up instructions. Keynan Heffern, Kevin Fenton

## 2015-10-05 DIAGNOSIS — I129 Hypertensive chronic kidney disease with stage 1 through stage 4 chronic kidney disease, or unspecified chronic kidney disease: Secondary | ICD-10-CM | POA: Diagnosis not present

## 2015-10-05 DIAGNOSIS — N2581 Secondary hyperparathyroidism of renal origin: Secondary | ICD-10-CM | POA: Diagnosis not present

## 2015-10-05 DIAGNOSIS — D631 Anemia in chronic kidney disease: Secondary | ICD-10-CM | POA: Diagnosis not present

## 2015-10-05 DIAGNOSIS — N183 Chronic kidney disease, stage 3 (moderate): Secondary | ICD-10-CM | POA: Diagnosis not present

## 2015-10-11 ENCOUNTER — Telehealth: Payer: Self-pay | Admitting: *Deleted

## 2015-10-11 ENCOUNTER — Encounter: Payer: Self-pay | Admitting: Family Medicine

## 2015-10-11 ENCOUNTER — Other Ambulatory Visit: Payer: Self-pay | Admitting: Family Medicine

## 2015-10-11 DIAGNOSIS — N183 Chronic kidney disease, stage 3 (moderate): Secondary | ICD-10-CM | POA: Diagnosis not present

## 2015-10-11 LAB — CBC
HEMATOCRIT: 30 %
Hemoglobin: 9 g/dL — AB (ref 13–17)
MCV: 85.1
PLATELETS: 170
WBC: 5.7

## 2015-10-11 LAB — RENAL FUNCTION PANEL
Albumin: 3.6
BUN: 39
CO2: 104 mmol/L
CREATININE: 1.67
Calcium: 8.8 mg/dL
MAGNESIUM: 1.8
PHOSPHORUS: 3.3
VIT D 25 HYDROXY: 46

## 2015-10-11 NOTE — Telephone Encounter (Signed)
[facility] calling in INR result of 2.4 today for Philip Matthews. He is currently taking 7.5 mg on Wed, Fri, Sun and 5 mg Mon, Tues, Thurs, Sat. No changes in medications or diet. No bruising or bleeding. No antibiotic use. No recent hospital visits. She did note that the patient bit his tongue and had some bleeding from that but was able to get it to stop. Advised nurse to keep him on the same dose and to recheck in 2 weeks. She repeated back dosing instructions. Philip Matthews, Philip Matthews

## 2015-10-12 ENCOUNTER — Ambulatory Visit (INDEPENDENT_AMBULATORY_CARE_PROVIDER_SITE_OTHER): Payer: Medicare Other | Admitting: Family Medicine

## 2015-10-12 ENCOUNTER — Encounter: Payer: Self-pay | Admitting: Family Medicine

## 2015-10-12 ENCOUNTER — Other Ambulatory Visit: Payer: Self-pay | Admitting: Family Medicine

## 2015-10-12 VITALS — BP 182/72 | HR 72 | Temp 97.9°F | Ht 60.0 in | Wt 133.3 lb

## 2015-10-12 DIAGNOSIS — M109 Gout, unspecified: Secondary | ICD-10-CM | POA: Diagnosis not present

## 2015-10-12 DIAGNOSIS — E1143 Type 2 diabetes mellitus with diabetic autonomic (poly)neuropathy: Secondary | ICD-10-CM | POA: Diagnosis present

## 2015-10-12 DIAGNOSIS — M25512 Pain in left shoulder: Secondary | ICD-10-CM | POA: Diagnosis not present

## 2015-10-12 DIAGNOSIS — M79641 Pain in right hand: Secondary | ICD-10-CM

## 2015-10-12 LAB — POCT GLYCOSYLATED HEMOGLOBIN (HGB A1C): Hemoglobin A1C: 8.3

## 2015-10-12 LAB — URIC ACID: Uric Acid, Serum: 7.9 mg/dL — ABNORMAL HIGH (ref 4.0–7.8)

## 2015-10-12 MED ORDER — COLCHICINE 0.6 MG PO CAPS: 0.3000 mg | ORAL_CAPSULE | Freq: Every day | ORAL | Status: DC

## 2015-10-12 NOTE — Patient Instructions (Signed)
It was great seeing you today.   1. Start taking colchicine 0.3 mg (half pill) daily.  2. Continue taking Tylenol as needed.  Can take 650 mg every 6 hours, or 1000 mg every 8 hours   Please bring all your medications to every doctors visit  Sign up for My Chart to have easy access to your labs results, and communication with your Primary care physician.  Next Appointment  Please make an appointment with Dr Berkley Harvey in 3-4 weeks for joint pains   I look forward to talking with you again at our next visit. If you have any questions or concerns before then, please call the clinic at [phone].  Take Care,   Dr Phill Myron

## 2015-10-13 NOTE — Assessment & Plan Note (Signed)
Likely due to OA but possibly 2/2 gout - Start Colchicine 0.3 mg qd  - Continue tylenol prn - f/u in 3-4  Weeks - consider restarting uloric; awaiting uric acid results

## 2015-10-13 NOTE — Progress Notes (Signed)
  Patient name: Philip Matthews MRN [id number]  Date of birth: 1928/11/29  CC & HPI:  Philip Matthews is a 81 y.o. male presenting today for DM and joint pains.   DM - A1c today improved - no hypoglycemia - reports taking 6u daily  Joint pains - Left shoulder pain improved with steroid injection at last visit, but no resolved - continues to have bilateral pain in hands at PIP and DIP joints  - Hx of gout; he is unsure if his pain got worse after Uloric was stopped  ROS: See HPI   Medical & Surgical Hx:  Reviewed  Medications & Allergies: Reviewed  Social History: Reviewed:   Objective Findings:  Vitals: BP 182/72 mmHg  Pulse 72  Temp(Src) 97.9 F (36.6 C) (Oral)  Ht 5' (1.524 m)  Wt 133 lb 4.8 oz (60.464 kg)  BMI 26.03 kg/m2  Gen: NAD CV: RRR w/o m/r/g, pulses +2 b/l Resp: CTAB w/ normal respiratory effort MSK: Bony enlargement of small joints of bilateral hands w/o erythema or warmth  Assessment & Plan:   Right hand pain Likely due to OA but possibly 2/2 gout - Start Colchicine 0.3 mg qd  - Continue tylenol prn - f/u in 3-4  Weeks - consider restarting uloric; awaiting uric acid results  Left shoulder pain Likely due to OA but possibly 2/2 gout - Start Colchicine 0.3 mg qd  - Continue tylenol prn - f/u in 3-4  Weeks

## 2015-10-13 NOTE — Assessment & Plan Note (Signed)
Likely due to OA but possibly 2/2 gout - Start Colchicine 0.3 mg qd  - Continue tylenol prn - f/u in 3-4  Weeks

## 2015-10-25 ENCOUNTER — Telehealth: Payer: Self-pay | Admitting: *Deleted

## 2015-10-25 ENCOUNTER — Other Ambulatory Visit: Payer: Self-pay | Admitting: Family Medicine

## 2015-10-25 DIAGNOSIS — N183 Chronic kidney disease, stage 3 (moderate): Secondary | ICD-10-CM | POA: Diagnosis not present

## 2015-10-25 NOTE — Telephone Encounter (Signed)
[facility] called in INR result of 4.7 today. He has not been eating any greens lately. No bruising or bleeding, no changes in mediations. Instructed HH nurse to have him hold today's dose then continue regular schedule of 7.5 mg on Wed, Fri, Sun and 5 mg other days of the week. Also asked [facility] nurse to have Mr Kleve eat some greens today and to go back to eating them regularly. Will recheck his INR in 1 week.Busick, Kevin Fenton

## 2015-10-25 NOTE — Telephone Encounter (Signed)
Refill request from pharmacy. Will forward to PCP for review. Amazing Cowman, CMA. 

## 2015-10-31 ENCOUNTER — Encounter: Payer: Self-pay | Admitting: Podiatry

## 2015-10-31 ENCOUNTER — Ambulatory Visit (INDEPENDENT_AMBULATORY_CARE_PROVIDER_SITE_OTHER): Payer: Medicare Other | Admitting: Podiatry

## 2015-10-31 DIAGNOSIS — B351 Tinea unguium: Secondary | ICD-10-CM | POA: Diagnosis not present

## 2015-10-31 DIAGNOSIS — M79676 Pain in unspecified toe(s): Secondary | ICD-10-CM

## 2015-10-31 DIAGNOSIS — Z794 Long term (current) use of insulin: Secondary | ICD-10-CM

## 2015-10-31 DIAGNOSIS — E1140 Type 2 diabetes mellitus with diabetic neuropathy, unspecified: Secondary | ICD-10-CM

## 2015-10-31 DIAGNOSIS — Q828 Other specified congenital malformations of skin: Secondary | ICD-10-CM | POA: Diagnosis not present

## 2015-10-31 NOTE — Progress Notes (Signed)
Patient ID: Philip Matthews, male   DOB: 06/23/1928, 81 y.o.   MRN: [id number] Complaint:  Visit Type: Patient returns to my office for continued preventative foot care services. Complaint: Patient states" my nails have grown long and thick and become painful to walk and wear shoes" Patient has been diagnosed with DM with neuropathy. The patient presents for preventative foot care services. No changes to ROS  Podiatric Exam: Vascular: dorsalis pedis and posterior tibial pulses are palpable bilateral. Capillary return is immediate. Temperature gradient is WNL. Skin turgor WNL  Sensorium: Normal Semmes Weinstein monofilament test. Normal tactile sensation bilaterally. Nail Exam: Pt has thick disfigured discolored nails with subungual debris noted bilateral entire nail hallux through fifth toenails Ulcer Exam: There is no evidence of ulcer or pre-ulcerative changes or infection. Orthopedic Exam: Muscle tone and strength are WNL. No limitations in general ROM. No crepitus or effusions noted. Foot type and digits show no abnormalities. Bony prominences are unremarkable. Skin:  Porokeratosis sub 5th met B/L. Marland Kitchen No infection or ulcers  Diagnosis:  Onychomycosis, , Pain in right toe, pain in left toes, porokeratosis  Treatment & Plan Procedures and Treatment: Consent by patient was obtained for treatment procedures. The patient understood the discussion of treatment and procedures well. All questions were answered thoroughly reviewed. Debridement of mycotic and hypertrophic toenails, 1 through 5 bilateral and clearing of subungual debris. No ulceration, no infection noted. Debride porokeratosis Return Visit-Office Procedure: Patient instructed to return to the office for a follow up visit 3 months for continued evaluation and treatment.

## 2015-11-02 ENCOUNTER — Telehealth: Payer: Self-pay | Admitting: *Deleted

## 2015-11-02 NOTE — Telephone Encounter (Signed)
Received call from [facility] nurse yesterday with INR of 4.0 on Philip Matthews. He was at 4.7 the previous week. Hold x 2 days, then take 7.5 mg on Mon and Fri, 5 mg the rest of the week. No changes in medications or diet. No bruising or bleeding. Previous dose was 3 days of 7.5mg  and the rest 5mg . Dosed by Dr Nori Riis. Ronnald Nian, Kevin Fenton

## 2015-11-08 ENCOUNTER — Other Ambulatory Visit: Payer: Self-pay | Admitting: Family Medicine

## 2015-11-08 DIAGNOSIS — I808 Phlebitis and thrombophlebitis of other sites: Secondary | ICD-10-CM | POA: Diagnosis not present

## 2015-11-08 DIAGNOSIS — M329 Systemic lupus erythematosus, unspecified: Secondary | ICD-10-CM | POA: Diagnosis not present

## 2015-11-08 DIAGNOSIS — E119 Type 2 diabetes mellitus without complications: Secondary | ICD-10-CM | POA: Diagnosis not present

## 2015-11-08 DIAGNOSIS — I129 Hypertensive chronic kidney disease with stage 1 through stage 4 chronic kidney disease, or unspecified chronic kidney disease: Secondary | ICD-10-CM | POA: Diagnosis not present

## 2015-11-08 DIAGNOSIS — Z7901 Long term (current) use of anticoagulants: Secondary | ICD-10-CM | POA: Diagnosis not present

## 2015-11-08 DIAGNOSIS — N183 Chronic kidney disease, stage 3 (moderate): Secondary | ICD-10-CM | POA: Diagnosis not present

## 2015-11-08 DIAGNOSIS — Z5181 Encounter for therapeutic drug level monitoring: Secondary | ICD-10-CM | POA: Diagnosis not present

## 2015-11-08 LAB — POCT INR: INR: 1.7

## 2015-11-14 ENCOUNTER — Ambulatory Visit: Payer: Medicare Other | Admitting: Family Medicine

## 2015-11-14 ENCOUNTER — Other Ambulatory Visit (HOSPITAL_COMMUNITY): Payer: Self-pay | Admitting: *Deleted

## 2015-11-15 ENCOUNTER — Other Ambulatory Visit: Payer: Self-pay | Admitting: Family Medicine

## 2015-11-15 ENCOUNTER — Encounter (HOSPITAL_COMMUNITY): Payer: Medicare Other

## 2015-11-15 LAB — POCT INR: INR: 3.4

## 2015-11-17 ENCOUNTER — Telehealth: Payer: Self-pay | Admitting: Family Medicine

## 2015-11-17 NOTE — Telephone Encounter (Signed)
Tracey from [facility] called and would like verbal orders to see the patient for another 60 days. They do his pill box, blood work for his Nephrologist and skilled nursing for him. Please call [phone] to give orders. jw

## 2015-11-17 NOTE — Telephone Encounter (Signed)
Will forward to MD. Milissa Fesperman,CMA  

## 2015-11-20 ENCOUNTER — Encounter: Payer: Self-pay | Admitting: Family Medicine

## 2015-11-20 ENCOUNTER — Ambulatory Visit (INDEPENDENT_AMBULATORY_CARE_PROVIDER_SITE_OTHER): Payer: Medicare Other | Admitting: Family Medicine

## 2015-11-20 VITALS — BP 200/86 | HR 70 | Temp 97.7°F | Wt 135.1 lb

## 2015-11-20 DIAGNOSIS — M109 Gout, unspecified: Secondary | ICD-10-CM | POA: Diagnosis not present

## 2015-11-20 DIAGNOSIS — E1143 Type 2 diabetes mellitus with diabetic autonomic (poly)neuropathy: Secondary | ICD-10-CM | POA: Diagnosis not present

## 2015-11-20 DIAGNOSIS — I81 Portal vein thrombosis: Secondary | ICD-10-CM

## 2015-11-20 NOTE — Assessment & Plan Note (Signed)
Blood sugars continued to be in excellent control - Continue Lantus 6 units daily - Will follow-up and repeat A1c February

## 2015-11-20 NOTE — Progress Notes (Signed)
  Patient name: Philip Matthews MRN [id number]  Date of birth: 11-27-1928  CC & HPI:  Philip Matthews is a 81 y.o. male presenting today for arthritis, diabetes, anticoagulation.   Diabetes - Blood sugars range 100 - 130 - Denies hypoglycemia - Continues to take Lantus 6 units daily - Denies chest pain, shortness of breath, numbness or tingling in feet  Arthritis - He notes significant improvement in his joint pains after restarting colchicine - Only had to take Tylenol a few days since last visit   Anticoagulation - Has had some variation in INR when checked by home nurse - He has been trying to eat healthier and eating more greens, but has not been doing this on a consistent basis.  Denies any bleeding or bruising  ROS: See HPI   Medical & Surgical Hx:  Reviewed  Medications & Allergies: Reviewed  Social History: Reviewed:   Objective Findings:  Vitals: BP 200/86 mmHg  Pulse 70  Temp(Src) 97.7 F (36.5 C) (Oral)  Wt 135 lb 1 oz (61.264 kg)  Gen: NAD CV: RRR w/o m/r/g, pulses +2 b/l Resp: CTAB w/ normal respiratory effort MSK: Bony enlargement of MCP and PIP joints bilaterally  Assessment & Plan:   Portal vein thrombosis Continue Coumadin and INR checks by home health nurse - Discussed eating greens more consistently  Gout Significant improvement in bilateral shoulder and hand pain after restarting colchicine - Discussed uric acid level 7.9 and possibility of restarting her lower; however, he declines restarting this medication this time - We will revisit at next visit  Type II diabetes mellitus, controlled but with peripheral neuropathy Blood sugars continued to be in excellent control - Continue Lantus 6 units daily - Will follow-up and repeat A1c February

## 2015-11-20 NOTE — Telephone Encounter (Signed)
Verbal ok given to tracy with [facility]. Jazmin Hartsell,CMA

## 2015-11-20 NOTE — Telephone Encounter (Signed)
Verbal approval given for continued [facility] for another 60 days.

## 2015-11-20 NOTE — Assessment & Plan Note (Signed)
Significant improvement in bilateral shoulder and hand pain after restarting colchicine - Discussed uric acid level 7.9 and possibility of restarting her lower; however, he declines restarting this medication this time - We will revisit at next visit

## 2015-11-20 NOTE — Patient Instructions (Signed)
It was great seeing you today.   1. Continue taking colchicine 0.3 mg daily.  Call me if your joint pains get worse.  2. Continue taken Lantus 6 units daily.  Call me if your blood sugars are consistently above 150 3. Your Coumadin levels become more regular, If you eat a consistent amount of greens on a day-to-day basis.    Please bring all your medications to every doctors visit  Sign up for My Chart to have easy access to your labs results, and communication with your Primary care physician.  Next Appointment  Please call to make an appointment with Dr Berkley Harvey in February or sooner if your blood sugars or joint pains get worse.      I look forward to talking with you again at our next visit. If you have any questions or concerns before then, please call the clinic at [phone].  Take Care,   Dr Phill Myron

## 2015-11-20 NOTE — Assessment & Plan Note (Signed)
Continue Coumadin and INR checks by home health nurse - Discussed eating greens more consistently

## 2015-11-22 ENCOUNTER — Other Ambulatory Visit: Payer: Self-pay | Admitting: Family Medicine

## 2015-11-22 DIAGNOSIS — N183 Chronic kidney disease, stage 3 (moderate): Secondary | ICD-10-CM | POA: Diagnosis not present

## 2015-11-22 LAB — POCT INR: INR: 2.2

## 2015-11-27 ENCOUNTER — Encounter (HOSPITAL_COMMUNITY)
Admission: RE | Admit: 2015-11-27 | Discharge: 2015-11-27 | Disposition: A | Discharge status: Home / Self Care | Payer: Medicare Other | Source: Ambulatory Visit | Attending: Nephrology | Admitting: Nephrology

## 2015-11-27 DIAGNOSIS — D509 Iron deficiency anemia, unspecified: Secondary | ICD-10-CM | POA: Insufficient documentation

## 2015-11-27 MED ORDER — SODIUM CHLORIDE 0.9 % IV SOLN
510.0000 mg | INTRAVENOUS | Status: DC
  Administered 2015-11-27: 510 mg via INTRAVENOUS
  Filled 2015-11-27: qty 17

## 2015-12-04 ENCOUNTER — Encounter (HOSPITAL_COMMUNITY)
Admission: RE | Admit: 2015-12-04 | Discharge: 2015-12-04 | Disposition: A | Discharge status: Home / Self Care | Payer: Medicare Other | Source: Ambulatory Visit | Attending: Nephrology | Admitting: Nephrology

## 2015-12-04 DIAGNOSIS — D509 Iron deficiency anemia, unspecified: Secondary | ICD-10-CM | POA: Insufficient documentation

## 2015-12-04 MED ORDER — SODIUM CHLORIDE 0.9 % IV SOLN
510.0000 mg | Freq: Once | INTRAVENOUS | Status: AC
  Administered 2015-12-04: 510 mg via INTRAVENOUS
  Filled 2015-12-04: qty 17

## 2015-12-06 ENCOUNTER — Other Ambulatory Visit: Payer: Self-pay | Admitting: Family Medicine

## 2015-12-06 DIAGNOSIS — Z5181 Encounter for therapeutic drug level monitoring: Secondary | ICD-10-CM | POA: Diagnosis not present

## 2015-12-06 DIAGNOSIS — E119 Type 2 diabetes mellitus without complications: Secondary | ICD-10-CM | POA: Diagnosis not present

## 2015-12-06 DIAGNOSIS — Z7901 Long term (current) use of anticoagulants: Secondary | ICD-10-CM | POA: Diagnosis not present

## 2015-12-06 DIAGNOSIS — I129 Hypertensive chronic kidney disease with stage 1 through stage 4 chronic kidney disease, or unspecified chronic kidney disease: Secondary | ICD-10-CM | POA: Diagnosis not present

## 2015-12-06 DIAGNOSIS — M329 Systemic lupus erythematosus, unspecified: Secondary | ICD-10-CM | POA: Diagnosis not present

## 2015-12-06 DIAGNOSIS — I808 Phlebitis and thrombophlebitis of other sites: Secondary | ICD-10-CM | POA: Diagnosis not present

## 2015-12-06 DIAGNOSIS — N183 Chronic kidney disease, stage 3 (moderate): Secondary | ICD-10-CM | POA: Diagnosis not present

## 2015-12-06 LAB — POCT INR: INR: 2.1

## 2015-12-20 ENCOUNTER — Other Ambulatory Visit: Payer: Self-pay | Admitting: *Deleted

## 2015-12-20 DIAGNOSIS — N183 Chronic kidney disease, stage 3 (moderate): Secondary | ICD-10-CM | POA: Diagnosis not present

## 2015-12-20 LAB — POCT INR: INR: 1.3

## 2015-12-27 ENCOUNTER — Other Ambulatory Visit: Payer: Self-pay | Admitting: *Deleted

## 2015-12-27 LAB — POCT INR: INR: 1.7

## 2016-01-03 ENCOUNTER — Other Ambulatory Visit: Payer: Self-pay | Admitting: *Deleted

## 2016-01-03 DIAGNOSIS — Z7901 Long term (current) use of anticoagulants: Secondary | ICD-10-CM | POA: Diagnosis not present

## 2016-01-03 DIAGNOSIS — Z5181 Encounter for therapeutic drug level monitoring: Secondary | ICD-10-CM | POA: Diagnosis not present

## 2016-01-03 DIAGNOSIS — N183 Chronic kidney disease, stage 3 (moderate): Secondary | ICD-10-CM | POA: Diagnosis not present

## 2016-01-03 DIAGNOSIS — E119 Type 2 diabetes mellitus without complications: Secondary | ICD-10-CM | POA: Diagnosis not present

## 2016-01-03 DIAGNOSIS — I808 Phlebitis and thrombophlebitis of other sites: Secondary | ICD-10-CM | POA: Diagnosis not present

## 2016-01-03 DIAGNOSIS — I129 Hypertensive chronic kidney disease with stage 1 through stage 4 chronic kidney disease, or unspecified chronic kidney disease: Secondary | ICD-10-CM | POA: Diagnosis not present

## 2016-01-03 LAB — POCT INR: INR: 1.3

## 2016-01-10 ENCOUNTER — Encounter: Payer: Self-pay | Admitting: Family Medicine

## 2016-01-10 ENCOUNTER — Other Ambulatory Visit: Payer: Self-pay | Admitting: *Deleted

## 2016-01-10 LAB — POCT INR: INR: 2.5

## 2016-01-10 LAB — CBC AND DIFFERENTIAL
Ferritin: 521
HGB: 12.5 g/dL
IRON SATURATION: 47
Iron: 81
MCV: 82.7
Platelets: 131
TIBC: 172
WBC: 6.4

## 2016-01-17 ENCOUNTER — Other Ambulatory Visit: Payer: Self-pay | Admitting: *Deleted

## 2016-01-17 ENCOUNTER — Telehealth: Payer: Self-pay | Admitting: Family Medicine

## 2016-01-17 DIAGNOSIS — N189 Chronic kidney disease, unspecified: Secondary | ICD-10-CM | POA: Diagnosis not present

## 2016-01-17 DIAGNOSIS — M109 Gout, unspecified: Secondary | ICD-10-CM

## 2016-01-17 LAB — POCT INR: INR: 2.4

## 2016-01-17 NOTE — Telephone Encounter (Signed)
Will forward to MD to give ok for orders. Jazmin Hartsell,CMA  

## 2016-01-17 NOTE — Telephone Encounter (Signed)
Tracey the home health nurse called and would like verbal orders to re-certify the patient for his pill box and Inr. Please call Linus Orn at [phone]. Blima Rich

## 2016-01-18 ENCOUNTER — Other Ambulatory Visit: Payer: Self-pay | Admitting: Family Medicine

## 2016-01-18 DIAGNOSIS — M109 Gout, unspecified: Secondary | ICD-10-CM

## 2016-01-18 MED ORDER — COLCHICINE 0.6 MG PO CAPS: 0.3000 mg | ORAL_CAPSULE | Freq: Every day | ORAL | Status: DC

## 2016-01-18 NOTE — Telephone Encounter (Signed)
Verbal order approved for pill box and inr

## 2016-01-18 NOTE — Telephone Encounter (Signed)
Verbal ok given to tracy with [facility].  She also mentioned that patient had been trying to get a refill on his colchicine for a "while now" with no luck.  I informed her that we didn't have any recent requests for this medication and that I would ask provider about this. Jazmin Hartsell,CMA

## 2016-01-31 ENCOUNTER — Other Ambulatory Visit: Payer: Self-pay | Admitting: *Deleted

## 2016-01-31 DIAGNOSIS — I808 Phlebitis and thrombophlebitis of other sites: Secondary | ICD-10-CM | POA: Diagnosis not present

## 2016-01-31 DIAGNOSIS — N183 Chronic kidney disease, stage 3 (moderate): Secondary | ICD-10-CM | POA: Diagnosis not present

## 2016-01-31 DIAGNOSIS — Z5181 Encounter for therapeutic drug level monitoring: Secondary | ICD-10-CM | POA: Diagnosis not present

## 2016-01-31 DIAGNOSIS — M329 Systemic lupus erythematosus, unspecified: Secondary | ICD-10-CM | POA: Diagnosis not present

## 2016-01-31 DIAGNOSIS — E119 Type 2 diabetes mellitus without complications: Secondary | ICD-10-CM | POA: Diagnosis not present

## 2016-01-31 DIAGNOSIS — Z7901 Long term (current) use of anticoagulants: Secondary | ICD-10-CM | POA: Diagnosis not present

## 2016-01-31 DIAGNOSIS — I129 Hypertensive chronic kidney disease with stage 1 through stage 4 chronic kidney disease, or unspecified chronic kidney disease: Secondary | ICD-10-CM | POA: Diagnosis not present

## 2016-01-31 LAB — POCT INR: INR: 3.8

## 2016-02-06 ENCOUNTER — Ambulatory Visit: Payer: Medicare Other | Admitting: Podiatry

## 2016-02-07 ENCOUNTER — Ambulatory Visit (INDEPENDENT_AMBULATORY_CARE_PROVIDER_SITE_OTHER): Payer: Medicare Other | Admitting: Family Medicine

## 2016-02-07 ENCOUNTER — Ambulatory Visit (INDEPENDENT_AMBULATORY_CARE_PROVIDER_SITE_OTHER): Payer: Medicare Other | Admitting: Podiatry

## 2016-02-07 ENCOUNTER — Other Ambulatory Visit: Payer: Self-pay | Admitting: Family Medicine

## 2016-02-07 ENCOUNTER — Encounter: Payer: Self-pay | Admitting: Podiatry

## 2016-02-07 ENCOUNTER — Telehealth: Payer: Self-pay | Admitting: Family Medicine

## 2016-02-07 ENCOUNTER — Ambulatory Visit
Admission: RE | Admit: 2016-02-07 | Discharge: 2016-02-07 | Disposition: A | Discharge status: Home / Self Care | Payer: Medicare Other | Source: Ambulatory Visit | Attending: Family Medicine | Admitting: Family Medicine

## 2016-02-07 VITALS — BP 170/75 | HR 79 | Temp 98.0°F | Ht 65.0 in | Wt 134.0 lb

## 2016-02-07 DIAGNOSIS — M25512 Pain in left shoulder: Secondary | ICD-10-CM | POA: Diagnosis not present

## 2016-02-07 DIAGNOSIS — M1A0710 Idiopathic chronic gout, right ankle and foot, without tophus (tophi): Secondary | ICD-10-CM

## 2016-02-07 DIAGNOSIS — M25571 Pain in right ankle and joints of right foot: Secondary | ICD-10-CM | POA: Insufficient documentation

## 2016-02-07 DIAGNOSIS — Q828 Other specified congenital malformations of skin: Secondary | ICD-10-CM

## 2016-02-07 DIAGNOSIS — M79676 Pain in unspecified toe(s): Secondary | ICD-10-CM

## 2016-02-07 DIAGNOSIS — M7989 Other specified soft tissue disorders: Secondary | ICD-10-CM | POA: Diagnosis not present

## 2016-02-07 DIAGNOSIS — B351 Tinea unguium: Secondary | ICD-10-CM

## 2016-02-07 DIAGNOSIS — M109 Gout, unspecified: Secondary | ICD-10-CM

## 2016-02-07 DIAGNOSIS — E1143 Type 2 diabetes mellitus with diabetic autonomic (poly)neuropathy: Secondary | ICD-10-CM

## 2016-02-07 LAB — POCT GLYCOSYLATED HEMOGLOBIN (HGB A1C): HEMOGLOBIN A1C: 9.8

## 2016-02-07 MED ORDER — ALLOPURINOL 100 MG PO TABS: 100.0000 mg | ORAL_TABLET | Freq: Every day | ORAL | Status: DC

## 2016-02-07 MED ORDER — FEBUXOSTAT 40 MG PO TABS: 40.0000 mg | ORAL_TABLET | Freq: Every day | ORAL | Status: DC

## 2016-02-07 MED ORDER — ACETAMINOPHEN ER 650 MG PO TBCR: 650.0000 mg | EXTENDED_RELEASE_TABLET | Freq: Four times a day (QID) | ORAL | Status: DC | PRN

## 2016-02-07 NOTE — Assessment & Plan Note (Signed)
Elevated uric acid with continue multiple muscle skeletal aches: Left shoulder, bilateral hands - Uloric 40mg  qd - recheck LFTs, Uric acid in 2-4 weeks at follow-up - continue colchicine

## 2016-02-07 NOTE — Assessment & Plan Note (Signed)
Previous imaging by orthopedist showed severe arthritis.  Also has history of gout with elevated uric acid, which may be contributing as his pain improved after starting colchicine - Restart Uloric 40mg  qd

## 2016-02-07 NOTE — Telephone Encounter (Signed)
Daughter called and wanted to know if the doctor can call in something else over Uloric. The insurance will not cover this. Also since his A1C was high does he need to come back before 03/04/16? If so please let him know. jw

## 2016-02-07 NOTE — Telephone Encounter (Signed)
Switched from [place] to Allopurinol due to insurance formulary.

## 2016-02-07 NOTE — Progress Notes (Signed)
  Patient name: Philip Matthews MRN [id number]  Date of birth: February 21, 1928  CC & HPI:  Philip Matthews is a 82 y.o. male presenting today for left shoulder pain and right ankle pain.   Right ankle pain - Reports closing door hit him on the outside of the right ankle 4 days ago.  He has been able to walk on the ankle with ankle pain persists and has not improved.  He had some swelling over the first few days afterwards, but the swelling has now resolved.  He has been taking Tylenol 650-3 times a day for the pain, as well as elevating and placing ice.   Left shoulder pain - He continues to report chronic left shoulder pain that gets worse with weather - Previously seen by orthopedics and had x-rays performed that showed severe arthritis, however, the recommended against surgery given his age and comorbidities - Also has a history of gout previously on the lower that has been stopped - Had several shoulder injections that he cannot recall having any pain improved for several months after he gets injection  ROS: See HPI   Medical & Surgical Hx:  Reviewed  Medications & Allergies: Reviewed  Social History: Reviewed:   Objective Findings:  Vitals: BP 170/75 mmHg  Pulse 79  Temp(Src) 98 F (36.7 C) (Oral)  Ht 5\' 5"  (1.651 m)  Wt 134 lb (60.782 kg)  BMI 22.30 kg/m2  Gen: NAD Left Shoulder: Severe crepitus.  Passive Abduction limited to 70.  Right ankle: Tenderness over posterior aspect of lateral malleolus.  Mild erythema superior to lateral malleolus, without warmth or tenderness.  Able to walk unassisted with antalgic gait  Assessment & Plan:   Right ankle pain Doubt fracture given mechanism of injury.  However, since pain is not improved over the past 4 days.  Will obtain imaging - DG right ankle; will call with results - Tylenol 650 every 6 hours when necessary - Recommended elevation and ice - Follow-up in 2-3 weeks for reevaluation  Left shoulder pain Previous imaging by  orthopedist showed severe arthritis.  Also has history of gout with elevated uric acid, which may be contributing as his pain improved after starting colchicine - Restart Uloric 40mg  qd  Gout Elevated uric acid with continue multiple muscle skeletal aches: Left shoulder, bilateral hands - Uloric 40mg  qd - recheck LFTs, Uric acid in 2-4 weeks at follow-up - continue colchicine

## 2016-02-07 NOTE — Assessment & Plan Note (Signed)
Doubt fracture given mechanism of injury.  However, since pain is not improved over the past 4 days.  Will obtain imaging - DG right ankle; will call with results - Tylenol 650 every 6 hours when necessary - Recommended elevation and ice - Follow-up in 2-3 weeks for reevaluation

## 2016-02-07 NOTE — Progress Notes (Signed)
Subjective:     Patient ID: Philip Matthews, male   DOB: 12-27-1928, 82 y.o.   MRN: [id number]  HPI patient presents with interpreter and wife with painful lesions underneath the fifth metatarsals of both feet and painful nail disease bilateral with history of diabetic shoes and prevent ulceration and states he needs a new pair   Review of Systems     Objective:   Physical Exam  diminished circulatory status both DP PT pulses with diminished hair growth noted. Neurologically diminished sharp [employer] vibratory noted and patient's found to have nail disease 1-5 both feet that are thick with subungual debris and is found to have lesions underneath the fifth metatarsals of both feet that are moderately painful when pressed and pre-ulcerative in their appearance    Assessment:      mycotic nail infection with pain and lesion formation bilateral fifth metatarsal    Plan:      H&P condition reviewed and long-term diabetes reviewed with patient and family. Patient will have diabetic shoes casted today and we will get approval for these as they are important for him due to his physical condition and today debrided nailbeds 1-5 both feet with no iatrogenic  Bleeding and debridement lesions fifth metatarsal bilateral with no iatrogenic bleeding noted

## 2016-02-07 NOTE — Patient Instructions (Signed)
It was great seeing you today.   1. I have ordered x-rays to evaluate your right ankle pain.  I will call you with these results.  2. For your right shoulder pain.  Take your Uloric 40 mg once a day. 3. You can take Tylenol 650 mg 1 pill every 6 hours as needed for your ankle and shoulder pain.    Please bring all your medications to every doctors visit  Sign up for My Chart to have easy access to your labs results, and communication with your Primary care physician.  Next Appointment  Please make an appointment with Dr Berkley Harvey in 2-4 weeks for your shoulder pain and diabetes   I look forward to talking with you again at our next visit. If you have any questions or concerns before then, please call the clinic at [phone].  Take Care,   Dr Phill Myron

## 2016-02-07 NOTE — Telephone Encounter (Signed)
Spoke with patient informed him about the allopurinol and that provider would be sending this to the pharmacy.  Patient is aware that he can make an appt sooner than 03-04-16 if he would like.  He opted to keep this appt for his diabetes discussion. Jazmin Hartsell,CMA

## 2016-02-14 ENCOUNTER — Other Ambulatory Visit: Payer: Self-pay | Admitting: Family Medicine

## 2016-02-14 ENCOUNTER — Other Ambulatory Visit: Payer: Self-pay | Admitting: *Deleted

## 2016-02-14 DIAGNOSIS — N183 Chronic kidney disease, stage 3 (moderate): Secondary | ICD-10-CM | POA: Diagnosis not present

## 2016-02-14 LAB — POCT INR: INR: 2.8

## 2016-02-14 MED ORDER — METOPROLOL SUCCINATE ER 25 MG PO TB24: 25.0000 mg | ORAL_TABLET | Freq: Every day | ORAL | Status: DC

## 2016-02-15 MED ORDER — INSULIN PEN NEEDLE 32G X 6 MM MISC: Status: DC

## 2016-02-28 ENCOUNTER — Other Ambulatory Visit: Payer: Self-pay | Admitting: *Deleted

## 2016-02-28 DIAGNOSIS — I808 Phlebitis and thrombophlebitis of other sites: Secondary | ICD-10-CM | POA: Diagnosis not present

## 2016-02-28 DIAGNOSIS — N183 Chronic kidney disease, stage 3 (moderate): Secondary | ICD-10-CM | POA: Diagnosis not present

## 2016-02-28 DIAGNOSIS — M329 Systemic lupus erythematosus, unspecified: Secondary | ICD-10-CM | POA: Diagnosis not present

## 2016-02-28 DIAGNOSIS — Z7901 Long term (current) use of anticoagulants: Secondary | ICD-10-CM | POA: Diagnosis not present

## 2016-02-28 DIAGNOSIS — Z5181 Encounter for therapeutic drug level monitoring: Secondary | ICD-10-CM | POA: Diagnosis not present

## 2016-02-28 LAB — POCT INR: INR: 3.2

## 2016-02-29 MED ORDER — LEVOTHYROXINE SODIUM 50 MCG PO TABS: ORAL_TABLET | ORAL | Status: DC

## 2016-03-04 ENCOUNTER — Encounter: Payer: Self-pay | Admitting: Family Medicine

## 2016-03-04 ENCOUNTER — Ambulatory Visit (INDEPENDENT_AMBULATORY_CARE_PROVIDER_SITE_OTHER): Payer: Medicare Other | Admitting: Family Medicine

## 2016-03-04 VITALS — BP 167/70 | HR 77 | Temp 98.0°F | Ht 65.0 in | Wt 136.0 lb

## 2016-03-04 DIAGNOSIS — E1143 Type 2 diabetes mellitus with diabetic autonomic (poly)neuropathy: Secondary | ICD-10-CM | POA: Diagnosis not present

## 2016-03-04 DIAGNOSIS — M109 Gout, unspecified: Secondary | ICD-10-CM | POA: Diagnosis not present

## 2016-03-04 DIAGNOSIS — N183 Chronic kidney disease, stage 3 unspecified: Secondary | ICD-10-CM

## 2016-03-04 DIAGNOSIS — I10 Essential (primary) hypertension: Secondary | ICD-10-CM

## 2016-03-04 LAB — CBC
HEMATOCRIT: 35.1 % — AB (ref 39.0–52.0)
HEMOGLOBIN: 10.6 g/dL — AB (ref 13.0–17.0)
MCH: 25.8 pg — ABNORMAL LOW (ref 26.0–34.0)
MCHC: 30.2 g/dL (ref 30.0–36.0)
MCV: 85.4 fL (ref 78.0–100.0)
MPV: 11.4 fL (ref 8.6–12.4)
Platelets: 170 10*3/uL (ref 150–400)
RBC: 4.11 MIL/uL — ABNORMAL LOW (ref 4.22–5.81)
RDW: 16.4 % — AB (ref 11.5–15.5)
WBC: 7 10*3/uL (ref 4.0–10.5)

## 2016-03-04 LAB — COMPLETE METABOLIC PANEL WITH GFR
ALT: 61 U/L — ABNORMAL HIGH (ref 9–46)
AST: 53 U/L — ABNORMAL HIGH (ref 10–35)
Albumin: 3.7 g/dL (ref 3.6–5.1)
Alkaline Phosphatase: 104 U/L (ref 40–115)
BUN: 50 mg/dL — ABNORMAL HIGH (ref 7–25)
CALCIUM: 8.6 mg/dL (ref 8.6–10.3)
CHLORIDE: 104 mmol/L (ref 98–110)
CO2: 27 mmol/L (ref 20–31)
Creat: 1.57 mg/dL — ABNORMAL HIGH (ref 0.70–1.11)
GFR, EST AFRICAN AMERICAN: 46 mL/min — AB (ref >=60)
GFR, EST NON AFRICAN AMERICAN: 40 mL/min — AB (ref >=60)
Glucose, Bld: 234 mg/dL — ABNORMAL HIGH (ref 65–99)
Potassium: 4.1 mmol/L (ref 3.5–5.3)
Sodium: 139 mmol/L (ref 135–146)
Total Bilirubin: 0.2 mg/dL (ref 0.2–1.2)
Total Protein: 6.8 g/dL (ref 6.1–8.1)

## 2016-03-04 LAB — URIC ACID: Uric Acid, Serum: 5.8 mg/dL (ref 4.0–7.8)

## 2016-03-04 NOTE — Progress Notes (Signed)
  Patient name: Philip Matthews MRN [id number]  Date of birth: [birth year]  CC & HPI:  HOOPER GRIESEL is a 82 y.o. male presenting today for diabetes and left shoulder pain.   DIABETES   Blood Sugar Ranges:  Reports a.m.readings in the low-mid100  Symptoms of Hypoglycemia? no  Comorbid Symptoms: no Chest pain; no SOB; no Neuropathy: no Vision problems  Medication Compliance: yes, Lantus 6u daily   Medication Side Effects: no  Counseling  Diet pattern: Drinks sweat tea 1-2 x week     left shoulder pain -  He reports improvement in his shoulder pain with restarting allopurinol -  Denies rash, nausea, abdominal pain -  Continues taking Tylenol 1-2 times a day prn  Smoking History Noted  Objective Findings:  Vitals: BP 167/70 mmHg  Pulse 77  Temp(Src) 98 F (36.7 C) (Oral)  Ht 5\' 5"  (1.651 m)  Wt 136 lb (61.689 kg)  BMI 22.63 kg/m2  Gen: NAD CV: RRR w/o m/r/g, pulses +2 b/l Resp: CTAB w/ normal respiratory effort  Assessment & Plan:   Isolated systolic HTN, goal SBP 123456.  SBP elevated today but diastolic 70.  Will recheck in next visit.  If remains elevated, consider repeat ambulatory blood pressure monitoring. Ideally would love to get his systolic pressure less than 150 without dropping diastolic below 70 - f/u in 1-2 weeks  Gout  Left shoulder pain improved with allopurinol.  -  Check uric acid, CMP, CBC -  Continue allopurinol with goal uric acid less than 6 -  Follow INR closely after resuming allopurinol  Type II diabetes mellitus, controlled but with peripheral neuropathy  A1c last visit 9.8,  But reports a.m. Blood sugars low to mid 100s.  Denies hypoglycemia.  Has been drinking sweat Tea 1-2 times a week and eating some cookies.  -  Recommended checking blood sugar 3-4 times a day with fasting and two-hour postprandial.  -  Consider titrating Lantus versus adding short-acting insulin based on readings -  Follow-up in 1-2 weeks

## 2016-03-04 NOTE — Patient Instructions (Signed)
It was great seeing you today.   I have order some labs today to check on your gout. I will send you a letter with the results, or call you if we need to make any changes to your current therapies.  Check your blood sugar 3-4 times a day.  Fasting prior to eating breakfast or taking medication,  And 2 hours after breakfast, lunch and dinner.   Bring your diabetic meter and blood sugar log to next appointment   Please bring all your medications to every doctors visit  Sign up for My Chart to have easy access to your labs results, and communication with your Primary care physician.  Next Appointment  Please make an appointment with Dr Berkley Harvey in 1-2    I look forward to talking with you again at our next visit. If you have any questions or concerns before then, please call the clinic at [phone].  Take Care,   Dr Phill Myron

## 2016-03-04 NOTE — Assessment & Plan Note (Signed)
SBP elevated today but diastolic 70.  Will recheck in next visit.  If remains elevated, consider repeat ambulatory blood pressure monitoring. Ideally would love to get his systolic pressure less than 150 without dropping diastolic below 70 - f/u in 1-2 weeks

## 2016-03-04 NOTE — Assessment & Plan Note (Signed)
Left shoulder pain improved with allopurinol.  -  Check uric acid, CMP, CBC -  Continue allopurinol with goal uric acid less than 6 -  Follow INR closely after resuming allopurinol

## 2016-03-04 NOTE — Assessment & Plan Note (Signed)
A1c last visit 9.8,  But reports a.m. Blood sugars low to mid 100s.  Denies hypoglycemia.  Has been drinking sweat Tea 1-2 times a week and eating some cookies.  -  Recommended checking blood sugar 3-4 times a day with fasting and two-hour postprandial.  -  Consider titrating Lantus versus adding short-acting insulin based on readings -  Follow-up in 1-2 weeks

## 2016-03-06 ENCOUNTER — Other Ambulatory Visit: Payer: Self-pay | Admitting: *Deleted

## 2016-03-06 LAB — POCT INR: INR: 2.3

## 2016-03-06 MED ORDER — CLONIDINE HCL 0.2 MG/24HR TD PTWK: MEDICATED_PATCH | TRANSDERMAL | Status: DC

## 2016-03-13 ENCOUNTER — Other Ambulatory Visit: Payer: Self-pay | Admitting: *Deleted

## 2016-03-13 DIAGNOSIS — N183 Chronic kidney disease, stage 3 (moderate): Secondary | ICD-10-CM | POA: Diagnosis not present

## 2016-03-13 LAB — CBC AND DIFFERENTIAL
HEMATOCRIT: 34 % — AB (ref 41–53)
Hemoglobin: 10.2 g/dL — AB (ref 13.5–17.5)
PLATELETS: 147 10*3/uL — AB (ref 150–399)
WBC: 6.2 10*3/mL

## 2016-03-13 LAB — POCT INR: INR: 3

## 2016-03-18 ENCOUNTER — Ambulatory Visit (INDEPENDENT_AMBULATORY_CARE_PROVIDER_SITE_OTHER): Payer: Medicare Other | Admitting: Family Medicine

## 2016-03-18 VITALS — BP 150/75 | HR 63 | Temp 97.7°F | Wt 135.0 lb

## 2016-03-18 DIAGNOSIS — N183 Chronic kidney disease, stage 3 unspecified: Secondary | ICD-10-CM

## 2016-03-18 DIAGNOSIS — E1143 Type 2 diabetes mellitus with diabetic autonomic (poly)neuropathy: Secondary | ICD-10-CM | POA: Diagnosis present

## 2016-03-18 DIAGNOSIS — R7989 Other specified abnormal findings of blood chemistry: Secondary | ICD-10-CM | POA: Diagnosis not present

## 2016-03-18 DIAGNOSIS — R945 Abnormal results of liver function studies: Secondary | ICD-10-CM

## 2016-03-18 DIAGNOSIS — R799 Abnormal finding of blood chemistry, unspecified: Secondary | ICD-10-CM | POA: Diagnosis not present

## 2016-03-18 LAB — COMPLETE METABOLIC PANEL WITH GFR
ALT: 30 U/L (ref 9–46)
AST: 35 U/L (ref 10–35)
Albumin: 3.9 g/dL (ref 3.6–5.1)
Alkaline Phosphatase: 85 U/L (ref 40–115)
BUN: 45 mg/dL — AB (ref 7–25)
CHLORIDE: 101 mmol/L (ref 98–110)
CO2: 31 mmol/L (ref 20–31)
CREATININE: 1.62 mg/dL — AB (ref 0.70–1.11)
Calcium: 9.4 mg/dL (ref 8.6–10.3)
GFR, Est African American: 44 mL/min — ABNORMAL LOW (ref >=60)
GFR, Est Non African American: 38 mL/min — ABNORMAL LOW (ref >=60)
GLUCOSE: 195 mg/dL — AB (ref 65–99)
Potassium: 4.9 mmol/L (ref 3.5–5.3)
SODIUM: 138 mmol/L (ref 135–146)
Total Bilirubin: 0.3 mg/dL (ref 0.2–1.2)
Total Protein: 7.1 g/dL (ref 6.1–8.1)

## 2016-03-18 LAB — GLUCOSE, CAPILLARY: Glucose-Capillary: 179 mg/dL — ABNORMAL HIGH (ref 65–99)

## 2016-03-18 MED ORDER — GLUCOSE BLOOD VI STRP: ORAL_STRIP | Status: DC

## 2016-03-18 MED ORDER — ONETOUCH ULTRASOFT LANCETS MISC: Status: DC

## 2016-03-18 MED ORDER — ONETOUCH ULTRA 2 W/DEVICE KIT: 1.0000 | PACK | Freq: Once | Status: DC

## 2016-03-18 NOTE — Progress Notes (Signed)
  Patient name: [name] MRN [id number]  Date of birth: 03-18-1928  CC & HPI:  Philip Matthews is a 82 y.o. male presenting today for diabetes.  - Has been check cbgs 3-4 times a day. All reading under 200 on paper, but reports meter stopped working the past 5 days.  - Meter reads over past 1-2 weeks: 100s-400 - denies hypoglycemia - denies CP, SOB - denies ab pain, n/v/d  Smoking History Noted  Objective Findings:  Vitals: BP 150/75 mmHg  Pulse 63  Temp(Src) 97.7 F (36.5 C) (Oral)  Wt 135 lb (61.236 kg)  Gen: NAD CV: RRR w/o m/r/g, pulses +2 b/l Resp: CTAB w/ normal respiratory effort Foot Exam: Pulses 2+; No Ulcers, bruises or cuts; Monofilament testing: Sensation intact b/l ; mild calluses on bilateral feet  Assessment & Plan:   Type II diabetes mellitus, controlled but with peripheral neuropathy DM meter checked against clinic meter and off by ~40 points - New meter Rx - continue to check cbgs fasting and 2hr postprandial  - f/u with Dr Philip Matthews in 1-2 weeks  Elevated LFTs AST/ALT mildly elevated after starting allopurinol - recheck CMET - consider decreasing allopurinol to 100mg  every other day or 50mg  daily

## 2016-03-18 NOTE — Assessment & Plan Note (Signed)
DM meter checked against clinic meter and off by ~40 points - New meter Rx - continue to check cbgs fasting and 2hr postprandial  - f/u with Dr Valentina Lucks in 1-2 weeks

## 2016-03-18 NOTE — Patient Instructions (Signed)
I have sent in a prescription for new meter.    Continue checking your blood sugar first thing in the morning prior to eating or taking medications  Continue checking her blood sugar 2 hours after meals  Bring your blood sugar wall all medications and meter to your next appointment with Dr Valentina Lucks

## 2016-03-18 NOTE — Assessment & Plan Note (Signed)
AST/ALT mildly elevated after starting allopurinol - recheck CMET - consider decreasing allopurinol to 100mg  every other day or 50mg  daily

## 2016-03-20 ENCOUNTER — Telehealth: Payer: Self-pay | Admitting: Family Medicine

## 2016-03-20 NOTE — Telephone Encounter (Signed)
Linus Orn called from [facility] and would like verbal orders to re certify the patient for 60 days more. jw

## 2016-03-21 NOTE — Telephone Encounter (Signed)
Will forward to MD to give verbal consent. Jazmin Hartsell,CMA  

## 2016-03-21 NOTE — Telephone Encounter (Signed)
Verbal approval to continue [facility].

## 2016-03-22 NOTE — Telephone Encounter (Signed)
Verbal ok given to tracy. Schuyler Behan,CMA

## 2016-03-25 ENCOUNTER — Encounter: Payer: Self-pay | Admitting: Family Medicine

## 2016-03-25 ENCOUNTER — Ambulatory Visit: Payer: Medicare Other | Admitting: Pharmacist

## 2016-03-27 ENCOUNTER — Other Ambulatory Visit: Payer: Self-pay | Admitting: *Deleted

## 2016-03-27 DIAGNOSIS — I129 Hypertensive chronic kidney disease with stage 1 through stage 4 chronic kidney disease, or unspecified chronic kidney disease: Secondary | ICD-10-CM | POA: Diagnosis not present

## 2016-03-27 DIAGNOSIS — N183 Chronic kidney disease, stage 3 (moderate): Secondary | ICD-10-CM | POA: Diagnosis not present

## 2016-03-27 DIAGNOSIS — I808 Phlebitis and thrombophlebitis of other sites: Secondary | ICD-10-CM | POA: Diagnosis not present

## 2016-03-27 DIAGNOSIS — M329 Systemic lupus erythematosus, unspecified: Secondary | ICD-10-CM | POA: Diagnosis not present

## 2016-03-27 DIAGNOSIS — Z7901 Long term (current) use of anticoagulants: Secondary | ICD-10-CM | POA: Diagnosis not present

## 2016-03-27 DIAGNOSIS — Z5181 Encounter for therapeutic drug level monitoring: Secondary | ICD-10-CM | POA: Diagnosis not present

## 2016-03-27 DIAGNOSIS — E119 Type 2 diabetes mellitus without complications: Secondary | ICD-10-CM | POA: Diagnosis not present

## 2016-03-27 LAB — CBC AND DIFFERENTIAL
HEMATOCRIT: 34 % — AB (ref 41–53)
Hemoglobin: 10.6 g/dL — AB (ref 13.5–17.5)
PLATELETS: 137 10*3/uL — AB (ref 150–399)
WBC: 6.9 10^3/mL

## 2016-03-27 LAB — POCT INR: INR: 3.8

## 2016-03-28 ENCOUNTER — Encounter: Payer: Self-pay | Admitting: Pharmacist

## 2016-03-28 ENCOUNTER — Ambulatory Visit (INDEPENDENT_AMBULATORY_CARE_PROVIDER_SITE_OTHER): Payer: Medicare Other | Admitting: Pharmacist

## 2016-03-28 VITALS — BP 152/68 | HR 75 | Wt 138.0 lb

## 2016-03-28 DIAGNOSIS — E1143 Type 2 diabetes mellitus with diabetic autonomic (poly)neuropathy: Secondary | ICD-10-CM

## 2016-03-28 DIAGNOSIS — E782 Mixed hyperlipidemia: Secondary | ICD-10-CM

## 2016-03-28 DIAGNOSIS — I10 Essential (primary) hypertension: Secondary | ICD-10-CM | POA: Diagnosis not present

## 2016-03-28 MED ORDER — INSULIN ASPART 100 UNIT/ML FLEXPEN: 2.0000 [IU] | PEN_INJECTOR | Freq: Three times a day (TID) | SUBCUTANEOUS | Status: DC

## 2016-03-28 NOTE — Assessment & Plan Note (Signed)
ASCVD risk greater than 7.5%. Continued Aspirin 81 mg and Continued pravastatin 40 mg with LDL in 2015 measured at < 40.  Repeat LDL or cholesterol panel at next blood draw.

## 2016-03-28 NOTE — Patient Instructions (Addendum)
Decrease Lantus to 5 units daily in the morning.   Take Novolog 2 units prior to each large meal - three times daily.   Continue to check and record blood sugars as you have been doing.  First thing in AM then 2 hours after each meal.   Follow up with next pharmacist visit in 2-3 weeks.

## 2016-03-28 NOTE — Assessment & Plan Note (Signed)
Diabetes longstanding currently with suboptimal control . Patient denies hypoglycemic events and is able to verbalize appropriate hypoglycemia management plan. Patient reports adherence with medication. Likely needs prandial insulin with all meals. Decreased dose of basal insulin Lantus (insulin glargine) to 5units each morning. Started rapid insulin Novolog (insulin aspart) at a dose of 2 units prior to each meal.  Sample pen provided.  Following instruction, patient was able to demonstrate correct dosing with first dose in office. Next A1C anticipated 2-3 months.

## 2016-03-28 NOTE — Assessment & Plan Note (Signed)
Hypertension longstanding/newly diagnosed currently near goal given age, diabetes and renal function.  Patient reports adherence with medication.

## 2016-03-28 NOTE — Progress Notes (Signed)
S:    Patient arrives walking unassisted, accompanied by his daughter [place] AND interpreter [place].  Presents for diabetes evaluation, education, and management at the request of Dr. Berkley Harvey. Patient was referred on 03/20/2016.  Patient was last seen by Primary Care Provider on 03/20/2016.   Patient reports Diabetes was diagnosed in 2011.   Patient reports adherence with medications.  Current diabetes medications include: Lantus 6 units QAM Current hypertension medications include: amlodipine 10mg , furosemide 40mg , Hydralazine, metoprolol  Patient denies hypoglycemic events.  Patient reported dietary habits: Eats 3 meals/day ([facility])  Patient reported exercise habits:    O:  Lab Results  Component Value Date   HGBA1C 9.8 02/07/2016   Filed Vitals:   03/28/16 0910  BP: 152/68  Pulse: 75    Home fasting CBG: 100-150 2 hour post-prandial/random CBG: 150-250 with many readings> 180.  A/P: Diabetes longstanding currently with suboptimal control . Patient denies hypoglycemic events and is able to verbalize appropriate hypoglycemia management plan. Patient reports adherence with medication. Likely needs prandial insulin with all meals. Decreased dose of basal insulin Lantus (insulin glargine) to 5units each morning. Started rapid insulin Novolog (insulin aspart) at a dose of 2 units prior to each meal.  Sample pen provided.  Following instruction, patient was able to perform first dose in office. Next A1C anticipated 2-3 months.  ASCVD risk greater than 7.5%. Continued Aspirin 81 mg and Continued pravastatin 40 mg with LDL in 2015 measured at < 40.  Repeat LDL or cholesterol panel at next blood draw.   Hypertension longstanding/newly diagnosed currently near goal given age, diabetes and renal function.  Patient reports adherence with medication.   Written patient instructions provided.  Total time in face to face counseling 50 minutes.   Follow up in Pharmacist  Clinic Visit on April 17th .   Patient seen with Rudolpho Sevin, PharmD Candidate, and Milana Na,  PharmD Candidate.

## 2016-03-28 NOTE — Progress Notes (Signed)
Patient ID: Philip Matthews, male   DOB: 05/31/1928, 82 y.o.   MRN: [id number] Reviewed: Agree with Dr. Graylin Shiver documentation and management.

## 2016-03-29 ENCOUNTER — Encounter: Payer: Self-pay | Admitting: Family Medicine

## 2016-03-29 LAB — IRON AND TIBC
Ferritin: 176
IRON SATURATION: 9 — AB (ref 15–60)
Iron: 17
TIBC: 183
UIBC: 166

## 2016-04-03 ENCOUNTER — Other Ambulatory Visit: Payer: Self-pay | Admitting: Family Medicine

## 2016-04-03 MED ORDER — "INSULIN SYRINGE-NEEDLE U-100 25G X 1"" 1 ML MISC": Status: DC

## 2016-04-04 ENCOUNTER — Other Ambulatory Visit (HOSPITAL_COMMUNITY): Payer: Self-pay | Admitting: *Deleted

## 2016-04-05 ENCOUNTER — Ambulatory Visit (HOSPITAL_COMMUNITY)
Admission: RE | Admit: 2016-04-05 | Discharge: 2016-04-05 | Disposition: A | Discharge status: Home / Self Care | Payer: Medicare Other | Source: Ambulatory Visit | Attending: Nephrology | Admitting: Nephrology

## 2016-04-05 DIAGNOSIS — D509 Iron deficiency anemia, unspecified: Secondary | ICD-10-CM | POA: Diagnosis not present

## 2016-04-05 MED ORDER — SODIUM CHLORIDE 0.9 % IV SOLN
510.0000 mg | INTRAVENOUS | Status: DC
Start: 2016-04-05 — End: 2016-04-11
  Administered 2016-04-05: 510 mg via INTRAVENOUS
  Filled 2016-04-05: qty 17

## 2016-04-10 ENCOUNTER — Other Ambulatory Visit: Payer: Self-pay | Admitting: *Deleted

## 2016-04-10 DIAGNOSIS — N183 Chronic kidney disease, stage 3 (moderate): Secondary | ICD-10-CM | POA: Diagnosis not present

## 2016-04-10 LAB — CBC AND DIFFERENTIAL
HCT: 33 % — AB (ref 41–53)
HEMOGLOBIN: 10 g/dL — AB (ref 13.5–17.5)
PLATELETS: 139 10*3/uL — AB (ref 150–399)
WBC: 6.2 10*3/mL

## 2016-04-11 ENCOUNTER — Ambulatory Visit (HOSPITAL_COMMUNITY)
Admission: RE | Admit: 2016-04-11 | Discharge: 2016-04-11 | Disposition: A | Discharge status: Home / Self Care | Payer: Medicare Other | Source: Ambulatory Visit | Attending: Nephrology | Admitting: Nephrology

## 2016-04-11 DIAGNOSIS — D509 Iron deficiency anemia, unspecified: Secondary | ICD-10-CM | POA: Insufficient documentation

## 2016-04-11 LAB — POCT INR: INR: 3.2

## 2016-04-11 MED ORDER — SODIUM CHLORIDE 0.9 % IV SOLN
510.0000 mg | INTRAVENOUS | Status: AC
  Administered 2016-04-11: 510 mg via INTRAVENOUS
  Filled 2016-04-11: qty 17

## 2016-04-15 ENCOUNTER — Ambulatory Visit (INDEPENDENT_AMBULATORY_CARE_PROVIDER_SITE_OTHER): Payer: Medicare Other | Admitting: Pharmacist

## 2016-04-15 ENCOUNTER — Encounter: Payer: Self-pay | Admitting: Pharmacist

## 2016-04-15 VITALS — BP 169/67 | HR 61 | Ht 64.0 in | Wt 133.4 lb

## 2016-04-15 DIAGNOSIS — I10 Essential (primary) hypertension: Secondary | ICD-10-CM | POA: Diagnosis not present

## 2016-04-15 DIAGNOSIS — E1143 Type 2 diabetes mellitus with diabetic autonomic (poly)neuropathy: Secondary | ICD-10-CM

## 2016-04-15 MED ORDER — HYDRALAZINE HCL 50 MG PO TABS: 50.0000 mg | ORAL_TABLET | Freq: Three times a day (TID) | ORAL | Status: DC

## 2016-04-15 NOTE — Patient Instructions (Signed)
Increase your hydralazine dose to 50 mg (2 tablets) three times per day until your tablets are gone.   Once your 25 mg tablets are gone, pick up your new prescription for hydralazine 50 mg. Take 1 tablet (50 mg) three times daily.  Continue to check your blood sugar at least twice per day and record your results. Always check your blood sugar in the morning, and then alternate between either before lunch, before supper, or at bedtime.  Follow up with the pharmacist Dr. Valentina Lucks in 3 weeks.

## 2016-04-15 NOTE — Progress Notes (Signed)
Patient ID: Philip Matthews, male   DOB: 04-03-1928, 82 y.o.   MRN: [id number] Reviewed: Agree with Dr. Graylin Shiver documentation and management.

## 2016-04-15 NOTE — Progress Notes (Signed)
S:    Patient arrives ambulating without assistance with his daughter and their sign Ecologist.  Presents for diabetes evaluation, education, and management at the request of Dr. Berkley Harvey. Patient was referred on 03/18/16.  Patient was last seen by Primary Care Provider on 03/18/16.   Patient reports Diabetes was diagnosed in 2011.   Patient reports adherence with medications.  Current diabetes medications include: insulin aspart 2 units TID with meals, insulin glargine 5 units qAM Current hypertension medications include: hydralazine 25 mg TID, amlodipine 5 mg qday, clonidine 0.2 mg patch weekly, furosemide 20 mg qday  Patient denies hypoglycemic events.  No edema of his legs bilaterally.   O:  Lab Results  Component Value Date   HGBA1C 9.8 02/07/2016   Filed Vitals:   04/15/16 0840 04/15/16 0845  BP: 179/81 169/67  Pulse:  61   Home fasting CBG: 110s-150s 2 hour post-prandial/random CBG: 89-179.  10 year ASCVD risk: >7.5%.  A/P: Diabetes: diagnosed 6 years ago, unclear what current level of control is. Last A1c in February was elevated at 9.8, but we do not have enough accurate blood sugar readings to get a good idea of his current control due to issues with his meter. This has been resolved and his daughter will help him get more accurate readings over the next couple weeks. The readings we currently have do suggest that his blood sugar is under much better control than previously when his post-prandial CBGs were over 300. Patient denies hypoglycemic events and is able to verbalize appropriate hypoglycemia management plan. Patient reports adherence with medication. Control is suboptimal due to newly requiring insulin and still adjusting regimen.  Pt was instructed to continue all current insulin doses and to continue to check his blood sugar at least twice daily, with one reading always being a fasting reading in the morning. Next A1C anticipated May 2017.    Hypertension:  longstanding, currently not at goal.  Patient reports adherence with medication. Control is suboptimal due to loss of autodiuresis from getting blood sugar under better control. Hydralazine dose was increased to 50 mg TID.  Instructed to take two 25mg  tablets until gone then start the 50mg  TID regimen (new prescription).   Goal Systolic BP is [id number].  Next adjustment suggested will be to maximize the amlodipine to 10mg  daily.   ASCVD risk greater than 7.5%. Continued Aspirin 81 mg and Continued pravastatin 40 mg.    Recommended Medic Alert Bracelet for this patient.  Written patient instructions provided.  Total time in face to face counseling 30 minutes.   Follow up in Pharmacist Clinic Visit in 3 weeks.   Patient seen with Rudolpho Sevin, PharmD Candidate and Governor Specking, PharmD Resident.

## 2016-04-15 NOTE — Assessment & Plan Note (Addendum)
Diabetes: diagnosed 6 years ago, unclear what current level of control is. Last A1c in February was elevated at 9.8, but we do not have enough accurate blood sugar readings to get a good idea of his current control due to issues with his meter. This has been resolved and his daughter will help him get more accurate readings over the next couple weeks. The readings we currently have do suggest that his blood sugar is under much better control than previously when his post-prandial CBGs were over 300. Patient denies hypoglycemic events and is able to verbalize appropriate hypoglycemia management plan. Patient reports adherence with medication. Control is suboptimal due to newly requiring insulin and still adjusting regimen.

## 2016-04-17 ENCOUNTER — Encounter: Payer: Self-pay | Admitting: Family Medicine

## 2016-04-17 ENCOUNTER — Telehealth: Payer: Self-pay | Admitting: *Deleted

## 2016-04-17 NOTE — Telephone Encounter (Signed)
Home health nurse called to call in his INR, which was 2.8. He is currently taking 7.5mg  on mondays and 5mg  on other days. He hasn't missed any doses, any bleeding, or hospital/ED visits. Per Dr Nori Riis hold x1 day and resume his regular schedule and have them recheck in 1 week. Philip Matthews, CMA

## 2016-04-17 NOTE — Progress Notes (Signed)
Patient ID: Philip Matthews, male   DOB: Oct 16, 1928, 82 y.o.   MRN: [id number] Called by home health w INR 2.8. He was 3.2 at last check and 3.8 on 03/27/16, 3.0 on 03/13/2016. He is 87 and on coumadin for portal vein thrmbosis. I will have him hold one dose with recheck in one week (they visit him every Wed). He is having no bleeding issues per [facility].

## 2016-04-18 ENCOUNTER — Telehealth: Payer: Self-pay | Admitting: Family Medicine

## 2016-04-18 ENCOUNTER — Other Ambulatory Visit: Payer: Self-pay | Admitting: *Deleted

## 2016-04-18 LAB — POCT INR: INR: 2.8

## 2016-04-24 ENCOUNTER — Other Ambulatory Visit: Payer: Self-pay | Admitting: *Deleted

## 2016-04-24 DIAGNOSIS — Z7901 Long term (current) use of anticoagulants: Secondary | ICD-10-CM | POA: Diagnosis not present

## 2016-04-24 DIAGNOSIS — E119 Type 2 diabetes mellitus without complications: Secondary | ICD-10-CM | POA: Diagnosis not present

## 2016-04-24 DIAGNOSIS — I808 Phlebitis and thrombophlebitis of other sites: Secondary | ICD-10-CM | POA: Diagnosis not present

## 2016-04-24 DIAGNOSIS — M329 Systemic lupus erythematosus, unspecified: Secondary | ICD-10-CM | POA: Diagnosis not present

## 2016-04-24 DIAGNOSIS — Z5181 Encounter for therapeutic drug level monitoring: Secondary | ICD-10-CM | POA: Diagnosis not present

## 2016-04-24 DIAGNOSIS — N183 Chronic kidney disease, stage 3 (moderate): Secondary | ICD-10-CM | POA: Diagnosis not present

## 2016-04-24 LAB — POCT INR: INR: 2

## 2016-04-25 ENCOUNTER — Telehealth: Payer: Self-pay | Admitting: *Deleted

## 2016-04-25 NOTE — Telephone Encounter (Signed)
Tracey from [facility] called to confirm what robert told her yesterday. She was informed. Deseree Kennon Holter, CMA

## 2016-05-03 ENCOUNTER — Ambulatory Visit (INDEPENDENT_AMBULATORY_CARE_PROVIDER_SITE_OTHER): Payer: Medicare Other | Admitting: Pharmacist

## 2016-05-03 ENCOUNTER — Encounter: Payer: Self-pay | Admitting: Pharmacist

## 2016-05-03 VITALS — BP 188/66 | HR 68 | Wt 132.6 lb

## 2016-05-03 DIAGNOSIS — I10 Essential (primary) hypertension: Secondary | ICD-10-CM

## 2016-05-03 DIAGNOSIS — E1143 Type 2 diabetes mellitus with diabetic autonomic (poly)neuropathy: Secondary | ICD-10-CM | POA: Diagnosis not present

## 2016-05-03 NOTE — Assessment & Plan Note (Signed)
Diabetes longstanding since 2011 currently improved control with use of basal and bolus insulin. Patient denies hypoglycemic events (however several readings were < 70).  Reviewed appropriate hypoglycemia management plan. Patient reports adherence with medication/ uses medication box filled by his nurse. Continued basal insulin Lantus (insulin glargine) 4 units qAM. Continued rapid insulin Novolog (insulin aspart) at 2 units prior to each meal.   Next A1C anticipated 1-2 months.   Advised to check blood sugar in the morning before he eats and then a second time at any time during the day. Check at least twice a day and PRN symptoms of hypoglycemia.

## 2016-05-03 NOTE — Progress Notes (Signed)
S:    Patient arrives in good spirits with interpreter Romelle Starcher) and daughter both present.  Presents for diabetes evaluation, education, and management at the request of Dr. Berkley Harvey. Patient was referred on 03/18/16.  Patient was last seen by Primary Care Provider on 03/18/16.   Patient reports Diabetes was diagnosed in 2011.   Patient reports adherence with medications.  Current diabetes medications include: insulin aspart 2 units TID with meals, insulin glargine 4 units in the morning Current hypertension medications include: hydralazine 50 mg TID, amlodipine 5 mg daily, clonidine 0.2 mg patch weekly, furosemide 20 mg daily  Patient denies hypoglycemic events. Although there was at least 2 readings < 70 with one being 56. Patient reported that he did not experience any symptoms when this happened. This reading of 56 was before bedtime.  No edema in the legs bilaterally.   Patient denies nocturia.  Patient denies neuropathy. Patient denies visual changes. Patient denies self foot exams.   O:  Lab Results  Component Value Date   HGBA1C 9.8 02/07/2016   There were no vitals filed for this visit.  Home CBG: 185, 165, 165 Blood pressure at visit: 188/66  A/P: Diabetes longstanding since 2011 currently improved control with use of basal and bolus insulin. Patient denies hypoglycemic events (however several readings were < 70).  Reviewed appropriate hypoglycemia management plan. Patient reports adherence with medication/ uses medication box filled by his nurse. Continued basal insulin Lantus (insulin glargine) 4 units qAM. Continued rapid insulin Novolog (insulin aspart) at 2 units prior to each meal.   Next A1C anticipated 1-2 months.   Advised to check blood sugar in the morning before he eats and then a second time at any time during the day. Check at least twice a day and PRN symptoms of hypoglycemia.    ASCVD risk greater than 7.5%. Continued Aspirin 81 mg and Continued pravastatin 40  mg.   Hypertension longstanding currently elevated at this visit most likely related to patch being 84 days old.   Replaced Clonidine patch 0.2mg  today in office.  Patient reports adherence with medication.     Written patient instructions provided.  Total time in face to face counseling 50 minutes.  Follow up in Pharmacist Clinic Visit PRN.   Patient seen with Zettie Cooley, PharmD Candidate and Cruz Condon, PharmD Resident.

## 2016-05-03 NOTE — Patient Instructions (Addendum)
Thank you for coming in today! It was so nice to see you.  Decrease the amount of times per day that you check your blood sugar. Make sure that you check every morning, then recheck at least one other time during the day (lunch, dinner, bedtime). The second time can be whenever you like. Plan to now check blood sugar two times a day.  In addition, check blood sugar anytime you feel like you might be low.  Goal blood sugar is 100-140.  Call Dr. Valentina Lucks if there are multiple readings that are > 200.  If you want to exercise or go for a walk then do this after you eat. If you want to go for a walk before you eat then check your blood sugar before to make sure that it is not too low.  Continue to take insulin aspart 2 units with meals and lantus 4 units in the morning.  Change clonidine patch next Wednesday (05/08/16).  Next follow up is with Dr. Berkley Harvey in 1 month.

## 2016-05-03 NOTE — Progress Notes (Signed)
Patient ID: Philip Matthews, male   DOB: 06-25-1928, 82 y.o.   MRN: [id number] Reviewed: Agree with Dr. Graylin Shiver documentation and management.

## 2016-05-03 NOTE — Assessment & Plan Note (Signed)
Hypertension longstanding currently elevated at this visit most likely related to patch being 25 days old.   Replaced Clonidine patch 0.2mg  today in office.  Patient reports adherence with medication.

## 2016-05-06 DIAGNOSIS — N183 Chronic kidney disease, stage 3 (moderate): Secondary | ICD-10-CM | POA: Diagnosis not present

## 2016-05-06 DIAGNOSIS — N2581 Secondary hyperparathyroidism of renal origin: Secondary | ICD-10-CM | POA: Diagnosis not present

## 2016-05-07 ENCOUNTER — Ambulatory Visit (INDEPENDENT_AMBULATORY_CARE_PROVIDER_SITE_OTHER): Payer: Medicare Other | Admitting: Podiatry

## 2016-05-07 ENCOUNTER — Other Ambulatory Visit: Payer: Self-pay | Admitting: *Deleted

## 2016-05-07 ENCOUNTER — Encounter: Payer: Self-pay | Admitting: Podiatry

## 2016-05-07 DIAGNOSIS — B351 Tinea unguium: Secondary | ICD-10-CM | POA: Diagnosis not present

## 2016-05-07 DIAGNOSIS — M109 Gout, unspecified: Secondary | ICD-10-CM

## 2016-05-07 DIAGNOSIS — Q828 Other specified congenital malformations of skin: Secondary | ICD-10-CM | POA: Diagnosis not present

## 2016-05-07 DIAGNOSIS — M79676 Pain in unspecified toe(s): Secondary | ICD-10-CM | POA: Diagnosis not present

## 2016-05-07 NOTE — Progress Notes (Addendum)
Patient ID: Philip Matthews, male   DOB: [birth year], 82 y.o.   MRN: [id number] Complaint:  Visit Type: Patient returns to my office for continued preventative foot care services. Complaint: Patient states" my nails have grown long and thick and become painful to walk and wear shoes". The patient presents for preventative foot care services. No changes to ROS  Podiatric Exam: Vascular: dorsalis pedis and posterior tibial pulses are palpable bilateral. Capillary return is immediate. Temperature gradient is WNL. Skin turgor WNL  Sensorium: Normal Semmes Weinstein monofilament test. Normal tactile sensation bilaterally. Nail Exam: Pt has thick disfigured discolored nails with subungual debris noted bilateral entire nail hallux through fifth toenails Ulcer Exam: There is no evidence of ulcer or pre-ulcerative changes or infection. Orthopedic Exam: Muscle tone and strength are WNL. No limitations in general ROM. No crepitus or effusions noted. Foot type and digits show no abnormalities. Bony prominences are unremarkable. Skin: No infection or ulcers noted. Porokeratosis sub 5th metatarsal B/L  Diagnosis:  Onychomycosis, , Pain in right toe, pain in left toes, porokeratosis  Treatment & Plan Procedures and Treatment: Consent by patient was obtained for treatment procedures. The patient understood the discussion of treatment and procedures well. All questions were answered thoroughly reviewed. Debridement of mycotic and hypertrophic toenails, 1 through 5 bilateral and clearing of subungual debris. No ulceration, no infection noted.  Return Visit-Office Procedure: Patient instructed to return to the office for a follow up visit 3 months for continued evaluation and treatment.   Gardiner Barefoot DPM

## 2016-05-08 ENCOUNTER — Other Ambulatory Visit: Payer: Self-pay | Admitting: *Deleted

## 2016-05-08 ENCOUNTER — Telehealth: Payer: Self-pay | Admitting: *Deleted

## 2016-05-08 DIAGNOSIS — N183 Chronic kidney disease, stage 3 (moderate): Secondary | ICD-10-CM | POA: Diagnosis not present

## 2016-05-08 DIAGNOSIS — D631 Anemia in chronic kidney disease: Secondary | ICD-10-CM | POA: Diagnosis not present

## 2016-05-08 DIAGNOSIS — E1143 Type 2 diabetes mellitus with diabetic autonomic (poly)neuropathy: Secondary | ICD-10-CM

## 2016-05-08 DIAGNOSIS — I81 Portal vein thrombosis: Secondary | ICD-10-CM

## 2016-05-08 DIAGNOSIS — I129 Hypertensive chronic kidney disease with stage 1 through stage 4 chronic kidney disease, or unspecified chronic kidney disease: Secondary | ICD-10-CM | POA: Diagnosis not present

## 2016-05-08 DIAGNOSIS — E875 Hyperkalemia: Secondary | ICD-10-CM | POA: Diagnosis not present

## 2016-05-08 DIAGNOSIS — N2581 Secondary hyperparathyroidism of renal origin: Secondary | ICD-10-CM | POA: Diagnosis not present

## 2016-05-08 LAB — POCT INR: INR: 2.3

## 2016-05-08 MED ORDER — PRAVASTATIN SODIUM 40 MG PO TABS: 40.0000 mg | ORAL_TABLET | Freq: Every day | ORAL | Status: DC

## 2016-05-08 MED ORDER — COLCHICINE 0.6 MG PO CAPS: 0.3000 mg | ORAL_CAPSULE | Freq: Every day | ORAL | Status: DC

## 2016-05-08 MED ORDER — INSULIN LISPRO 100 UNIT/ML (KWIKPEN): PEN_INJECTOR | SUBCUTANEOUS | Status: DC

## 2016-05-08 MED ORDER — OMEPRAZOLE 20 MG PO CPDR: 20.0000 mg | DELAYED_RELEASE_CAPSULE | Freq: Every day | ORAL | Status: DC

## 2016-05-08 MED ORDER — INSULIN ASPART 100 UNIT/ML FLEXPEN: 2.0000 [IU] | PEN_INJECTOR | Freq: Three times a day (TID) | SUBCUTANEOUS | Status: DC

## 2016-05-08 NOTE — Telephone Encounter (Signed)
Patient and his daughter in nurse clinic needing refills for Novolog FlexPen, Omeprazole and Pravastatin.  Refill for insulin sent to pharmacy, patient is out of medication.  Derl Barrow, RN

## 2016-05-08 NOTE — Telephone Encounter (Signed)
Prior Authorization received from [employer] for [employer]. Formulary alternative is Humalog or Humulin.  Verbal order given by Dr. Ree Kida to change to Humalog. Derl Barrow, RN

## 2016-05-08 NOTE — Telephone Encounter (Signed)
Olivia Mackie with [facility] called to get verbal orders to recertify patient for help with filling his pill bottles and medication management at home.  Patient also needs a script for his novolog pens sent to the pharmacy.  Patient was given a sample while in the office but only has 15 units left.  Jazmin Hartsell,CMA

## 2016-05-15 ENCOUNTER — Other Ambulatory Visit: Payer: Self-pay | Admitting: *Deleted

## 2016-05-15 LAB — POCT INR: INR: 3.3

## 2016-05-22 ENCOUNTER — Other Ambulatory Visit: Payer: Self-pay | Admitting: *Deleted

## 2016-05-22 DIAGNOSIS — I808 Phlebitis and thrombophlebitis of other sites: Secondary | ICD-10-CM | POA: Diagnosis not present

## 2016-05-22 DIAGNOSIS — Z5181 Encounter for therapeutic drug level monitoring: Secondary | ICD-10-CM | POA: Diagnosis not present

## 2016-05-22 DIAGNOSIS — Z7901 Long term (current) use of anticoagulants: Secondary | ICD-10-CM | POA: Diagnosis not present

## 2016-05-22 DIAGNOSIS — M329 Systemic lupus erythematosus, unspecified: Secondary | ICD-10-CM | POA: Diagnosis not present

## 2016-05-22 DIAGNOSIS — N183 Chronic kidney disease, stage 3 (moderate): Secondary | ICD-10-CM | POA: Diagnosis not present

## 2016-05-22 DIAGNOSIS — E119 Type 2 diabetes mellitus without complications: Secondary | ICD-10-CM | POA: Diagnosis not present

## 2016-05-22 DIAGNOSIS — I129 Hypertensive chronic kidney disease with stage 1 through stage 4 chronic kidney disease, or unspecified chronic kidney disease: Secondary | ICD-10-CM | POA: Diagnosis not present

## 2016-05-22 LAB — POCT INR: INR: 2.2

## 2016-05-22 LAB — CBC AND DIFFERENTIAL
HEMATOCRIT: 37 % — AB (ref 41–53)
Hemoglobin: 11.4 g/dL — AB (ref 13.5–17.5)
PLATELETS: 138 10*3/uL — AB (ref 150–399)
WBC: 7.2 10*3/mL

## 2016-05-24 ENCOUNTER — Encounter: Payer: Self-pay | Admitting: Family Medicine

## 2016-05-24 LAB — IRON AND TIBC
Ferritin: 386
IRON SATURATION: 16
IRON: 29
MCV: 88.6
TIBC: 157

## 2016-06-04 ENCOUNTER — Encounter: Payer: Self-pay | Admitting: Family Medicine

## 2016-06-04 ENCOUNTER — Ambulatory Visit (INDEPENDENT_AMBULATORY_CARE_PROVIDER_SITE_OTHER): Payer: Medicare Other | Admitting: Family Medicine

## 2016-06-04 VITALS — BP 146/67 | HR 72 | Temp 98.4°F | Ht 64.0 in | Wt 133.0 lb

## 2016-06-04 DIAGNOSIS — N183 Chronic kidney disease, stage 3 unspecified: Secondary | ICD-10-CM

## 2016-06-04 DIAGNOSIS — M1000 Idiopathic gout, unspecified site: Secondary | ICD-10-CM | POA: Diagnosis not present

## 2016-06-04 DIAGNOSIS — M109 Gout, unspecified: Secondary | ICD-10-CM

## 2016-06-04 DIAGNOSIS — M79641 Pain in right hand: Secondary | ICD-10-CM

## 2016-06-04 DIAGNOSIS — M10012 Idiopathic gout, left shoulder: Secondary | ICD-10-CM | POA: Diagnosis not present

## 2016-06-04 DIAGNOSIS — R945 Abnormal results of liver function studies: Secondary | ICD-10-CM

## 2016-06-04 DIAGNOSIS — R799 Abnormal finding of blood chemistry, unspecified: Secondary | ICD-10-CM | POA: Diagnosis not present

## 2016-06-04 DIAGNOSIS — E1143 Type 2 diabetes mellitus with diabetic autonomic (poly)neuropathy: Secondary | ICD-10-CM

## 2016-06-04 DIAGNOSIS — M25512 Pain in left shoulder: Secondary | ICD-10-CM

## 2016-06-04 DIAGNOSIS — R7989 Other specified abnormal findings of blood chemistry: Secondary | ICD-10-CM

## 2016-06-04 LAB — COMPLETE METABOLIC PANEL WITH GFR
ALBUMIN: 3.8 g/dL (ref 3.6–5.1)
ALT: 38 U/L (ref 9–46)
AST: 49 U/L — AB (ref 10–35)
Alkaline Phosphatase: 70 U/L (ref 40–115)
BILIRUBIN TOTAL: 0.2 mg/dL (ref 0.2–1.2)
BUN: 74 mg/dL — ABNORMAL HIGH (ref 7–25)
CALCIUM: 8.9 mg/dL (ref 8.6–10.3)
CHLORIDE: 102 mmol/L (ref 98–110)
CO2: 28 mmol/L (ref 20–31)
CREATININE: 1.76 mg/dL — AB (ref 0.70–1.11)
GFR, EST AFRICAN AMERICAN: 40 mL/min — AB (ref >=60)
GFR, Est Non African American: 34 mL/min — ABNORMAL LOW (ref >=60)
Glucose, Bld: 91 mg/dL (ref 65–99)
Potassium: 4.6 mmol/L (ref 3.5–5.3)
Sodium: 140 mmol/L (ref 135–146)
TOTAL PROTEIN: 6.8 g/dL (ref 6.1–8.1)

## 2016-06-04 LAB — CBC
HEMATOCRIT: 34.6 % — AB (ref 38.5–50.0)
Hemoglobin: 10.6 g/dL — ABNORMAL LOW (ref 13.2–17.1)
MCH: 26.6 pg — ABNORMAL LOW (ref 27.0–33.0)
MCHC: 30.6 g/dL — AB (ref 32.0–36.0)
MCV: 86.9 fL (ref 80.0–100.0)
MPV: 11 fL (ref 7.5–12.5)
Platelets: 169 10*3/uL (ref 140–400)
RBC: 3.98 MIL/uL — ABNORMAL LOW (ref 4.20–5.80)
RDW: 16.1 % — AB (ref 11.0–15.0)
WBC: 6.9 10*3/uL (ref 3.8–10.8)

## 2016-06-04 LAB — POCT GLYCOSYLATED HEMOGLOBIN (HGB A1C): Hemoglobin A1C: 7.2

## 2016-06-04 LAB — URIC ACID: URIC ACID, SERUM: 4.9 mg/dL (ref 4.0–8.0)

## 2016-06-04 MED ORDER — PREDNISONE 10 MG PO TABS: 10.0000 mg | ORAL_TABLET | Freq: Every day | ORAL | Status: DC

## 2016-06-04 NOTE — Progress Notes (Signed)
  Patient name: Philip Matthews MRN [id number]  Date of birth: [birth year]  CC & HPI:  Philip Matthews is a 81 y.o. male presenting today for Left shoulder pain and right hand swelling.   Left shoulder pain - Continues to complain of intermittent recurrent left shoulder pain.  Diffuse achy pain.  Worse with activity.  - Not associated with left arm weakness or numbness - Denies neck pain - Continues to take Tylenol 650 when necessary; usually 1-2 pills daily.  Also endorses occasional ibuprofen - Reports compliance with allopurinol and colchicine - Does endorse improvement after last steroid injection, and questions how often these can be given - Denies any new injuries or trauma  Right hand swelling - Reports 3-4 days of right hand swelling - Denies any inciting trauma  - Denies worsening pain - No new medications  Smoking History Noted  Objective Findings:  Vitals: BP 146/67 mmHg  Pulse 72  Temp(Src) 98.4 F (36.9 C) (Oral)  Ht 5\' 4"  (1.626 m)  Wt 133 lb (60.328 kg)  BMI 22.82 kg/m2  Gen: NAD Left shoulder - No obvious swelling or erythema - Diffuse tenderness to palpation - Active range of motion limited by pain - Passive range of motion limited to shoulder abduction 80 degres; external rotation 30 - Strength and sensation intact Right Hand - Diffuse swelling over the MCP joints - No erythema or warmth - No tenderness  Assessment & Plan:   Chronic kidney disease (CKD), stage III (moderate) Check CBC, BMP given NSAIDs use  Gout Check uric acid  - Continue allopurinol 100 mg daily - Continue colchicine 0.3 mg daily  Left shoulder pain Most likely multifactorial due to underlying arthritis with possible complications due to gout limited passive range of motion, concerning for adhesive capsulitis - Prednisone 10 mg daily 5 days; used low dose steroids due to elevated A1c at last check - Recommended against NSAIDs given CKD - Continue Tylenol 650 every 6 hours  when necessary - Follow-up in 3 weeks; he will call after completing steroids if wants referral to sports medicine for injection - Consider referral to sports medicine for ultrasound guided glenohumeral and subacromial steroid injections if not improvement with oral prednisone - Consider PT to help wit ROM exercises   Right hand pain Right hand swelling with minimal increase in baseline pain was likely secondary to arthritis possibly complicated by gout - Prednisone 10mg  qd x 5 days - recheck uric acid and treat as indicated

## 2016-06-04 NOTE — Assessment & Plan Note (Addendum)
Most likely multifactorial due to underlying arthritis with possible complications due to gout limited passive range of motion, concerning for adhesive capsulitis - Prednisone 10 mg daily 5 days; used low dose steroids due to elevated A1c at last check - Recommended against NSAIDs given CKD - Continue Tylenol 650 every 6 hours when necessary - Follow-up in 3 weeks; he will call after completing steroids if wants referral to sports medicine for injection - Consider referral to sports medicine for ultrasound guided glenohumeral and subacromial steroid injections if not improvement with oral prednisone - Consider PT to help wit ROM exercises

## 2016-06-04 NOTE — Patient Instructions (Addendum)
Your left shoulder pain is most likely due to combination of arthritis and gout.  - Continue Tylenol 650 every 6 hours as needed - Take prednisone 10 mg once a day for 5 days - Call if your shoulder pain is not improved in 5 days and I will refer you to sports medicine for a steroid injection  Bring all your medications in your diabetes meter to your next appointment

## 2016-06-04 NOTE — Assessment & Plan Note (Signed)
Check uric acid  - Continue allopurinol 100 mg daily - Continue colchicine 0.3 mg daily

## 2016-06-04 NOTE — Assessment & Plan Note (Signed)
Check CBC, BMP given NSAIDs use

## 2016-06-04 NOTE — Assessment & Plan Note (Signed)
Right hand swelling with minimal increase in baseline pain was likely secondary to arthritis possibly complicated by gout - Prednisone 10mg  qd x 5 days - recheck uric acid and treat as indicated

## 2016-06-05 ENCOUNTER — Other Ambulatory Visit: Payer: Self-pay | Admitting: *Deleted

## 2016-06-05 DIAGNOSIS — N183 Chronic kidney disease, stage 3 (moderate): Secondary | ICD-10-CM | POA: Diagnosis not present

## 2016-06-05 LAB — POCT INR: INR: 2.7

## 2016-06-05 MED ORDER — ALLOPURINOL 100 MG PO TABS: 100.0000 mg | ORAL_TABLET | Freq: Every day | ORAL | Status: DC

## 2016-06-10 ENCOUNTER — Other Ambulatory Visit: Payer: Self-pay | Admitting: Family Medicine

## 2016-06-10 ENCOUNTER — Telehealth: Payer: Self-pay | Admitting: *Deleted

## 2016-06-10 ENCOUNTER — Telehealth: Payer: Self-pay | Admitting: Family Medicine

## 2016-06-10 DIAGNOSIS — E1143 Type 2 diabetes mellitus with diabetic autonomic (poly)neuropathy: Secondary | ICD-10-CM

## 2016-06-10 MED ORDER — INSULIN GLARGINE 100 UNITS/ML SOLOSTAR PEN: 4.0000 [IU] | PEN_INJECTOR | Freq: Every day | SUBCUTANEOUS | Status: DC

## 2016-06-10 NOTE — Telephone Encounter (Signed)
Received refill request for Lantus SoloStar Pen. Medication is not listed on current med list.  Fax request placed in Carrieanne Kleen box for review.  Derl Barrow, RN

## 2016-06-10 NOTE — Telephone Encounter (Signed)
Nurse with [facility] calls, patient is running out of Humalog and needles. Nurse states he has had to take more and has run out early. Pharmacy will not let pt refill without authorization from MD. Please advise.

## 2016-06-11 ENCOUNTER — Telehealth: Payer: Self-pay | Admitting: Family Medicine

## 2016-06-11 ENCOUNTER — Other Ambulatory Visit: Payer: Self-pay | Admitting: Family Medicine

## 2016-06-11 DIAGNOSIS — E1143 Type 2 diabetes mellitus with diabetic autonomic (poly)neuropathy: Secondary | ICD-10-CM

## 2016-06-11 MED ORDER — INSULIN LISPRO 100 UNIT/ML (KWIKPEN): PEN_INJECTOR | SUBCUTANEOUS | Status: DC

## 2016-06-11 MED ORDER — INSULIN PEN NEEDLE 32G X 6 MM MISC: Status: DC

## 2016-06-11 NOTE — Telephone Encounter (Signed)
Will route to MD

## 2016-06-12 NOTE — Telephone Encounter (Signed)
Tried calling pharmacy multiple times with no answer.  Will try calling again later. Jazmin Hartsell,CMA

## 2016-06-12 NOTE — Telephone Encounter (Signed)
Dr. Carole Civil to discuss request for Humalog refill but he was unavailable. Pharmacy reports he received a 50 day supply on 05/08/16, and therefore his insurance company is refusing to pay for an early refill. Please find out how much insulin he has been taken and is currently taking. Has he lost insulin? I have placed a new order for Humalog with a sliding scale, allowing him to take 2-10 units as needed. I did this and hopes that his insurance company would provide an early refill, as we are adjusting his medication; HOWEVER, I only want him to take 2 units of Humalog with meals until advised by Dr. Valentina Lucks whether he needs to increase  I spoke with patient's daughter and she is aware of this.  New script has been picked up and confirmed instructions.  Plan to call pharmacy to inform them that lantus needs to read take 4 units before breakfast according to Dr. Graylin Shiver last note.  Jazmin Hartsell,CMA

## 2016-06-12 NOTE — Progress Notes (Signed)
Spoke with daughter and she has already picked up this script for patient.  States that patient was taking 2 units with breakfast and dinner and 4 units at lunch.  I advised her of instructions for only 2 units 3 times a day at meals.  She states that patient was instructed to take him lantus in the morning 4 units, but his script bottle says to take at dinner.  Informed daughter that I would call pharmacy and make sure that they know that lantus is supposed to be taken in the morning. Kerem Gilmer,CMA

## 2016-06-19 ENCOUNTER — Other Ambulatory Visit: Payer: Self-pay | Admitting: *Deleted

## 2016-06-19 DIAGNOSIS — I808 Phlebitis and thrombophlebitis of other sites: Secondary | ICD-10-CM | POA: Diagnosis not present

## 2016-06-19 DIAGNOSIS — E119 Type 2 diabetes mellitus without complications: Secondary | ICD-10-CM | POA: Diagnosis not present

## 2016-06-19 DIAGNOSIS — N183 Chronic kidney disease, stage 3 (moderate): Secondary | ICD-10-CM | POA: Diagnosis not present

## 2016-06-19 DIAGNOSIS — M329 Systemic lupus erythematosus, unspecified: Secondary | ICD-10-CM | POA: Diagnosis not present

## 2016-06-19 DIAGNOSIS — Z7901 Long term (current) use of anticoagulants: Secondary | ICD-10-CM | POA: Diagnosis not present

## 2016-06-19 DIAGNOSIS — Z5181 Encounter for therapeutic drug level monitoring: Secondary | ICD-10-CM | POA: Diagnosis not present

## 2016-06-19 LAB — CBC AND DIFFERENTIAL
HCT: 31 % — AB (ref 41–53)
HEMOGLOBIN: 9.6 g/dL — AB (ref 13.5–17.5)
Platelets: 133 10*3/uL — AB (ref 150–399)
WBC: 7.3 10^3/mL

## 2016-06-19 LAB — POCT INR: INR: 2.7

## 2016-06-20 ENCOUNTER — Encounter: Payer: Self-pay | Admitting: Family Medicine

## 2016-06-20 LAB — IRON AND TIBC
%SAT: 27
Ferritin: 378
Iron: 52
MCV: 85.5
TIBC: 193

## 2016-06-24 ENCOUNTER — Encounter: Payer: Self-pay | Admitting: Family Medicine

## 2016-06-24 ENCOUNTER — Ambulatory Visit (INDEPENDENT_AMBULATORY_CARE_PROVIDER_SITE_OTHER): Payer: Medicare Other | Admitting: Family Medicine

## 2016-06-24 VITALS — BP 134/68 | HR 72 | Temp 98.0°F | Ht 64.0 in | Wt 136.0 lb

## 2016-06-24 DIAGNOSIS — M109 Gout, unspecified: Secondary | ICD-10-CM

## 2016-06-24 DIAGNOSIS — M25512 Pain in left shoulder: Secondary | ICD-10-CM

## 2016-06-24 DIAGNOSIS — M10012 Idiopathic gout, left shoulder: Secondary | ICD-10-CM | POA: Diagnosis not present

## 2016-06-24 DIAGNOSIS — E1143 Type 2 diabetes mellitus with diabetic autonomic (poly)neuropathy: Secondary | ICD-10-CM | POA: Diagnosis present

## 2016-06-24 DIAGNOSIS — M79641 Pain in right hand: Secondary | ICD-10-CM | POA: Diagnosis not present

## 2016-06-24 NOTE — Assessment & Plan Note (Signed)
Last A1c at goal less than 8; however CBGs over the past few weeks have been 130s to 300 - Continue Lantus 4 units every morning and Humalog 2 units 3 times a day before meals - Check blood sugars every morning and 2 hour postprandial for lunch - f/u with Dr Valentina Lucks in 2-4 weeks for diabetes

## 2016-06-24 NOTE — Assessment & Plan Note (Signed)
Likely contributing to his chronic MSK pain on top of osteoarthritis - Uric acid under 5 at last check - Continue allopurinol 100 mg daily and colchicine 0.3 mg daily due to CKD.

## 2016-06-24 NOTE — Progress Notes (Signed)
  Patient name: Philip Matthews MRN [id number]  Date of birth: Nov 10, 1928  CC & HPI:  Philip Matthews is a 82 y.o. male presenting today for DM, left shoulder pain, right hand pain.   DIABETES   Blood Sugar Ranges: 130-300  Symptoms of Hyperglycemia? no  Symptoms of Hypoglycemia? no  Comorbid Symptoms:   Chest pain: no; SOB: no   Medication Compliance: yes   Medication Side Effects: No Hypoglycemia  Left shoulder pain - He reports improvement in his left shoulder pain after steroid - Continues to have some pain with daily activity and limited range of motion that is currently well-controlled with when necessary Tylenol - He is interested in starting physical therapy to help with range of motion and pain  Right hand pain - He reports improvement but not complete resolution of right hand swelling - Right hand pain much improved after steroids - He continues to take allopurinol and colchicine  Smoking History Noted  Objective Findings:  Vitals: BP 134/68 mmHg  Pulse 72  Temp(Src) 98 F (36.7 C) (Oral)  Ht 5\' 4"  (1.626 m)  Wt 136 lb (61.689 kg)  BMI 23.33 kg/m2  Gen: NAD CV: RRR w/o m/r/g, pulses +2 b/l Resp: CTAB w/ normal respiratory effort MSK: Left shoulder range of motion is severely limited; improvement in right MCP swelling  Assessment & Plan:   Type II diabetes mellitus, controlled but with peripheral neuropathy Last A1c at goal less than 8; however CBGs over the past few weeks have been 130s to 300 - Continue Lantus 4 units every morning and Humalog 2 units 3 times a day before meals - Check blood sugars every morning and 2 hour postprandial for lunch - f/u with Dr Valentina Lucks in 2-4 weeks for diabetes  Gout Likely contributing to his chronic MSK pain on top of osteoarthritis - Uric acid under 5 at last check - Continue allopurinol 100 mg daily and colchicine 0.3 mg daily due to CKD.    Left shoulder pain Improvement in acute exacerbation of chronic left  shoulder pain.  Due to recurrent exacerbations of pain and severely limited range of motion will refer to physical therapy.  Will attempt to establish with home health physical therapy due to difficulty making appointments secondary to having to communicate with driver's via Sign language and needing daughter to be present - HHPT - If continues to have recurrent exacerbations will repeat x-rays and consider referral to sports medicine  Right hand pain Significant improvement in pain and swelling, but continues to have some swelling along the MCP joints - He is happy with when necessary Tylenol for pain control - Discussed x-rays, but he declines at this time - Follow-up prn

## 2016-06-24 NOTE — Assessment & Plan Note (Signed)
Improvement in acute exacerbation of chronic left shoulder pain.  Due to recurrent exacerbations of pain and severely limited range of motion will refer to physical therapy.  Will attempt to establish with home health physical therapy due to difficulty making appointments secondary to having to communicate with driver's via Sign language and needing daughter to be present - HHPT - If continues to have recurrent exacerbations will repeat x-rays and consider referral to sports medicine

## 2016-06-24 NOTE — Assessment & Plan Note (Signed)
Significant improvement in pain and swelling, but continues to have some swelling along the MCP joints - He is happy with when necessary Tylenol for pain control - Discussed x-rays, but he declines at this time - Follow-up prn

## 2016-06-24 NOTE — Patient Instructions (Addendum)
Your blood sugars and blood pressures are under excellent control.   Diabetes - Continue taking Lantus 4 units every morning - Continue taking NovoLog 2 units before every meal - Check your blood sugar every morning prior to eating or taking medications - Check your blood sugar 2 hours after your biggest no - Bring your meter and logbook to your appointment with Dr. Valentina Lucks in 2-4 weeks  Philip Matthews of your shoulder or right hand pain return and we will get x-rays

## 2016-07-03 ENCOUNTER — Other Ambulatory Visit: Payer: Self-pay | Admitting: *Deleted

## 2016-07-03 DIAGNOSIS — D649 Anemia, unspecified: Secondary | ICD-10-CM | POA: Diagnosis not present

## 2016-07-03 LAB — POCT INR: INR: 1.9

## 2016-07-17 ENCOUNTER — Telehealth: Payer: Self-pay | Admitting: *Deleted

## 2016-07-17 ENCOUNTER — Other Ambulatory Visit: Payer: Self-pay | Admitting: *Deleted

## 2016-07-17 DIAGNOSIS — N183 Chronic kidney disease, stage 3 (moderate): Secondary | ICD-10-CM | POA: Diagnosis not present

## 2016-07-17 DIAGNOSIS — D649 Anemia, unspecified: Secondary | ICD-10-CM | POA: Diagnosis not present

## 2016-07-17 LAB — POCT INR: INR: 2.5

## 2016-07-17 NOTE — Telephone Encounter (Signed)
Tracey from [facility] calling in needing a verbal order to fill pill boxes, draw blood, and INR on pt. Please call back and give her the verbal order Delray Alt, CMA

## 2016-07-17 NOTE — Telephone Encounter (Signed)
Will forward to MD to give the verbal ok. Meeah Totino,CMA

## 2016-07-18 NOTE — Telephone Encounter (Signed)
Spoke with Olivia Mackie at [facility] Verbal order given.

## 2016-07-31 ENCOUNTER — Other Ambulatory Visit: Payer: Self-pay | Admitting: Family Medicine

## 2016-07-31 ENCOUNTER — Other Ambulatory Visit: Payer: Self-pay | Admitting: *Deleted

## 2016-07-31 DIAGNOSIS — N183 Chronic kidney disease, stage 3 (moderate): Secondary | ICD-10-CM | POA: Diagnosis not present

## 2016-07-31 LAB — POCT INR: INR: 2.5

## 2016-08-13 ENCOUNTER — Encounter: Payer: Self-pay | Admitting: Podiatry

## 2016-08-13 ENCOUNTER — Ambulatory Visit (INDEPENDENT_AMBULATORY_CARE_PROVIDER_SITE_OTHER): Payer: Medicare Other | Admitting: Podiatry

## 2016-08-13 DIAGNOSIS — M79676 Pain in unspecified toe(s): Secondary | ICD-10-CM | POA: Diagnosis not present

## 2016-08-13 DIAGNOSIS — M2042 Other hammer toe(s) (acquired), left foot: Secondary | ICD-10-CM

## 2016-08-13 DIAGNOSIS — E1143 Type 2 diabetes mellitus with diabetic autonomic (poly)neuropathy: Secondary | ICD-10-CM | POA: Diagnosis not present

## 2016-08-13 DIAGNOSIS — Q828 Other specified congenital malformations of skin: Secondary | ICD-10-CM

## 2016-08-13 DIAGNOSIS — M2040 Other hammer toe(s) (acquired), unspecified foot: Secondary | ICD-10-CM

## 2016-08-13 DIAGNOSIS — L84 Corns and callosities: Secondary | ICD-10-CM

## 2016-08-13 DIAGNOSIS — B351 Tinea unguium: Secondary | ICD-10-CM | POA: Diagnosis not present

## 2016-08-13 DIAGNOSIS — M2041 Other hammer toe(s) (acquired), right foot: Secondary | ICD-10-CM | POA: Diagnosis not present

## 2016-08-13 NOTE — Progress Notes (Signed)
Patient ID: Philip Matthews, male   DOB: [birth year], 82 y.o.   MRN: [id number]  Patient presents for diabetic shoe pick up, shoes are tried on for good fit.  Patient received 1 pair Orthofeet 481 Highline boot in men's 7.5 wide and 3 pairs custom molded diabetic inserts.  Verbal and written break in and wear instructions given.  Patient will follow up for scheduled routine care.

## 2016-08-13 NOTE — Progress Notes (Addendum)
Patient ID: Philip Matthews, male   DOB: 09/12/1928, 82 y.o.   MRN: [id number] Complaint:  Visit Type: Patient returns to my office for continued preventative foot care services. Complaint: Patient states" my nails have grown long and thick and become painful to walk and wear shoes". The patient presents for preventative foot care services. No changes to ROS.  Patient is diabetic with peripheral neuropathy.  Podiatric Exam: Vascular: dorsalis pedis and posterior tibial pulses are palpable bilateral. Capillary return is immediate. Temperature gradient is WNL. Skin turgor WNL  Sensorium: Normal Semmes Weinstein monofilament test. Normal tactile sensation bilaterally. Nail Exam: Pt has thick disfigured discolored nails with subungual debris noted bilateral entire nail hallux through fifth toenails Ulcer Exam: There is no evidence of ulcer or pre-ulcerative changes or infection. Orthopedic Exam: Muscle tone and strength are WNL. No limitations in general ROM. No crepitus or effusions noted. Foot type and digits show no abnormalities. Bony prominences are unremarkable. Skin: No infection or ulcers noted. Porokeratosis sub 5th metatarsal B/L  Diagnosis:  Onychomycosis, , Pain in right toe, pain in left toes, porokeratosis  Treatment & Plan Procedures and Treatment: Consent by patient was obtained for treatment procedures. The patient understood the discussion of treatment and procedures well. All questions were answered thoroughly reviewed. Debridement of mycotic and hypertrophic toenails, 1 through 5 bilateral and clearing of subungual debris. No ulceration, no infection noted. Dispense diabetic shoes.  Patient presents today and was dispensed 0ne pair ( two units) of medically necessary extra depth shoes with three pair( six units) of custom molded multiple density inserts. The shoes and the inserts are fitted to the patients ' feet and are noted to fit well and are free of defect.  Length and width of the  shoes are also acceptable.  Patient was given written and verbal  instructions for wearing.  If any concerns arrive with the shoes or inserts, the patient is to call the office.Patient is to follow up with doctor in six weeks. Return Visit-Office Procedure: Patient instructed to return to the office for a follow up visit 3 months for continued evaluation and treatment.   Gardiner Barefoot DPM

## 2016-08-13 NOTE — Patient Instructions (Signed)

## 2016-08-14 ENCOUNTER — Other Ambulatory Visit: Payer: Self-pay | Admitting: *Deleted

## 2016-08-14 ENCOUNTER — Other Ambulatory Visit: Payer: Self-pay | Admitting: Family Medicine

## 2016-08-14 DIAGNOSIS — Z5181 Encounter for therapeutic drug level monitoring: Secondary | ICD-10-CM | POA: Diagnosis not present

## 2016-08-14 DIAGNOSIS — Z7901 Long term (current) use of anticoagulants: Secondary | ICD-10-CM | POA: Diagnosis not present

## 2016-08-14 DIAGNOSIS — M329 Systemic lupus erythematosus, unspecified: Secondary | ICD-10-CM | POA: Diagnosis not present

## 2016-08-14 DIAGNOSIS — N183 Chronic kidney disease, stage 3 (moderate): Secondary | ICD-10-CM | POA: Diagnosis not present

## 2016-08-14 DIAGNOSIS — I808 Phlebitis and thrombophlebitis of other sites: Secondary | ICD-10-CM | POA: Diagnosis not present

## 2016-08-14 DIAGNOSIS — I129 Hypertensive chronic kidney disease with stage 1 through stage 4 chronic kidney disease, or unspecified chronic kidney disease: Secondary | ICD-10-CM | POA: Diagnosis not present

## 2016-08-14 DIAGNOSIS — E119 Type 2 diabetes mellitus without complications: Secondary | ICD-10-CM | POA: Diagnosis not present

## 2016-08-14 LAB — POCT INR: INR: 2.9

## 2016-08-27 ENCOUNTER — Other Ambulatory Visit (HOSPITAL_COMMUNITY): Payer: Self-pay | Admitting: *Deleted

## 2016-08-28 ENCOUNTER — Ambulatory Visit (HOSPITAL_COMMUNITY)
Admission: RE | Admit: 2016-08-28 | Discharge: 2016-08-28 | Disposition: A | Discharge status: Home / Self Care | Payer: Medicare Other | Source: Ambulatory Visit | Attending: Nephrology | Admitting: Nephrology

## 2016-08-28 ENCOUNTER — Other Ambulatory Visit: Payer: Self-pay | Admitting: Family Medicine

## 2016-08-28 ENCOUNTER — Other Ambulatory Visit: Payer: Self-pay | Admitting: *Deleted

## 2016-08-28 DIAGNOSIS — D509 Iron deficiency anemia, unspecified: Secondary | ICD-10-CM | POA: Diagnosis not present

## 2016-08-28 DIAGNOSIS — N183 Chronic kidney disease, stage 3 (moderate): Secondary | ICD-10-CM | POA: Diagnosis not present

## 2016-08-28 LAB — POCT INR: INR: 2.8

## 2016-08-28 MED ORDER — SODIUM CHLORIDE 0.9 % IV SOLN
510.0000 mg | INTRAVENOUS | Status: DC
  Administered 2016-08-28: 510 mg via INTRAVENOUS
  Filled 2016-08-28: qty 17

## 2016-09-04 ENCOUNTER — Encounter (HOSPITAL_COMMUNITY): Payer: Self-pay

## 2016-09-04 ENCOUNTER — Ambulatory Visit (HOSPITAL_COMMUNITY)
Admission: RE | Admit: 2016-09-04 | Discharge: 2016-09-04 | Disposition: A | Discharge status: Home / Self Care | Payer: Medicare Other | Source: Ambulatory Visit | Attending: Nephrology | Admitting: Nephrology

## 2016-09-04 ENCOUNTER — Other Ambulatory Visit: Payer: Self-pay | Admitting: *Deleted

## 2016-09-04 DIAGNOSIS — D509 Iron deficiency anemia, unspecified: Secondary | ICD-10-CM | POA: Insufficient documentation

## 2016-09-04 DIAGNOSIS — K5900 Constipation, unspecified: Secondary | ICD-10-CM

## 2016-09-04 MED ORDER — SODIUM CHLORIDE 0.9 % IV SOLN
510.0000 mg | Freq: Once | INTRAVENOUS | Status: AC
  Administered 2016-09-04: 510 mg via INTRAVENOUS
  Filled 2016-09-04: qty 17

## 2016-09-04 NOTE — Progress Notes (Signed)
Pt tolerated feraheme well. Denies any discomfort or signs of reaction. Left ambulatory.  Interpretor with pt throughout visit  Jacki Cones  [facility]

## 2016-09-05 MED ORDER — POLYETHYLENE GLYCOL 3350 17 GM/SCOOP PO POWD: 17.0000 g | Freq: Two times a day (BID) | ORAL | 6 refills | Status: DC

## 2016-09-05 NOTE — Telephone Encounter (Signed)
Medication refilled

## 2016-09-11 ENCOUNTER — Other Ambulatory Visit: Payer: Self-pay | Admitting: *Deleted

## 2016-09-11 DIAGNOSIS — E119 Type 2 diabetes mellitus without complications: Secondary | ICD-10-CM | POA: Diagnosis not present

## 2016-09-11 DIAGNOSIS — M329 Systemic lupus erythematosus, unspecified: Secondary | ICD-10-CM | POA: Diagnosis not present

## 2016-09-11 DIAGNOSIS — N183 Chronic kidney disease, stage 3 (moderate): Secondary | ICD-10-CM | POA: Diagnosis not present

## 2016-09-11 DIAGNOSIS — Z7901 Long term (current) use of anticoagulants: Secondary | ICD-10-CM | POA: Diagnosis not present

## 2016-09-11 DIAGNOSIS — I808 Phlebitis and thrombophlebitis of other sites: Secondary | ICD-10-CM | POA: Diagnosis not present

## 2016-09-11 DIAGNOSIS — Z5181 Encounter for therapeutic drug level monitoring: Secondary | ICD-10-CM | POA: Diagnosis not present

## 2016-09-11 LAB — POCT INR: INR: 1.3

## 2016-09-16 ENCOUNTER — Other Ambulatory Visit (INDEPENDENT_AMBULATORY_CARE_PROVIDER_SITE_OTHER): Payer: Medicare Other | Admitting: Internal Medicine

## 2016-09-16 DIAGNOSIS — I81 Portal vein thrombosis: Secondary | ICD-10-CM

## 2016-09-16 LAB — POCT INR: INR: 1.7

## 2016-09-16 NOTE — Progress Notes (Signed)
[facility] call to report pt INR, which was 1.7. Pt hasn't had any missed doses, bleeding, or ER visits. Per Dr Mingo Amber pt is to take 7.5mg  Monday, Wednesday, Friday; 5mg  other days. Nurse states that she is going to visit pt again on 09/25/16. Gyan Cambre Kennon Holter, CMA

## 2016-09-17 ENCOUNTER — Telehealth: Payer: Self-pay | Admitting: General Practice

## 2016-09-17 NOTE — Telephone Encounter (Signed)
Spoke to Olivia Mackie, Nurse w/ [facility] requesting a verbal order for re certification to continue care  Call back # : [phone]

## 2016-09-19 NOTE — Telephone Encounter (Signed)
Returned call to Olivia Mackie- left a voicemail with a verbal order to continue care. She should give [place] a call back with any concerns.

## 2016-09-25 ENCOUNTER — Other Ambulatory Visit: Payer: Self-pay | Admitting: *Deleted

## 2016-09-25 ENCOUNTER — Other Ambulatory Visit: Payer: Self-pay | Admitting: Family Medicine

## 2016-09-25 DIAGNOSIS — I129 Hypertensive chronic kidney disease with stage 1 through stage 4 chronic kidney disease, or unspecified chronic kidney disease: Secondary | ICD-10-CM | POA: Diagnosis not present

## 2016-09-25 DIAGNOSIS — N183 Chronic kidney disease, stage 3 (moderate): Secondary | ICD-10-CM | POA: Diagnosis not present

## 2016-09-25 LAB — POCT INR: INR: 1.8

## 2016-10-02 ENCOUNTER — Other Ambulatory Visit: Payer: Self-pay | Admitting: *Deleted

## 2016-10-02 LAB — POCT INR: INR: 1.3

## 2016-10-04 DIAGNOSIS — E119 Type 2 diabetes mellitus without complications: Secondary | ICD-10-CM | POA: Diagnosis not present

## 2016-10-04 DIAGNOSIS — H401124 Primary open-angle glaucoma, left eye, indeterminate stage: Secondary | ICD-10-CM | POA: Diagnosis not present

## 2016-10-04 DIAGNOSIS — H401114 Primary open-angle glaucoma, right eye, indeterminate stage: Secondary | ICD-10-CM | POA: Diagnosis not present

## 2016-10-04 DIAGNOSIS — H52203 Unspecified astigmatism, bilateral: Secondary | ICD-10-CM | POA: Diagnosis not present

## 2016-10-04 LAB — HM DIABETES EYE EXAM

## 2016-10-09 ENCOUNTER — Other Ambulatory Visit: Payer: Self-pay | Admitting: *Deleted

## 2016-10-09 DIAGNOSIS — I808 Phlebitis and thrombophlebitis of other sites: Secondary | ICD-10-CM | POA: Diagnosis not present

## 2016-10-09 DIAGNOSIS — Z7901 Long term (current) use of anticoagulants: Secondary | ICD-10-CM | POA: Diagnosis not present

## 2016-10-09 DIAGNOSIS — I129 Hypertensive chronic kidney disease with stage 1 through stage 4 chronic kidney disease, or unspecified chronic kidney disease: Secondary | ICD-10-CM | POA: Diagnosis not present

## 2016-10-09 DIAGNOSIS — E119 Type 2 diabetes mellitus without complications: Secondary | ICD-10-CM | POA: Diagnosis not present

## 2016-10-09 DIAGNOSIS — M329 Systemic lupus erythematosus, unspecified: Secondary | ICD-10-CM | POA: Diagnosis not present

## 2016-10-09 DIAGNOSIS — N183 Chronic kidney disease, stage 3 (moderate): Secondary | ICD-10-CM | POA: Diagnosis not present

## 2016-10-09 DIAGNOSIS — Z5181 Encounter for therapeutic drug level monitoring: Secondary | ICD-10-CM | POA: Diagnosis not present

## 2016-10-09 LAB — POCT INR: INR: 2.8

## 2016-10-16 ENCOUNTER — Other Ambulatory Visit: Payer: Self-pay | Admitting: *Deleted

## 2016-10-16 LAB — POCT INR: INR: 2.8

## 2016-10-23 ENCOUNTER — Ambulatory Visit (INDEPENDENT_AMBULATORY_CARE_PROVIDER_SITE_OTHER): Payer: Medicare Other | Admitting: Internal Medicine

## 2016-10-23 ENCOUNTER — Encounter: Payer: Self-pay | Admitting: Internal Medicine

## 2016-10-23 ENCOUNTER — Telehealth: Payer: Self-pay | Admitting: Internal Medicine

## 2016-10-23 VITALS — BP 142/80 | HR 66 | Temp 97.9°F | Wt 133.0 lb

## 2016-10-23 DIAGNOSIS — I10 Essential (primary) hypertension: Secondary | ICD-10-CM | POA: Diagnosis not present

## 2016-10-23 DIAGNOSIS — N183 Chronic kidney disease, stage 3 unspecified: Secondary | ICD-10-CM

## 2016-10-23 DIAGNOSIS — E782 Mixed hyperlipidemia: Secondary | ICD-10-CM | POA: Diagnosis not present

## 2016-10-23 DIAGNOSIS — R7989 Other specified abnormal findings of blood chemistry: Secondary | ICD-10-CM | POA: Diagnosis not present

## 2016-10-23 DIAGNOSIS — E7849 Other hyperlipidemia: Secondary | ICD-10-CM

## 2016-10-23 DIAGNOSIS — Z7901 Long term (current) use of anticoagulants: Secondary | ICD-10-CM | POA: Insufficient documentation

## 2016-10-23 DIAGNOSIS — D631 Anemia in chronic kidney disease: Secondary | ICD-10-CM | POA: Diagnosis not present

## 2016-10-23 DIAGNOSIS — E038 Other specified hypothyroidism: Secondary | ICD-10-CM | POA: Diagnosis not present

## 2016-10-23 DIAGNOSIS — E1143 Type 2 diabetes mellitus with diabetic autonomic (poly)neuropathy: Secondary | ICD-10-CM | POA: Diagnosis present

## 2016-10-23 DIAGNOSIS — R945 Abnormal results of liver function studies: Secondary | ICD-10-CM

## 2016-10-23 DIAGNOSIS — E875 Hyperkalemia: Secondary | ICD-10-CM

## 2016-10-23 DIAGNOSIS — Z0000 Encounter for general adult medical examination without abnormal findings: Secondary | ICD-10-CM

## 2016-10-23 DIAGNOSIS — E784 Other hyperlipidemia: Secondary | ICD-10-CM | POA: Diagnosis not present

## 2016-10-23 DIAGNOSIS — Z23 Encounter for immunization: Secondary | ICD-10-CM | POA: Diagnosis not present

## 2016-10-23 LAB — CBC
HEMATOCRIT: 36 % — AB (ref 38.5–50.0)
HEMOGLOBIN: 11.1 g/dL — AB (ref 13.2–17.1)
MCH: 27.2 pg (ref 27.0–33.0)
MCHC: 30.8 g/dL — ABNORMAL LOW (ref 32.0–36.0)
MCV: 88.2 fL (ref 80.0–100.0)
Platelets: 132 10*3/uL — ABNORMAL LOW (ref 140–400)
RBC: 4.08 MIL/uL — AB (ref 4.20–5.80)
RDW: 16.6 % — ABNORMAL HIGH (ref 11.0–15.0)
WBC: 5.5 10*3/uL (ref 3.8–10.8)

## 2016-10-23 LAB — TSH: TSH: 6.21 m[IU]/L — AB (ref 0.40–4.50)

## 2016-10-23 LAB — COMPLETE METABOLIC PANEL WITH GFR
ALBUMIN: 3.6 g/dL (ref 3.6–5.1)
ALK PHOS: 70 U/L (ref 40–115)
ALT: 38 U/L (ref 9–46)
AST: 42 U/L — AB (ref 10–35)
BILIRUBIN TOTAL: 0.3 mg/dL (ref 0.2–1.2)
BUN: 67 mg/dL — AB (ref 7–25)
CALCIUM: 8.6 mg/dL (ref 8.6–10.3)
CO2: 26 mmol/L (ref 20–31)
CREATININE: 1.96 mg/dL — AB (ref 0.70–1.11)
Chloride: 105 mmol/L (ref 98–110)
GFR, Est African American: 35 mL/min — ABNORMAL LOW (ref >=60)
GFR, Est Non African American: 30 mL/min — ABNORMAL LOW (ref >=60)
Glucose, Bld: 256 mg/dL — ABNORMAL HIGH (ref 65–99)
POTASSIUM: 3.9 mmol/L (ref 3.5–5.3)
Sodium: 141 mmol/L (ref 135–146)
Total Protein: 6.6 g/dL (ref 6.1–8.1)

## 2016-10-23 LAB — LIPID PANEL
CHOL/HDL RATIO: 2.2 ratio (ref <=5.0)
CHOLESTEROL: 101 mg/dL — AB (ref 125–200)
HDL: 46 mg/dL (ref >=40)
LDL Cholesterol: 36 mg/dL (ref <130)
Triglycerides: 97 mg/dL (ref <150)
VLDL: 19 mg/dL (ref <30)

## 2016-10-23 LAB — POCT GLYCOSYLATED HEMOGLOBIN (HGB A1C): HEMOGLOBIN A1C: 7.2

## 2016-10-23 LAB — T4, FREE: Free T4: 0.8 ng/dL (ref 0.8–1.8)

## 2016-10-23 LAB — POCT INR: INR: 2.5

## 2016-10-23 NOTE — Progress Notes (Signed)
[facility] Phone: [phone]  Subjective:  Type 2 DM: Checks blood sugar 1-2 times daily. Blood sugars have been running between 70 and 208 over the last 2 weeks. He is taking Lantus 4 units daily and NovoLog 3 units 3 times a day with meals. He denies any shakiness or sweatiness. Last A1c was 7.2%.  HTN: He has a home health nurse that comes out to the house and checks his blood pressures regularly. His blood pressures have been running "high" at home. He does not know the exact values. He denies any chest pain. He endorses occasional lower extremity edema. He denies orthostatic hypotension or dizziness. Currently taking Norvasc 10 mg daily, clonidine 0.2 mg patch, Lasix 40 mg daily, hydralazine 50 mg 3 times a day, metoprolol succinate 25 mg daily. No side effects to medications.  Hypothyroidism: Currently taking Synthroid 50 g daily. No side effects. Denies cold intolerance, dry skin, constipation.  Anemia of Chronic Kidney Disease: Taking ferrous sulfate 325 mg daily. Denies palpitations, shortness of breath, dizziness. Last hemoglobin was 9.6.  Hyperlipidemia: Currently taking pravastatin 40 mg daily. Denies chest pain. Denies side effects to statin.  Hyperkalemia: Has a history of hyperkalemia, thought to be related to prior ACE inhibitor use versus chronic kidney disease. Denies any palpitations.  Elevated LFTs: History of elevated LFTs after starting allopurinol. Denies yellowing of skin or right upper quadrant pain.  Chronic anticoagulation: Takes Coumadin for history of portal vein thrombosis. Has seen oncology and hematology in the past and was told that he needs to be on lifelong anticoagulation. He states he had a nosebleed this morning that stopped pretty rapidly on its own.  Health Care Maintenance: Needs flu shot and pneumococcal vaccine today.  ROS: See HPI for pertinent positives and negatives  Past Medical History- HTN, Barrett's  esophagus, T2DM, hypothyroidism, CKD III, anemia of chronic kidney disease, history of portal vein thrombosis  Family history reviewed for today's visit. No changes.  Social history- patient is a former smoker  Objective: Wt 133 lb (60.3 kg)   BMI 22.83 kg/m  Gen: NAD, alert, cooperative with exam HEENT: NCAT, EOMI, MMM Neck: FROM, supple CV: RRR, no murmur Resp: CTABL, no wheezes, normal work of breathing Msk: No edema, warm, normal tone, moves UE/LE spontaneously Neuro: Alert and oriented, no gross deficits Skin: No rashes, no lesions Psych: Appropriate behavior  Assessment/Plan: Type 2 DM: Well controlled. Last A1c was 7.2%. Goal A1c less than 8.0%. No low blood sugars. - Recheck A1c today. - Continue Lantus 4 units daily and NovoLog 3 units 3 times a day with meals - Continue checking blood sugar 1-2 times daily - Follow up in 3 months.  HTN: Well controlled. Patient states that his blood pressures have been running high at home when the home health nurse checks it, but he does not know what these values have been. BP 142/80 in clinic today. Goal SBP < 150.  - Continue Norvasc 10 mg daily, clonidine 0.2 mg patch, Lasix 40 mg daily, metoprolol succinate 25 mg daily, and hydralazine 50 mg 3 times a day - Patient cannot tolerate ACE inhibitor due to recurrent hyperkalemia - Continue checking blood pressure at home per home health nurse - Follow up in 3 months.  Hypothyroidism:  Taking Synthroid 50 g daily. No side effects. - Will recheck a TSH and T4 today, as these have not been checked for 2 years. - Continue Synthroid 50 mg daily  Anemia of Chronic Kidney Disease: Taking  ferrous sulfate 325 mg daily. No active bleeding except for a brief nosebleed this morning. Last Hgb 9.6. - Will check a CBC today - Continue ferrous sulfate 325 mg daily  Hyperlipidemia: Taking pravastatin 40 mg daily. No side effects. Has not had lipid panel checked since 2015. - Will recheck a  lipid panel today - Continue pravastatin 40 mg daily  Hyperkalemia: Patient has a history of hyperkalemia. - Will check a CMP today  Elevated LFTs: Patient has a history of elevated LFTs - Will check a CMP today - May need to consider decreasing Allopurinol dose or working up for other causes of elevated LFTs  Chronic Anticoagulation: INR has been stable. He had a nose bleed this morning. - Will check an INR  Health Care Maintenance: - Pt received Flu shot and pneumococcal vaccine today  Hyman Bible, MD PGY-2

## 2016-10-23 NOTE — Assessment & Plan Note (Signed)
Well controlled. Patient states that his blood pressures have been running high at home when the home health nurse checks it, but he does not know what these values have been. BP 142/80 in clinic today. Goal SBP < 150.  - Continue Norvasc 10 mg daily, clonidine 0.2 mg patch, Lasix 40 mg daily, metoprolol succinate 25 mg daily, and hydralazine 50 mg 3 times a day - Patient cannot tolerate ACE inhibitor due to recurrent hyperkalemia - Continue checking blood pressure at home per home health nurse - Follow up in 3 months.

## 2016-10-23 NOTE — Assessment & Plan Note (Signed)
Taking pravastatin 40 mg daily. No side effects. Has not had lipid panel checked since 2015. - Will recheck a lipid panel today - Continue pravastatin 40 mg daily

## 2016-10-23 NOTE — Assessment & Plan Note (Signed)
Taking ferrous sulfate 325 mg daily. No active bleeding except for a brief nosebleed this morning. Last Hgb 9.6. - Will check a CBC today - Continue ferrous sulfate 325 mg daily

## 2016-10-23 NOTE — Assessment & Plan Note (Signed)
INR has been stable. He had a nose bleed this morning. - Will check an INR

## 2016-10-23 NOTE — Telephone Encounter (Signed)
Ok to send

## 2016-10-23 NOTE — Assessment & Plan Note (Signed)
Taking Synthroid 50 g daily. No side effects. - Will recheck a TSH and T4 today, as these have not been checked for 2 years. - Continue Synthroid 50 mg daily

## 2016-10-23 NOTE — Assessment & Plan Note (Signed)
-   Pt received Flu shot and pneumococcal vaccine today

## 2016-10-23 NOTE — Telephone Encounter (Signed)
Rn with [facility] is requesting todays lab CBC to be faxed to [facility]  [address] [place]

## 2016-10-23 NOTE — Assessment & Plan Note (Signed)
Patient has a history of elevated LFTs - Will check a CMP today - May need to consider decreasing Allopurinol dose or working up for other causes of elevated LFTs

## 2016-10-23 NOTE — Telephone Encounter (Signed)
Yes, okay to send CBC results to [facility] whenever we get the results back. Thanks!

## 2016-10-23 NOTE — Assessment & Plan Note (Signed)
Patient has a history of hyperkalemia. - Will check a CMP today

## 2016-10-23 NOTE — Assessment & Plan Note (Signed)
Well controlled. Last A1c was 7.2%. Goal A1c less than 8.0%. No low blood sugars. - Recheck A1c today. - Continue Lantus 4 units daily and NovoLog 3 units 3 times a day with meals - Continue checking blood sugar 1-2 times daily - Follow up in 3 months.

## 2016-10-23 NOTE — Patient Instructions (Addendum)
It was so nice to meet you!  We have checked a lot of labs today- I will send you a letter in the mail to let you know the results.  We will see you back in 3 months for a follow-up visit.  -Dr. Brett Albino

## 2016-10-24 NOTE — Telephone Encounter (Signed)
Lab printed and faxed to [facility]. Jazmin Hartsell,CMA

## 2016-10-25 ENCOUNTER — Encounter: Payer: Self-pay | Admitting: Internal Medicine

## 2016-10-28 ENCOUNTER — Telehealth: Payer: Self-pay | Admitting: Internal Medicine

## 2016-11-04 DIAGNOSIS — H401124 Primary open-angle glaucoma, left eye, indeterminate stage: Secondary | ICD-10-CM | POA: Diagnosis not present

## 2016-11-04 DIAGNOSIS — H401114 Primary open-angle glaucoma, right eye, indeterminate stage: Secondary | ICD-10-CM | POA: Diagnosis not present

## 2016-11-04 DIAGNOSIS — N183 Chronic kidney disease, stage 3 (moderate): Secondary | ICD-10-CM | POA: Diagnosis not present

## 2016-11-05 ENCOUNTER — Other Ambulatory Visit: Payer: Self-pay | Admitting: Family Medicine

## 2016-11-06 ENCOUNTER — Other Ambulatory Visit: Payer: Self-pay | Admitting: Family Medicine

## 2016-11-06 ENCOUNTER — Other Ambulatory Visit: Payer: Self-pay | Admitting: *Deleted

## 2016-11-06 DIAGNOSIS — Z5181 Encounter for therapeutic drug level monitoring: Secondary | ICD-10-CM | POA: Diagnosis not present

## 2016-11-06 DIAGNOSIS — I808 Phlebitis and thrombophlebitis of other sites: Secondary | ICD-10-CM | POA: Diagnosis not present

## 2016-11-06 DIAGNOSIS — E119 Type 2 diabetes mellitus without complications: Secondary | ICD-10-CM | POA: Diagnosis not present

## 2016-11-06 DIAGNOSIS — M329 Systemic lupus erythematosus, unspecified: Secondary | ICD-10-CM | POA: Diagnosis not present

## 2016-11-06 DIAGNOSIS — N183 Chronic kidney disease, stage 3 (moderate): Secondary | ICD-10-CM | POA: Diagnosis not present

## 2016-11-06 DIAGNOSIS — I129 Hypertensive chronic kidney disease with stage 1 through stage 4 chronic kidney disease, or unspecified chronic kidney disease: Secondary | ICD-10-CM | POA: Diagnosis not present

## 2016-11-06 DIAGNOSIS — H913 Deaf nonspeaking, not elsewhere classified: Secondary | ICD-10-CM | POA: Diagnosis not present

## 2016-11-06 DIAGNOSIS — Z7901 Long term (current) use of anticoagulants: Secondary | ICD-10-CM | POA: Diagnosis not present

## 2016-11-06 LAB — POCT INR: INR: 1.8

## 2016-11-12 ENCOUNTER — Ambulatory Visit (INDEPENDENT_AMBULATORY_CARE_PROVIDER_SITE_OTHER): Payer: Medicare Other | Admitting: Podiatry

## 2016-11-12 ENCOUNTER — Encounter: Payer: Self-pay | Admitting: Podiatry

## 2016-11-12 VITALS — Ht 64.0 in | Wt 133.0 lb

## 2016-11-12 DIAGNOSIS — L84 Corns and callosities: Secondary | ICD-10-CM

## 2016-11-12 DIAGNOSIS — M216X9 Other acquired deformities of unspecified foot: Secondary | ICD-10-CM

## 2016-11-12 DIAGNOSIS — D631 Anemia in chronic kidney disease: Secondary | ICD-10-CM | POA: Diagnosis not present

## 2016-11-12 DIAGNOSIS — Q828 Other specified congenital malformations of skin: Secondary | ICD-10-CM

## 2016-11-12 DIAGNOSIS — B351 Tinea unguium: Secondary | ICD-10-CM | POA: Diagnosis not present

## 2016-11-12 DIAGNOSIS — M79676 Pain in unspecified toe(s): Secondary | ICD-10-CM

## 2016-11-12 DIAGNOSIS — E1140 Type 2 diabetes mellitus with diabetic neuropathy, unspecified: Secondary | ICD-10-CM

## 2016-11-12 DIAGNOSIS — I129 Hypertensive chronic kidney disease with stage 1 through stage 4 chronic kidney disease, or unspecified chronic kidney disease: Secondary | ICD-10-CM | POA: Diagnosis not present

## 2016-11-12 DIAGNOSIS — N2581 Secondary hyperparathyroidism of renal origin: Secondary | ICD-10-CM | POA: Diagnosis not present

## 2016-11-12 DIAGNOSIS — Z794 Long term (current) use of insulin: Secondary | ICD-10-CM

## 2016-11-12 DIAGNOSIS — N183 Chronic kidney disease, stage 3 (moderate): Secondary | ICD-10-CM | POA: Diagnosis not present

## 2016-11-12 NOTE — Progress Notes (Signed)
Patient ID: Philip Matthews, male   DOB: May 24, 1928, 82 y.o.   MRN: [id number] Complaint:  Visit Type: Patient returns to my office for continued preventative foot care services. Complaint: Patient states" my nails have grown long and thick and become painful to walk and wear shoes". The patient presents for preventative foot care services. No changes to ROS.  Patient is diabetic with peripheral neuropathy. Patient is experiencing pain sub 5th metatarsal  B/L  Podiatric Exam: Vascular: dorsalis pedis and posterior tibial pulses are palpable bilateral. Capillary return is immediate. Temperature gradient is WNL. Skin turgor WNL  Sensorium: Normal Semmes Weinstein monofilament test. Normal tactile sensation bilaterally. Nail Exam: Pt has thick disfigured discolored nails with subungual debris noted bilateral entire nail hallux through fifth toenails Ulcer Exam: There is no evidence of ulcer or pre-ulcerative changes or infection. Orthopedic Exam: Muscle tone and strength are WNL. No limitations in general ROM. No crepitus or effusions noted. Foot type and digits show no abnormalities. Bony prominences are unremarkable. Skin: No infection or ulcers noted. Porokeratosis sub 5th metatarsal B/L  Diagnosis:  Onychomycosis, , Pain in right toe, pain in left toes, porokeratosis  Treatment & Plan Procedures and Treatment: Consent by patient was obtained for treatment procedures. The patient understood the discussion of treatment and procedures well. All questions were answered thoroughly reviewed. Debridement of mycotic and hypertrophic toenails, 1 through 5 bilateral and clearing of subungual debris. No ulceration, no infection noted.  _Padding was added to diabetic insoles in diabetic shoes. Return Visit-Office Procedure: Patient instructed to return to the office for a follow up visit 3 months for continued evaluation and treatment.   Gardiner Barefoot DPM

## 2016-11-13 ENCOUNTER — Other Ambulatory Visit: Payer: Self-pay | Admitting: *Deleted

## 2016-11-13 LAB — POCT INR: INR: 2

## 2016-11-14 ENCOUNTER — Telehealth: Payer: Self-pay | Admitting: Internal Medicine

## 2016-11-14 NOTE — Telephone Encounter (Signed)
Will forward to MD to give verbal ok for orders. Piercen Covino,CMA

## 2016-11-14 NOTE — Telephone Encounter (Signed)
Olivia Mackie would like verbal orders to continue care for another 60 days. Please advise. Thanks! ep

## 2016-11-14 NOTE — Telephone Encounter (Signed)
LM for Philip Matthews from [facility] to call back for verbal orders.

## 2016-11-14 NOTE — Telephone Encounter (Signed)
Okay to continue care for another 60 days. Thank you!

## 2016-11-14 NOTE — Telephone Encounter (Signed)
Philip Matthews was advised. ep

## 2016-11-18 ENCOUNTER — Encounter: Payer: Self-pay | Admitting: Internal Medicine

## 2016-11-19 ENCOUNTER — Other Ambulatory Visit: Payer: Self-pay | Admitting: *Deleted

## 2016-11-19 DIAGNOSIS — N183 Chronic kidney disease, stage 3 (moderate): Secondary | ICD-10-CM | POA: Diagnosis not present

## 2016-11-20 LAB — POCT INR: INR: 3.8

## 2016-11-27 ENCOUNTER — Other Ambulatory Visit: Payer: Self-pay | Admitting: *Deleted

## 2016-11-27 LAB — POCT INR: INR: 1.5

## 2016-12-03 ENCOUNTER — Other Ambulatory Visit: Payer: Self-pay | Admitting: Family Medicine

## 2016-12-04 ENCOUNTER — Encounter (INDEPENDENT_AMBULATORY_CARE_PROVIDER_SITE_OTHER): Payer: Medicare Other | Admitting: *Deleted

## 2016-12-04 ENCOUNTER — Other Ambulatory Visit: Payer: Self-pay | Admitting: *Deleted

## 2016-12-04 DIAGNOSIS — M329 Systemic lupus erythematosus, unspecified: Secondary | ICD-10-CM | POA: Diagnosis not present

## 2016-12-04 DIAGNOSIS — I808 Phlebitis and thrombophlebitis of other sites: Secondary | ICD-10-CM | POA: Diagnosis not present

## 2016-12-04 DIAGNOSIS — Z7901 Long term (current) use of anticoagulants: Secondary | ICD-10-CM | POA: Diagnosis not present

## 2016-12-04 DIAGNOSIS — Z5181 Encounter for therapeutic drug level monitoring: Secondary | ICD-10-CM | POA: Diagnosis not present

## 2016-12-04 DIAGNOSIS — E119 Type 2 diabetes mellitus without complications: Secondary | ICD-10-CM | POA: Diagnosis not present

## 2016-12-04 DIAGNOSIS — N183 Chronic kidney disease, stage 3 (moderate): Secondary | ICD-10-CM | POA: Diagnosis not present

## 2016-12-04 DIAGNOSIS — H913 Deaf nonspeaking, not elsewhere classified: Secondary | ICD-10-CM | POA: Diagnosis not present

## 2016-12-04 DIAGNOSIS — I129 Hypertensive chronic kidney disease with stage 1 through stage 4 chronic kidney disease, or unspecified chronic kidney disease: Secondary | ICD-10-CM | POA: Diagnosis not present

## 2016-12-04 LAB — POCT INR
INR: 1.5
INR: 1.5

## 2016-12-04 NOTE — Progress Notes (Signed)
This encounter was created in error - please disregard.

## 2016-12-11 ENCOUNTER — Other Ambulatory Visit: Payer: Self-pay | Admitting: *Deleted

## 2016-12-11 LAB — POCT INR: INR: 2.6

## 2016-12-18 ENCOUNTER — Other Ambulatory Visit: Payer: Self-pay | Admitting: *Deleted

## 2016-12-18 DIAGNOSIS — N183 Chronic kidney disease, stage 3 (moderate): Secondary | ICD-10-CM | POA: Diagnosis not present

## 2016-12-18 LAB — POCT INR: INR: 2.7

## 2016-12-27 ENCOUNTER — Encounter: Payer: Self-pay | Admitting: Internal Medicine

## 2017-01-01 ENCOUNTER — Other Ambulatory Visit: Payer: Self-pay | Admitting: *Deleted

## 2017-01-01 DIAGNOSIS — I808 Phlebitis and thrombophlebitis of other sites: Secondary | ICD-10-CM | POA: Diagnosis not present

## 2017-01-01 DIAGNOSIS — M329 Systemic lupus erythematosus, unspecified: Secondary | ICD-10-CM | POA: Diagnosis not present

## 2017-01-01 DIAGNOSIS — N183 Chronic kidney disease, stage 3 (moderate): Secondary | ICD-10-CM | POA: Diagnosis not present

## 2017-01-01 DIAGNOSIS — H913 Deaf nonspeaking, not elsewhere classified: Secondary | ICD-10-CM | POA: Diagnosis not present

## 2017-01-01 DIAGNOSIS — Z791 Long term (current) use of non-steroidal anti-inflammatories (NSAID): Secondary | ICD-10-CM | POA: Diagnosis not present

## 2017-01-01 DIAGNOSIS — E119 Type 2 diabetes mellitus without complications: Secondary | ICD-10-CM | POA: Diagnosis not present

## 2017-01-01 DIAGNOSIS — I129 Hypertensive chronic kidney disease with stage 1 through stage 4 chronic kidney disease, or unspecified chronic kidney disease: Secondary | ICD-10-CM | POA: Diagnosis not present

## 2017-01-01 LAB — POCT INR: INR: 4.2

## 2017-01-08 ENCOUNTER — Telehealth: Payer: Self-pay | Admitting: Internal Medicine

## 2017-01-08 NOTE — Telephone Encounter (Signed)
Philip Matthews from [facility] calling to report and INR on pt, says that he hasnt missed any doses, no bleeding, no changes in diet/medication etc. Pt has had some diarrhea over that past couple of days. Per dr Valentina Lucks, pt is to hold x2 days and then resume to the regular schedule. Philip Matthews, CMA

## 2017-01-09 ENCOUNTER — Telehealth: Payer: Self-pay | Admitting: Internal Medicine

## 2017-01-09 NOTE — Telephone Encounter (Signed)
Olivia Mackie from [facility] called and is requesting to be re certified so that they can still visit the patient. They do his pill box, INR and other things for the patient. Please call her [phone] to give them verbal orders. jw

## 2017-01-09 NOTE — Telephone Encounter (Signed)
Okay to give verbal orders?  ?

## 2017-01-09 NOTE — Telephone Encounter (Signed)
LVM for Olivia Mackie to call me back.

## 2017-01-10 ENCOUNTER — Other Ambulatory Visit: Payer: Self-pay | Admitting: *Deleted

## 2017-01-10 LAB — POCT INR: INR: 3.3

## 2017-01-10 NOTE — Telephone Encounter (Signed)
Verbal orders given  

## 2017-01-15 DIAGNOSIS — N183 Chronic kidney disease, stage 3 (moderate): Secondary | ICD-10-CM | POA: Diagnosis not present

## 2017-01-17 ENCOUNTER — Telehealth: Payer: Self-pay | Admitting: *Deleted

## 2017-01-17 NOTE — Telephone Encounter (Signed)
Olivia Mackie nurse from [facility] calling in to report INR on pt. It was 3.1 when checked on Wednesday. No missed doses, bleeding, hospital visits or changes in medications. Due to [place] being closed for the last two days she called it in today. Per dr Nori Riis continue with 7.5 mg (monday, Wednesday, Friday); 5 mg other days, and for [facility] to recheck it on Wednesday 01/23/16. Clemence Stillings Kennon Holter, CMA

## 2017-01-20 ENCOUNTER — Ambulatory Visit (INDEPENDENT_AMBULATORY_CARE_PROVIDER_SITE_OTHER): Payer: Medicare Other | Admitting: Podiatry

## 2017-01-20 DIAGNOSIS — M79671 Pain in right foot: Secondary | ICD-10-CM | POA: Diagnosis not present

## 2017-01-20 DIAGNOSIS — L84 Corns and callosities: Secondary | ICD-10-CM | POA: Diagnosis not present

## 2017-01-20 DIAGNOSIS — L851 Acquired keratosis [keratoderma] palmaris et plantaris: Secondary | ICD-10-CM

## 2017-01-20 DIAGNOSIS — L603 Nail dystrophy: Secondary | ICD-10-CM | POA: Diagnosis not present

## 2017-01-20 DIAGNOSIS — B351 Tinea unguium: Secondary | ICD-10-CM

## 2017-01-20 DIAGNOSIS — E0843 Diabetes mellitus due to underlying condition with diabetic autonomic (poly)neuropathy: Secondary | ICD-10-CM

## 2017-01-20 DIAGNOSIS — M79672 Pain in left foot: Secondary | ICD-10-CM

## 2017-01-20 DIAGNOSIS — L608 Other nail disorders: Secondary | ICD-10-CM

## 2017-01-20 DIAGNOSIS — M79609 Pain in unspecified limb: Secondary | ICD-10-CM | POA: Diagnosis not present

## 2017-01-22 ENCOUNTER — Encounter: Payer: Self-pay | Admitting: Family Medicine

## 2017-01-22 ENCOUNTER — Other Ambulatory Visit: Payer: Self-pay | Admitting: *Deleted

## 2017-01-22 LAB — POCT INR: INR: 7

## 2017-01-22 NOTE — Progress Notes (Signed)
Called by home health with INR of 7.0. No evidence of bleeding. I reviewed chart, discussed his situation with [facility] nurse. Patient feels fine. Home health nurse reports that he is at home with several family members who could transport him to hospital if needed.  Decided to leave him at home, stop warfarin, recheck INR daily with call back to here. Reiterated red flags such as chest pain, dizziness, any sign of bleeding--he will need to go the emergency room department. She explained this to the patient while we're on the phone. They will call tomorrow with repeat INR he has not been on any antibiotics she's had no recent change in diet is not taking any new over-the-counter medications by report. Dorcas Mcmurray

## 2017-01-23 ENCOUNTER — Other Ambulatory Visit: Payer: Self-pay | Admitting: *Deleted

## 2017-01-23 LAB — POCT INR: INR: 4.3

## 2017-01-24 ENCOUNTER — Other Ambulatory Visit: Payer: Self-pay | Admitting: *Deleted

## 2017-01-24 LAB — POCT INR: INR: 2.3

## 2017-01-26 NOTE — Progress Notes (Signed)
   SUBJECTIVE Patient with a history of diabetes mellitus presents to office today complaining of elongated, thickened nails. Pain while ambulating in shoes. Patient is unable to trim their own nails.   OBJECTIVE General Patient is awake, alert, and oriented x 3 and in no acute distress. Derm hyperkeratotic callus lesions also noted to the bilateral feet 5. Skin is dry and supple bilateral. Negative open lesions or macerations. Remaining integument unremarkable. Nails are tender, long, thickened and dystrophic with subungual debris, consistent with onychomycosis, 1-5 bilateral. No signs of infection noted. Vasc  DP and PT pedal pulses palpable bilaterally. Temperature gradient within normal limits.  Neuro Epicritic and protective threshold sensation diminished bilaterally.  Musculoskeletal Exam No symptomatic pedal deformities noted bilateral. Muscular strength within normal limits.  ASSESSMENT 1. Diabetes Mellitus w/ peripheral neuropathy 2. Onychomycosis of nail due to dermatophyte bilateral 3. Pain in foot bilateral 4. Painful callus lesions bilateral feet 5  PLAN OF CARE 1. Patient evaluated today. 2. Instructed to maintain good pedal hygiene and foot care. Stressed importance of controlling blood sugar.  3. Mechanical debridement of nails 1-5 bilaterally performed using a nail nipper. Filed with dremel without incident.  4. Excisional debridement of the painful callus lesions was performed using a chisel blade without incident or bleeding. 5. Return to clinic in 3 mos.     Edrick Kins, DPM [facility]  Dr. Edrick Kins, [address]                                        [place], [place] [address]                Office [phone]  Fax [phone]

## 2017-01-27 DIAGNOSIS — H401121 Primary open-angle glaucoma, left eye, mild stage: Secondary | ICD-10-CM | POA: Diagnosis not present

## 2017-01-27 DIAGNOSIS — H401113 Primary open-angle glaucoma, right eye, severe stage: Secondary | ICD-10-CM | POA: Diagnosis not present

## 2017-01-29 ENCOUNTER — Other Ambulatory Visit: Payer: Self-pay | Admitting: Family Medicine

## 2017-01-29 ENCOUNTER — Other Ambulatory Visit: Payer: Self-pay | Admitting: Internal Medicine

## 2017-01-29 DIAGNOSIS — I129 Hypertensive chronic kidney disease with stage 1 through stage 4 chronic kidney disease, or unspecified chronic kidney disease: Secondary | ICD-10-CM | POA: Diagnosis not present

## 2017-01-29 DIAGNOSIS — E1143 Type 2 diabetes mellitus with diabetic autonomic (poly)neuropathy: Secondary | ICD-10-CM

## 2017-01-29 DIAGNOSIS — M329 Systemic lupus erythematosus, unspecified: Secondary | ICD-10-CM | POA: Diagnosis not present

## 2017-01-29 DIAGNOSIS — M109 Gout, unspecified: Secondary | ICD-10-CM

## 2017-01-29 DIAGNOSIS — Z791 Long term (current) use of non-steroidal anti-inflammatories (NSAID): Secondary | ICD-10-CM | POA: Diagnosis not present

## 2017-01-29 DIAGNOSIS — H913 Deaf nonspeaking, not elsewhere classified: Secondary | ICD-10-CM | POA: Diagnosis not present

## 2017-01-29 DIAGNOSIS — I808 Phlebitis and thrombophlebitis of other sites: Secondary | ICD-10-CM | POA: Diagnosis not present

## 2017-01-29 DIAGNOSIS — N183 Chronic kidney disease, stage 3 (moderate): Secondary | ICD-10-CM | POA: Diagnosis not present

## 2017-01-29 DIAGNOSIS — E119 Type 2 diabetes mellitus without complications: Secondary | ICD-10-CM | POA: Diagnosis not present

## 2017-01-29 NOTE — Telephone Encounter (Signed)
Philip Matthews from [facility] called to report an INR on pt, it was 3.5. Pt hasnt missed any doses, no bleeding, changes in medications, or any trips to the ED. Pt is to hold medication for the next 2 days, then resume 7.5mg  (tuesday and Thursday); and 5mg  the other days. Pt will need to be rechecked in a week per dr Nori Riis. Delray Alt, CMA

## 2017-02-05 ENCOUNTER — Other Ambulatory Visit: Payer: Self-pay | Admitting: *Deleted

## 2017-02-05 LAB — POCT INR: INR: 5.1

## 2017-02-10 ENCOUNTER — Telehealth: Payer: Self-pay | Admitting: *Deleted

## 2017-02-10 ENCOUNTER — Encounter: Payer: Self-pay | Admitting: Internal Medicine

## 2017-02-10 NOTE — Telephone Encounter (Signed)
Olivia Mackie nurse from [facility] called to report an INR on pt, it was 1.5. Pt hasn't missed any doses, no bleeding, no changes in medication, and no hospital visits. Per dr Wendy Poet pt is to take 5mg  daily and recheck in a week. Deseree Kennon Holter, CMA

## 2017-02-11 ENCOUNTER — Ambulatory Visit: Payer: Medicare Other | Admitting: Sports Medicine

## 2017-02-12 ENCOUNTER — Telehealth: Payer: Self-pay | Admitting: Internal Medicine

## 2017-02-12 DIAGNOSIS — N183 Chronic kidney disease, stage 3 (moderate): Secondary | ICD-10-CM | POA: Diagnosis not present

## 2017-02-12 NOTE — Telephone Encounter (Signed)
[facility]: Philip Matthews fell yesterday. He fell on his arm and side. There were some bruises. He didn't hit his head or hurt his hip.

## 2017-02-12 NOTE — Telephone Encounter (Signed)
Will forward to MD to make aware. Kashira Behunin,CMA  

## 2017-02-12 NOTE — Telephone Encounter (Signed)
Philip Matthews is aware of this. Ricarda Atayde,CMA

## 2017-02-12 NOTE — Telephone Encounter (Signed)
Thanks for letting us know. If he has significant pain, he should be seen in our clinic for an appointment.

## 2017-02-17 ENCOUNTER — Telehealth: Payer: Self-pay | Admitting: *Deleted

## 2017-02-17 NOTE — Telephone Encounter (Signed)
[facility] nurse calling in to report pt INR, which was 3.5. Pt hasn't missed any doses, no changes in diet, no bleeding or hospital visits. Per dr Mingo Amber, hold x1 day, and then resume regular schedule which is 5mg  daily. Nurse will be back to check it on Wednesday. Yanique Mulvihill Kennon Holter, CMA

## 2017-02-26 ENCOUNTER — Telehealth: Payer: Self-pay | Admitting: *Deleted

## 2017-02-26 DIAGNOSIS — H913 Deaf nonspeaking, not elsewhere classified: Secondary | ICD-10-CM | POA: Diagnosis not present

## 2017-02-26 DIAGNOSIS — I129 Hypertensive chronic kidney disease with stage 1 through stage 4 chronic kidney disease, or unspecified chronic kidney disease: Secondary | ICD-10-CM | POA: Diagnosis not present

## 2017-02-26 DIAGNOSIS — Z7901 Long term (current) use of anticoagulants: Secondary | ICD-10-CM | POA: Diagnosis not present

## 2017-02-26 DIAGNOSIS — M329 Systemic lupus erythematosus, unspecified: Secondary | ICD-10-CM | POA: Diagnosis not present

## 2017-02-26 DIAGNOSIS — E119 Type 2 diabetes mellitus without complications: Secondary | ICD-10-CM | POA: Diagnosis not present

## 2017-02-26 DIAGNOSIS — N183 Chronic kidney disease, stage 3 (moderate): Secondary | ICD-10-CM | POA: Diagnosis not present

## 2017-02-26 DIAGNOSIS — I808 Phlebitis and thrombophlebitis of other sites: Secondary | ICD-10-CM | POA: Diagnosis not present

## 2017-02-26 DIAGNOSIS — Z5181 Encounter for therapeutic drug level monitoring: Secondary | ICD-10-CM | POA: Diagnosis not present

## 2017-02-26 NOTE — Telephone Encounter (Signed)
Olivia Mackie from [facility] calling in to report pt's INR, which is 5.8. She reported no missed doses, changes in diet, bleeding, and/or visits to ED. Per dr Nori Riis pt needs to hold dose for today and needs to be rechecked tomorrow. Deseree Kennon Holter, CMA

## 2017-02-27 ENCOUNTER — Other Ambulatory Visit: Payer: Self-pay | Admitting: *Deleted

## 2017-02-27 LAB — POCT INR: INR: 4

## 2017-02-28 ENCOUNTER — Other Ambulatory Visit: Payer: Self-pay | Admitting: *Deleted

## 2017-02-28 LAB — POCT INR: INR: 2.3

## 2017-03-05 ENCOUNTER — Other Ambulatory Visit: Payer: Self-pay | Admitting: *Deleted

## 2017-03-05 LAB — POCT INR: INR: 3.5

## 2017-03-12 ENCOUNTER — Telehealth: Payer: Self-pay | Admitting: *Deleted

## 2017-03-12 NOTE — Telephone Encounter (Signed)
[facility] nurse tracy called in to report pt INR, which was 4.0. Pt hasn't had any bleeding, missed doses, changes in diet and/or visits to the hospital. Per dr Nori Riis, pt is to hold x2 days and then resume regular schedule 2.5mg  (Wednesday and Saturday) and 5mg  (other days). They will need to recheck in 7 days. Deseree Kennon Holter, CMA

## 2017-03-13 ENCOUNTER — Telehealth: Payer: Self-pay | Admitting: Internal Medicine

## 2017-03-13 NOTE — Telephone Encounter (Signed)
Philip Matthews would like verbal orders to continue nurse visits every other week. ep

## 2017-03-13 NOTE — Telephone Encounter (Signed)
Okay to give verbal feedback

## 2017-03-13 NOTE — Telephone Encounter (Signed)
Left message for [name] to return my call.

## 2017-03-14 NOTE — Telephone Encounter (Signed)
Philip Matthews is aware of these verbal orders.  Jazmin Hartsell,CMA

## 2017-03-18 ENCOUNTER — Ambulatory Visit: Payer: Medicare Other | Admitting: Internal Medicine

## 2017-03-19 ENCOUNTER — Ambulatory Visit (INDEPENDENT_AMBULATORY_CARE_PROVIDER_SITE_OTHER): Payer: Medicare Other | Admitting: Student

## 2017-03-19 ENCOUNTER — Encounter: Payer: Self-pay | Admitting: Internal Medicine

## 2017-03-19 ENCOUNTER — Encounter: Payer: Self-pay | Admitting: Student

## 2017-03-19 VITALS — BP 100/62 | HR 67 | Wt 128.0 lb

## 2017-03-19 DIAGNOSIS — R2242 Localized swelling, mass and lump, left lower limb: Secondary | ICD-10-CM | POA: Diagnosis present

## 2017-03-19 DIAGNOSIS — N183 Chronic kidney disease, stage 3 (moderate): Secondary | ICD-10-CM | POA: Diagnosis not present

## 2017-03-19 NOTE — Patient Instructions (Signed)
Follow up with PCP as needed Return if your leg becomes swollen,. painful or red A referral to dermatology was made for you If you have questions or concerns, call the office at [phone]

## 2017-03-19 NOTE — Progress Notes (Signed)
Tracey (nurse with [facility]) calls to report INR of 3.9. Patient had held coumadin for 2 days and after that took 2.5 mg (Wed & Sat) and 5 mg (other days), patient states no doses were missed. Spoke with Dr. Brett Albino, patient will hold for 2 days then take 2.5 mg (Mon, Wed, Sat) and 5 mg other days, will re-check in 1 week. Tracey informed.

## 2017-03-19 NOTE — Progress Notes (Signed)
PROCEDURE NOTE: Written consent obtained. Risks and benefits of the procedure were explained to the patient. Patient made an informed decision to proceed with the procedure. Betadine prep per usual protocol. Local anesthesia obtained with Adriana Reams. And 18 gauge needle was then inserted into the mass but no fluid was able to be drained. Then an approximately .5 cm incision made with 11 blade along lesion with no fluid expressed  The site was dressed. Wound care instructions including precautions with patient. Patient tolerated the procedure well.

## 2017-03-19 NOTE — Progress Notes (Signed)
   Subjective:    Patient ID: Philip Matthews, male    DOB: 05-08-1928, 83 y.o.   MRN: [id number]  Sign language interpreter present  CC: lump on keg  HPI: 83 y/o M presents for lump on leg  Lump on leg - noticed it one week ago - mildly painful to touch, not red - he denies rash overlying - he denies fevers - he has not had something like this before  Smoking status reviewed  Review of Systems  Per HPI, else denies chest pain, shortness of breath, abdominal pain,   Objective:  BP 100/62   Pulse 67   Wt 128 lb (58.1 kg)   SpO2 99%   BMI 21.97 kg/m  Vitals and nursing note reviewed  General: NAD Cardiac: RRR, Respiratory: CTAB, normal effort Abdomen: soft, nontender, nondistended,  Extremities: approximately 2 cm round, firm mildly fluctuant swelling on the lateral aspect of the left thigh, no overlying skin erythema or rash   Assessment & Plan:    No problem-specific Assessment & Plan notes found for this encounter.    Dawanna Grauberger A. Lincoln Brigham MD, [facility] Resident PGY-3 Pager [phone]

## 2017-03-19 NOTE — Assessment & Plan Note (Signed)
Lesion on leg of unclear origin. Differential includes subdermal cyst versus abscess versus solid mass like lipoma. Unable to drain any fluid with aspiration or scalpel incision (see procedure note) - will refer to dermatology - return precautions discussed

## 2017-03-26 ENCOUNTER — Telehealth: Payer: Self-pay | Admitting: *Deleted

## 2017-03-26 DIAGNOSIS — D649 Anemia, unspecified: Secondary | ICD-10-CM | POA: Diagnosis not present

## 2017-03-26 DIAGNOSIS — I129 Hypertensive chronic kidney disease with stage 1 through stage 4 chronic kidney disease, or unspecified chronic kidney disease: Secondary | ICD-10-CM | POA: Diagnosis not present

## 2017-03-26 DIAGNOSIS — Z5181 Encounter for therapeutic drug level monitoring: Secondary | ICD-10-CM | POA: Diagnosis not present

## 2017-03-26 DIAGNOSIS — M329 Systemic lupus erythematosus, unspecified: Secondary | ICD-10-CM | POA: Diagnosis not present

## 2017-03-26 NOTE — Telephone Encounter (Signed)
Philip Matthews from [facility] called to report pt INR, which was 5.1. Pt reported no missed doses, bleeding, changes in meds, or tips to the ED. Per dr Andria Frames, pt is to hold x 2 days then 5mg - Mon/Fri, 2.5 mg-other days. Please see note from dr Andria Frames. Philip Matthews, CMA

## 2017-03-27 ENCOUNTER — Encounter: Payer: Self-pay | Admitting: Student

## 2017-03-27 ENCOUNTER — Ambulatory Visit (INDEPENDENT_AMBULATORY_CARE_PROVIDER_SITE_OTHER): Payer: Medicare Other | Admitting: Student

## 2017-03-27 VITALS — BP 124/70 | HR 70 | Temp 94.6°F | Ht 64.0 in | Wt 128.0 lb

## 2017-03-27 DIAGNOSIS — R2242 Localized swelling, mass and lump, left lower limb: Secondary | ICD-10-CM | POA: Diagnosis not present

## 2017-03-27 DIAGNOSIS — T68XXXA Hypothermia, initial encounter: Secondary | ICD-10-CM

## 2017-03-27 DIAGNOSIS — L84 Corns and callosities: Secondary | ICD-10-CM

## 2017-03-27 DIAGNOSIS — E038 Other specified hypothyroidism: Secondary | ICD-10-CM

## 2017-03-27 MED ORDER — LEVOTHYROXINE SODIUM 75 MCG PO TABS: 75.0000 ug | ORAL_TABLET | Freq: Every day | ORAL | 3 refills | Status: DC

## 2017-03-27 NOTE — Assessment & Plan Note (Signed)
Foot calluses typically managed by podiatry - will encourage foot soaks ad wearing non irrittating shoes. He is already wears supportive shoes which is helpful

## 2017-03-27 NOTE — Progress Notes (Signed)
   Subjective:    Patient ID: Philip Matthews, male    DOB: 04-Apr-1928, 83 y.o.   MRN: [id number]  Sign language interpreter used  CC: low temp to 93  HPI: 83 y/o male presents to follow up low temp  Low oral temp to 93 - home health nurse took routine vitals and found was temp was 93 - he has been in his usual state of health - no chills or other infectious symptoms  Hypothyroid - he has labs taken by his home health nurse which were significant for TSH of 9.8  Lesion on leg - it still feels sore but overall not bothersome - unchanged in character - has a dermatology appointment on 04/15/2017  Calluses on left foot - has two calluses on left foot - she does see a foot specialist every 3 months who manages these but he wonder about further advice  Smoking status reviewed  Review of Systems  Per HPI, else denies chest pain, shortness of breath,   Objective:  BP 124/70   Pulse 70   Temp (!) 94.6 F (34.8 C) (Oral)   Ht 5\' 4"  (1.626 m)   Wt 128 lb (58.1 kg)   SpO2 98%   BMI 21.97 kg/m  Vitals and nursing note reviewed  General: NAD Cardiac: RRR, Respiratory: CTAB, normal effort Extremities: no edema or cyanosis. WWP. Two calluses on left foot,  at the 5th metatarsal and one at the first metatarsal, no evidence of skin breakdown Skin:  Lesion on left leg with some bruising surrounding it, but overall unchanged in quality, no warmth mildly tender to palpation, no erythema Neuro: alert and oriented, no focal deficits   Assessment & Plan:    Hypothyroid TSH 9.8 at home health nurse check on 3/28 - will increase synthroid from 50 to 75 will need to recheck in 3 months  Skin lump of leg, left Stable, will follow with dermatology for evaluation  Hypothermia Hypothermia mildy improving to 94.6 from 93 yesterday.No evidence of sepsis. I do suspect his hypothyroidism may be contributing.  - will increase synthroid to 75 from 50 - he has an appointment on 4/2 with  his PCP, at that time will follow temp  Foot callus Foot calluses typically managed by podiatry - will encourage foot soaks ad wearing non irrittating shoes. He is already wears supportive shoes which is helpful    Philip Matthews A. Lincoln Brigham MD, [facility] Resident PGY-3 Pager [phone]

## 2017-03-27 NOTE — Assessment & Plan Note (Addendum)
TSH 9.8 at home health nurse check on 3/28 - will increase synthroid from 50 to 75 will need to recheck in 3 months

## 2017-03-27 NOTE — Patient Instructions (Signed)
Follow up as scheduled with your PCP If you have any questions or concerns, call the office at [phone]

## 2017-03-27 NOTE — Assessment & Plan Note (Signed)
Hypothermia mildy improving to 94.6 from 93 yesterday.No evidence of sepsis. I do suspect his hypothyroidism may be contributing.  - will increase synthroid to 75 from 50 - he has an appointment on 4/2 with his PCP, at that time will follow temp

## 2017-03-27 NOTE — Assessment & Plan Note (Signed)
Stable, will follow with dermatology for evaluation

## 2017-03-31 ENCOUNTER — Ambulatory Visit (INDEPENDENT_AMBULATORY_CARE_PROVIDER_SITE_OTHER): Payer: Medicare Other | Admitting: Internal Medicine

## 2017-03-31 ENCOUNTER — Encounter: Payer: Self-pay | Admitting: Internal Medicine

## 2017-03-31 VITALS — BP 110/56 | HR 66 | Temp 97.5°F | Wt 126.0 lb

## 2017-03-31 DIAGNOSIS — E1143 Type 2 diabetes mellitus with diabetic autonomic (poly)neuropathy: Secondary | ICD-10-CM

## 2017-03-31 DIAGNOSIS — Z7901 Long term (current) use of anticoagulants: Secondary | ICD-10-CM

## 2017-03-31 DIAGNOSIS — E038 Other specified hypothyroidism: Secondary | ICD-10-CM | POA: Diagnosis not present

## 2017-03-31 LAB — POCT INR: INR: 1.9

## 2017-03-31 LAB — POCT GLYCOSYLATED HEMOGLOBIN (HGB A1C): HEMOGLOBIN A1C: 6

## 2017-03-31 MED ORDER — LEVOTHYROXINE SODIUM 75 MCG PO TABS: 75.0000 ug | ORAL_TABLET | Freq: Every day | ORAL | 0 refills | Status: DC

## 2017-03-31 MED ORDER — INSULIN LISPRO 100 UNIT/ML (KWIKPEN)
3.0000 [IU] | PEN_INJECTOR | Freq: Three times a day (TID) | SUBCUTANEOUS | 2 refills | Status: DC
Start: 2017-03-31 — End: 2018-03-13

## 2017-03-31 NOTE — Assessment & Plan Note (Signed)
Requires chronic anticoagulation for history of portal vein thrombosis. Takes Coumadin. INRs have been high recently in the 3-6 range. Has been holding Coumadin for the last 2 days. INR was 1.9 today. - Per chart review, patient's hematologist has suggested switching to Xarelto pending patient's kidney function. Last creatinine was 1.96. Creatinine clearance calculated to be 22, which is a contraindication to Xarelto use (needs to have CrCl >30). - Recheck BMP today. - Restart Coumadin. Take Coumadin 5 mg on Monday and Friday and 2.5 mg on all other days. - Home health nurse will recheck INR on Wednesday

## 2017-03-31 NOTE — Progress Notes (Signed)
   [facility] Phone: [phone]  Subjective:  Maki is an 83 year old male presenting to clinic for follow-up of his chronic medical conditions.  Chronic Anticoagulation: Needs lifelong anticoagulation for portal vein thrombosis. On Coumadin. Has seen Dr. Alen Blew (hematology) in the past. No hematochezia, no melena, no hematuria, no epistaxis.  T2DM: Checking CBG a few times per day. CBGs have mostly been running in the 120s-160s. Occasionally having low blood sugars to 62. Taking Lantus 4 units at breakfast. Also takes Humalog 6 units in the morning, 6 units at lunch, and 4 units at dinner. No dizziness, no falls, no shakiness, no sweatiness.  Hypothyroidism: Seen in our clinic on 3/29 for a low oral temperature to 93 that was found by the home health nurse. He had his TSH checked by the home health nurse, which was significant for a TSH of 9.8. At that visit, his Synthroid dose was increased from 50 g to 75 g daily. Has tolerated this dose change without any issues. No constipation. No dry skin.  ROS: See HPI for pertinent positives and negatives  Past Medical History- HTN, portal vein thrombosis (needs lifelong anticoagulation), T2DM, hypothyroidism, CKD III, HLD  Family history reviewed for today's visit. No changes.  Social history- patient is a former smoker  Objective: BP (!) 110/56   Pulse 66   Temp 97.5 F (36.4 C) (Oral)   Wt 126 lb (57.2 kg)   SpO2 99%   BMI 21.63 kg/m  Gen: NAD, alert, cooperative with exam, communicates using sign language HEENT: NCAT, EOMI, MMM Neck: FROM, supple, no thyromegaly CV: RRR, no murmur Resp: CTABL, no wheezes, normal work of breathing Msk: No edema, warm, normal tone, moves UE/LE spontaneously Neuro: Alert and oriented, no gross deficits Skin: No rashes, no lesions Psych: Appropriate behavior  Assessment/Plan: Chronic Anticoagulation: Requires chronic anticoagulation for history of portal vein  thrombosis. Takes Coumadin. INRs have been high recently in the 3-6 range. Has been holding Coumadin for the last 2 days. INR was 1.9 today. - Per chart review, patient's hematologist has suggested switching to Xarelto pending patient's kidney function. Last creatinine was 1.96. Creatinine clearance calculated to be 22, which is a contraindication to Xarelto use (needs to have CrCl >30). - Recheck BMP today. - Restart Coumadin. Take Coumadin 5 mg on Monday and Friday and 2.5 mg on all other days. - Home health nurse will recheck INR on Wednesday  T2DM: Well controlled. Last A1c was 7.2%. Repeat A1c today was 6.0%. CBGs have been ranging in the 120s to 160s, but has had some lows to 62. Taking Lantus 4 units with breakfast and Humalog 6 units with breakfast, 6 units with lunch, and 4 units with dinner.  - Think that his blood sugars are too tightly controlled. I worry about low blood sugars in a gentleman his age. - Continue Lantus 4 units with breakfast - Decrease Humalog to 3 units 3 times a day with meals - Continue checking blood sugars a few times per day - Follow-up in 3 months  Hypothyroidism: Recently had his TSH checked by his home health nurse, and it was 9.8. Previously taking Synthroid 50 g daily, but it was increased to 75 g daily on 3/29. - Continue Synthroid 75 g daily - Follow up in 3 months for recheck of TSH   Hyman Bible, MD PGY-2

## 2017-03-31 NOTE — Assessment & Plan Note (Signed)
Recently had his TSH checked by his home health nurse, and it was 9.8. Previously taking Synthroid 50 g daily, but it was increased to 75 g daily on 3/29. - Continue Synthroid 75 g daily - Follow up in 3 months for recheck of TSH

## 2017-03-31 NOTE — Assessment & Plan Note (Signed)
Well controlled. Last A1c was 7.2%. Repeat A1c today was 6.0%. CBGs have been ranging in the 120s to 160s, but has had some lows to 62. Taking Lantus 4 units with breakfast and Humalog 6 units with breakfast, 6 units with lunch, and 4 units with dinner.  - Think that his blood sugars are too tightly controlled. I worry about low blood sugars in a gentleman his age. - Continue Lantus 4 units with breakfast - Decrease Humalog to 3 units 3 times a day with meals - Continue checking blood sugars a few times per day - Follow-up in 3 months

## 2017-03-31 NOTE — Patient Instructions (Addendum)
It was so nice to see you!  I have sent in a new prescription for the Synthroid (thyroid medication). Please take 1 tablet daily. We will recheck his thyroid in 3 months.  Your A1c was 6.0%. I do not want you to have low blood sugars at home. Please continue to take the Lantus (grey pen) 4 units daily at breakfast. I would like you to decrease the Humalog (blue pen) to 3 units at breakfast, 3 units at lunch, and 3 units at dinner.  For the Coumadin- He should take 5mg  on Monday and Friday and then 2.5mg  on all the other days. The home health agency will come out this week to recheck his INR.  -Dr. Brett Albino

## 2017-04-01 ENCOUNTER — Encounter: Payer: Self-pay | Admitting: Internal Medicine

## 2017-04-01 LAB — BASIC METABOLIC PANEL WITH GFR
BUN/Creatinine Ratio: 50 — ABNORMAL HIGH (ref 10–24)
BUN: 105 mg/dL (ref 8–27)
CO2: 19 mmol/L (ref 18–29)
Calcium: 9.2 mg/dL (ref 8.6–10.2)
Chloride: 109 mmol/L — ABNORMAL HIGH (ref 96–106)
Creatinine, Ser: 2.1 mg/dL — ABNORMAL HIGH (ref 0.76–1.27)
GFR calc Af Amer: 32 mL/min/{1.73_m2} — ABNORMAL LOW
GFR calc non Af Amer: 28 mL/min/{1.73_m2} — ABNORMAL LOW
Glucose: 120 mg/dL — ABNORMAL HIGH (ref 65–99)
Potassium: 6.3 mmol/L — ABNORMAL HIGH (ref 3.5–5.2)
Sodium: 142 mmol/L (ref 134–144)

## 2017-04-02 ENCOUNTER — Telehealth: Payer: Self-pay | Admitting: *Deleted

## 2017-04-02 NOTE — Telephone Encounter (Signed)
Philip Matthews from [facility] called in to report INR which was 1.9. Pt reported no missed doses, bleeding, trips to hospital, or changes in medications. Per dr Nori Riis he is to continue 5mg -Mon and Fri, 2.5mg - other days. He is to follow up in a week. Deseree Kennon Holter, CMA

## 2017-04-07 ENCOUNTER — Encounter: Payer: Self-pay | Admitting: Student

## 2017-04-07 ENCOUNTER — Ambulatory Visit (INDEPENDENT_AMBULATORY_CARE_PROVIDER_SITE_OTHER): Payer: Medicare Other | Admitting: Student

## 2017-04-07 VITALS — BP 110/62 | HR 90 | Temp 98.0°F | Wt 127.0 lb

## 2017-04-07 DIAGNOSIS — M19012 Primary osteoarthritis, left shoulder: Secondary | ICD-10-CM | POA: Diagnosis present

## 2017-04-07 MED ORDER — METHYLPREDNISOLONE ACETATE 40 MG/ML IJ SUSP
40.0000 mg | Freq: Once | INTRAMUSCULAR | Status: AC
  Administered 2017-04-07: 40 mg via INTRAMUSCULAR

## 2017-04-07 NOTE — Assessment & Plan Note (Signed)
OA of left shoulder improved with steroid injections in past, desires repeat injection. Steroid injection today ( see procedure note) - patient to continue to follow with orthopedics

## 2017-04-07 NOTE — Patient Instructions (Signed)
Follow up with PCP as needed Call the office with questions or concerns Please follow with Dr Ann Held for your shoulder

## 2017-04-07 NOTE — Addendum Note (Signed)
Addended by: Dorna Bloom on: 04/07/2017 05:08 PM   Modules accepted: Orders

## 2017-04-07 NOTE — Progress Notes (Signed)
   Subjective:    Patient ID: Philip Matthews, male    DOB: [birth year], 83 y.o.   MRN: [id number]   CC: left shoulder pain  HPI: 83 y/o M with bilateral shoulder OA and history of right shoulder surgery presents for left shoulder pain   Left shoulder pain - has been chronic in nature previously managed by Dr Tamera Punt with orthopedic surgery - he was not able to see Dr Tamera Punt until 4/13 but is going out of town - his left shoulder has been slowly getting worse such that he has difficulty using it for things like getting dressed - no recent injuries - no weakness, no numbness or tingling - he has had injections of his shoulder in the remote past but none within the last 2 years  Smoking status reviewed  Review of Systems  Per HPI, else denies recent illness, fever, chest pain, shortness of breath,     Objective:  BP 110/62   Pulse 90   Temp 98 F (36.7 C) (Oral)   Wt 127 lb (57.6 kg)   SpO2 99%   BMI 21.80 kg/m  Vitals and nursing note reviewed  General: NAD Cardiac: RRR,  Respiratory: CTAB, normal effort Extremities: left shoulder tenderness to palpation, unable to lift left arm above 30 degrees, unable to reach behind his back, 4/5 bilerateral UE stength Skin: warm and dry, no rashes noted Neuro: alert and oriented   Assessment & Plan:    Osteoarthritis of left shoulder OA of left shoulder improved with steroid injections in past, desires repeat injection. Steroid injection today ( see procedure note) - patient to continue to follow with orthopedics     Philip Matthews A. Lincoln Brigham MD, [facility] Resident PGY-3 Pager [phone]

## 2017-04-07 NOTE — Progress Notes (Signed)
INJECTION: Patient was given informed consent, signed copy in the chart. Appropriate time out was taken. Area prepped and draped in usual sterile fashion. 1 cc of methylprednisolone 40 mg/ml plus  4 cc of 1% lidocaine without epinephrine was injected into the left shoulder using a(n) posterior approach. The patient tolerated the procedure well. There were no complications. Post procedure instructions were given.

## 2017-04-09 ENCOUNTER — Other Ambulatory Visit: Payer: Self-pay | Admitting: *Deleted

## 2017-04-09 DIAGNOSIS — N183 Chronic kidney disease, stage 3 (moderate): Secondary | ICD-10-CM | POA: Diagnosis not present

## 2017-04-09 LAB — POCT INR: INR: 1.1

## 2017-04-10 ENCOUNTER — Ambulatory Visit: Payer: Medicare Other | Admitting: Internal Medicine

## 2017-04-14 ENCOUNTER — Encounter: Payer: Self-pay | Admitting: Internal Medicine

## 2017-04-15 DIAGNOSIS — D489 Neoplasm of uncertain behavior, unspecified: Secondary | ICD-10-CM | POA: Insufficient documentation

## 2017-04-15 DIAGNOSIS — M793 Panniculitis, unspecified: Secondary | ICD-10-CM | POA: Diagnosis not present

## 2017-04-16 ENCOUNTER — Other Ambulatory Visit: Payer: Self-pay | Admitting: *Deleted

## 2017-04-16 LAB — POCT INR: INR: 1.3

## 2017-04-21 ENCOUNTER — Encounter: Payer: Self-pay | Admitting: Podiatry

## 2017-04-21 ENCOUNTER — Ambulatory Visit (INDEPENDENT_AMBULATORY_CARE_PROVIDER_SITE_OTHER): Payer: Medicare Other | Admitting: Podiatry

## 2017-04-21 ENCOUNTER — Other Ambulatory Visit: Payer: Self-pay | Admitting: Internal Medicine

## 2017-04-21 ENCOUNTER — Other Ambulatory Visit: Payer: Self-pay | Admitting: Family Medicine

## 2017-04-21 DIAGNOSIS — E0842 Diabetes mellitus due to underlying condition with diabetic polyneuropathy: Secondary | ICD-10-CM

## 2017-04-21 DIAGNOSIS — B351 Tinea unguium: Secondary | ICD-10-CM

## 2017-04-21 DIAGNOSIS — M79609 Pain in unspecified limb: Secondary | ICD-10-CM | POA: Diagnosis not present

## 2017-04-21 DIAGNOSIS — L603 Nail dystrophy: Secondary | ICD-10-CM | POA: Diagnosis not present

## 2017-04-21 DIAGNOSIS — M79676 Pain in unspecified toe(s): Secondary | ICD-10-CM

## 2017-04-21 DIAGNOSIS — M79671 Pain in right foot: Secondary | ICD-10-CM

## 2017-04-21 DIAGNOSIS — R600 Localized edema: Secondary | ICD-10-CM

## 2017-04-21 DIAGNOSIS — L608 Other nail disorders: Secondary | ICD-10-CM

## 2017-04-21 DIAGNOSIS — L84 Corns and callosities: Secondary | ICD-10-CM

## 2017-04-21 DIAGNOSIS — Q828 Other specified congenital malformations of skin: Secondary | ICD-10-CM

## 2017-04-21 DIAGNOSIS — I10 Essential (primary) hypertension: Secondary | ICD-10-CM

## 2017-04-21 DIAGNOSIS — L851 Acquired keratosis [keratoderma] palmaris et plantaris: Secondary | ICD-10-CM

## 2017-04-21 DIAGNOSIS — M79672 Pain in left foot: Secondary | ICD-10-CM

## 2017-04-23 ENCOUNTER — Other Ambulatory Visit: Payer: Self-pay | Admitting: *Deleted

## 2017-04-23 DIAGNOSIS — H913 Deaf nonspeaking, not elsewhere classified: Secondary | ICD-10-CM | POA: Diagnosis not present

## 2017-04-23 DIAGNOSIS — Z5181 Encounter for therapeutic drug level monitoring: Secondary | ICD-10-CM | POA: Diagnosis not present

## 2017-04-23 DIAGNOSIS — N183 Chronic kidney disease, stage 3 (moderate): Secondary | ICD-10-CM | POA: Diagnosis not present

## 2017-04-23 DIAGNOSIS — E119 Type 2 diabetes mellitus without complications: Secondary | ICD-10-CM | POA: Diagnosis not present

## 2017-04-23 DIAGNOSIS — I129 Hypertensive chronic kidney disease with stage 1 through stage 4 chronic kidney disease, or unspecified chronic kidney disease: Secondary | ICD-10-CM | POA: Diagnosis not present

## 2017-04-23 DIAGNOSIS — M329 Systemic lupus erythematosus, unspecified: Secondary | ICD-10-CM | POA: Diagnosis not present

## 2017-04-23 DIAGNOSIS — I808 Phlebitis and thrombophlebitis of other sites: Secondary | ICD-10-CM | POA: Diagnosis not present

## 2017-04-23 DIAGNOSIS — Z7901 Long term (current) use of anticoagulants: Secondary | ICD-10-CM | POA: Diagnosis not present

## 2017-04-23 LAB — POCT INR: INR: 2

## 2017-04-23 NOTE — Progress Notes (Signed)
   SUBJECTIVE Patient with a history of diabetes mellitus presents to office today complaining of elongated, thickened nails. Pain while ambulating in shoes. Patient is unable to trim their own nails.   OBJECTIVE General Patient is awake, alert, and oriented x 3 and in no acute distress. Derm hyperkeratotic callus lesions also noted to the bilateral feet 5. Skin is dry and supple bilateral. Negative open lesions or macerations. Remaining integument unremarkable. Nails are tender, long, thickened and dystrophic with subungual debris, consistent with onychomycosis, 1-5 bilateral. No signs of infection noted. Vasc  DP and PT pedal pulses palpable bilaterally. Temperature gradient within normal limits.  Neuro Epicritic and protective threshold sensation diminished bilaterally.  Musculoskeletal Exam No symptomatic pedal deformities noted bilateral. Muscular strength within normal limits.  ASSESSMENT 1. Diabetes Mellitus w/ peripheral neuropathy 2. Onychomycosis of nail due to dermatophyte bilateral 3. Pain in foot bilateral 4. Painful callus lesions bilateral feet 5 5. Bilateral lower extremity edema  PLAN OF CARE 1. Patient evaluated today. 2. Instructed to maintain good pedal hygiene and foot care. Stressed importance of controlling blood sugar.  3. Mechanical debridement of nails 1-5 bilaterally performed using a nail nipper. Filed with dremel without incident.  4. Excisional debridement of the painful callus lesions was performed using a chisel blade without incident or bleeding. 5. Bilateral compression anklets dispensed 6. Return to clinic in 3 mos.     Edrick Kins, DPM [facility]  Dr. Edrick Kins, [place]                                        [place], [place] [address]                Office [phone]  Fax [phone]

## 2017-04-30 ENCOUNTER — Telehealth: Payer: Self-pay | Admitting: *Deleted

## 2017-04-30 NOTE — Telephone Encounter (Signed)
Philip Matthews from [facility] calling to report INR on pt, it was 2.7. No missed doses, bleeding or changes in medication. Per dr Brett Albino pt is to keep dose the same and follow up in 1 week. Shelene Krage Kennon Holter, CMA

## 2017-05-06 ENCOUNTER — Other Ambulatory Visit: Payer: Self-pay | Admitting: Internal Medicine

## 2017-05-07 ENCOUNTER — Other Ambulatory Visit: Payer: Self-pay | Admitting: *Deleted

## 2017-05-07 ENCOUNTER — Telehealth: Payer: Self-pay | Admitting: Internal Medicine

## 2017-05-07 DIAGNOSIS — N183 Chronic kidney disease, stage 3 (moderate): Secondary | ICD-10-CM | POA: Diagnosis not present

## 2017-05-07 LAB — POCT INR: INR: 4.3

## 2017-05-07 NOTE — Telephone Encounter (Signed)
Philip Matthews saw pt today, pt has productive cough with yellow mucus. Vitals are good. Chest sounds clear. Philip Matthews would like to know what PCP would recommend for pt to take, something for a cold/ allergies. ep

## 2017-05-07 NOTE — Telephone Encounter (Signed)
Would recommend Mucinex for him. Thanks!

## 2017-05-08 NOTE — Telephone Encounter (Signed)
Totally fine! Thank you!

## 2017-05-08 NOTE — Telephone Encounter (Signed)
Contacted [name] with [facility], informed her of mucinex for pt, she verbalized understanding. Olivia Mackie also mentioned, it is time for recert for pt, as far as her helping him with pill box, INR etc... I went ahead and verbalized ok? Let me know if not ok!

## 2017-05-14 ENCOUNTER — Other Ambulatory Visit: Payer: Self-pay | Admitting: *Deleted

## 2017-05-14 LAB — POCT INR: INR: 1.5

## 2017-05-16 DIAGNOSIS — N183 Chronic kidney disease, stage 3 (moderate): Secondary | ICD-10-CM | POA: Diagnosis not present

## 2017-05-16 DIAGNOSIS — N2581 Secondary hyperparathyroidism of renal origin: Secondary | ICD-10-CM | POA: Diagnosis not present

## 2017-05-19 DIAGNOSIS — D631 Anemia in chronic kidney disease: Secondary | ICD-10-CM | POA: Diagnosis not present

## 2017-05-19 DIAGNOSIS — N2581 Secondary hyperparathyroidism of renal origin: Secondary | ICD-10-CM | POA: Diagnosis not present

## 2017-05-19 DIAGNOSIS — I129 Hypertensive chronic kidney disease with stage 1 through stage 4 chronic kidney disease, or unspecified chronic kidney disease: Secondary | ICD-10-CM | POA: Diagnosis not present

## 2017-05-19 DIAGNOSIS — N183 Chronic kidney disease, stage 3 (moderate): Secondary | ICD-10-CM | POA: Diagnosis not present

## 2017-05-21 ENCOUNTER — Other Ambulatory Visit: Payer: Self-pay | Admitting: *Deleted

## 2017-05-21 DIAGNOSIS — H913 Deaf nonspeaking, not elsewhere classified: Secondary | ICD-10-CM | POA: Diagnosis not present

## 2017-05-21 DIAGNOSIS — Z5181 Encounter for therapeutic drug level monitoring: Secondary | ICD-10-CM | POA: Diagnosis not present

## 2017-05-21 DIAGNOSIS — H401121 Primary open-angle glaucoma, left eye, mild stage: Secondary | ICD-10-CM | POA: Diagnosis not present

## 2017-05-21 DIAGNOSIS — Z7901 Long term (current) use of anticoagulants: Secondary | ICD-10-CM | POA: Diagnosis not present

## 2017-05-21 DIAGNOSIS — E119 Type 2 diabetes mellitus without complications: Secondary | ICD-10-CM | POA: Diagnosis not present

## 2017-05-21 DIAGNOSIS — H401113 Primary open-angle glaucoma, right eye, severe stage: Secondary | ICD-10-CM | POA: Diagnosis not present

## 2017-05-21 DIAGNOSIS — I808 Phlebitis and thrombophlebitis of other sites: Secondary | ICD-10-CM | POA: Diagnosis not present

## 2017-05-21 DIAGNOSIS — M329 Systemic lupus erythematosus, unspecified: Secondary | ICD-10-CM | POA: Diagnosis not present

## 2017-05-21 DIAGNOSIS — N183 Chronic kidney disease, stage 3 (moderate): Secondary | ICD-10-CM | POA: Diagnosis not present

## 2017-05-21 DIAGNOSIS — I129 Hypertensive chronic kidney disease with stage 1 through stage 4 chronic kidney disease, or unspecified chronic kidney disease: Secondary | ICD-10-CM | POA: Diagnosis not present

## 2017-05-21 LAB — POCT INR: INR: 2

## 2017-05-29 ENCOUNTER — Other Ambulatory Visit: Payer: Self-pay | Admitting: *Deleted

## 2017-05-29 LAB — POCT INR: INR: 2

## 2017-06-03 ENCOUNTER — Other Ambulatory Visit: Payer: Self-pay | Admitting: Internal Medicine

## 2017-06-04 ENCOUNTER — Telehealth: Payer: Self-pay | Admitting: *Deleted

## 2017-06-04 DIAGNOSIS — N183 Chronic kidney disease, stage 3 (moderate): Secondary | ICD-10-CM | POA: Diagnosis not present

## 2017-06-04 NOTE — Telephone Encounter (Signed)
Tracey nurse from [facility] calling in to report INR on patient, it was 2.5. No missed doses, bleeding, trips to ED or changes in diet. Per dr Nori Riis keep dose the same and check in 2 weeks. Sharrod Achille Kennon Holter, CMA

## 2017-06-17 ENCOUNTER — Telehealth: Payer: Self-pay | Admitting: Internal Medicine

## 2017-06-17 DIAGNOSIS — E038 Other specified hypothyroidism: Secondary | ICD-10-CM

## 2017-06-17 NOTE — Telephone Encounter (Signed)
Refill ordered. Please call patient and ask him to make a follow up appointment later this month. We have to recheck his TSH after the dose change in March.

## 2017-06-18 ENCOUNTER — Other Ambulatory Visit: Payer: Self-pay | Admitting: *Deleted

## 2017-06-18 DIAGNOSIS — E119 Type 2 diabetes mellitus without complications: Secondary | ICD-10-CM | POA: Diagnosis not present

## 2017-06-18 DIAGNOSIS — M329 Systemic lupus erythematosus, unspecified: Secondary | ICD-10-CM | POA: Diagnosis not present

## 2017-06-18 DIAGNOSIS — H913 Deaf nonspeaking, not elsewhere classified: Secondary | ICD-10-CM | POA: Diagnosis not present

## 2017-06-18 DIAGNOSIS — Z7901 Long term (current) use of anticoagulants: Secondary | ICD-10-CM | POA: Diagnosis not present

## 2017-06-18 DIAGNOSIS — I808 Phlebitis and thrombophlebitis of other sites: Secondary | ICD-10-CM | POA: Diagnosis not present

## 2017-06-18 DIAGNOSIS — N183 Chronic kidney disease, stage 3 (moderate): Secondary | ICD-10-CM | POA: Diagnosis not present

## 2017-06-18 DIAGNOSIS — Z5181 Encounter for therapeutic drug level monitoring: Secondary | ICD-10-CM | POA: Diagnosis not present

## 2017-06-18 DIAGNOSIS — I129 Hypertensive chronic kidney disease with stage 1 through stage 4 chronic kidney disease, or unspecified chronic kidney disease: Secondary | ICD-10-CM | POA: Diagnosis not present

## 2017-06-18 LAB — POCT INR: INR: 1.8

## 2017-06-18 NOTE — Telephone Encounter (Signed)
LM for patient to call back and schedule an appt with PCP. Colleen Donahoe,CMA

## 2017-06-18 NOTE — Telephone Encounter (Signed)
Appointment scheduled for 06-23-17 with PCP ep

## 2017-06-23 ENCOUNTER — Ambulatory Visit (INDEPENDENT_AMBULATORY_CARE_PROVIDER_SITE_OTHER): Payer: Medicare Other | Admitting: Internal Medicine

## 2017-06-23 VITALS — BP 101/60 | HR 62 | Temp 97.5°F | Wt 121.8 lb

## 2017-06-23 DIAGNOSIS — E1143 Type 2 diabetes mellitus with diabetic autonomic (poly)neuropathy: Secondary | ICD-10-CM | POA: Diagnosis not present

## 2017-06-23 DIAGNOSIS — E1121 Type 2 diabetes mellitus with diabetic nephropathy: Secondary | ICD-10-CM | POA: Diagnosis not present

## 2017-06-23 DIAGNOSIS — I10 Essential (primary) hypertension: Secondary | ICD-10-CM

## 2017-06-23 DIAGNOSIS — E039 Hypothyroidism, unspecified: Secondary | ICD-10-CM

## 2017-06-23 DIAGNOSIS — Z794 Long term (current) use of insulin: Secondary | ICD-10-CM | POA: Diagnosis not present

## 2017-06-23 LAB — POCT GLYCOSYLATED HEMOGLOBIN (HGB A1C): Hemoglobin A1C: 6.9

## 2017-06-23 NOTE — Progress Notes (Signed)
   [facility] Phone: [phone]  Subjective:  Philip Matthews is an 83 year old male presenting to clinic for follow-up of his HTN, T2DM, and hypothyroidism.  HTN: Taking Norvasc 5mg  daily, Hydralazine 50mg  tid, Lasix 20mg  daily, Metoprolol succinate 25mg  daily. Blood pressures are checked by the [facility] home health nurse every 2 weeks. Have been running from [id number] systolic. No dizziness, no weakness, no fatigue, no orthostasis.  T2DM: Checking blood sugars twice a day. Have been in the 200s. No lows. Taking Humalog 3 units tid and Lantus 4 units daily in the morning. No side effects.  Hypothyroidism: TSH 6.21 on 10/23/16, but T4 was 0.8. Had TSH checked by home health nurse in 03/2017 and it was 9.8. TSH was increased from 66mcg daily to 22mcg daily at that time. No constipation, no dry skin, no fatigue.  ROS: See HPI for pertinent positives and negatives  Past Medical History- hypertension, history of portal vein thrombosis on chronic anticoagulation, Barrett's esophagus, type 2 diabetes, hypothyroidism, anemia of chronic disease, CKD 3, hyperlipidemia  Family history reviewed for today's visit. No changes.  Social history- patient is a former smoker  Objective: BP 101/60 (BP Location: Right Arm, Patient Position: Sitting, Cuff Size: Normal)   Pulse 62   Temp 97.5 F (36.4 C) (Oral)   Wt 121 lb 12.8 oz (55.2 kg)   SpO2 97%   BMI 20.91 kg/m  Gen: NAD, alert, cooperative with exam, well-appearing HEENT: NCAT, EOMI, MMM Neck: FROM, supple, no thyromegaly or masses CV: RRR, no murmur Resp: CTABL, no wheezes, normal work of breathing Msk: No edema, warm, normal tone, moves UE/LE spontaneously Neuro: Alert and oriented, no gross deficits Skin: No rashes, no lesions Psych: Appropriate behavior Diabetic foot exam: Calluses noted near the head of the 5th metatarsal bilaterally, no other ulcerations or skin breakdown, sensation intact to touch and monofilament testing  bilaterally, PT and DP pulses intact bilaterally  Assessment/Plan: HTN: BP in clinic today was 90/60, recheck was 101/60. BPs have gradually been decreasing over the last year or so. No symptoms associated with low BPs, but worry about orthostatic hypotension and falls given his age. - Stop Norvasc - Continue checking BPs every 2 weeks. Patient will let us know if BPs start to increase or if he continues to have persistently low BPs.  - Continue Hydralazine, Lasix, Metoprolol succinate - Follow-up in 3 months.  T2DM: Last A1c 6.0% on 03/31/2017. Humalog dose was decreased from 6 units at breakfast, 6 units at lunch, and 4 units at dinner to 3 units three times per day at that visit because it was thought that his blood sugar was too tightly controlled. - Recheck A1c today - Continue Lantus 4 units daily and Humalog 3 units tid - Diabetic foot exam performed today. Patient has follow-up with podiatry scheduled in the near future. - Follow-up in 3 months  Hypothyroidism: Had TSH checked by home health nurse 03/2017 and it was 9.8. Synthroid was increased from 86mcg daily to 8mcg daily at that visit. Patient asymptomatic. - Recheck TSH today - Will call with results- may need to adjust dose. - Follow-up in 3 months.   Hyman Bible, MD PGY-2

## 2017-06-23 NOTE — Patient Instructions (Addendum)
It was wonderful to see you!  Your blood pressure was 90/60 today, which is a little low. Please stop taking your Amlodipine (Norvasc). Please have the [place] nurse check your blood pressure and give [place] a call if it starts getting high after stopping this medication. We can always restart it.  For your diabetes, please continue taking Lantus 4 units daily and Humalog 3 units three times per day.  We rechecked your thyroid level today. We will send a letter to your house to let you know the results.  -Dr. Brett Albino

## 2017-06-23 NOTE — Assessment & Plan Note (Signed)
BP in clinic today was 90/60, recheck was 101/60. BPs have gradually been decreasing over the last year or so. No symptoms associated with low BPs, but worry about orthostatic hypotension and falls given his age. - Stop Norvasc - Continue checking BPs every 2 weeks. Patient will let us know if BPs start to increase or if he continues to have persistently low BPs.  - Continue Hydralazine, Lasix, Metoprolol succinate - Follow-up in 3 months.

## 2017-06-23 NOTE — Assessment & Plan Note (Signed)
Had TSH checked by home health nurse 03/2017 and it was 9.8. Synthroid was increased from 32mcg daily to 58mcg daily at that visit. Patient asymptomatic. - Recheck TSH today - Will call with results- may need to adjust dose. - Follow-up in 3 months.

## 2017-06-23 NOTE — Assessment & Plan Note (Signed)
Last A1c 6.0% on 03/31/2017. Humalog dose was decreased from 6 units at breakfast, 6 units at lunch, and 4 units at dinner to 3 units three times per day at that visit because it was thought that his blood sugar was too tightly controlled. - Recheck A1c today - Continue Lantus 4 units daily and Humalog 3 units tid - Diabetic foot exam performed today. Patient has follow-up with podiatry scheduled in the near future. - Follow-up in 3 months

## 2017-06-24 LAB — TSH: TSH: 3.4 u[IU]/mL (ref 0.450–4.500)

## 2017-06-27 ENCOUNTER — Encounter: Payer: Self-pay | Admitting: *Deleted

## 2017-06-27 ENCOUNTER — Encounter: Payer: Self-pay | Admitting: Internal Medicine

## 2017-06-27 NOTE — Progress Notes (Signed)
Letter mailed to patient. Deshon Hsiao,CMA  

## 2017-07-01 ENCOUNTER — Other Ambulatory Visit: Payer: Self-pay | Admitting: Internal Medicine

## 2017-07-02 DIAGNOSIS — N183 Chronic kidney disease, stage 3 (moderate): Secondary | ICD-10-CM | POA: Diagnosis not present

## 2017-07-03 ENCOUNTER — Telehealth: Payer: Self-pay | Admitting: Internal Medicine

## 2017-07-03 ENCOUNTER — Other Ambulatory Visit: Payer: Self-pay | Admitting: *Deleted

## 2017-07-03 DIAGNOSIS — E1143 Type 2 diabetes mellitus with diabetic autonomic (poly)neuropathy: Secondary | ICD-10-CM

## 2017-07-03 LAB — POCT INR: INR: 3.4

## 2017-07-03 MED ORDER — INSULIN GLARGINE 100 UNITS/ML SOLOSTAR PEN: 4.0000 [IU] | PEN_INJECTOR | Freq: Every day | SUBCUTANEOUS | 5 refills | Status: DC

## 2017-07-03 NOTE — Telephone Encounter (Signed)
Verbal authorization for [facility] given to Tracery. Blood pressure better compared to his BP in office two weeks ago.

## 2017-07-03 NOTE — Telephone Encounter (Signed)
Tracey from [facility] called to request verbal orders for the patient to be re certified. She also wanted the doctor to know that his BP for yesterday was 112/60. Please call her with the verbal orders. jw

## 2017-07-03 NOTE — Telephone Encounter (Signed)
Will forward to MD to give verbal ok for recertification. Jazmin Hartsell,CMA

## 2017-07-04 ENCOUNTER — Encounter: Payer: Self-pay | Admitting: Internal Medicine

## 2017-07-08 ENCOUNTER — Encounter: Payer: Self-pay | Admitting: Internal Medicine

## 2017-07-09 ENCOUNTER — Other Ambulatory Visit: Payer: Self-pay | Admitting: *Deleted

## 2017-07-09 LAB — POCT INR: INR: 2.7

## 2017-07-16 ENCOUNTER — Other Ambulatory Visit: Payer: Self-pay | Admitting: *Deleted

## 2017-07-16 DIAGNOSIS — E119 Type 2 diabetes mellitus without complications: Secondary | ICD-10-CM | POA: Diagnosis not present

## 2017-07-16 DIAGNOSIS — I808 Phlebitis and thrombophlebitis of other sites: Secondary | ICD-10-CM | POA: Diagnosis not present

## 2017-07-16 DIAGNOSIS — I129 Hypertensive chronic kidney disease with stage 1 through stage 4 chronic kidney disease, or unspecified chronic kidney disease: Secondary | ICD-10-CM | POA: Diagnosis not present

## 2017-07-16 DIAGNOSIS — N183 Chronic kidney disease, stage 3 (moderate): Secondary | ICD-10-CM | POA: Diagnosis not present

## 2017-07-16 DIAGNOSIS — M329 Systemic lupus erythematosus, unspecified: Secondary | ICD-10-CM | POA: Diagnosis not present

## 2017-07-16 DIAGNOSIS — Z7901 Long term (current) use of anticoagulants: Secondary | ICD-10-CM | POA: Diagnosis not present

## 2017-07-16 DIAGNOSIS — Z794 Long term (current) use of insulin: Secondary | ICD-10-CM | POA: Diagnosis not present

## 2017-07-16 DIAGNOSIS — H913 Deaf nonspeaking, not elsewhere classified: Secondary | ICD-10-CM | POA: Diagnosis not present

## 2017-07-16 DIAGNOSIS — Z5181 Encounter for therapeutic drug level monitoring: Secondary | ICD-10-CM | POA: Diagnosis not present

## 2017-07-16 DIAGNOSIS — Z7982 Long term (current) use of aspirin: Secondary | ICD-10-CM | POA: Diagnosis not present

## 2017-07-16 LAB — POCT INR: INR: 1.8

## 2017-07-21 ENCOUNTER — Other Ambulatory Visit: Payer: Self-pay | Admitting: Internal Medicine

## 2017-07-21 DIAGNOSIS — M109 Gout, unspecified: Secondary | ICD-10-CM

## 2017-07-23 ENCOUNTER — Other Ambulatory Visit: Payer: Self-pay | Admitting: *Deleted

## 2017-07-23 ENCOUNTER — Telehealth: Payer: Self-pay | Admitting: Internal Medicine

## 2017-07-23 LAB — POCT INR: INR: 1.8

## 2017-07-23 NOTE — Telephone Encounter (Signed)
Reeves Forth, RN with [facility] calls with a few concerns for patient:  1. Patient is out of glucose test strips. She called the pharmacy and they stated that our office has been sent a "DWO form" and it needs to be completed before patient can get his strips.  2. Patient's fasting CBG have ranged from 174-233 for the past week. Olivia Mackie wanted to advise PCP in case medication needed to be adjusted.  3. Patient has been having right side and hip pain (tender to the touch). No recent injuries or falls and is not relieved by Tylenol. Olivia Mackie just wanted the PCP to be aware of this.    Olivia Mackie can be reached at [phone] with any additional questions.

## 2017-07-23 NOTE — Progress Notes (Signed)
INR checked by Olivia Mackie with [facility]. Patient is currently taking 5 mg Mon, Wed, Fri, Sat and 2.5 mg the rest of the week. No missed doses, no changes in medications or diet, no bruising or bleeding. Increase coumadin dose to 5 mg Mon, Wed, Fri, Sat, Sun and 2.5 mg on Tues, Thurs. Recheck in 1 week. Kasey Ewings, Kevin Fenton

## 2017-07-24 ENCOUNTER — Encounter: Payer: Self-pay | Admitting: Family Medicine

## 2017-07-24 ENCOUNTER — Ambulatory Visit (INDEPENDENT_AMBULATORY_CARE_PROVIDER_SITE_OTHER): Payer: Medicare Other | Admitting: Family Medicine

## 2017-07-24 DIAGNOSIS — M79651 Pain in right thigh: Secondary | ICD-10-CM | POA: Insufficient documentation

## 2017-07-24 DIAGNOSIS — E1143 Type 2 diabetes mellitus with diabetic autonomic (poly)neuropathy: Secondary | ICD-10-CM

## 2017-07-24 MED ORDER — GLUCOSE BLOOD VI STRP: ORAL_STRIP | 3 refills | Status: DC

## 2017-07-24 MED ORDER — ACETAMINOPHEN ER 650 MG PO TBCR: 650.0000 mg | EXTENDED_RELEASE_TABLET | Freq: Every day | ORAL | 1 refills | Status: DC | PRN

## 2017-07-24 NOTE — Telephone Encounter (Signed)
Discussed patient's CBGs with Olivia Mackie [facility] RN) over the phone. They have been elevated from 170s-230s over the last two weeks. Will increase Lantus from 4 units daily to 5 units daily. Will increase Humalog from 3 units tid to 4 units tid. Continue checking CBGs and let me know if he has any lows.  Hyman Bible, MD PGY-3

## 2017-07-24 NOTE — Telephone Encounter (Signed)
Returned [name]'s call and spoke with her over the phone. Will increased Lantus from 4 units daily to 5 units daily and will increase Humalog from 3 units tid to 4 units tid. If patient's hip pain does not get better, he will need to be seen in clinic.

## 2017-07-24 NOTE — Progress Notes (Signed)
   Subjective:    Patient ID: Philip Matthews , male   DOB: [birth year] , 83 y.o..   MRN: [id number]  HPI  Philip Matthews is A 83 year old male with history of diabetes, hypertension, CK D, hyperlipidemia, and chronic anticoagulation with Coumadin here for  Chief Complaint  Patient presents with  . Leg Pain   * Patient seen with ASL interpreter*  1. Right Leg Pain: Patient notes that he has had right thigh pain for about 1 week. He notes that one week ago he was walking around and at various stores looking furniture. Later that day he developed some pain in his right upper thigh. Patient notes that he does not typically walk around as much as he did that today. He notes that the pain is worse with ambulation and relieved with rest. He has not taking any medications for this besides over-the-counter Tylenol which did not help much. He denies any trauma to the area. Denies ever having this pain before. He is on chronic anticoagulation with Coumadin. Denies any numbness, tingling, weakness.  Review of Systems: Per HPI.   There are no preventive care reminders to display for this patient.  Past Medical History: Patient Active Problem List   Diagnosis Date Noted  . Right thigh pain 07/24/2017  . Osteoarthritis of left shoulder 04/07/2017  . Health care maintenance 10/23/2016  . Chronic anticoagulation 10/23/2016  . Elevated LFTs 03/18/2016  . Allergic rhinitis 09/08/2012  . Open-angle glaucoma 08/05/2012  . Hypothyroid 06/17/2012  . BARRETTS ESOPHAGUS 07/11/2010  . Mixed hyperlipidemia 06/20/2010  . Portal vein thrombosis 06/08/2010  . Gout 06/07/2010  . LIVER MASS 03/08/2010  . LUNG NODULE 03/08/2010  . Type II diabetes mellitus, controlled but with peripheral neuropathy 02/01/2010  . Anemia in chronic kidney disease 02/01/2010  . Isolated systolic HTN, goal SBP <124.  02/01/2010  . Chronic kidney disease (CKD), stage III (moderate) 02/01/2010    Medications: reviewed   Social  Hx:  reports that he quit smoking about 6 years ago. He has never used smokeless tobacco.   Objective:   BP (!) 158/72   Pulse 64   Temp 97.9 F (36.6 C) (Oral)   Wt 123 lb (55.8 kg)   SpO2 99%   BMI 21.11 kg/m  Physical Exam  Gen: NAD, alert, cooperative with exam, well-appearing Right thigh: Inspection shows no ecchymosis, swelling, or rash Palpation on right lateral thigh shows some tenderness, otherwise nontender throughout 5 out of 5 strength of the quadriceps Neurovascularly intact  Assessment & Plan:  Right thigh pain Lateral right thigh pain consistent with strain of the vastus lateralis likely from overuse when walking. Advised rest ice, compression, and Tylenol when necessary. Discussed return precautions and reasons to go to the emergency department. Will return to clinic if symptoms fail to improve as anticipated.   Meds ordered this encounter  Medications  . acetaminophen (TYLENOL) 650 MG CR tablet    Sig: Take 1 tablet (650 mg total) by mouth daily as needed for pain (Patient reports taking about 1 time a week).    Dispense:  30 tablet    Refill:  1    Smitty Cords, MD [place], PGY-3

## 2017-07-24 NOTE — Assessment & Plan Note (Signed)
Lateral right thigh pain consistent with strain of the vastus lateralis likely from overuse when walking. Advised rest ice, compression, and Tylenol when necessary. Discussed return precautions and reasons to go to the emergency department. Will return to clinic if symptoms fail to improve as anticipated.

## 2017-07-24 NOTE — Addendum Note (Signed)
Addended by: Carlyle Dolly on: 07/24/2017 06:19 PM   Modules accepted: Orders

## 2017-07-24 NOTE — Patient Instructions (Signed)
Muscle Strain A muscle strain is an injury that occurs when a muscle is stretched beyond its normal length. Usually a small number of muscle fibers are torn when this happens. Muscle strain is rated in degrees. First-degree strains have the least amount of muscle fiber tearing and pain. Second-degree and third-degree strains have increasingly more tearing and pain. Usually, recovery from muscle strain takes 1-2 weeks. Complete healing takes 5-6 weeks. What are the causes? Muscle strain happens when a sudden, violent force placed on a muscle stretches it too far. This may occur with lifting, sports, or a fall. What increases the risk? Muscle strain is especially common in athletes. What are the signs or symptoms? At the site of the muscle strain, there may be:  Pain.  Bruising.  Swelling.  Difficulty using the muscle due to pain or lack of normal function.  How is this diagnosed? Your health care provider will perform a physical exam and ask about your medical history. How is this treated? Often, the best treatment for a muscle strain is resting, icing, and applying cold compresses to the injured area. Follow these instructions at home:  Use the PRICE method of treatment to promote muscle healing during the first 2-3 days after your injury. The PRICE method involves: ? Protecting the muscle from being injured again. ? Restricting your activity and resting the injured body part. ? Icing your injury. To do this, put ice in a plastic bag. Place a towel between your skin and the bag. Then, apply the ice and leave it on from 15-20 minutes each hour. After the third day, switch to moist heat packs. ? Apply compression to the injured area with a splint or elastic bandage. Be careful not to wrap it too tightly. This may interfere with blood circulation or increase swelling. ? Elevate the injured body part above the level of your heart as often as you can.  Only take over-the-counter or  prescription medicines for pain, discomfort, or fever as directed by your health care provider.  Warming up prior to exercise helps to prevent future muscle strains. Contact a health care provider if:  You have increasing pain or swelling in the injured area.  You have numbness, tingling, or a significant loss of strength in the injured area. This information is not intended to replace advice given to you by your health care provider. Make sure you discuss any questions you have with your health care provider. Document Released: 12/16/2005 Document Revised: 05/23/2016 Document Reviewed: 07/15/2013 Elsevier Interactive Patient Education  2017 Elsevier Inc.  

## 2017-07-28 ENCOUNTER — Telehealth: Payer: Self-pay | Admitting: *Deleted

## 2017-07-28 DIAGNOSIS — E1143 Type 2 diabetes mellitus with diabetic autonomic (poly)neuropathy: Secondary | ICD-10-CM

## 2017-07-28 MED ORDER — GLUCOSE BLOOD VI STRP: ORAL_STRIP | 3 refills | Status: DC

## 2017-07-29 NOTE — Telephone Encounter (Signed)
Daughter in nurse clinic requesting refill for test strips.  Patient has been out since Wednesday of last week.  Test strips filled.  Derl Barrow, RN

## 2017-07-30 ENCOUNTER — Other Ambulatory Visit: Payer: Self-pay | Admitting: *Deleted

## 2017-07-30 DIAGNOSIS — I808 Phlebitis and thrombophlebitis of other sites: Secondary | ICD-10-CM | POA: Diagnosis not present

## 2017-07-30 DIAGNOSIS — E119 Type 2 diabetes mellitus without complications: Secondary | ICD-10-CM | POA: Diagnosis not present

## 2017-07-30 LAB — POCT INR: INR: 1.5

## 2017-08-07 ENCOUNTER — Telehealth: Payer: Self-pay | Admitting: *Deleted

## 2017-08-07 ENCOUNTER — Other Ambulatory Visit: Payer: Self-pay | Admitting: Internal Medicine

## 2017-08-07 NOTE — Telephone Encounter (Signed)
Olivia Mackie from [facility] calling to report pt INR, it is 2.1. He hasn't missed any doses, bleeding, and no trips to the ed. Per dr Valentina Lucks keep dose the same and recheck in 2 weeks. Marnette Perkins Kennon Holter, CMA

## 2017-08-13 ENCOUNTER — Other Ambulatory Visit: Payer: Self-pay | Admitting: *Deleted

## 2017-08-13 DIAGNOSIS — I129 Hypertensive chronic kidney disease with stage 1 through stage 4 chronic kidney disease, or unspecified chronic kidney disease: Secondary | ICD-10-CM | POA: Diagnosis not present

## 2017-08-13 DIAGNOSIS — I81 Portal vein thrombosis: Secondary | ICD-10-CM | POA: Diagnosis not present

## 2017-08-13 DIAGNOSIS — Z794 Long term (current) use of insulin: Secondary | ICD-10-CM | POA: Diagnosis not present

## 2017-08-13 DIAGNOSIS — B351 Tinea unguium: Secondary | ICD-10-CM | POA: Diagnosis not present

## 2017-08-13 DIAGNOSIS — M109 Gout, unspecified: Secondary | ICD-10-CM | POA: Diagnosis not present

## 2017-08-13 DIAGNOSIS — N183 Chronic kidney disease, stage 3 (moderate): Secondary | ICD-10-CM | POA: Diagnosis not present

## 2017-08-13 DIAGNOSIS — Z7982 Long term (current) use of aspirin: Secondary | ICD-10-CM | POA: Diagnosis not present

## 2017-08-13 DIAGNOSIS — E1122 Type 2 diabetes mellitus with diabetic chronic kidney disease: Secondary | ICD-10-CM | POA: Diagnosis not present

## 2017-08-13 DIAGNOSIS — D631 Anemia in chronic kidney disease: Secondary | ICD-10-CM | POA: Diagnosis not present

## 2017-08-13 DIAGNOSIS — Z7901 Long term (current) use of anticoagulants: Secondary | ICD-10-CM | POA: Diagnosis not present

## 2017-08-13 DIAGNOSIS — E1151 Type 2 diabetes mellitus with diabetic peripheral angiopathy without gangrene: Secondary | ICD-10-CM | POA: Diagnosis not present

## 2017-08-13 DIAGNOSIS — M329 Systemic lupus erythematosus, unspecified: Secondary | ICD-10-CM | POA: Diagnosis not present

## 2017-08-13 DIAGNOSIS — Z5181 Encounter for therapeutic drug level monitoring: Secondary | ICD-10-CM | POA: Diagnosis not present

## 2017-08-13 LAB — POCT INR: INR: 2.6

## 2017-08-14 DIAGNOSIS — N183 Chronic kidney disease, stage 3 (moderate): Secondary | ICD-10-CM | POA: Diagnosis not present

## 2017-08-14 DIAGNOSIS — N2581 Secondary hyperparathyroidism of renal origin: Secondary | ICD-10-CM | POA: Diagnosis not present

## 2017-08-19 DIAGNOSIS — N184 Chronic kidney disease, stage 4 (severe): Secondary | ICD-10-CM | POA: Diagnosis not present

## 2017-08-19 DIAGNOSIS — N2581 Secondary hyperparathyroidism of renal origin: Secondary | ICD-10-CM | POA: Diagnosis not present

## 2017-08-19 DIAGNOSIS — D631 Anemia in chronic kidney disease: Secondary | ICD-10-CM | POA: Diagnosis not present

## 2017-08-19 DIAGNOSIS — I129 Hypertensive chronic kidney disease with stage 1 through stage 4 chronic kidney disease, or unspecified chronic kidney disease: Secondary | ICD-10-CM | POA: Diagnosis not present

## 2017-08-20 ENCOUNTER — Ambulatory Visit (INDEPENDENT_AMBULATORY_CARE_PROVIDER_SITE_OTHER): Payer: Medicare Other | Admitting: Podiatry

## 2017-08-20 DIAGNOSIS — B351 Tinea unguium: Secondary | ICD-10-CM | POA: Diagnosis not present

## 2017-08-20 DIAGNOSIS — L84 Corns and callosities: Secondary | ICD-10-CM | POA: Diagnosis not present

## 2017-08-20 DIAGNOSIS — E0842 Diabetes mellitus due to underlying condition with diabetic polyneuropathy: Secondary | ICD-10-CM | POA: Diagnosis not present

## 2017-08-20 DIAGNOSIS — M79609 Pain in unspecified limb: Secondary | ICD-10-CM

## 2017-08-20 NOTE — Progress Notes (Signed)
   SUBJECTIVE Patient with a history of diabetes mellitus presents to office today complaining of elongated, thickened nails. Pain while ambulating in shoes. Patient is unable to trim their own nails.   OBJECTIVE General Patient is awake, alert, and oriented x 3 and in no acute distress. Derm hyperkeratotic callus lesions also noted to the bilateral feet 2. Skin is dry and supple bilateral. Negative open lesions or macerations. Remaining integument unremarkable. Nails are tender, long, thickened and dystrophic with subungual debris, consistent with onychomycosis, 1-5 bilateral. No signs of infection noted. Vasc  DP and PT pedal pulses palpable bilaterally. Temperature gradient within normal limits.  Neuro Epicritic and protective threshold sensation diminished bilaterally.  Musculoskeletal Exam No symptomatic pedal deformities noted bilateral. Muscular strength within normal limits.  ASSESSMENT 1. Diabetes Mellitus w/ peripheral neuropathy 2. Onychomycosis of nail due to dermatophyte bilateral 3. Pain in foot bilateral 4. Painful callus lesions bilateral feet 2 5. Bilateral lower extremity edema  PLAN OF CARE 1. Patient evaluated today. 2. Instructed to maintain good pedal hygiene and foot care. Stressed importance of controlling blood sugar.  3. Mechanical debridement of nails 1-5 bilaterally performed using a nail nipper. Filed with dremel without incident.  4. Excisional debridement of the painful callus lesions was performed using a chisel blade without incident or bleeding. 5. Bilateral compression anklets dispensed 6. Return to clinic in 3 mos.     Edrick Kins, DPM [facility]  Dr. Edrick Kins, [address]                                        [place], [place] [address]                Office [phone]  Fax [phone]

## 2017-08-25 ENCOUNTER — Encounter: Payer: Self-pay | Admitting: Internal Medicine

## 2017-08-26 ENCOUNTER — Other Ambulatory Visit (HOSPITAL_COMMUNITY): Payer: Self-pay | Admitting: *Deleted

## 2017-08-27 ENCOUNTER — Encounter: Payer: Self-pay | Admitting: Family Medicine

## 2017-08-27 ENCOUNTER — Other Ambulatory Visit (INDEPENDENT_AMBULATORY_CARE_PROVIDER_SITE_OTHER): Payer: Medicare Other

## 2017-08-27 ENCOUNTER — Encounter (HOSPITAL_COMMUNITY): Payer: Self-pay | Admitting: General Practice

## 2017-08-27 ENCOUNTER — Other Ambulatory Visit: Payer: Self-pay | Admitting: *Deleted

## 2017-08-27 ENCOUNTER — Inpatient Hospital Stay (HOSPITAL_COMMUNITY)
Admission: AD | Admit: 2017-08-27 | Discharge: 2017-08-29 | DRG: 379 | Disposition: A | Discharge status: Home Health | Payer: Medicare Other | Source: Ambulatory Visit | Attending: Family Medicine | Admitting: Family Medicine

## 2017-08-27 ENCOUNTER — Encounter (HOSPITAL_COMMUNITY): Payer: Medicare Other

## 2017-08-27 DIAGNOSIS — Z7901 Long term (current) use of anticoagulants: Secondary | ICD-10-CM

## 2017-08-27 DIAGNOSIS — Z8249 Family history of ischemic heart disease and other diseases of the circulatory system: Secondary | ICD-10-CM

## 2017-08-27 DIAGNOSIS — D631 Anemia in chronic kidney disease: Secondary | ICD-10-CM | POA: Diagnosis present

## 2017-08-27 DIAGNOSIS — E1122 Type 2 diabetes mellitus with diabetic chronic kidney disease: Secondary | ICD-10-CM | POA: Diagnosis present

## 2017-08-27 DIAGNOSIS — Z7951 Long term (current) use of inhaled steroids: Secondary | ICD-10-CM

## 2017-08-27 DIAGNOSIS — E875 Hyperkalemia: Secondary | ICD-10-CM | POA: Diagnosis present

## 2017-08-27 DIAGNOSIS — R791 Abnormal coagulation profile: Secondary | ICD-10-CM | POA: Diagnosis not present

## 2017-08-27 DIAGNOSIS — N183 Chronic kidney disease, stage 3 (moderate): Secondary | ICD-10-CM | POA: Diagnosis present

## 2017-08-27 DIAGNOSIS — Z87891 Personal history of nicotine dependence: Secondary | ICD-10-CM

## 2017-08-27 DIAGNOSIS — M109 Gout, unspecified: Secondary | ICD-10-CM | POA: Diagnosis present

## 2017-08-27 DIAGNOSIS — E039 Hypothyroidism, unspecified: Secondary | ICD-10-CM | POA: Diagnosis present

## 2017-08-27 DIAGNOSIS — Z8711 Personal history of peptic ulcer disease: Secondary | ICD-10-CM

## 2017-08-27 DIAGNOSIS — Z86718 Personal history of other venous thrombosis and embolism: Secondary | ICD-10-CM

## 2017-08-27 DIAGNOSIS — I8290 Acute embolism and thrombosis of unspecified vein: Secondary | ICD-10-CM

## 2017-08-27 DIAGNOSIS — D509 Iron deficiency anemia, unspecified: Secondary | ICD-10-CM | POA: Diagnosis present

## 2017-08-27 DIAGNOSIS — E782 Mixed hyperlipidemia: Secondary | ICD-10-CM | POA: Diagnosis present

## 2017-08-27 DIAGNOSIS — H4010X0 Unspecified open-angle glaucoma, stage unspecified: Secondary | ICD-10-CM | POA: Diagnosis present

## 2017-08-27 DIAGNOSIS — H9190 Unspecified hearing loss, unspecified ear: Secondary | ICD-10-CM | POA: Diagnosis present

## 2017-08-27 DIAGNOSIS — Z794 Long term (current) use of insulin: Secondary | ICD-10-CM

## 2017-08-27 DIAGNOSIS — H9193 Unspecified hearing loss, bilateral: Secondary | ICD-10-CM | POA: Diagnosis not present

## 2017-08-27 DIAGNOSIS — D649 Anemia, unspecified: Secondary | ICD-10-CM | POA: Diagnosis present

## 2017-08-27 DIAGNOSIS — K922 Gastrointestinal hemorrhage, unspecified: Secondary | ICD-10-CM | POA: Diagnosis present

## 2017-08-27 DIAGNOSIS — Z789 Other specified health status: Secondary | ICD-10-CM

## 2017-08-27 DIAGNOSIS — I129 Hypertensive chronic kidney disease with stage 1 through stage 4 chronic kidney disease, or unspecified chronic kidney disease: Secondary | ICD-10-CM | POA: Diagnosis present

## 2017-08-27 DIAGNOSIS — Z79899 Other long term (current) drug therapy: Secondary | ICD-10-CM

## 2017-08-27 DIAGNOSIS — K921 Melena: Principal | ICD-10-CM | POA: Diagnosis present

## 2017-08-27 DIAGNOSIS — Z7982 Long term (current) use of aspirin: Secondary | ICD-10-CM

## 2017-08-27 HISTORY — DX: Anemia, unspecified: D64.9

## 2017-08-27 HISTORY — DX: Hypothyroidism, unspecified: E03.9

## 2017-08-27 HISTORY — DX: Unspecified osteoarthritis, unspecified site: M19.90

## 2017-08-27 HISTORY — DX: Type 2 diabetes mellitus without complications: E11.9

## 2017-08-27 LAB — CBC WITH DIFFERENTIAL/PLATELET
BASOS PCT: 0 %
Basophils Absolute: 0 10*3/uL (ref 0.0–0.1)
EOS ABS: 0.2 10*3/uL (ref 0.0–0.7)
EOS PCT: 4 %
HEMATOCRIT: 26.8 % — AB (ref 39.0–52.0)
HEMOGLOBIN: 8 g/dL — AB (ref 13.0–17.0)
LYMPHS PCT: 22 %
Lymphs Abs: 1.2 10*3/uL (ref 0.7–4.0)
MCH: 25.4 pg — AB (ref 26.0–34.0)
MCHC: 29.9 g/dL — AB (ref 30.0–36.0)
MCV: 85.1 fL (ref 78.0–100.0)
Monocytes Absolute: 0.2 10*3/uL (ref 0.1–1.0)
Monocytes Relative: 4 %
NEUTROS PCT: 70 %
Neutro Abs: 4 10*3/uL (ref 1.7–7.7)
Platelets: 162 10*3/uL (ref 150–400)
RBC: 3.15 MIL/uL — ABNORMAL LOW (ref 4.22–5.81)
RDW: 18.1 % — ABNORMAL HIGH (ref 11.5–15.5)
WBC: 5.6 10*3/uL (ref 4.0–10.5)

## 2017-08-27 LAB — PROTIME-INR
INR: 3.56
PROTHROMBIN TIME: 35.3 s — AB (ref 11.4–15.2)

## 2017-08-27 LAB — POCT INR: INR: 5.2

## 2017-08-27 LAB — GLUCOSE, CAPILLARY: Glucose-Capillary: 254 mg/dL — ABNORMAL HIGH (ref 65–99)

## 2017-08-27 LAB — POCT HEMOGLOBIN: Hemoglobin: 8.5 g/dL — AB (ref 14.1–18.1)

## 2017-08-27 MED ORDER — PRAVASTATIN SODIUM 40 MG PO TABS
40.0000 mg | ORAL_TABLET | Freq: Every day | ORAL | Status: DC
  Administered 2017-08-27 – 2017-08-28 (×2): 40 mg via ORAL
  Filled 2017-08-27 (×2): qty 1

## 2017-08-27 MED ORDER — HYDRALAZINE HCL 50 MG PO TABS
50.0000 mg | ORAL_TABLET | Freq: Three times a day (TID) | ORAL | Status: DC
  Administered 2017-08-27 – 2017-08-28 (×3): 50 mg via ORAL
  Filled 2017-08-27 (×3): qty 1

## 2017-08-27 MED ORDER — COLCHICINE 0.6 MG PO TABS
0.3000 mg | ORAL_TABLET | Freq: Every day | ORAL | Status: DC
  Administered 2017-08-28 – 2017-08-29 (×2): 0.3 mg via ORAL
  Filled 2017-08-27 (×2): qty 1

## 2017-08-27 MED ORDER — BOOST / RESOURCE BREEZE PO LIQD
1.0000 | Freq: Three times a day (TID) | ORAL | Status: DC
  Administered 2017-08-27 – 2017-08-29 (×3): 1 via ORAL

## 2017-08-27 MED ORDER — SODIUM CHLORIDE 0.9% FLUSH
3.0000 mL | Freq: Two times a day (BID) | INTRAVENOUS | Status: DC
  Administered 2017-08-28: 3 mL via INTRAVENOUS
  Administered 2017-08-28: 10 mL via INTRAVENOUS
  Administered 2017-08-29: 3 mL via INTRAVENOUS

## 2017-08-27 MED ORDER — SODIUM CHLORIDE 0.9 % IV SOLN
510.0000 mg | INTRAVENOUS | Status: DC
  Filled 2017-08-27: qty 17

## 2017-08-27 MED ORDER — ALLOPURINOL 100 MG PO TABS
100.0000 mg | ORAL_TABLET | Freq: Every day | ORAL | Status: DC
  Administered 2017-08-28 – 2017-08-29 (×2): 100 mg via ORAL
  Filled 2017-08-27 (×2): qty 1

## 2017-08-27 MED ORDER — POLYETHYLENE GLYCOL 3350 17 G PO PACK
17.0000 g | PACK | Freq: Two times a day (BID) | ORAL | Status: DC
  Administered 2017-08-28 (×2): 17 g via ORAL
  Filled 2017-08-27 (×2): qty 1

## 2017-08-27 MED ORDER — SODIUM POLYSTYRENE SULFONATE 15 GM/60ML PO SUSP: 15.0000 g | Freq: Once | ORAL | Status: DC

## 2017-08-27 MED ORDER — ACETAMINOPHEN 325 MG PO TABS
650.0000 mg | ORAL_TABLET | Freq: Four times a day (QID) | ORAL | Status: DC | PRN
  Administered 2017-08-28: 650 mg via ORAL
  Filled 2017-08-27: qty 2

## 2017-08-27 MED ORDER — LATANOPROST 0.005 % OP SOLN
1.0000 [drp] | Freq: Every day | OPHTHALMIC | Status: DC
  Administered 2017-08-28 (×2): 1 [drp] via OPHTHALMIC
  Filled 2017-08-27: qty 2.5

## 2017-08-27 MED ORDER — SODIUM BICARBONATE 650 MG PO TABS
650.0000 mg | ORAL_TABLET | Freq: Two times a day (BID) | ORAL | Status: DC
  Administered 2017-08-27 – 2017-08-29 (×4): 650 mg via ORAL
  Filled 2017-08-27 (×4): qty 1

## 2017-08-27 MED ORDER — FERROUS SULFATE 325 (65 FE) MG PO TABS
325.0000 mg | ORAL_TABLET | Freq: Every day | ORAL | Status: DC
  Administered 2017-08-28 – 2017-08-29 (×2): 325 mg via ORAL
  Filled 2017-08-27 (×2): qty 1

## 2017-08-27 MED ORDER — METOPROLOL SUCCINATE ER 25 MG PO TB24
25.0000 mg | ORAL_TABLET | Freq: Every day | ORAL | Status: DC
  Administered 2017-08-28 – 2017-08-29 (×2): 25 mg via ORAL
  Filled 2017-08-27 (×2): qty 1

## 2017-08-27 MED ORDER — INSULIN GLARGINE 100 UNIT/ML ~~LOC~~ SOLN
5.0000 [IU] | Freq: Every day | SUBCUTANEOUS | Status: DC
  Administered 2017-08-28 – 2017-08-29 (×2): 5 [IU] via SUBCUTANEOUS
  Filled 2017-08-27 (×2): qty 0.05

## 2017-08-27 MED ORDER — LEVOTHYROXINE SODIUM 75 MCG PO TABS
75.0000 ug | ORAL_TABLET | Freq: Every day | ORAL | Status: DC
  Administered 2017-08-28 – 2017-08-29 (×2): 75 ug via ORAL
  Filled 2017-08-27 (×2): qty 1

## 2017-08-27 MED ORDER — PANTOPRAZOLE SODIUM 40 MG PO TBEC
40.0000 mg | DELAYED_RELEASE_TABLET | Freq: Every day | ORAL | Status: DC
  Administered 2017-08-27: 40 mg via ORAL
  Filled 2017-08-27: qty 1

## 2017-08-27 MED ORDER — ACETAMINOPHEN 650 MG RE SUPP: 650.0000 mg | Freq: Four times a day (QID) | RECTAL | Status: DC | PRN

## 2017-08-27 MED ORDER — FUROSEMIDE 40 MG PO TABS
40.0000 mg | ORAL_TABLET | Freq: Every day | ORAL | Status: DC
  Administered 2017-08-28 – 2017-08-29 (×2): 40 mg via ORAL
  Filled 2017-08-27 (×2): qty 1

## 2017-08-27 MED ORDER — INSULIN ASPART 100 UNIT/ML ~~LOC~~ SOLN
0.0000 [IU] | Freq: Three times a day (TID) | SUBCUTANEOUS | Status: DC
Start: 2017-08-28 — End: 2017-08-29
  Administered 2017-08-28: 1 [IU] via SUBCUTANEOUS
  Administered 2017-08-28: 2 [IU] via SUBCUTANEOUS
  Administered 2017-08-29: 5 [IU] via SUBCUTANEOUS

## 2017-08-27 MED ORDER — TIMOLOL MALEATE 0.5 % OP SOLN
1.0000 [drp] | Freq: Two times a day (BID) | OPHTHALMIC | Status: DC
  Administered 2017-08-28 (×3): 1 [drp] via OPHTHALMIC
  Filled 2017-08-27: qty 5

## 2017-08-27 NOTE — H&P (Signed)
[facility] Admission History and Physical Service Pager: [phone]  Patient name: Philip Matthews Medical record number: [id number] Date of birth: [birth year] Age: 83 y.o. Gender: male  Primary Care Provider: Mayo, Pete Pelt, MD Consultants: None Code Status: Full (per discussion on admission)  Chief Complaint: dark stool  Assessment and Plan: Philip Matthews is a 83 y.o. male presenting with dark stool, increased INR, and low hemoglobin. PMH is significant for chronic anticoagulation with warfarin for portal vein thrombosis, type 2 diabetes, HTN, HLD, Hypothyroidism, Glaucoma, gout, CKD III, Deaf  ?GI bleed with supratherapeutic INR and melena: Vitals stable on admission. INR 5.2 in the office today and endorsed 1 episode of melena. Hgb 8.0 on admission. Hx of PUD in 2011 per Epic. On lifelong anticoagulation with coumadin for portal vein thrombosis so is at risk for GI bleed. Is asymptomatic. If patient has GI bleed, likely upper. Patient is Jehovah witness, he is indifferent to blood transfusion but patient very against it.  - Admit to FPTS, med surg, attending Dr. Nori Riis - vitals per floor protocol - hold all anticoagulation for now - Repeat INR today - FOBT - clear liquid diet for now - Transfusion threshold is hgb of 7, if he reaches that, will need to have discussion with patient and the family - Consider GI consult in AM  Anemia: Hgb 8 on admission. Was 9.6 to 11.1 last year in 2017. On iron supplementation. Supposed to get iron infusion tomorrow - AM CBC - anemia panel - FOBT as above - Contact short stay tomorrow about iron infusion  Chronic Anticoagulation Portal Vein Thrombosis: On coumadin at home. Supratherapeutic at home with INR 5.2.  - Recheck INR - Need to discuss risks/benefits of chronic anticoagulation if patient has GI bleed  T2DM: Hgb A1C 6.9 in June 2018 - Continue home Lantus 5 units qhs - sensitive SSI  Hypertension: BP  167/67 -Continue home Hydralazine, Lasix, and metoprolol (this is his regimen per PCP note in July)  Hyperlipidemia:  - Continue home statin  Hypothyroidism: TSH 3.4 in June 2018 - continue home synthroid  Glaucoma:  - Continue home eye drops  Gout:  - Continue home    CKD III: Baseline Cr 1.5-2 over last year - BMP daily - Attempt to avoid nephrotoxic agents  Deaf:  -Needs sign language interpretor  ?Dementia: Family endorses patient cannot make decision for himself. Patient is alert and oriented x 3 on exam - Consider [place]   FEN/GI: SLIV, clear liquid diet Prophylaxis: None due to supratherapeutic INR and ?GI bleed  Disposition: Admit to FPTS, med surg, attending Dr. Nori Riis  History of Present Illness:  Philip Matthews is a 83 y.o. male presenting with dark stool, increased INR and hemoglobin.  *Sign language interpreter used for this visit*  Patient presented today in clinic and was noted to have a hemoglobin of 7. He endorsed having one dark stool yesterday which is unusual for him. No hematochezia. Additionally his INR was high at 5. He denies any lightheadedness, dizziness, nausea, vomiting, abdominal pain, diarrhea, constipation. Denies ever having a GI bleed in the past or ever having blood in stools or dark stools.  Patient is a Jehovah witness and his family does not want him to have any blood transfusions. Patient himself is not strongly against blood transfusions but his family is. Patient's son-in-law was at bedside and notes that the patient cannot make decisions for himself because he gets confused sometimes and that his daughter  is the healthcare power of attorney.   Review Of Systems: Per HPI with the following additions: see HPI  ROS  Patient Active Problem List   Diagnosis Date Noted  . Right thigh pain 07/24/2017  . Osteoarthritis of left shoulder 04/07/2017  . Health care maintenance 10/23/2016  . Chronic anticoagulation 10/23/2016  . Elevated LFTs  03/18/2016  . Allergic rhinitis 09/08/2012  . Open-angle glaucoma 08/05/2012  . Hypothyroid 06/17/2012  . BARRETTS ESOPHAGUS 07/11/2010  . Mixed hyperlipidemia 06/20/2010  . Portal vein thrombosis 06/08/2010  . Gout 06/07/2010  . LIVER MASS 03/08/2010  . LUNG NODULE 03/08/2010  . Type II diabetes mellitus, controlled but with peripheral neuropathy 02/01/2010  . Anemia in chronic kidney disease 02/01/2010  . Isolated systolic HTN, goal SBP <254.  02/01/2010  . Chronic kidney disease (CKD), stage III (moderate) 02/01/2010    Past Medical History: Past Medical History:  Diagnosis Date  . ARTHRITIS, LEFT SHOULDER 02/01/2010   Qualifier: Diagnosis of  By: Jacques Earthly    . Chronic kidney disease (CKD), stage IV (severe) ([place])   . Deaf   . Diabetes mellitus   . Diverticulosis   . DIVERTICULOSIS OF COLON 03/08/2010   Qualifier: Diagnosis of  By: Jacques Earthly    . Esophagitis    HSV  . Gout   . History of DVT (deep vein thrombosis)   . Hyperlipidemia   . Hypertension   . Kidney mass    lipoma  . Liver nodule    u/s 310  . Portal vein thrombosis    on life long coumadin  . PUD, HX OF 03/08/2010   Qualifier: History of  By: Jacques Earthly      Past Surgical History: Past Surgical History:  Procedure Laterality Date  . HERNIA REPAIR    . LIVER LOBECTOMY    . SHOULDER ARTHROCENTESIS     right shoulder    Social History: Social History  Substance Use Topics  . Smoking status: Former Smoker    Quit date: 01/18/2011  . Smokeless tobacco: Never Used  . Alcohol use No    Please also refer to relevant sections of EMR.  Family History: Family History  Problem Relation Age of Onset  . Cancer Father        prostate  . Heart disease Mother     Allergies and Medications: No Known Allergies No current facility-administered medications on file prior to encounter.    Current Outpatient Prescriptions on File Prior to Encounter  Medication Sig Dispense Refill   . acetaminophen (TYLENOL) 650 MG CR tablet Take 1 tablet (650 mg total) by mouth daily as needed for pain (Patient reports taking about 1 time a week). 30 tablet 1  . allopurinol (ZYLOPRIM) 100 MG tablet take 1 tablet by mouth once daily 90 tablet 1  . aspirin 81 MG EC tablet Take 81 mg by mouth daily. Over the counter    . Blood Glucose Monitoring Suppl (ONE TOUCH ULTRA 2) w/Device KIT 1 Device by Does not apply route once. 1 each 0  . calcitRIOL (ROCALTROL) 0.25 MCG capsule Take 1 capsule (0.25 mcg total) by mouth every Monday, Wednesday, and Friday. 30 capsule 2  . Calcium 500-125 MG-UNIT TABS Take 1 tablet by mouth 2 (two) times daily. Over the counter 60 each 2  . cloNIDine (CATAPRES - DOSED IN MG/24 HR) 0.2 mg/24hr patch APPLY 1 PATCH EVERY WEEK AS DIRECTED 12 patch 1  . colchicine 0.6 MG tablet take 1/2  tablet by mouth once daily 45 tablet 1  . docusate sodium (COLACE) 100 MG capsule Take 100 mg by mouth 2 (two) times daily. Over the counter    . epoetin alfa (PROCRIT) 87867 UNIT/ML injection Inject 1 mL (10,000 Units total) into the skin 3 (three) times a week. 1 mL   . ferrous sulfate (FERRO-BOB) 325 (65 FE) MG tablet Take 325 mg by mouth daily. Over the counter    . fluticasone (FLONASE) 50 MCG/ACT nasal spray Place 1 spray into both nostrils daily. Reported on 03/28/2016    . furosemide (LASIX) 40 MG tablet take 1/2 tablets by mouth once daily 45 tablet 1  . glucose blood (ONE TOUCH ULTRA TEST) test strip Check sugar up to 6 x daily 200 each 3  . hydrALAZINE (APRESOLINE) 50 MG tablet take 1 tablet by mouth three times a day 270 tablet 3  . insulin glargine (LANTUS) 100 unit/mL SOPN Inject 0.04 mLs (4 Units total) into the skin daily before breakfast. (Patient taking differently: Inject 5 Units into the skin daily before breakfast. ) 15 mL 5  . insulin lispro (HUMALOG KWIKPEN) 100 UNIT/ML KiwkPen Inject 0.03 mLs (3 Units total) into the skin 3 (three) times daily with meals. (Patient  taking differently: Inject 4 Units into the skin 3 (three) times daily with meals. ) 15 mL 2  . Lancets (ONETOUCH ULTRASOFT) lancets Check sugar up to 6 x daily 100 each 12  . latanoprost (XALATAN) 0.005 % ophthalmic solution Place 1 drop into both eyes at bedtime.    Marland Kitchen levothyroxine (SYNTHROID, LEVOTHROID) 75 MCG tablet take 1 tablet by mouth once daily 90 tablet 0  . metoprolol succinate (TOPROL-XL) 25 MG 24 hr tablet take 1 tablet by mouth once daily 90 tablet 1  . Multiple Vitamin (MULTIVITAMIN) tablet Take 1 tablet by mouth daily. Over the counter    . NOVOFINE 32G X 6 MM MISC USE 3 TIMES DAILY 200 each 2  . omeprazole (PRILOSEC) 20 MG capsule take 1 capsule by mouth once daily 90 capsule 1  . ONETOUCH DELICA LANCETS FINE MISC 1 each by Does not apply route every morning. 100 each 11  . polyethylene glycol powder (GLYCOLAX/MIRALAX) powder Take 17 g by mouth 2 (two) times daily. Hold for loose stools 527 g 6  . pravastatin (PRAVACHOL) 40 MG tablet take 1 tablet by mouth once daily 90 tablet 1  . sodium bicarbonate 650 MG tablet Take 1 tablet (650 mg total) by mouth 2 (two) times daily. 180 tablet 3  . sodium polystyrene (KAYEXALATE) 15 GM/60ML suspension Take 60 mLs (15 g total) by mouth once. 3x weekly (tu/th/sat) to prevent high potassium. 500 mL 11  . timolol (TIMOPTIC) 0.5 % ophthalmic solution Place 1 drop into both eyes as directed.     . vitamin B-12 (CYANOCOBALAMIN) 100 MCG tablet Take 100 mcg by mouth daily. Over the counter    . warfarin (COUMADIN) 5 MG tablet TAKE 1 TO 1 AND 1/2 TABLETS DAILY AS DIRECTED 135 tablet 3    Objective: BP (!) 152/66 (BP Location: Right Arm)   Pulse 63   Temp 97.7 F (36.5 C) (Oral)   Resp 16   SpO2 100%  Exam: General: Thin elderly African-American male laying in bed in no acute distress Eyes: Pupils equal and reactive to light, sclera slightly hyperpigmented ENTM: Moist mucous membranes, no pharyngeal erythema Neck: Supple Cardiovascular:  Regular rate and rhythm, normal S1 S2, no murmurs appreciated, 2+ DP pulses bilaterally Respiratory:  Normal work of breathing, clear to auscultation bilaterally Gastrointestinal: Soft, nondistended, nontender, normal bowel sounds, well-healed elliptical incisional scar extending from left lower quadrant to left upper quadrant  MSK: Moves all extremities spontaneously  Derm: No rashes  Neuro: Alert and oriented to person place and time, cranial nerves II through XII intact, 5 out of 5 strength in bilateral upper and lower extremities, sensation grossly intact throughout  Psych: Normal mood and affect   Labs and Imaging: CBC BMET   Recent Labs Lab 08/27/17 1630  HGB 8.5*   No results for input(s): NA, K, CL, CO2, BUN, CREATININE, GLUCOSE, CALCIUM in the last 168 hours.     Carlyle Dolly, MD 08/27/2017, 5:26 PM PGY-3, [place] [facility] pager: [phone], text pages welcome

## 2017-08-27 NOTE — Progress Notes (Signed)
MD gave verbal order for clear liquid diet. Orders placed. Will continue to monitor.

## 2017-08-27 NOTE — Progress Notes (Signed)
New Admission Note:   Arrival Method: WC Mental Orientation: A&O X4 Telemetry: Not ordered Assessment: Completed Skin: WDL IV: IV to be placed.  Pain: 8/10  Safety Measures: Safety Fall Prevention Plan has been given, discussed and signed Admission: Completed Unit Orientation: Patient has been orientated to the room, unit and staff.  Family: Son-in-law at bedside  Orders have been reviewed and implemented. Will continue to monitor the patient. Call light has been placed within reach and bed alarm has been activated.    Dixie Dials RN, BSN

## 2017-08-28 ENCOUNTER — Encounter (HOSPITAL_COMMUNITY): Payer: Self-pay | Admitting: *Deleted

## 2017-08-28 ENCOUNTER — Ambulatory Visit (HOSPITAL_COMMUNITY): Admission: RE | Admit: 2017-08-28 | Payer: Medicare Other | Source: Ambulatory Visit

## 2017-08-28 DIAGNOSIS — K921 Melena: Secondary | ICD-10-CM | POA: Diagnosis not present

## 2017-08-28 DIAGNOSIS — E782 Mixed hyperlipidemia: Secondary | ICD-10-CM | POA: Diagnosis present

## 2017-08-28 DIAGNOSIS — R791 Abnormal coagulation profile: Secondary | ICD-10-CM | POA: Diagnosis not present

## 2017-08-28 DIAGNOSIS — Z789 Other specified health status: Secondary | ICD-10-CM

## 2017-08-28 DIAGNOSIS — I8290 Acute embolism and thrombosis of unspecified vein: Secondary | ICD-10-CM | POA: Diagnosis not present

## 2017-08-28 DIAGNOSIS — Z86718 Personal history of other venous thrombosis and embolism: Secondary | ICD-10-CM | POA: Diagnosis not present

## 2017-08-28 DIAGNOSIS — Z87891 Personal history of nicotine dependence: Secondary | ICD-10-CM | POA: Diagnosis not present

## 2017-08-28 DIAGNOSIS — Z8711 Personal history of peptic ulcer disease: Secondary | ICD-10-CM | POA: Diagnosis not present

## 2017-08-28 DIAGNOSIS — Z7951 Long term (current) use of inhaled steroids: Secondary | ICD-10-CM | POA: Diagnosis not present

## 2017-08-28 DIAGNOSIS — D631 Anemia in chronic kidney disease: Secondary | ICD-10-CM | POA: Diagnosis present

## 2017-08-28 DIAGNOSIS — E875 Hyperkalemia: Secondary | ICD-10-CM | POA: Diagnosis present

## 2017-08-28 DIAGNOSIS — Z794 Long term (current) use of insulin: Secondary | ICD-10-CM | POA: Diagnosis not present

## 2017-08-28 DIAGNOSIS — E1122 Type 2 diabetes mellitus with diabetic chronic kidney disease: Secondary | ICD-10-CM | POA: Diagnosis present

## 2017-08-28 DIAGNOSIS — H4010X0 Unspecified open-angle glaucoma, stage unspecified: Secondary | ICD-10-CM | POA: Diagnosis present

## 2017-08-28 DIAGNOSIS — Z8249 Family history of ischemic heart disease and other diseases of the circulatory system: Secondary | ICD-10-CM | POA: Diagnosis not present

## 2017-08-28 DIAGNOSIS — Z7901 Long term (current) use of anticoagulants: Secondary | ICD-10-CM | POA: Diagnosis not present

## 2017-08-28 DIAGNOSIS — K922 Gastrointestinal hemorrhage, unspecified: Secondary | ICD-10-CM | POA: Diagnosis present

## 2017-08-28 DIAGNOSIS — E039 Hypothyroidism, unspecified: Secondary | ICD-10-CM | POA: Diagnosis present

## 2017-08-28 DIAGNOSIS — D509 Iron deficiency anemia, unspecified: Secondary | ICD-10-CM | POA: Diagnosis present

## 2017-08-28 DIAGNOSIS — M109 Gout, unspecified: Secondary | ICD-10-CM | POA: Diagnosis present

## 2017-08-28 DIAGNOSIS — D649 Anemia, unspecified: Secondary | ICD-10-CM | POA: Diagnosis not present

## 2017-08-28 DIAGNOSIS — N183 Chronic kidney disease, stage 3 (moderate): Secondary | ICD-10-CM | POA: Diagnosis present

## 2017-08-28 DIAGNOSIS — I129 Hypertensive chronic kidney disease with stage 1 through stage 4 chronic kidney disease, or unspecified chronic kidney disease: Secondary | ICD-10-CM | POA: Diagnosis present

## 2017-08-28 DIAGNOSIS — Z7982 Long term (current) use of aspirin: Secondary | ICD-10-CM | POA: Diagnosis not present

## 2017-08-28 DIAGNOSIS — H9193 Unspecified hearing loss, bilateral: Secondary | ICD-10-CM

## 2017-08-28 DIAGNOSIS — Z79899 Other long term (current) drug therapy: Secondary | ICD-10-CM | POA: Diagnosis not present

## 2017-08-28 DIAGNOSIS — H9190 Unspecified hearing loss, unspecified ear: Secondary | ICD-10-CM | POA: Diagnosis present

## 2017-08-28 LAB — CBC
HCT: 25.6 % — ABNORMAL LOW (ref 39.0–52.0)
HEMOGLOBIN: 7.6 g/dL — AB (ref 13.0–17.0)
MCH: 25 pg — AB (ref 26.0–34.0)
MCHC: 29.7 g/dL — AB (ref 30.0–36.0)
MCV: 84.2 fL (ref 78.0–100.0)
Platelets: 132 10*3/uL — ABNORMAL LOW (ref 150–400)
RBC: 3.04 MIL/uL — AB (ref 4.22–5.81)
RDW: 17.8 % — AB (ref 11.5–15.5)
WBC: 3.9 10*3/uL — AB (ref 4.0–10.5)

## 2017-08-28 LAB — BASIC METABOLIC PANEL
ANION GAP: 5 (ref 5–15)
Anion gap: 8 (ref 5–15)
BUN: 80 mg/dL — ABNORMAL HIGH (ref 6–20)
BUN: 68 mg/dL — AB (ref 6–20)
CHLORIDE: 106 mmol/L (ref 101–111)
CO2: 23 mmol/L (ref 22–32)
CO2: 23 mmol/L (ref 22–32)
CREATININE: 2.15 mg/dL — AB (ref 0.61–1.24)
Calcium: 9.5 mg/dL (ref 8.9–10.3)
Calcium: 9.6 mg/dL (ref 8.9–10.3)
Chloride: 109 mmol/L (ref 101–111)
Creatinine, Ser: 2.07 mg/dL — ABNORMAL HIGH (ref 0.61–1.24)
GFR calc Af Amer: 30 mL/min — ABNORMAL LOW (ref >60)
GFR, EST AFRICAN AMERICAN: 32 mL/min — AB (ref >60)
GFR, EST NON AFRICAN AMERICAN: 27 mL/min — AB (ref >60)
GFR, EST NON AFRICAN AMERICAN: 26 mL/min — AB (ref >60)
GLUCOSE: 199 mg/dL — AB (ref 65–99)
Glucose, Bld: 75 mg/dL (ref 65–99)
POTASSIUM: 7 mmol/L — AB (ref 3.5–5.1)
Potassium: 5.5 mmol/L — ABNORMAL HIGH (ref 3.5–5.1)
SODIUM: 137 mmol/L (ref 135–145)
Sodium: 137 mmol/L (ref 135–145)

## 2017-08-28 LAB — GLUCOSE, CAPILLARY
GLUCOSE-CAPILLARY: 146 mg/dL — AB (ref 65–99)
GLUCOSE-CAPILLARY: 189 mg/dL — AB (ref 65–99)
GLUCOSE-CAPILLARY: 82 mg/dL (ref 65–99)
Glucose-Capillary: 114 mg/dL — ABNORMAL HIGH (ref 65–99)

## 2017-08-28 LAB — IRON AND TIBC
Iron: 45 ug/dL (ref 45–182)
SATURATION RATIOS: 18 % (ref 17.9–39.5)
TIBC: 252 ug/dL (ref 250–450)
UIBC: 207 ug/dL

## 2017-08-28 LAB — FERRITIN: FERRITIN: 41 ng/mL (ref 24–336)

## 2017-08-28 LAB — RETICULOCYTES
RBC.: 3.04 MIL/uL — ABNORMAL LOW (ref 4.22–5.81)
RETIC COUNT ABSOLUTE: 76 10*3/uL (ref 19.0–186.0)
Retic Ct Pct: 2.5 % (ref 0.4–3.1)

## 2017-08-28 LAB — VITAMIN B12: Vitamin B-12: 3684 pg/mL — ABNORMAL HIGH (ref 180–914)

## 2017-08-28 LAB — PROTIME-INR
INR: 3.59
Prothrombin Time: 35.6 seconds — ABNORMAL HIGH (ref 11.4–15.2)

## 2017-08-28 LAB — FOLATE: FOLATE: 36 ng/mL (ref >5.9)

## 2017-08-28 MED ORDER — SODIUM CHLORIDE 0.9 % IV SOLN: 510.0000 mg | Freq: Once | INTRAVENOUS | Status: DC

## 2017-08-28 MED ORDER — SODIUM CHLORIDE 0.9 % IV SOLN
510.0000 mg | Freq: Once | INTRAVENOUS | Status: AC
  Administered 2017-08-28: 510 mg via INTRAVENOUS
  Filled 2017-08-28: qty 17

## 2017-08-28 MED ORDER — SODIUM POLYSTYRENE SULFONATE 15 GM/60ML PO SUSP
30.0000 g | Freq: Once | ORAL | Status: AC
  Administered 2017-08-28: 30 g via ORAL
  Filled 2017-08-28: qty 120

## 2017-08-28 MED ORDER — HYDRALAZINE HCL 25 MG PO TABS
25.0000 mg | ORAL_TABLET | Freq: Three times a day (TID) | ORAL | Status: DC
  Administered 2017-08-28 – 2017-08-29 (×2): 25 mg via ORAL
  Filled 2017-08-28 (×2): qty 1

## 2017-08-28 MED ORDER — PANTOPRAZOLE SODIUM 40 MG PO TBEC
40.0000 mg | DELAYED_RELEASE_TABLET | Freq: Two times a day (BID) | ORAL | Status: DC
  Administered 2017-08-28 – 2017-08-29 (×3): 40 mg via ORAL
  Filled 2017-08-28 (×3): qty 1

## 2017-08-28 NOTE — Discharge Summary (Signed)
[facility] Discharge Summary  Patient name: Philip Matthews Medical record number: [id number] Date of birth: 11-08-1928 Age: 83 y.o. Gender: male Date of Admission: 08/27/2017  Date of Discharge: 08/29/2017 Admitting Physician: Kinnie Feil, MD  Primary Care Provider: [name], MD Consultants: None  Indication for Hospitalization: Melena with Supratherapeutic INR  Discharge Diagnoses/Problem List:  Supratherapeuthic INR Melina Anemia Hyperkalemia H/o Portal Vein Thrombosis w/ Chronic Anticoagulation T2DM HTN HLD Hypothyroidism Glaucoma Gout CKD III Deaf  Disposition: Home  Discharge Condition: Improved  Discharge Exam:  General: nad, alert in bed, requires sign language interpreter Cardiovascular: rrr, no mrg Respiratory: ctab, no increased work of breathing Abdomen: soft, nontender Extremities: no lower extremity edema, 5/5 strength throughout  Brief Hospital Course:  Pt was admitted to the hospital from clinic due to a history of one dark stool with an INR of 5.2. On admission, his hemoglobin was 8.0. Patient is chronically anemic with iron deficiency with hemoglobin ranging between 9.6-11.4 . He did receive one dose of ferriheme in the hospital. He was also found to be hyperkalemic with Serum K of 7. EKG did not show any peaked T-waves. Serum K improved to 5.5 after kayexalate. Pt has h/o portal vein thrombosis and has been on anticoagulation. After discussion w/ family of risk and benefit of anticoagulation, it was decided that anticoagulation would not be continued at discharge.   Issues for Follow Up:  1. Portal Vein Thrombosis. Consider further work up to evaluate thrombosis by vascular surgery/hematology.  2. Hemoglobin - Pt hemoglobin was low, but stable on discharge. Please recheck hemoglobin at next appointment.  3. Hyperkalemia - Hyperkalemic on admission, down trending at discharge. Please recheck BMP to ensure that this  is stable. Also have patient f/u with nephrology given unknown etiology and h/o CKD III. 4. Statin - Pt is on Prevastatin 40 mg. May benefit from higher intensity statin given low HDL and h/o diabetes.  Significant Procedures: None  Significant Labs and Imaging:   Recent Labs Lab 08/27/17 1907 08/28/17 0344 08/29/17 0351  WBC 5.6 3.9* 3.7*  HGB 8.0* 7.6* 7.6*  HCT 26.8* 25.6* 25.4*  PLT 162 132* 126*    Recent Labs Lab 08/28/17 0344 08/28/17 2044 08/29/17 0351  NA 137 137 138  K 7.0* 5.5* 5.5*  CL 109 106 108  CO2 '23 23 24  ' GLUCOSE 199* 75 100*  BUN 80* 68* 71*  CREATININE 2.07* 2.15* 2.29*  CALCIUM 9.5 9.6 9.0   Anemia Panel Iron 45 UIBC 207 TIBC 18 Ferritin 41 Folate 36.0 Vit B12 3684    Results/Tests Pending at Time of Discharge: FOBT  Discharge Medications:  Allergies as of 08/29/2017   No Known Allergies     Medication List    STOP taking these medications   amLODipine 10 MG tablet Commonly known as:  NORVASC   aspirin 81 MG EC tablet   cloNIDine 0.2 mg/24hr patch Commonly known as:  CATAPRES - Dosed in mg/24 hr   omeprazole 20 MG capsule Commonly known as:  PRILOSEC   warfarin 5 MG tablet Commonly known as:  COUMADIN     TAKE these medications   acetaminophen 650 MG CR tablet Commonly known as:  TYLENOL Take 1 tablet (650 mg total) by mouth daily as needed for pain (Patient reports taking about 1 time a week).   allopurinol 100 MG tablet Commonly known as:  ZYLOPRIM take 1 tablet by mouth once daily   calcitRIOL 0.25 MCG capsule Commonly  known as:  ROCALTROL Take 1 capsule (0.25 mcg total) by mouth every Monday, Wednesday, and Friday.   Calcium 500-125 MG-UNIT Tabs Take 1 tablet by mouth 2 (two) times daily. Over the counter   colchicine 0.6 MG tablet take 1/2 tablet by mouth once daily What changed:  See the new instructions.   docusate sodium 100 MG capsule Commonly known as:  COLACE Take 100 mg by mouth 2 (two) times  daily. Over the counter   epoetin alfa 10000 UNIT/ML injection Commonly known as:  PROCRIT Inject 1 mL (10,000 Units total) into the skin 3 (three) times a week.   feeding supplement Liqd Take 1 Container by mouth 3 (three) times daily between meals.   FERRO-BOB 325 (65 FE) MG tablet Generic drug:  ferrous sulfate Take 325 mg by mouth daily. Over the counter   fluticasone 50 MCG/ACT nasal spray Commonly known as:  FLONASE Place 1 spray into both nostrils daily. Reported on 03/28/2016   furosemide 40 MG tablet Commonly known as:  LASIX take 1/2 tablets by mouth once daily What changed:  See the new instructions.   glucose blood test strip Commonly known as:  ONE TOUCH ULTRA TEST Check sugar up to 6 x daily   hydrALAZINE 50 MG tablet Commonly known as:  APRESOLINE take 1 tablet by mouth three times a day What changed:  See the new instructions.   insulin glargine 100 unit/mL Sopn Commonly known as:  LANTUS Inject 0.04 mLs (4 Units total) into the skin daily before breakfast. What changed:  how much to take   insulin lispro 100 UNIT/ML KiwkPen Commonly known as:  HUMALOG KWIKPEN Inject 0.03 mLs (3 Units total) into the skin 3 (three) times daily with meals. What changed:  how much to take   latanoprost 0.005 % ophthalmic solution Commonly known as:  XALATAN Place 1 drop into both eyes at bedtime.   levothyroxine 75 MCG tablet Commonly known as:  SYNTHROID, LEVOTHROID take 1 tablet by mouth once daily What changed:  See the new instructions.   metoprolol succinate 25 MG 24 hr tablet Commonly known as:  TOPROL-XL take 1 tablet by mouth once daily   multivitamin tablet Take 1 tablet by mouth daily. Over the counter   NOVOFINE 32G X 6 MM Misc Generic drug:  Insulin Pen Needle USE 3 TIMES DAILY   ONE TOUCH ULTRA 2 w/Device Kit 1 Device by Does not apply route once.   ONETOUCH DELICA LANCETS FINE Misc 1 each by Does not apply route every morning.   onetouch  ultrasoft lancets Check sugar up to 6 x daily   pantoprazole 40 MG tablet Commonly known as:  PROTONIX Take 1 tablet (40 mg total) by mouth 2 (two) times daily.   polyethylene glycol powder powder Commonly known as:  GLYCOLAX/MIRALAX Take 17 g by mouth 2 (two) times daily. Hold for loose stools   pravastatin 40 MG tablet Commonly known as:  PRAVACHOL take 1 tablet by mouth once daily What changed:  See the new instructions.   sodium bicarbonate 650 MG tablet Take 1 tablet (650 mg total) by mouth 2 (two) times daily.   sodium polystyrene 15 GM/60ML suspension Commonly known as:  KAYEXALATE Take 60 mLs (15 g total) by mouth once. 3x weekly (tu/th/sat) to prevent high potassium.   timolol 0.5 % ophthalmic solution Commonly known as:  TIMOPTIC Place 1 drop into both eyes as directed.   vitamin B-12 100 MCG tablet Commonly known as:  CYANOCOBALAMIN Take  100 mcg by mouth daily. Over the counter            Discharge Care Instructions        Start     Ordered   08/29/17 0000  feeding supplement (BOOST / RESOURCE BREEZE) LIQD  3 times daily between meals     08/29/17 1159   08/29/17 0000  Increase activity slowly     08/29/17 1159   08/29/17 0000  pantoprazole (PROTONIX) 40 MG tablet  2 times daily     08/29/17 1339      Discharge Instructions: Please refer to Patient Instructions section of EMR for full details.  Patient was counseled important signs and symptoms that should prompt return to medical care, changes in medications, dietary instructions, activity restrictions, and follow up appointments.   Follow-Up Appointments: Follow-up Information    Mayo, Pete Pelt, MD Follow up on 09/09/2017.   Specialty:  Family Medicine Why:  Please arrive 15 minutes early for your 2:30 PM appointment. Contact information: [place] [address] [phone]           Bonnita Hollow, MD 09/01/2017, 5:12 PM PGY-1, [place]

## 2017-08-28 NOTE — Progress Notes (Signed)
[facility]  Patient Status: Active (receiving services up to time of hospitalization)  [facility] is providing the following services: RN  If patient discharges after hours, please call [phone].   Philip Matthews 08/28/2017, 11:12 AM

## 2017-08-28 NOTE — Care Management Note (Addendum)
Case Management Note  Patient Details  Name: Philip Matthews MRN: [id number] Date of Birth: [birth year]  Subjective/Objective:    CM following for progression and d/c planning.     Pt and family several family members are deaf. Interp. Used to explain OBS status and to eval needs.             Action/Plan: 08/28/2017 met with pt and family, pt lives alone, very supportive family. Will follow for d/c needs.   Expected Discharge Date:                  Expected Discharge Plan:  [place]  In-House Referral:  NA  Discharge planning Services     Post Acute Care Choice:    Choice offered to:     DME Arranged:    DME Agency:     HH Arranged:    HH Agency:     Status of Service:  In process, will continue to follow  If discussed at Long Length of Stay Meetings, dates discussed:    Additional Comments:  Adron Bene, RN 08/28/2017, 11:57 AM

## 2017-08-28 NOTE — Progress Notes (Signed)
Family Medicine Teaching Service Daily Progress Note Intern Pager: [phone]  Patient name: Philip Matthews Medical record number: [id number] Date of birth: 27-Jun-1928 Age: 83 y.o. Gender: male  Primary Care Provider: Mayo, Pete Pelt, MD Consultants: None Code Status: Full  Pt Overview and Major Events to Date:  Philip Matthews is a 83 y.o. male presenting with dark stool, increased INR, and low hemoglobin. PMH is significant for chronic anticoagulation with warfarin for portal vein thrombosis, type 2 diabetes, HTN, HLD, Hypothyroidism, Glaucoma, gout, CKD III, Deaf  Assessment and Plan:  ?GI bleed with supratherapeutic INR and melena: VSS. INR improved to 3.5. in the office today and endorsed 1 episode of melena.  - Admit to FPTS, med surg, attending Dr. Nori Riis - vitals per floor protocol - hold all anticoagulation for now - Transfusion threshold is hgb of 7, if he reaches that, will need to have discussion with patient and the family - protonix 40mg  bid  Anemia: Worse. 7.6 on 08/30. Hgb 8 on admission. Was 9.6 to 11.1 last year in 2017. On iron supplementation.  - AM CBC - will give IV ferriheme   Hyperkalemia: K 7.0 on 08/30. Unknown etiology. Pt has no complaints of palpitations, cp, dizziness, sob. EKG did not show any peaked t-waves. - given 1 dose of kayexalate  - recheck serum K in AM   Chronic Anticoagulation Portal Vein Thrombosis: INR improved to 3.5 on 08/30. On coumadin at home. On admission, supratherapeutic at home with INR 5.2.  - Need to discuss risks/benefits of chronic anticoagulation  - consider outpatient evaluating of portal venous thrombosis  T2DM: Hgb A1C 6.9 in June 2018 - Continue home Lantus 5 units qhs - sensitive SSI  Hypertension: Chronic. Stable. 148-167/64-67 -Continue home Hydralazine, Lasix, and metoprolol (this is his regimen per PCP note in July)  Hyperlipidemia:  - Continue home statin  Hypothyroidism: TSH 3.4 in June 2018 - continue  home synthroid  Glaucoma:  - Continue home eye drops  Gout:  - Continue home    CKD III: Baseline Cr 1.5-2 over last year - BMP daily - Attempt to avoid nephrotoxic agents  Deaf:  -Needs sign language interpretor  ?Dementia: Family endorses patient cannot make decision for himself. Patient is alert and oriented x 3 on exam   FEN/GI: SLIV, clear liquid diet Prophylaxis: None due to supratherapeutic INR and ?GI bleed  Disposition: Home tomorrow  Subjective:  Feels well. No complaints of  palpitations, cp, dizziness, sob, fatigue, weakness.  Objective: Temp:  [97.6 F (36.4 C)-97.7 F (36.5 C)] 97.6 F (36.4 C) (08/30 0532) Pulse Rate:  [59-63] 61 (08/30 0532) Resp:  [16-17] 17 (08/30 0532) BP: (148-167)/(64-67) 148/64 (08/30 0532) SpO2:  [100 %] 100 % (08/30 0532) Weight:  [123 lb (55.8 kg)] 123 lb (55.8 kg) (08/29 2200)  Physical Exam: General: nad, alert in bed, requires sign language interpreter Cardiovascular: rrr, no mrg Respiratory: ctab, no increased work of breathing Abdomen: soft, nontender Extremities: no lower extremity edema, 5/5 strength throughout  Laboratory:  Recent Labs Lab 08/27/17 1630 08/27/17 1907 08/28/17 0344  WBC  --  5.6 3.9*  HGB 8.5* 8.0* 7.6*  HCT  --  26.8* 25.6*  PLT  --  162 132*    Recent Labs Lab 08/28/17 0344  NA 137  K 7.0*  CL 109  CO2 23  BUN 80*  CREATININE 2.07*  CALCIUM 9.5  GLUCOSE 199*    No results found.   Imaging/Diagnostic Tests: No results found.  Bonnita Hollow, MD 08/28/2017, 6:30 AM PGY-1, [place] Intern pager: [phone], text pages welcome

## 2017-08-28 NOTE — Plan of Care (Signed)
Problem: Safety: Goal: Ability to remain free from injury will improve Outcome: Progressing Bed in low position, non skid socks on, call light and belongings in reach, pt calls appropriately for assistance.

## 2017-08-28 NOTE — Care Management Obs Status (Signed)
[facility] NOTIFICATION   Patient Details  Name: Philip Matthews MRN: [id number] Date of Birth: 1928-02-11   Medicare Observation Status Notification Given:  Yes Pt is deaf, interp. Used to explain OBS status to pt and family. Pt and family indicated that they understood OBS status.    Gottlieb Zuercher, Rory Percy, RN 08/28/2017, 11:54 AM

## 2017-08-29 DIAGNOSIS — K921 Melena: Principal | ICD-10-CM

## 2017-08-29 LAB — BASIC METABOLIC PANEL
Anion gap: 6 (ref 5–15)
BUN: 71 mg/dL — AB (ref 6–20)
CALCIUM: 9 mg/dL (ref 8.9–10.3)
CO2: 24 mmol/L (ref 22–32)
Chloride: 108 mmol/L (ref 101–111)
Creatinine, Ser: 2.29 mg/dL — ABNORMAL HIGH (ref 0.61–1.24)
GFR calc Af Amer: 28 mL/min — ABNORMAL LOW (ref >60)
GFR, EST NON AFRICAN AMERICAN: 24 mL/min — AB (ref >60)
GLUCOSE: 100 mg/dL — AB (ref 65–99)
Potassium: 5.5 mmol/L — ABNORMAL HIGH (ref 3.5–5.1)
SODIUM: 138 mmol/L (ref 135–145)

## 2017-08-29 LAB — CBC
HCT: 25.4 % — ABNORMAL LOW (ref 39.0–52.0)
Hemoglobin: 7.6 g/dL — ABNORMAL LOW (ref 13.0–17.0)
MCH: 25.3 pg — ABNORMAL LOW (ref 26.0–34.0)
MCHC: 29.9 g/dL — AB (ref 30.0–36.0)
MCV: 84.7 fL (ref 78.0–100.0)
PLATELETS: 126 10*3/uL — AB (ref 150–400)
RBC: 3 MIL/uL — ABNORMAL LOW (ref 4.22–5.81)
RDW: 18.1 % — AB (ref 11.5–15.5)
WBC: 3.7 10*3/uL — ABNORMAL LOW (ref 4.0–10.5)

## 2017-08-29 LAB — GLUCOSE, CAPILLARY
Glucose-Capillary: 116 mg/dL — ABNORMAL HIGH (ref 65–99)
Glucose-Capillary: 277 mg/dL — ABNORMAL HIGH (ref 65–99)

## 2017-08-29 LAB — OCCULT BLOOD X 1 CARD TO LAB, STOOL: Fecal Occult Bld: NEGATIVE

## 2017-08-29 LAB — PROTIME-INR
INR: 2.91
PROTHROMBIN TIME: 30.2 s — AB (ref 11.4–15.2)

## 2017-08-29 MED ORDER — PANTOPRAZOLE SODIUM 40 MG PO TBEC: 40.0000 mg | DELAYED_RELEASE_TABLET | Freq: Two times a day (BID) | ORAL | 0 refills | Status: DC

## 2017-08-29 MED ORDER — BOOST / RESOURCE BREEZE PO LIQD: 1.0000 | Freq: Three times a day (TID) | ORAL | 0 refills | Status: AC

## 2017-08-29 NOTE — Care Management Note (Signed)
Case Management Note  Patient Details  Name: Philip Matthews MRN: [id number] Date of Birth: Jul 13, 1928  Subjective/Objective:     CM following for progression and d/c planning.                Action/Plan: Pt active with [facility] for [facility] services. No other needs identified. Orders to resume [facility] services requested by [facility] and entered.   Expected Discharge Date:  08/29/17               Expected Discharge Plan:  [place]  In-House Referral:  NA  Discharge planning Services  CM Consult  Post Acute Care Choice:  Home Health Choice offered to:  Patient  DME Arranged:  N/A DME Agency:  NA  HH Arranged:  RN [place] Agency:  [place]  Status of Service:  Completed, signed off  If discussed at [place] of Stay Meetings, dates discussed:    Additional Comments:  Adron Bene, RN 08/29/2017, 2:23 PM

## 2017-08-29 NOTE — Discharge Instructions (Signed)
You are admitted to the hospital for possible bleeding in your gastrointestinal tract. We have stopped your anticoagulation blood thinners (coumadin and aspirin) in case you are bleeding. We gave you IV iron and your hemoglobin level remained stable.   Reasons to return to the hospital:   You feel dizzy or you pass out (faint).  You feel weak.  You have very bad cramps in your back or belly (abdomen).  You pass large clumps of blood (clots) in your poop.  Your symptoms are getting worse.  Please follow up with Dr. Brett Albino in the clinic. It was a pleasure caring for you, take care!  Gastrointestinal Bleeding Gastrointestinal bleeding is bleeding somewhere along the path food travels through the body (digestive tract). This path is anywhere between the mouth and the opening of the butt (anus). You may have blood in your poop (stools) or have black poop. If you throw up (vomit), there may be blood in it. This condition can be mild, serious, or even life-threatening. If you have a lot of bleeding, you may need to stay in the hospital. Follow these instructions at home:  Take over-the-counter and prescription medicines only as told by your doctor.  Eat foods that have a lot of fiber in them. These foods include whole grains, fruits, and vegetables. You can also try eating 1-3 prunes each day.  Drink enough fluid to keep your pee (urine) clear or pale yellow.  Keep all follow-up visits as told by your doctor. This is important. Contact a doctor if:  Your symptoms do not get better. Get help right away if:  Your bleeding gets worse.  You feel dizzy or you pass out (faint).  You feel weak.  You have very bad cramps in your back or belly (abdomen).  You pass large clumps of blood (clots) in your poop.  Your symptoms are getting worse. This information is not intended to replace advice given to you by your health care provider. Make sure you discuss any questions you have with your  health care provider. Document Released: 09/24/2008 Document Revised: 05/23/2016 Document Reviewed: 06/05/2015 Elsevier Interactive Patient Education  2018 [employer].

## 2017-08-29 NOTE — Progress Notes (Signed)
FMTS Social Note:  Saw patient this morning socially. He is going to be discharged today and he is very happy about that. He is ready to go home. Anticoagulation has been stopped. He has no concerns. Looking forward to seeing him in clinic for his hospital follow-up appointment. Appreciate the care provided by Dr. Grandville Silos and the family medicine inpatient team.  Hyman Bible, MD

## 2017-08-29 NOTE — Progress Notes (Signed)
Family Medicine Teaching Service Daily Progress Note Intern Pager: [phone]  Patient name: Philip Matthews Medical record number: [id number] Date of birth: 07-17-1928 Age: 83 y.o. Gender: male  Primary Care Provider: Mayo, Pete Pelt, MD Consultants: None Code Status: Full  Pt Overview and Major Events to Date:  Philip Matthews is a 83 y.o. male presenting with dark stool, increased INR, and low hemoglobin. PMH is significant for chronic anticoagulation with warfarin for portal vein thrombosis, type 2 diabetes, HTN, HLD, Hypothyroidism, Glaucoma, gout, CKD III, Deaf  Assessment and Plan:  ?GI bleed with supratherapeutic INR and melena: VSS. INR improved to 2.91. Hb stable at 7.6.  - Admit to FPTS, med surg, attending Dr. Nori Riis - vitals per floor protocol - hold all anticoagulation for now - Transfusion threshold is hgb of 7, if he reaches that, will need to have discussion with patient and the family - protonix 40mg  bid - FOBT  Anemia: Stable. 7.6 on 08/31. Hgb 8 on admission. Was 9.6 to 11.1 last year in 2017. On iron supplementation. S/p 1 dose IV ferraheme - AM CBC - will give IV ferriheme   Hyperkalemia: Improved.  5.5 on 08/31. K 7.0 on 08/30. Unknown etiology. Pt has no complaints of palpitations, cp, dizziness, sob. EKG did not show any peaked t-waves. S/p  1 dose of kayexalate  - cont to monitor Cr - monitor cardiac status  Chronic Anticoagulation Portal Vein Thrombosis: INR now therapeutic. On coumadin at home. On admission, supratherapeutic at home with INR 5.2.  - dicussed w/ family risk/benefit of anticoagulation including the increased risk of bleeding associated w/ normal/supertherapeutic warfarin use (as in this current hospitalization) vs. The increased risk of clotting/stroke if not on anticoagulation. Discussed with pt and son-in-law at bedside w/ sign language interpreter. Daughter is currently hospitalized for an orthopedic procedure and was not able to come to  meeting today. Family and pt do not want to restart anticoagulation at this time due to increased bleeding risk. Plan to discuss further w/ outpatient doctor.  - consider outpatient evaluating of portal venous thrombosis with vascular/hematology   T2DM: Hgb A1C 6.9 in June 2018 - Continue home Lantus 5 units qhs - sensitive SSI  Hypertension: Chronic. Stable. 148-167/64-67 -Continue home Hydralazine, Lasix, and metoprolol (this is his regimen per PCP note in July)  Hyperlipidemia:  - Continue home statin  Hypothyroidism: TSH 3.4 in June 2018 - continue home synthroid  Glaucoma:  - Continue home eye drops  Gout:  - Continue home    CKD III: Baseline Cr 1.5-2 over last year - BMP daily - Attempt to avoid nephrotoxic agents  Deaf:  -Needs sign language interpretor  ?Dementia: Family endorses patient cannot make decision for himself. Patient is alert and oriented x 3 on exam   FEN/GI: SLIV, clear liquid diet Prophylaxis: None due to supratherapeutic INR and ?GI bleed  Disposition: Home   Subjective:  Feels well. No complaints of  palpitations, cp, dizziness, sob, fatigue, weakness.  Objective: Temp:  [97.7 F (36.5 C)-98.5 F (36.9 C)] 98.1 F (36.7 C) (08/31 0537) Pulse Rate:  [63-88] 69 (08/31 0537) Resp:  [16-18] 18 (08/31 0537) BP: (109-137)/(49-62) 129/59 (08/31 0537) SpO2:  [100 %] 100 % (08/31 0537) Weight:  [121 lb 14.6 oz (55.3 kg)] 121 lb 14.6 oz (55.3 kg) (08/30 2102)  Physical Exam: General: nad, alert in bed, requires sign language interpreter Cardiovascular: rrr, no mrg Respiratory: ctab, no increased work of breathing Abdomen: soft, nontender Extremities: no lower extremity  edema, 5/5 strength throughout  Laboratory:  Recent Labs Lab 08/27/17 1907 08/28/17 0344 08/29/17 0351  WBC 5.6 3.9* 3.7*  HGB 8.0* 7.6* 7.6*  HCT 26.8* 25.6* 25.4*  PLT 162 132* 126*    Recent Labs Lab 08/28/17 0344 08/28/17 2044 08/29/17 0351  NA 137  137 138  K 7.0* 5.5* 5.5*  CL 109 106 108  CO2 23 23 24   BUN 80* 68* 71*  CREATININE 2.07* 2.15* 2.29*  CALCIUM 9.5 9.6 9.0  GLUCOSE 199* 75 100*    No results found.   Imaging/Diagnostic Tests: No results found.  Bonnita Hollow, MD 08/29/2017, 5:54 AM PGY-1, [place] Intern pager: [phone], text pages welcome

## 2017-08-29 NOTE — Progress Notes (Signed)
Discharge teaching complete with the use of an interpreter and patient's son I law. Meds, diet, activity, follow up appointment and symptoms reviewed and all questions answered. Copy of instructions and prescription given to patient. Patient discharged via wheelchair with NT and family.

## 2017-09-02 ENCOUNTER — Other Ambulatory Visit: Payer: Medicare Other

## 2017-09-05 ENCOUNTER — Telehealth: Payer: Self-pay | Admitting: *Deleted

## 2017-09-05 NOTE — Telephone Encounter (Signed)
Philip Matthews at [facility] calling to obtain verbal order to recertify patient for 60 days for continuous monitoring to fill pill box, monitor nephrology labs, and check INR if/when coumadin is restarted.  Will route request to PCP and call [facility] back.  Burna Forts, BSN, RN-BC

## 2017-09-05 NOTE — Telephone Encounter (Signed)
Okay to give verbal order. Thanks!

## 2017-09-08 ENCOUNTER — Encounter: Payer: Self-pay | Admitting: Internal Medicine

## 2017-09-09 ENCOUNTER — Encounter: Payer: Self-pay | Admitting: Internal Medicine

## 2017-09-09 ENCOUNTER — Ambulatory Visit (INDEPENDENT_AMBULATORY_CARE_PROVIDER_SITE_OTHER): Payer: Medicare Other | Admitting: Internal Medicine

## 2017-09-09 VITALS — BP 120/60 | HR 76 | Temp 98.1°F | Wt 119.0 lb

## 2017-09-09 DIAGNOSIS — K921 Melena: Secondary | ICD-10-CM

## 2017-09-09 DIAGNOSIS — Z7901 Long term (current) use of anticoagulants: Secondary | ICD-10-CM

## 2017-09-09 DIAGNOSIS — E1143 Type 2 diabetes mellitus with diabetic autonomic (poly)neuropathy: Secondary | ICD-10-CM | POA: Diagnosis present

## 2017-09-09 DIAGNOSIS — E875 Hyperkalemia: Secondary | ICD-10-CM | POA: Diagnosis not present

## 2017-09-09 LAB — POCT GLYCOSYLATED HEMOGLOBIN (HGB A1C): HEMOGLOBIN A1C: 6.8

## 2017-09-09 LAB — POCT INR: INR: 1

## 2017-09-09 LAB — POCT HEMOGLOBIN: HEMOGLOBIN: 9 g/dL — AB (ref 14.1–18.1)

## 2017-09-09 NOTE — Assessment & Plan Note (Signed)
Unclear etiology, but may be related to worsening renal function. - Recheck BMP - Advised patient to follow-up with nephrologist

## 2017-09-09 NOTE — Patient Instructions (Signed)
It was wonderful to see you today!  I will send a copy of your lab results to your house. I will also talk to the blood doctor about the Coumadin.  We will see you back in 3 months or earlier if needed.  -Dr. Brett Albino

## 2017-09-09 NOTE — Assessment & Plan Note (Signed)
Resolved after stopping Coumadin. No bleeding from other sites. - Repeat POC Hgb was 9.0 today - Will discuss risks/benefits of continuing anticoagulation with hematologist.

## 2017-09-09 NOTE — Progress Notes (Signed)
   [facility] Phone: [phone]  Subjective:  Philip Matthews is an 83 year old male presenting to clinic for a hospital follow-up appointment. Sign language interpretor used throughout appointment.  Melena: He was hospitalized from 8/29-8/31 for melena in the setting of a suprapubic INR of 5.2. Hgb was 8.0 on admission and remained stable throughout admission. He received Feraheme x 1 in the hospital. Warfarin was discontinued. Patient was previously on long term anticoagulation due to a portal vein thrombosis, which occurred 6-7 years ago. He was told that he would need life-long anticoagulation. Since being discharged from the hospital, he has not had any melena or hematochezia. No bleeding gums. No hematuria. He has been feeling well.  Hyperkalemia: Patient noted to be hyperkalemic to 7.0 while in the hospital. Etiology was unknown. He was given Kayexalate x 1 and his K improved to 5.5. No muscle pain, no fatigue, no palpitations.  ROS: See HPI for pertinent positives and negatives  Past Medical History- HTN, long term anticoagulation due to hx of portal vein thrombosis, Barrett's esophagus, T2DM, bilateral deafness, anemia of chronic disease, CKD III, HLD, gout, hypothyroidism  Family history reviewed for today's visit. No changes.  Social history- patient is a former smoker. Quit in 2012.  Objective: BP 120/60   Pulse 76   Temp 98.1 F (36.7 C) (Oral)   Wt 119 lb (54 kg)   SpO2 98%   BMI 20.43 kg/m  Gen: NAD, alert, cooperative with exam HEENT: NCAT, EOMI, MMM Neck: FROM, supple CV: RRR, no murmur Resp: CTABL, no wheezes, normal work of breathing Msk: No edema, warm, normal tone, moves UE/LE spontaneously Neuro: Alert and oriented, no gross deficits Skin: No rashes, no lesions Psych: Appropriate behavior  Assessment/Plan: Melena: Resolved after stopping Coumadin. No bleeding from other sites. - Repeat POC Hgb was 9.0 today - Will discuss  risks/benefits of continuing anticoagulation with hematologist.  Hyperkalemia: Unclear etiology, but may be related to worsening renal function. - Recheck BMP - Advised patient to follow-up with nephrologist  Follow-up in 3 months.   Hyman Bible, MD PGY-3

## 2017-09-10 ENCOUNTER — Other Ambulatory Visit: Payer: Self-pay | Admitting: Student in an Organized Health Care Education/Training Program

## 2017-09-10 ENCOUNTER — Other Ambulatory Visit: Payer: Self-pay | Admitting: Internal Medicine

## 2017-09-10 DIAGNOSIS — D649 Anemia, unspecified: Secondary | ICD-10-CM | POA: Diagnosis not present

## 2017-09-10 DIAGNOSIS — E038 Other specified hypothyroidism: Secondary | ICD-10-CM

## 2017-09-10 DIAGNOSIS — N183 Chronic kidney disease, stage 3 (moderate): Secondary | ICD-10-CM | POA: Diagnosis not present

## 2017-09-10 DIAGNOSIS — E1143 Type 2 diabetes mellitus with diabetic autonomic (poly)neuropathy: Secondary | ICD-10-CM

## 2017-09-10 DIAGNOSIS — Z5181 Encounter for therapeutic drug level monitoring: Secondary | ICD-10-CM | POA: Diagnosis not present

## 2017-09-10 DIAGNOSIS — K929 Disease of digestive system, unspecified: Secondary | ICD-10-CM | POA: Diagnosis not present

## 2017-09-10 LAB — BASIC METABOLIC PANEL
BUN/Creatinine Ratio: 39 — ABNORMAL HIGH (ref 10–24)
BUN: 69 mg/dL — ABNORMAL HIGH (ref 8–27)
CHLORIDE: 105 mmol/L (ref 96–106)
CO2: 22 mmol/L (ref 20–29)
CREATININE: 1.76 mg/dL — AB (ref 0.76–1.27)
Calcium: 9.1 mg/dL (ref 8.6–10.2)
GFR calc Af Amer: 40 mL/min/{1.73_m2} — ABNORMAL LOW (ref >59)
GFR calc non Af Amer: 34 mL/min/{1.73_m2} — ABNORMAL LOW (ref >59)
GLUCOSE: 157 mg/dL — AB (ref 65–99)
Potassium: 4.9 mmol/L (ref 3.5–5.2)
SODIUM: 142 mmol/L (ref 134–144)

## 2017-09-11 MED ORDER — INSULIN GLARGINE 100 UNITS/ML SOLOSTAR PEN: 4.0000 [IU] | PEN_INJECTOR | Freq: Every day | SUBCUTANEOUS | 5 refills | Status: DC

## 2017-09-15 ENCOUNTER — Encounter: Payer: Self-pay | Admitting: Internal Medicine

## 2017-09-24 ENCOUNTER — Other Ambulatory Visit: Payer: Self-pay | Admitting: Internal Medicine

## 2017-09-24 ENCOUNTER — Other Ambulatory Visit: Payer: Self-pay | Admitting: *Deleted

## 2017-09-24 DIAGNOSIS — Z5181 Encounter for therapeutic drug level monitoring: Secondary | ICD-10-CM | POA: Diagnosis not present

## 2017-09-24 DIAGNOSIS — E1143 Type 2 diabetes mellitus with diabetic autonomic (poly)neuropathy: Secondary | ICD-10-CM

## 2017-09-24 DIAGNOSIS — N183 Chronic kidney disease, stage 3 (moderate): Secondary | ICD-10-CM | POA: Diagnosis not present

## 2017-09-24 DIAGNOSIS — D649 Anemia, unspecified: Secondary | ICD-10-CM | POA: Diagnosis not present

## 2017-09-24 MED ORDER — INSULIN GLARGINE 100 UNITS/ML SOLOSTAR PEN: 4.0000 [IU] | PEN_INJECTOR | Freq: Every day | SUBCUTANEOUS | 5 refills | Status: DC

## 2017-09-24 NOTE — Telephone Encounter (Signed)
Patient is almost of Lantus.  Derl Barrow, RN

## 2017-09-24 NOTE — Telephone Encounter (Signed)
Can we call this refill into the patient's pharmacy? For some reason, it will not let me e-prescribe it. The prescription is Lantus 100 unit/ml SOPN, quantity: 37ml, refills: 5; sig: Inject 0.87ml (4 units) into the skin daily before breakfast.  Thank you!

## 2017-09-25 NOTE — Telephone Encounter (Signed)
Tried to call pharmacy but was unable to get through.  Will check with home health tomorrow to see if we can call this in to a local pharmacy so patient can get his medications quickly. Bailley Guilford,CMA

## 2017-09-26 MED ORDER — INSULIN GLARGINE 100 UNITS/ML SOLOSTAR PEN: 4.0000 [IU] | PEN_INJECTOR | Freq: Every day | SUBCUTANEOUS | 5 refills | Status: DC

## 2017-09-26 NOTE — Telephone Encounter (Signed)
Spoke with Amy at [facility] and states that patient is using [facility] on [address] for his pharmacy.  Script wouldn't go electronically but it printed and will be faxed to them today. Alainah Phang,CMA

## 2017-10-08 DIAGNOSIS — D649 Anemia, unspecified: Secondary | ICD-10-CM | POA: Diagnosis not present

## 2017-10-08 DIAGNOSIS — Z5181 Encounter for therapeutic drug level monitoring: Secondary | ICD-10-CM | POA: Diagnosis not present

## 2017-10-08 DIAGNOSIS — N183 Chronic kidney disease, stage 3 (moderate): Secondary | ICD-10-CM | POA: Diagnosis not present

## 2017-10-21 ENCOUNTER — Encounter: Payer: Self-pay | Admitting: Internal Medicine

## 2017-10-22 ENCOUNTER — Encounter: Payer: Self-pay | Admitting: Internal Medicine

## 2017-10-22 DIAGNOSIS — N183 Chronic kidney disease, stage 3 (moderate): Secondary | ICD-10-CM | POA: Diagnosis not present

## 2017-10-22 DIAGNOSIS — Z5181 Encounter for therapeutic drug level monitoring: Secondary | ICD-10-CM | POA: Diagnosis not present

## 2017-10-22 DIAGNOSIS — D649 Anemia, unspecified: Secondary | ICD-10-CM | POA: Diagnosis not present

## 2017-10-23 ENCOUNTER — Encounter: Payer: Self-pay | Admitting: Internal Medicine

## 2017-10-23 ENCOUNTER — Ambulatory Visit (INDEPENDENT_AMBULATORY_CARE_PROVIDER_SITE_OTHER): Payer: Medicare Other | Admitting: Internal Medicine

## 2017-10-23 DIAGNOSIS — Z23 Encounter for immunization: Secondary | ICD-10-CM

## 2017-10-23 DIAGNOSIS — Z7901 Long term (current) use of anticoagulants: Secondary | ICD-10-CM

## 2017-10-23 NOTE — Progress Notes (Signed)
   [facility] Phone: [phone]  Subjective:  Philip Matthews is an 83 year old male presenting to clinic to discuss his need for chronic anticoagulation. He has been on Coumadin for 6-7 years due to a portal vein thrombosis. Per chart review, he saw Dr. Alen Blew at that time and was told he would need life long anticoagulation. He has generally done well on Coumadin until this year when he was hospitalized from 8/29-8/31 with melena in the setting of a suprapubic INR. Hgb was 8.0 at the time. He was treated with Feraheme x 1 and his Coumadin was stopped. He has not restarted the Coumadin. He has not had any additional episodes of melena. He denies any hematochezia, hematuria, or bleeding gums.   ROS: See HPI for pertinent positives and negatives  Past Medical History- HTN, hx portal vein thrombosis, Barrett's esophagus, T2DM, bilateral deafness, anemia of chronic disease, CKD III, HLD, gout, hypothyroidism.  Family history reviewed for today's visit. No changes.  Social history- patient is a former smoker, quit in 2012.  Objective: BP 128/62   Pulse (!) 56   Temp 97.6 F (36.4 C) (Oral)   Ht 5\' 4"  (1.626 m)   Wt 122 lb (55.3 kg)   SpO2 99%   BMI 20.94 kg/m  Gen: NAD, alert, cooperative with exam HEENT: NCAT, EOMI, MMM CV: RRR, no murmur Resp: CTABL, no wheezes, normal work of breathing GI: SNTND, BS present, no guarding or organomegaly  Assessment/Plan: Chronic Anticoagulation: Discussed in detail the risks and benefits of continuing long term anticoagulation. We discussed that the patient may choose to remain off anticoagulation given his age and that he has had an episode of bleeding that has lead to an admission. Remaining off anticoagulation would also allow him more freedom, as he would not have to have so many INR checks or worry about what he is eating. We also discussed the risks of developing another blood clot in the future. Patient would like to discuss this  more with his daughter and they will let me know their decision. I also offered to send in a referral to Dr. Alen Blew so that they can discuss this further with him. Will hold off on referral for now until family has time to discuss it.   Hyman Bible, MD PGY-3

## 2017-10-23 NOTE — Patient Instructions (Signed)
It was wonderful to see you today!  We discussed your blood thinner (Coumadin/Warfarin) in detail. You are on the blood thinner because you had a blood clot in the vein near your liver about 8-10 years ago. At that time, you saw Dr. Alen Blew (who is a blood doctor), who recommended that you stay on the blood thinner for the rest of your life to prevent future blood clots from happening. The blood clot in the vein near your liver has gone away at this point. Your blood thinner was stopped this year because you had bleeding in your stool. If you would like, I can send in a referral for you to see Dr. Alen Blew to discuss if you still need the blood thinner.  You are 83 years old and being on the blood thinner is a lot of work because it requires a lot of close monitoring. It also puts you at risk of bleeding in your stool, which may mean that you end up in the hospital more frequently than if you were not on a blood thinner. You also have the option to decide that you no longer want to be on the blood thinner. If you do this, you will need to accept the risk that you may develop a blood clot in the future (nobody knows if this will happen or not). The benefit is that you will have less blood draws, more freedom, and it is less likely that you will end up in the hospital with bleeding in your stool. I think it would be reasonable to decide to not be on the blood thinner anymore at your age.  Please discuss this as a family and let me know if you would like a referral to Dr. Alen Blew.  -Dr. Brett Albino

## 2017-10-23 NOTE — Assessment & Plan Note (Signed)
Discussed in detail the risks and benefits of continuing long term anticoagulation. We discussed that the patient may choose to remain off anticoagulation given his age and that he has had an episode of bleeding that has lead to an admission. Remaining off anticoagulation would also allow him more freedom, as he would not have to have so many INR checks or worry about what he is eating. We also discussed the risks of developing another blood clot in the future. Patient would like to discuss this more with his daughter and they will let me know their decision. I also offered to send in a referral to Dr. Alen Blew so that they can discuss this further with him. Will hold off on referral for now until family has time to discuss it.

## 2017-10-24 DIAGNOSIS — Z23 Encounter for immunization: Secondary | ICD-10-CM | POA: Diagnosis not present

## 2017-11-05 ENCOUNTER — Telehealth: Payer: Self-pay | Admitting: *Deleted

## 2017-11-05 ENCOUNTER — Other Ambulatory Visit: Payer: Self-pay | Admitting: Internal Medicine

## 2017-11-05 DIAGNOSIS — D649 Anemia, unspecified: Secondary | ICD-10-CM | POA: Diagnosis not present

## 2017-11-05 DIAGNOSIS — Z5181 Encounter for therapeutic drug level monitoring: Secondary | ICD-10-CM | POA: Diagnosis not present

## 2017-11-05 DIAGNOSIS — N183 Chronic kidney disease, stage 3 (moderate): Secondary | ICD-10-CM | POA: Diagnosis not present

## 2017-11-05 NOTE — Telephone Encounter (Signed)
Amy, RN at [facility] calling to request verbal order to continue nursing services for 60 days.  Patient receives lab draws, P injection, and med box refills.  Will route request to Dr. Brett Albino and call nurse back.  Burna Forts, BSN, RN-BC

## 2017-11-06 NOTE — Telephone Encounter (Signed)
Okay to give verbal order.  Thank you

## 2017-11-06 NOTE — Telephone Encounter (Signed)
Verbal orders given to Amy.  Orel Cooler,CMA  

## 2017-11-13 DIAGNOSIS — H401113 Primary open-angle glaucoma, right eye, severe stage: Secondary | ICD-10-CM | POA: Diagnosis not present

## 2017-11-13 DIAGNOSIS — H401121 Primary open-angle glaucoma, left eye, mild stage: Secondary | ICD-10-CM | POA: Diagnosis not present

## 2017-11-19 ENCOUNTER — Other Ambulatory Visit: Payer: Self-pay | Admitting: Internal Medicine

## 2017-11-19 DIAGNOSIS — Z5181 Encounter for therapeutic drug level monitoring: Secondary | ICD-10-CM | POA: Diagnosis not present

## 2017-11-19 DIAGNOSIS — N183 Chronic kidney disease, stage 3 (moderate): Secondary | ICD-10-CM | POA: Diagnosis not present

## 2017-11-19 DIAGNOSIS — D649 Anemia, unspecified: Secondary | ICD-10-CM | POA: Diagnosis not present

## 2017-12-03 ENCOUNTER — Other Ambulatory Visit: Payer: Self-pay | Admitting: Internal Medicine

## 2017-12-03 DIAGNOSIS — D649 Anemia, unspecified: Secondary | ICD-10-CM | POA: Diagnosis not present

## 2017-12-03 DIAGNOSIS — Z5181 Encounter for therapeutic drug level monitoring: Secondary | ICD-10-CM | POA: Diagnosis not present

## 2017-12-03 DIAGNOSIS — N183 Chronic kidney disease, stage 3 (moderate): Secondary | ICD-10-CM | POA: Diagnosis not present

## 2017-12-08 ENCOUNTER — Ambulatory Visit: Payer: Medicare Other | Admitting: Podiatry

## 2017-12-17 DIAGNOSIS — N183 Chronic kidney disease, stage 3 (moderate): Secondary | ICD-10-CM | POA: Diagnosis not present

## 2017-12-17 DIAGNOSIS — D649 Anemia, unspecified: Secondary | ICD-10-CM | POA: Diagnosis not present

## 2017-12-17 DIAGNOSIS — Z5181 Encounter for therapeutic drug level monitoring: Secondary | ICD-10-CM | POA: Diagnosis not present

## 2017-12-31 ENCOUNTER — Other Ambulatory Visit: Payer: Self-pay | Admitting: Internal Medicine

## 2017-12-31 DIAGNOSIS — N183 Chronic kidney disease, stage 3 (moderate): Secondary | ICD-10-CM | POA: Diagnosis not present

## 2017-12-31 DIAGNOSIS — D649 Anemia, unspecified: Secondary | ICD-10-CM | POA: Diagnosis not present

## 2017-12-31 DIAGNOSIS — Z5181 Encounter for therapeutic drug level monitoring: Secondary | ICD-10-CM | POA: Diagnosis not present

## 2018-01-02 ENCOUNTER — Other Ambulatory Visit: Payer: Self-pay | Admitting: Internal Medicine

## 2018-01-02 LAB — IRON AND TIBC
BASOS ABS: 0
Basophil: 0
Eosinophil: 4
Eosinophils Absolute: 0
Ferritin: 138
HEMATOCRIT: 32 — AB
Hemoglobin: 9.7 — ABNORMAL LOW
IRON SATURATION: 23
IRON: 53
Lymphocytes Absolute: 1 /uL
Lymphocytes: 22
MCH: 26 — ABNORMAL LOW
MCHC: 30.6 — AB
MCV: 85
Monocytes Absolute: 0
Monocytes: 8
NEUTROS PCT: 66
Neutrophils Absolute: 3 /uL
PLATELETS: 117 — AB
RBC: 3.73 — AB
RDW: 17.1 — AB
TIBC: 226 — ABNORMAL LOW
UIBC: 173
WBC: 4.9

## 2018-01-14 ENCOUNTER — Other Ambulatory Visit: Payer: Self-pay | Admitting: Internal Medicine

## 2018-01-14 DIAGNOSIS — M109 Gout, unspecified: Secondary | ICD-10-CM

## 2018-01-14 DIAGNOSIS — Z1839 Other retained organic fragments: Secondary | ICD-10-CM | POA: Diagnosis not present

## 2018-01-14 DIAGNOSIS — D649 Anemia, unspecified: Secondary | ICD-10-CM | POA: Diagnosis not present

## 2018-01-14 DIAGNOSIS — Z5181 Encounter for therapeutic drug level monitoring: Secondary | ICD-10-CM | POA: Diagnosis not present

## 2018-01-14 IMAGING — CR DG ANKLE COMPLETE 3+V*R*
3 series · 3 of 3 positions shown · non-contrast
Comparison: None.

CLINICAL DATA: RIGHT ankle pain.

EXAM:
RIGHT ANKLE - COMPLETE 3+ VIEW

[x ankle ap right]
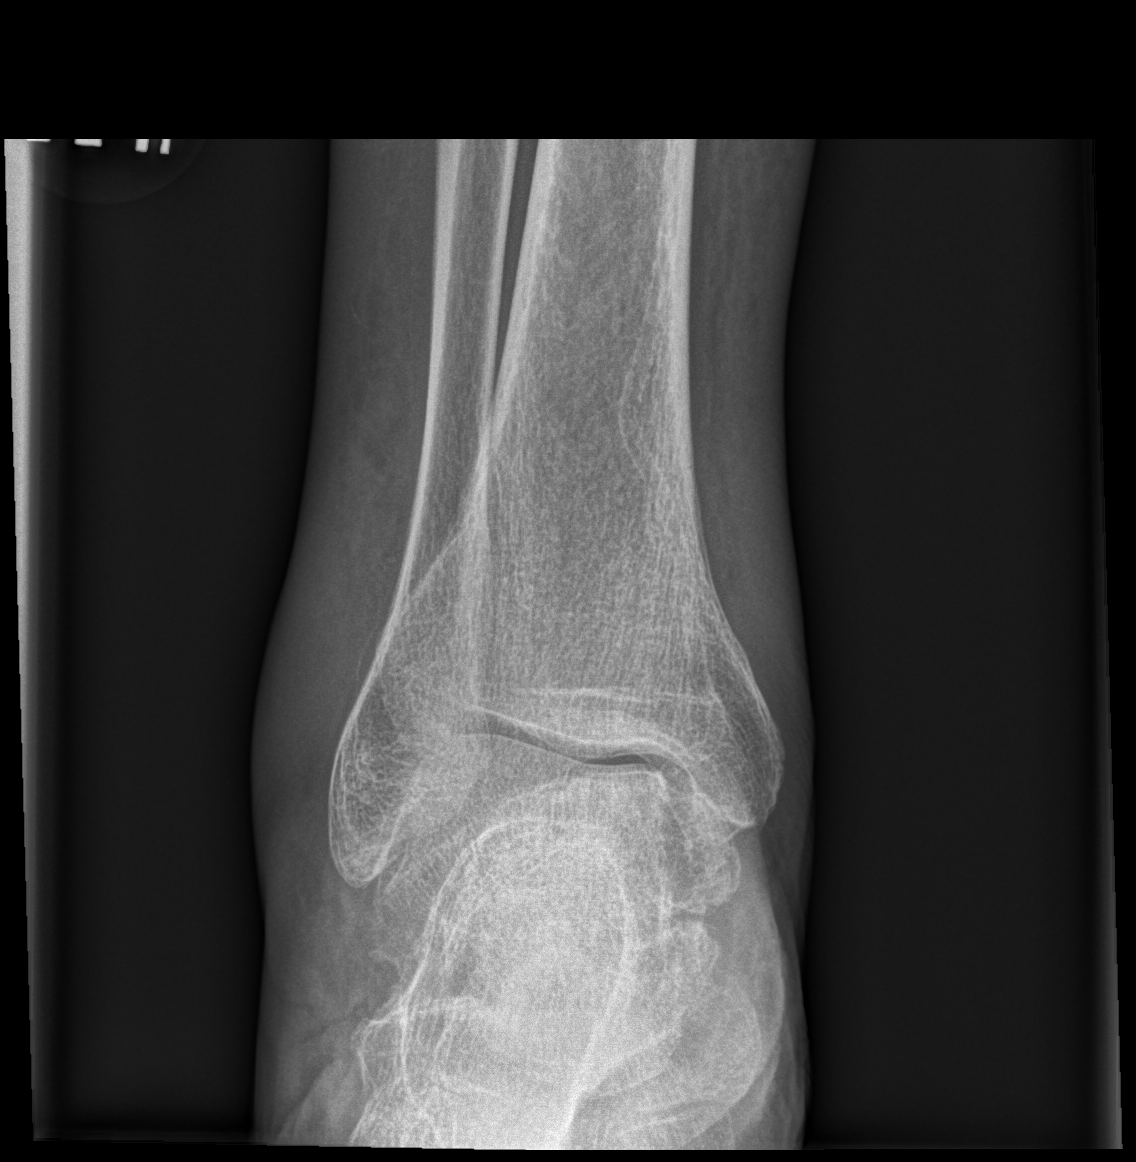

[x ankle lat right (1 of 2)]
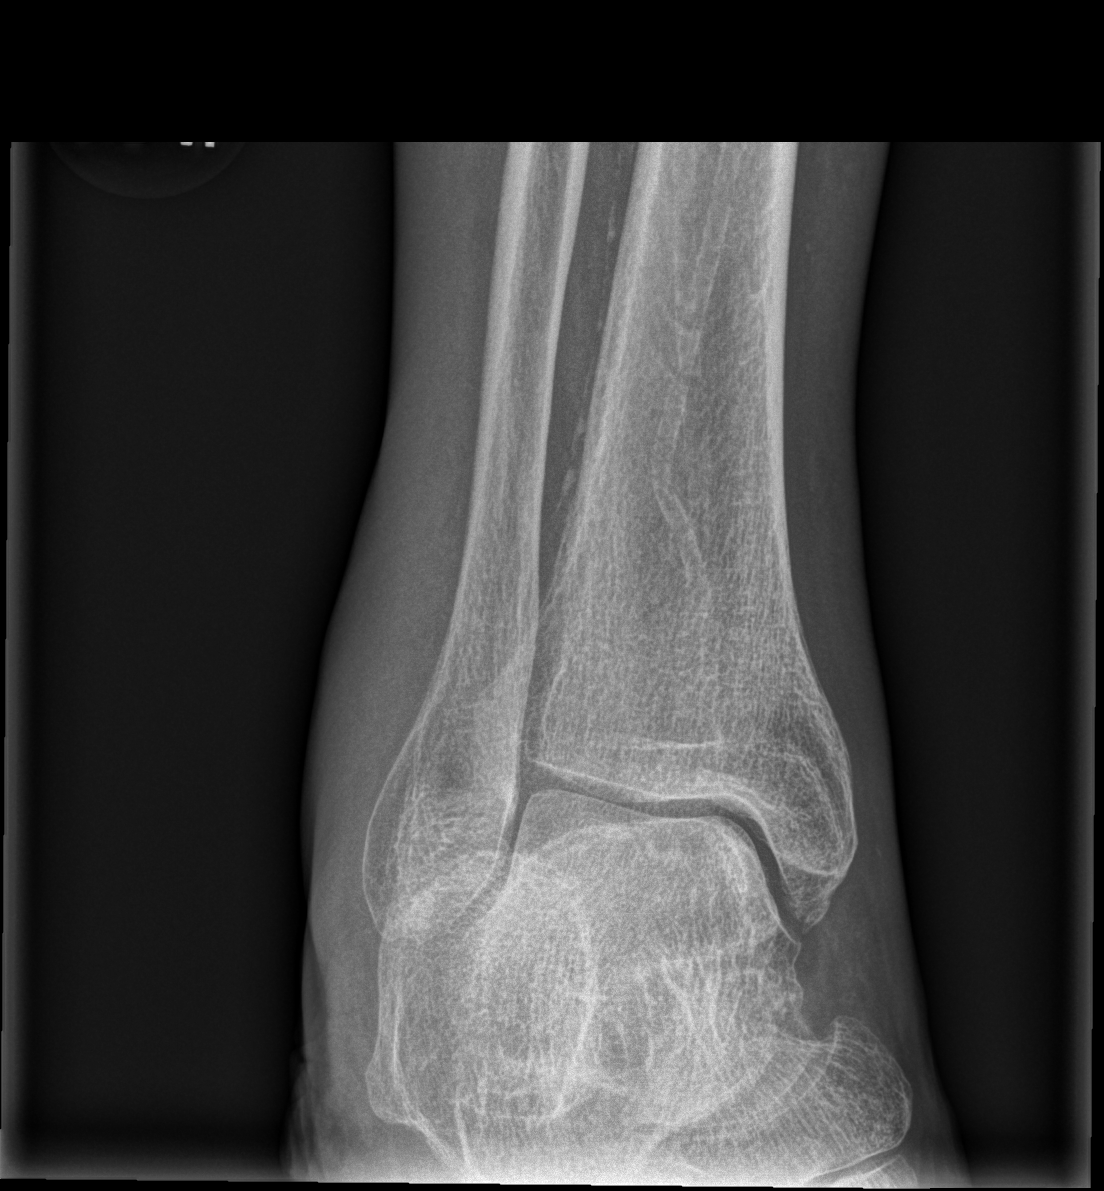

[x ankle lat right (2 of 2)]
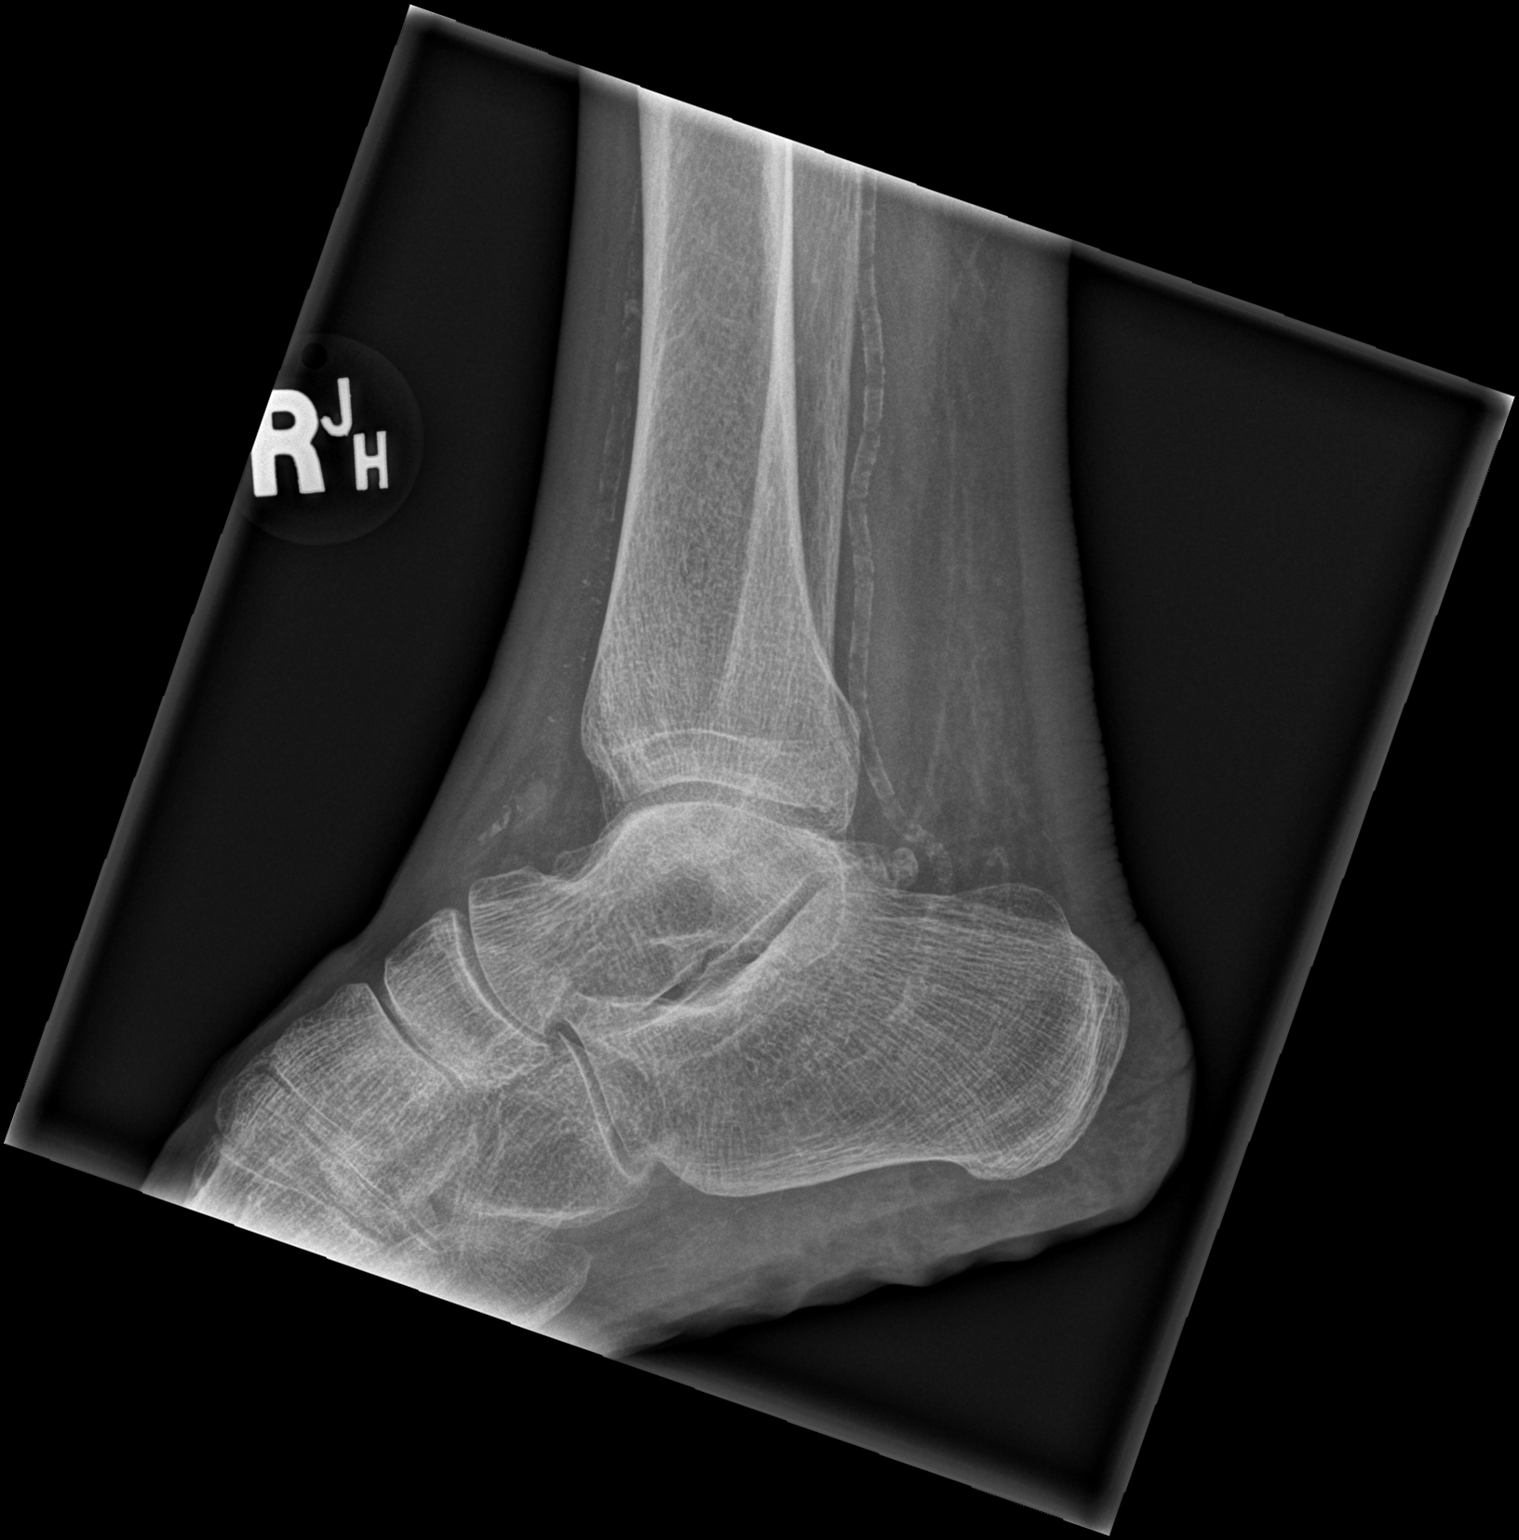

[3 of 3 positions shown; findings below may reference images not displayed]

FINDINGS: Ankle mortise intact. The talar dome is normal. No malleolar
fracture. The calcaneus is normal. There is mild soft tissue
swelling of the lateral malleolus. Atherosclerotic calcification
noted.
IMPRESSION: 1.  No acute osseous abnormality.
2. Mild soft tissue swelling.

## 2018-01-27 DIAGNOSIS — Z5181 Encounter for therapeutic drug level monitoring: Secondary | ICD-10-CM | POA: Diagnosis not present

## 2018-01-27 DIAGNOSIS — D649 Anemia, unspecified: Secondary | ICD-10-CM | POA: Diagnosis not present

## 2018-01-27 DIAGNOSIS — N183 Chronic kidney disease, stage 3 (moderate): Secondary | ICD-10-CM | POA: Diagnosis not present

## 2018-01-29 DIAGNOSIS — N2581 Secondary hyperparathyroidism of renal origin: Secondary | ICD-10-CM | POA: Diagnosis not present

## 2018-01-29 DIAGNOSIS — N184 Chronic kidney disease, stage 4 (severe): Secondary | ICD-10-CM | POA: Diagnosis not present

## 2018-01-30 ENCOUNTER — Encounter: Payer: Self-pay | Admitting: Internal Medicine

## 2018-01-30 DIAGNOSIS — D631 Anemia in chronic kidney disease: Secondary | ICD-10-CM | POA: Diagnosis not present

## 2018-01-30 DIAGNOSIS — N183 Chronic kidney disease, stage 3 (moderate): Secondary | ICD-10-CM | POA: Diagnosis not present

## 2018-01-30 DIAGNOSIS — I129 Hypertensive chronic kidney disease with stage 1 through stage 4 chronic kidney disease, or unspecified chronic kidney disease: Secondary | ICD-10-CM | POA: Diagnosis not present

## 2018-01-30 DIAGNOSIS — N2581 Secondary hyperparathyroidism of renal origin: Secondary | ICD-10-CM | POA: Diagnosis not present

## 2018-02-11 ENCOUNTER — Other Ambulatory Visit: Payer: Self-pay | Admitting: Internal Medicine

## 2018-02-11 DIAGNOSIS — Z5181 Encounter for therapeutic drug level monitoring: Secondary | ICD-10-CM | POA: Diagnosis not present

## 2018-02-11 DIAGNOSIS — D649 Anemia, unspecified: Secondary | ICD-10-CM | POA: Diagnosis not present

## 2018-02-11 DIAGNOSIS — D631 Anemia in chronic kidney disease: Secondary | ICD-10-CM | POA: Diagnosis not present

## 2018-02-25 DIAGNOSIS — N183 Chronic kidney disease, stage 3 (moderate): Secondary | ICD-10-CM | POA: Diagnosis not present

## 2018-02-25 DIAGNOSIS — D649 Anemia, unspecified: Secondary | ICD-10-CM | POA: Diagnosis not present

## 2018-02-25 DIAGNOSIS — Z5181 Encounter for therapeutic drug level monitoring: Secondary | ICD-10-CM | POA: Diagnosis not present

## 2018-03-02 ENCOUNTER — Ambulatory Visit (INDEPENDENT_AMBULATORY_CARE_PROVIDER_SITE_OTHER): Payer: Medicare Other | Admitting: Podiatry

## 2018-03-02 DIAGNOSIS — M79609 Pain in unspecified limb: Secondary | ICD-10-CM | POA: Diagnosis not present

## 2018-03-02 DIAGNOSIS — E0842 Diabetes mellitus due to underlying condition with diabetic polyneuropathy: Secondary | ICD-10-CM

## 2018-03-02 DIAGNOSIS — B351 Tinea unguium: Secondary | ICD-10-CM

## 2018-03-04 NOTE — Progress Notes (Signed)
   SUBJECTIVE Patient with a history of diabetes mellitus presents to office today complaining of elongated, thickened nails. Pain while ambulating in shoes. Patient is unable to trim their own nails.   Past Medical History:  Diagnosis Date  . Anemia   . Arthritis    "joints" (08/27/2017)  . ARTHRITIS, LEFT SHOULDER 02/01/2010   Qualifier: Diagnosis of  By: Jacques Earthly    . Chronic kidney disease (CKD), stage IV (severe) ([place])   . Deaf   . Diverticulosis   . DIVERTICULOSIS OF COLON 03/08/2010   Qualifier: Diagnosis of  By: Jacques Earthly    . Esophagitis    HSV  . Gout   . History of DVT (deep vein thrombosis)   . Hyperlipidemia   . Hypertension   . Hypothyroidism   . Kidney mass    lipoma  . Liver nodule    u/s 310  . Portal vein thrombosis    on life long coumadin  . PUD, HX OF 03/08/2010   Qualifier: History of  By: Jacques Earthly    . Type II diabetes mellitus ([place])     OBJECTIVE General Patient is awake, alert, and oriented x 3 and in no acute distress. Derm Skin is dry and supple bilateral. Negative open lesions or macerations. Remaining integument unremarkable. Nails are tender, long, thickened and dystrophic with subungual debris, consistent with onychomycosis, 1-5 bilateral. No signs of infection noted. Vasc  DP and PT pedal pulses palpable bilaterally. Temperature gradient within normal limits.  Neuro Epicritic and protective threshold sensation diminished bilaterally.  Musculoskeletal Exam No symptomatic pedal deformities noted bilateral. Muscular strength within normal limits.  ASSESSMENT 1. Diabetes Mellitus w/ peripheral neuropathy 2. Onychomycosis of nail due to dermatophyte bilateral 3. Pain in foot bilateral  PLAN OF CARE 1. Patient evaluated today. 2. Instructed to maintain good pedal hygiene and foot care. Stressed importance of controlling blood sugar.  3. Mechanical debridement of nails 1-5 bilaterally performed using a nail nipper. Filed  with dremel without incident.  4. Return to clinic in 3 mos.     Edrick Kins, DPM [facility]  Dr. Edrick Kins, [address]                                        [place], [place] [address]                Office [phone]  Fax [phone]

## 2018-03-11 ENCOUNTER — Other Ambulatory Visit: Payer: Self-pay | Admitting: Internal Medicine

## 2018-03-11 DIAGNOSIS — D649 Anemia, unspecified: Secondary | ICD-10-CM | POA: Diagnosis not present

## 2018-03-11 DIAGNOSIS — N183 Chronic kidney disease, stage 3 (moderate): Secondary | ICD-10-CM | POA: Diagnosis not present

## 2018-03-11 DIAGNOSIS — Z5181 Encounter for therapeutic drug level monitoring: Secondary | ICD-10-CM | POA: Diagnosis not present

## 2018-03-13 ENCOUNTER — Other Ambulatory Visit: Payer: Self-pay | Admitting: Internal Medicine

## 2018-03-13 DIAGNOSIS — E1143 Type 2 diabetes mellitus with diabetic autonomic (poly)neuropathy: Secondary | ICD-10-CM

## 2018-03-13 MED ORDER — INSULIN PEN NEEDLE 32G X 6 MM MISC
2 refills | Status: DC
Start: 2018-03-13 — End: 2018-03-23

## 2018-03-13 MED ORDER — INSULIN LISPRO 100 UNIT/ML (KWIKPEN): 3.0000 [IU] | PEN_INJECTOR | Freq: Three times a day (TID) | SUBCUTANEOUS | 2 refills | Status: DC

## 2018-03-13 NOTE — Telephone Encounter (Signed)
Pt needs refill on the needles and humalog.  [facility].  Please leave message if you call back.  He has been out of it for 4 days

## 2018-03-20 ENCOUNTER — Observation Stay (HOSPITAL_COMMUNITY): Payer: Medicare Other

## 2018-03-20 ENCOUNTER — Other Ambulatory Visit: Payer: Self-pay

## 2018-03-20 ENCOUNTER — Emergency Department (HOSPITAL_COMMUNITY): Payer: Medicare Other

## 2018-03-20 ENCOUNTER — Observation Stay (HOSPITAL_COMMUNITY)
Admission: EM | Admit: 2018-03-20 | Discharge: 2018-03-23 | Disposition: A | Discharge status: Home / Self Care | Payer: Medicare Other | Attending: Family Medicine | Admitting: Family Medicine

## 2018-03-20 ENCOUNTER — Encounter (HOSPITAL_COMMUNITY): Payer: Self-pay | Admitting: Emergency Medicine

## 2018-03-20 ENCOUNTER — Observation Stay (HOSPITAL_BASED_OUTPATIENT_CLINIC_OR_DEPARTMENT_OTHER): Payer: Medicare Other

## 2018-03-20 DIAGNOSIS — E039 Hypothyroidism, unspecified: Secondary | ICD-10-CM | POA: Diagnosis not present

## 2018-03-20 DIAGNOSIS — Z79890 Hormone replacement therapy (postmenopausal): Secondary | ICD-10-CM | POA: Insufficient documentation

## 2018-03-20 DIAGNOSIS — K5900 Constipation, unspecified: Secondary | ICD-10-CM | POA: Diagnosis not present

## 2018-03-20 DIAGNOSIS — R680 Hypothermia, not associated with low environmental temperature: Secondary | ICD-10-CM | POA: Insufficient documentation

## 2018-03-20 DIAGNOSIS — R740 Nonspecific elevation of levels of transaminase and lactic acid dehydrogenase [LDH]: Secondary | ICD-10-CM | POA: Diagnosis not present

## 2018-03-20 DIAGNOSIS — Z86718 Personal history of other venous thrombosis and embolism: Secondary | ICD-10-CM | POA: Insufficient documentation

## 2018-03-20 DIAGNOSIS — I440 Atrioventricular block, first degree: Secondary | ICD-10-CM | POA: Diagnosis not present

## 2018-03-20 DIAGNOSIS — K220 Achalasia of cardia: Secondary | ICD-10-CM | POA: Insufficient documentation

## 2018-03-20 DIAGNOSIS — D631 Anemia in chronic kidney disease: Secondary | ICD-10-CM | POA: Diagnosis not present

## 2018-03-20 DIAGNOSIS — I129 Hypertensive chronic kidney disease with stage 1 through stage 4 chronic kidney disease, or unspecified chronic kidney disease: Secondary | ICD-10-CM | POA: Diagnosis not present

## 2018-03-20 DIAGNOSIS — M4856XA Collapsed vertebra, not elsewhere classified, lumbar region, initial encounter for fracture: Secondary | ICD-10-CM | POA: Insufficient documentation

## 2018-03-20 DIAGNOSIS — E162 Hypoglycemia, unspecified: Secondary | ICD-10-CM | POA: Diagnosis not present

## 2018-03-20 DIAGNOSIS — I361 Nonrheumatic tricuspid (valve) insufficiency: Secondary | ICD-10-CM | POA: Diagnosis not present

## 2018-03-20 DIAGNOSIS — E782 Mixed hyperlipidemia: Secondary | ICD-10-CM | POA: Diagnosis not present

## 2018-03-20 DIAGNOSIS — I454 Nonspecific intraventricular block: Secondary | ICD-10-CM | POA: Insufficient documentation

## 2018-03-20 DIAGNOSIS — T68XXXA Hypothermia, initial encounter: Secondary | ICD-10-CM

## 2018-03-20 DIAGNOSIS — E1122 Type 2 diabetes mellitus with diabetic chronic kidney disease: Secondary | ICD-10-CM | POA: Insufficient documentation

## 2018-03-20 DIAGNOSIS — R001 Bradycardia, unspecified: Secondary | ICD-10-CM | POA: Diagnosis not present

## 2018-03-20 DIAGNOSIS — M19012 Primary osteoarthritis, left shoulder: Secondary | ICD-10-CM | POA: Insufficient documentation

## 2018-03-20 DIAGNOSIS — N183 Chronic kidney disease, stage 3 (moderate): Secondary | ICD-10-CM | POA: Insufficient documentation

## 2018-03-20 DIAGNOSIS — E875 Hyperkalemia: Secondary | ICD-10-CM | POA: Diagnosis not present

## 2018-03-20 DIAGNOSIS — I2510 Atherosclerotic heart disease of native coronary artery without angina pectoris: Secondary | ICD-10-CM | POA: Diagnosis not present

## 2018-03-20 DIAGNOSIS — H9190 Unspecified hearing loss, unspecified ear: Secondary | ICD-10-CM | POA: Diagnosis not present

## 2018-03-20 DIAGNOSIS — S4982XA Other specified injuries of left shoulder and upper arm, initial encounter: Secondary | ICD-10-CM | POA: Insufficient documentation

## 2018-03-20 DIAGNOSIS — M25512 Pain in left shoulder: Secondary | ICD-10-CM | POA: Diagnosis not present

## 2018-03-20 DIAGNOSIS — M109 Gout, unspecified: Secondary | ICD-10-CM | POA: Diagnosis not present

## 2018-03-20 DIAGNOSIS — N189 Chronic kidney disease, unspecified: Secondary | ICD-10-CM

## 2018-03-20 DIAGNOSIS — R55 Syncope and collapse: Secondary | ICD-10-CM | POA: Diagnosis present

## 2018-03-20 DIAGNOSIS — K5730 Diverticulosis of large intestine without perforation or abscess without bleeding: Secondary | ICD-10-CM | POA: Diagnosis not present

## 2018-03-20 DIAGNOSIS — Z7951 Long term (current) use of inhaled steroids: Secondary | ICD-10-CM | POA: Insufficient documentation

## 2018-03-20 DIAGNOSIS — S4992XA Unspecified injury of left shoulder and upper arm, initial encounter: Secondary | ICD-10-CM | POA: Diagnosis not present

## 2018-03-20 DIAGNOSIS — J986 Disorders of diaphragm: Secondary | ICD-10-CM | POA: Diagnosis not present

## 2018-03-20 DIAGNOSIS — S3992XA Unspecified injury of lower back, initial encounter: Secondary | ICD-10-CM | POA: Diagnosis not present

## 2018-03-20 DIAGNOSIS — N184 Chronic kidney disease, stage 4 (severe): Secondary | ICD-10-CM | POA: Diagnosis not present

## 2018-03-20 DIAGNOSIS — W19XXXA Unspecified fall, initial encounter: Secondary | ICD-10-CM | POA: Diagnosis not present

## 2018-03-20 DIAGNOSIS — M79602 Pain in left arm: Secondary | ICD-10-CM | POA: Diagnosis not present

## 2018-03-20 DIAGNOSIS — N179 Acute kidney failure, unspecified: Secondary | ICD-10-CM | POA: Diagnosis not present

## 2018-03-20 DIAGNOSIS — E11649 Type 2 diabetes mellitus with hypoglycemia without coma: Principal | ICD-10-CM | POA: Insufficient documentation

## 2018-03-20 DIAGNOSIS — Z79899 Other long term (current) drug therapy: Secondary | ICD-10-CM | POA: Insufficient documentation

## 2018-03-20 DIAGNOSIS — Z87891 Personal history of nicotine dependence: Secondary | ICD-10-CM | POA: Insufficient documentation

## 2018-03-20 DIAGNOSIS — K2270 Barrett's esophagus without dysplasia: Secondary | ICD-10-CM | POA: Diagnosis not present

## 2018-03-20 DIAGNOSIS — Z794 Long term (current) use of insulin: Secondary | ICD-10-CM | POA: Insufficient documentation

## 2018-03-20 DIAGNOSIS — E161 Other hypoglycemia: Secondary | ICD-10-CM | POA: Diagnosis not present

## 2018-03-20 DIAGNOSIS — Z8711 Personal history of peptic ulcer disease: Secondary | ICD-10-CM | POA: Insufficient documentation

## 2018-03-20 LAB — COMPREHENSIVE METABOLIC PANEL
ALBUMIN: 3.5 g/dL (ref 3.5–5.0)
ALK PHOS: 138 U/L — AB (ref 38–126)
ALT: 137 U/L — ABNORMAL HIGH (ref 17–63)
ALT: 438 U/L — ABNORMAL HIGH (ref 17–63)
ANION GAP: 10 (ref 5–15)
AST: 146 U/L — ABNORMAL HIGH (ref 15–41)
AST: 794 U/L — ABNORMAL HIGH (ref 15–41)
Albumin: 3 g/dL — ABNORMAL LOW (ref 3.5–5.0)
Alkaline Phosphatase: 198 U/L — ABNORMAL HIGH (ref 38–126)
Anion gap: 7 (ref 5–15)
BILIRUBIN TOTAL: 0.1 mg/dL — AB (ref 0.3–1.2)
BUN: 112 mg/dL — ABNORMAL HIGH (ref 6–20)
BUN: 102 mg/dL — ABNORMAL HIGH (ref 6–20)
CALCIUM: 9.3 mg/dL (ref 8.9–10.3)
CHLORIDE: 115 mmol/L — AB (ref 101–111)
CO2: 19 mmol/L — ABNORMAL LOW (ref 22–32)
CO2: 19 mmol/L — AB (ref 22–32)
CREATININE: 2.51 mg/dL — AB (ref 0.61–1.24)
CREATININE: 2.21 mg/dL — AB (ref 0.61–1.24)
Calcium: 8.3 mg/dL — ABNORMAL LOW (ref 8.9–10.3)
Chloride: 110 mmol/L (ref 101–111)
GFR calc Af Amer: 29 mL/min — ABNORMAL LOW (ref >60)
GFR calc non Af Amer: 22 mL/min — ABNORMAL LOW (ref >60)
GFR calc non Af Amer: 25 mL/min — ABNORMAL LOW (ref >60)
GFR, EST AFRICAN AMERICAN: 25 mL/min — AB (ref >60)
GLUCOSE: 186 mg/dL — AB (ref 65–99)
Glucose, Bld: 87 mg/dL (ref 65–99)
Potassium: 5.4 mmol/L — ABNORMAL HIGH (ref 3.5–5.1)
Potassium: 5 mmol/L (ref 3.5–5.1)
SODIUM: 141 mmol/L (ref 135–145)
Sodium: 139 mmol/L (ref 135–145)
TOTAL PROTEIN: 6.8 g/dL (ref 6.5–8.1)
Total Bilirubin: 0.3 mg/dL (ref 0.3–1.2)
Total Protein: 5.9 g/dL — ABNORMAL LOW (ref 6.5–8.1)

## 2018-03-20 LAB — RAPID URINE DRUG SCREEN, HOSP PERFORMED
Amphetamines: NOT DETECTED
BARBITURATES: NOT DETECTED
BENZODIAZEPINES: NOT DETECTED
Cocaine: NOT DETECTED
Opiates: NOT DETECTED
Tetrahydrocannabinol: NOT DETECTED

## 2018-03-20 LAB — CBG MONITORING, ED
GLUCOSE-CAPILLARY: 204 mg/dL — AB (ref 65–99)
GLUCOSE-CAPILLARY: 218 mg/dL — AB (ref 65–99)
Glucose-Capillary: 116 mg/dL — ABNORMAL HIGH (ref 65–99)

## 2018-03-20 LAB — I-STAT CG4 LACTIC ACID, ED
Lactic Acid, Venous: 1.91 mmol/L — ABNORMAL HIGH (ref 0.5–1.9)
Lactic Acid, Venous: 1.22 mmol/L (ref 0.5–1.9)

## 2018-03-20 LAB — URINALYSIS, ROUTINE W REFLEX MICROSCOPIC
BACTERIA UA: NONE SEEN
BILIRUBIN URINE: NEGATIVE
Glucose, UA: NEGATIVE mg/dL
KETONES UR: NEGATIVE mg/dL
LEUKOCYTES UA: NEGATIVE
Nitrite: NEGATIVE
PROTEIN: NEGATIVE mg/dL
Specific Gravity, Urine: 1.011 (ref 1.005–1.030)
pH: 5 (ref 5.0–8.0)

## 2018-03-20 LAB — TROPONIN I

## 2018-03-20 LAB — PROTIME-INR
INR: 0.92
PROTHROMBIN TIME: 12.3 s (ref 11.4–15.2)

## 2018-03-20 LAB — CBC
HCT: 33.3 % — ABNORMAL LOW (ref 39.0–52.0)
HEMOGLOBIN: 10.1 g/dL — AB (ref 13.0–17.0)
MCH: 26.4 pg (ref 26.0–34.0)
MCHC: 30.3 g/dL (ref 30.0–36.0)
MCV: 87.2 fL (ref 78.0–100.0)
PLATELETS: 93 10*3/uL — AB (ref 150–400)
RBC: 3.82 MIL/uL — ABNORMAL LOW (ref 4.22–5.81)
RDW: 17.1 % — ABNORMAL HIGH (ref 11.5–15.5)
WBC: 4.2 10*3/uL (ref 4.0–10.5)

## 2018-03-20 LAB — GLUCOSE, CAPILLARY
GLUCOSE-CAPILLARY: 68 mg/dL (ref 65–99)
Glucose-Capillary: 80 mg/dL (ref 65–99)
Glucose-Capillary: 133 mg/dL — ABNORMAL HIGH (ref 65–99)
Glucose-Capillary: 111 mg/dL — ABNORMAL HIGH (ref 65–99)

## 2018-03-20 LAB — HEMOGLOBIN A1C
HEMOGLOBIN A1C: 7 % — AB (ref 4.8–5.6)
Mean Plasma Glucose: 154.2 mg/dL

## 2018-03-20 LAB — ECHOCARDIOGRAM COMPLETE

## 2018-03-20 LAB — TSH: TSH: 3.359 u[IU]/mL (ref 0.350–4.500)

## 2018-03-20 LAB — I-STAT TROPONIN, ED: TROPONIN I, POC: 0 ng/mL (ref 0.00–0.08)

## 2018-03-20 LAB — ETHANOL: Alcohol, Ethyl (B): <10 mg/dL (ref <10)

## 2018-03-20 MED ORDER — ALLOPURINOL 100 MG PO TABS
100.0000 mg | ORAL_TABLET | Freq: Every day | ORAL | Status: DC
  Administered 2018-03-21 – 2018-03-23 (×3): 100 mg via ORAL
  Filled 2018-03-20 (×3): qty 1

## 2018-03-20 MED ORDER — ACETAMINOPHEN 650 MG RE SUPP: 650.0000 mg | Freq: Four times a day (QID) | RECTAL | Status: DC | PRN

## 2018-03-20 MED ORDER — ENOXAPARIN SODIUM 30 MG/0.3ML ~~LOC~~ SOLN
30.0000 mg | SUBCUTANEOUS | Status: DC
  Administered 2018-03-20 – 2018-03-22 (×3): 30 mg via SUBCUTANEOUS
  Filled 2018-03-20 (×3): qty 0.3

## 2018-03-20 MED ORDER — VITAMIN B-12 100 MCG PO TABS
100.0000 ug | ORAL_TABLET | Freq: Every day | ORAL | Status: DC
  Administered 2018-03-21 – 2018-03-23 (×3): 100 ug via ORAL
  Filled 2018-03-20 (×3): qty 1

## 2018-03-20 MED ORDER — ACETAMINOPHEN 325 MG PO TABS
650.0000 mg | ORAL_TABLET | Freq: Four times a day (QID) | ORAL | Status: DC | PRN
  Administered 2018-03-20: 650 mg via ORAL
  Filled 2018-03-20 (×2): qty 2

## 2018-03-20 MED ORDER — SODIUM CHLORIDE 0.9 % IV BOLUS (SEPSIS)
1000.0000 mL | Freq: Once | INTRAVENOUS | Status: AC
  Administered 2018-03-20: 1000 mL via INTRAVENOUS

## 2018-03-20 MED ORDER — FERROUS SULFATE 325 (65 FE) MG PO TABS
325.0000 mg | ORAL_TABLET | Freq: Every day | ORAL | Status: DC
  Administered 2018-03-21 – 2018-03-23 (×3): 325 mg via ORAL
  Filled 2018-03-20 (×3): qty 1

## 2018-03-20 MED ORDER — POLYETHYLENE GLYCOL 3350 17 GM/SCOOP PO POWD: 17.0000 g | Freq: Two times a day (BID) | ORAL | Status: DC

## 2018-03-20 MED ORDER — SODIUM BICARBONATE 650 MG PO TABS
650.0000 mg | ORAL_TABLET | Freq: Two times a day (BID) | ORAL | Status: DC
  Administered 2018-03-20 – 2018-03-23 (×6): 650 mg via ORAL
  Filled 2018-03-20 (×6): qty 1

## 2018-03-20 MED ORDER — TIMOLOL MALEATE 0.5 % OP SOLN
1.0000 [drp] | Freq: Two times a day (BID) | OPHTHALMIC | Status: DC
  Administered 2018-03-20 – 2018-03-23 (×6): 1 [drp] via OPHTHALMIC
  Filled 2018-03-20: qty 5

## 2018-03-20 MED ORDER — SODIUM CHLORIDE 0.9 % IV SOLN
Freq: Once | INTRAVENOUS | Status: AC
  Administered 2018-03-20: 08:00:00 via INTRAVENOUS

## 2018-03-20 MED ORDER — LEVOTHYROXINE SODIUM 75 MCG PO TABS
75.0000 ug | ORAL_TABLET | Freq: Every day | ORAL | Status: DC
  Administered 2018-03-21 – 2018-03-23 (×3): 75 ug via ORAL
  Filled 2018-03-20 (×3): qty 1

## 2018-03-20 MED ORDER — INSULIN ASPART 100 UNIT/ML ~~LOC~~ SOLN
0.0000 [IU] | Freq: Three times a day (TID) | SUBCUTANEOUS | Status: DC
  Administered 2018-03-21: 3 [IU] via SUBCUTANEOUS

## 2018-03-20 MED ORDER — POLYETHYLENE GLYCOL 3350 17 G PO PACK
17.0000 g | PACK | Freq: Two times a day (BID) | ORAL | Status: DC
  Administered 2018-03-21 – 2018-03-23 (×4): 17 g via ORAL
  Filled 2018-03-20 (×6): qty 1

## 2018-03-20 MED ORDER — METOPROLOL SUCCINATE ER 25 MG PO TB24
25.0000 mg | ORAL_TABLET | Freq: Every day | ORAL | Status: DC
  Administered 2018-03-20 – 2018-03-21 (×2): 25 mg via ORAL
  Filled 2018-03-20 (×2): qty 1

## 2018-03-20 MED ORDER — COSYNTROPIN 0.25 MG IJ SOLR: 0.2500 mg | Freq: Once | INTRAMUSCULAR | Status: DC

## 2018-03-20 MED ORDER — SODIUM POLYSTYRENE SULFONATE 15 GM/60ML PO SUSP
30.0000 g | Freq: Once | ORAL | Status: AC
  Administered 2018-03-20: 30 g via ORAL
  Filled 2018-03-20: qty 120

## 2018-03-20 MED ORDER — PRAVASTATIN SODIUM 40 MG PO TABS: 40.0000 mg | ORAL_TABLET | Freq: Every day | ORAL | Status: DC

## 2018-03-20 MED ORDER — COSYNTROPIN 0.25 MG IJ SOLR
0.2500 mg | Freq: Once | INTRAMUSCULAR | Status: AC
  Administered 2018-03-21: 0.25 mg via INTRAVENOUS
  Filled 2018-03-20: qty 0.25

## 2018-03-20 MED ORDER — COLCHICINE 0.6 MG PO TABS
0.3000 mg | ORAL_TABLET | Freq: Every day | ORAL | Status: DC
Start: 2018-03-20 — End: 2018-03-20

## 2018-03-20 MED ORDER — HYDRALAZINE HCL 50 MG PO TABS: 50.0000 mg | ORAL_TABLET | Freq: Three times a day (TID) | ORAL | Status: DC

## 2018-03-20 MED ORDER — PANTOPRAZOLE SODIUM 40 MG PO TBEC
40.0000 mg | DELAYED_RELEASE_TABLET | Freq: Two times a day (BID) | ORAL | Status: DC
  Administered 2018-03-20 – 2018-03-23 (×6): 40 mg via ORAL
  Filled 2018-03-20 (×6): qty 1

## 2018-03-20 NOTE — ED Notes (Signed)
Attempted to call report x2

## 2018-03-20 NOTE — ED Notes (Signed)
Attempted to call report x 1  

## 2018-03-20 NOTE — ED Notes (Signed)
Dr. Emmaline Life (FM) paged to [place], PA-paged by Levada Dy

## 2018-03-20 NOTE — ED Notes (Signed)
Patient transported to X-ray 

## 2018-03-20 NOTE — ED Notes (Signed)
Initiating Bare Hugger warming device.

## 2018-03-20 NOTE — ED Provider Notes (Signed)
[facility] EMERGENCY DEPARTMENT Provider Note   CSN: [id number] Arrival date & time: 03/20/18  6761     History   Chief Complaint Chief Complaint  Patient presents with  . Hypoglycemia  . Altered Mental Status    HPI Philip Matthews is a 84 y.o. male.  HPI Philip Matthews is a 84 y.o. male coming from home with complaint of syncope. Pt's family apparently woke up this morning smelling something burned. They went into the kitchen where there was a pot of guave sugar on the stove burning and pt was laying on the floor unconscious. EMS was called. Initial sugar 18. Pt received glucagon 48m. CBG increased to 60. Pt then received D10 by EMS. Sugar now 204. Pt is more alert and oriented. According to daughter, pt ran out of insulin needled over the last few weeks so he has not been getting his insulin. Pt has no complaints right now other than feeling weak.   Pt is mute and deaf, interpreter used for history and evaluation  Past Medical History:  Diagnosis Date  . Anemia   . Arthritis    "joints" (08/27/2017)  . ARTHRITIS, LEFT SHOULDER 02/01/2010   Qualifier: Diagnosis of  By: SJacques Earthly   . Chronic kidney disease (CKD), stage IV (severe) ([place]   . Deaf   . Diverticulosis   . DIVERTICULOSIS OF COLON 03/08/2010   Qualifier: Diagnosis of  By: SJacques Earthly   . Esophagitis    HSV  . Gout   . History of DVT (deep vein thrombosis)   . Hyperlipidemia   . Hypertension   . Hypothyroidism   . Kidney mass    lipoma  . Liver nodule    u/s 310  . Portal vein thrombosis    on life long coumadin  . PUD, HX OF 03/08/2010   Qualifier: History of  By: SJacques Earthly   . Type II diabetes mellitus ([facility]     Patient Active Problem List   Diagnosis Date Noted  . Melena   . GI bleed 08/28/2017  . Bilateral deafness   . Uses visual frame sign language interpreter   . Right thigh pain 07/24/2017  . Osteoarthritis of left shoulder 04/07/2017  . Health  care maintenance 10/23/2016  . Chronic anticoagulation 10/23/2016  . Elevated LFTs 03/18/2016  . Hyperkalemia 11/11/2012  . Allergic rhinitis 09/08/2012  . Open-angle glaucoma 08/05/2012  . Hypothyroid 06/17/2012  . BARRETTS ESOPHAGUS 07/11/2010  . Mixed hyperlipidemia 06/20/2010  . Portal vein thrombosis 06/08/2010  . Gout 06/07/2010  . LIVER MASS 03/08/2010  . LUNG NODULE 03/08/2010  . Type II diabetes mellitus, controlled but with peripheral neuropathy 02/01/2010  . Anemia in chronic kidney disease 02/01/2010  . Isolated systolic HTN, goal SBP <<950  02/01/2010  . Chronic kidney disease (CKD), stage III (moderate) ([place] 02/01/2010    Past Surgical History:  Procedure Laterality Date  . COLON SURGERY     "something was twisted; that messed w/my BM; had to have it untwisted"  . HERNIA REPAIR    . LIVER LOBECTOMY    . SHOULDER ARTHROCENTESIS Left     OB History   None      Home Medications    Prior to Admission medications   Medication Sig Start Date End Date Taking? Authorizing Provider  acetaminophen (TYLENOL) 650 MG CR tablet Take 1 tablet (650 mg total) by mouth daily as needed for pain (Patient reports taking about  1 time a week). 07/24/17   Carlyle Dolly, MD  allopurinol (ZYLOPRIM) 100 MG tablet take 1 tablet by mouth once daily 11/19/17   Mayo, Pete Pelt, MD  Blood Glucose Monitoring Suppl (ONE TOUCH ULTRA 2) w/Device KIT 1 Device by Does not apply route once. Patient not taking: Reported on 08/29/2017 03/18/16   Olam Idler, MD  calcitRIOL (ROCALTROL) 0.25 MCG capsule Take 1 capsule (0.25 mcg total) by mouth every Monday, Wednesday, and Friday. 01/27/15   Olam Idler, MD  Calcium 500-125 MG-UNIT TABS Take 1 tablet by mouth 2 (two) times daily. Over the counter 01/27/15   Olam Idler, MD  colchicine 0.6 MG tablet take 1/2 tablet by mouth once daily 01/15/18   Mayo, Pete Pelt, MD  docusate sodium (COLACE) 100 MG capsule Take 100 mg by mouth 2 (two)  times daily. Over the counter    [provider]  epoetin alfa (PROCRIT) 93734 UNIT/ML injection Inject 1 mL (10,000 Units total) into the skin 3 (three) times a week. 09/24/11   Lyndal Pulley, DO  feeding supplement (BOOST / RESOURCE BREEZE) LIQD Take 1 Container by mouth 3 (three) times daily between meals. 08/29/17   Carlyle Dolly, MD  ferrous sulfate (FERRO-BOB) 325 (65 FE) MG tablet Take 325 mg by mouth daily. Over the counter    [provider]  fluticasone (FLONASE) 50 MCG/ACT nasal spray Place 1 spray into both nostrils daily. Reported on 03/28/2016    [provider]  furosemide (LASIX) 40 MG tablet take 1/2 tablets by mouth once daily Patient taking differently: take 1 tablet (80m) by mouth once daily 06/01/14   Losq, SBurnell Blanks MD  glucose blood (ONE TOUCH ULTRA TEST) test strip Check sugar up to 6 x daily Patient not taking: Reported on 08/29/2017 07/28/17   NDickie La MD  hydrALAZINE (APRESOLINE) 50 MG tablet take 1 tablet by mouth three times a day Patient taking differently: take 1 tablet (587m by mouth three times a day 04/21/17   Mayo, KaPete PeltMD  insulin glargine (LANTUS) 100 unit/mL SOPN Inject 0.04 mLs (4 Units total) into the skin daily before breakfast. 09/26/17   Mayo, KaPete PeltMD  insulin lispro (HUMALOG KWIKPEN) 100 UNIT/ML KiwkPen Inject 0.03 mLs (3 Units total) into the skin 3 (three) times daily with meals. 03/13/18   Mayo, KaPete PeltMD  Insulin Pen Needle (NOVOFINE) 32G X 6 MM MISC USE 3 TIMES DAILY 03/13/18   Mayo, KaPete PeltMD  Lancets ([facility] SurgicenterLTRASOFT) lancets Check sugar up to 6 x daily Patient not taking: Reported on 08/29/2017 03/18/16   JoOlam IdlerMD  latanoprost (XALATAN) 0.005 % ophthalmic solution Place 1 drop into both eyes at bedtime.    [provider]  levothyroxine (SYNTHROID, LEVOTHROID) 75 MCG tablet take 1 tablet by mouth once daily 09/11/17   Mayo, KaPete PeltMD  levothyroxine (SYNTHROID,  LEVOTHROID) 75 MCG tablet take 1 tablet by mouth once daily 09/11/17   Mayo, KaPete PeltMD  levothyroxine (SYNTHROID, LEVOTHROID) 75 MCG tablet TAKE 1 TABLET BY MOUTH ONCE DAILY 03/11/18   Mayo, KaPete PeltMD  metoprolol succinate (TOPROL-XL) 25 MG 24 hr tablet take 1 tablet by mouth once daily 01/15/18   Mayo, KaPete PeltMD  Multiple Vitamin (MULTIVITAMIN) tablet Take 1 tablet by mouth daily. Over the counter    [provider]  ONKurten each by Does not apply route every morning. Patient  not taking: Reported on 08/29/2017 03/29/15   Olam Idler, MD  pantoprazole (PROTONIX) 40 MG tablet take 1 tablet by mouth twice a day 09/11/17   Mayo, Pete Pelt, MD  pantoprazole (PROTONIX) 40 MG tablet take 1 tablet by mouth twice a day 02/12/18   Mayo, Pete Pelt, MD  pantoprazole (PROTONIX) 40 MG tablet TAKE 1 TABLET BY MOUTH TWICE A DAY 03/11/18   Mayo, Pete Pelt, MD  polyethylene glycol powder (GLYCOLAX/MIRALAX) powder Take 17 g by mouth 2 (two) times daily. Hold for loose stools 09/05/16   Mayo, Pete Pelt, MD  pravastatin (PRAVACHOL) 40 MG tablet take 1 tablet by mouth once daily 11/06/17   Mayo, Pete Pelt, MD  sodium bicarbonate 650 MG tablet Take 1 tablet (650 mg total) by mouth 2 (two) times daily. 07/16/13   Marin Olp, MD  sodium polystyrene (KAYEXALATE) 15 GM/60ML suspension Take 60 mLs (15 g total) by mouth once. 3x weekly (tu/th/sat) to prevent high potassium. 07/16/13   Marin Olp, MD  timolol (TIMOPTIC) 0.5 % ophthalmic solution Place 1 drop into both eyes as directed.  09/22/12   [provider]  vitamin B-12 (CYANOCOBALAMIN) 100 MCG tablet Take 100 mcg by mouth daily. Over the counter    [provider]    Family History Family History  Problem Relation Age of Onset  . Cancer Father        prostate  . Heart disease Mother     Social History Social History   Tobacco Use  . Smoking status: Former Smoker    Types: Cigarettes     Last attempt to quit: 01/18/2011    Years since quitting: 7.1  . Smokeless tobacco: Never Used  . Tobacco comment: 08/27/2017 "didn't smoke alot; can't tell you how long I smoked"  Substance Use Topics  . Alcohol use: No  . Drug use: No     Allergies   Patient has no known allergies.   Review of Systems Review of Systems  Constitutional: Negative for chills and fever.  Respiratory: Negative for cough, chest tightness and shortness of breath.   Cardiovascular: Negative for chest pain, palpitations and leg swelling.  Gastrointestinal: Negative for abdominal distention, abdominal pain, diarrhea, nausea and vomiting.  Genitourinary: Negative for dysuria, frequency, hematuria and urgency.  Musculoskeletal: Negative for arthralgias, myalgias, neck pain and neck stiffness.  Skin: Negative for rash.  Allergic/Immunologic: Negative for immunocompromised state.  Neurological: Positive for dizziness, syncope, weakness and light-headedness. Negative for numbness and headaches.  All other systems reviewed and are negative.    Physical Exam Updated Vital Signs BP (!) 179/82 (BP Location: Right Arm)   Pulse (!) 52   Temp (!) 91.2 F (32.9 C) (Rectal)   Resp (!) 21   SpO2 100%   Physical Exam  Constitutional: He is oriented to person, place, and time. He appears well-developed and well-nourished. No distress.  Feels cold to the touch  HENT:  Head: Normocephalic and atraumatic.  Eyes: Conjunctivae are normal.  Neck: Neck supple.  Cardiovascular: Normal rate, regular rhythm and normal heart sounds.  Pulmonary/Chest: Effort normal. No respiratory distress. He has no wheezes. He has no rales.  Abdominal: Soft. Bowel sounds are normal. He exhibits no distension. There is no tenderness. There is no rebound.  Musculoskeletal: He exhibits no edema.  Neurological: He is alert and oriented to person, place, and time.  Generalized weakness with no focal deficits. Moving all extremities.   Skin:  Skin is warm and dry.  Nursing note and vitals reviewed.    ED Treatments / Results  Labs (all labs ordered are listed, but only abnormal results are displayed) Labs Reviewed  CULTURE, BLOOD (ROUTINE X 2)  CULTURE, BLOOD (ROUTINE X 2)  COMPREHENSIVE METABOLIC PANEL  CBC  PROTIME-INR  URINALYSIS, ROUTINE W REFLEX MICROSCOPIC  CBG MONITORING, ED  I-STAT CG4 LACTIC ACID, ED  I-STAT TROPONIN, ED    EKG  EKG Interpretation None       Radiology No results found.  Procedures Procedures (including critical care time)  Medications Ordered in ED Medications  sodium chloride 0.9 % bolus 1,000 mL (has no administration in time range)     Initial Impression / Assessment and Plan / ED Course  I have reviewed the triage vital signs and the nursing notes.  Pertinent labs & imaging results that were available during my care of the patient were reviewed by me and considered in my medical decision making (see chart for details).     Pt with syncopal episode, initial cbg 18. Interesting since pt has not had insulin over last several weeks. Pt received glucose by EMS and glucagon, cbg now 204. Pt alert and oriented, just generally weak. Rectal temp 91.2. bair hugger applied. Labs pending. Will feed. Pt was in his normal state of health yesterday.   8:24 AM 6 patient's 8, he continues to be alert and oriented, he has no complaints.  He is receiving IV fluids and is on [facility].  I discussed results with family again, it sounds like patient did restart his insulin and took both Lantus and NovoLog last night before going to bed and did not eat.  He has not had insulin for a week and states his sugar has been in 100s.  Most likely took too much insulin and without food that is what dropped his sugar.  At this time his vital signs are normal, he does not meet Sirs criteria.  I discussed results with family practice and they will admit patient for further observation and  treatment.    Final Clinical Impressions(s) / ED Diagnoses   Final diagnoses:  Hypoglycemia  Acute renal failure superimposed on chronic kidney disease, unspecified CKD stage, unspecified acute renal failure type [facility])  Hypothermia, initial encounter    ED Discharge Orders    None       Janee Morn 74/94/49 [id number]    Delora Fuel, MD 16/38/46 2300

## 2018-03-20 NOTE — ED Notes (Signed)
Pt given orange juice, crackers, sandwich meal to eat. Pt alert and able to feed self independently.

## 2018-03-20 NOTE — ED Notes (Signed)
Spoke with Daughter [phone]

## 2018-03-20 NOTE — H&P (Signed)
[facility] Admission History and Physical Service Pager: [phone]  Patient name: Philip Matthews Medical record number: [id number] Date of birth: [birth year] Age: 84 y.o. Gender: male  Primary Care Provider: Mayo, Pete Pelt, MD Consultants: None  Code Status: Full   Chief Complaint: Syncope   Assessment and Plan: Philip Matthews is a 84 y.o. male presenting with syncope . PMH is significant for the osteoarthritis anemia, hyperkalemia, hypothyroidism, gout, type 2 diabetes, hypertension,  Syncopal Episode: likely metabolic due to hypoglycemia as patient was found down with a CBG of 18.  Patient denies any symptoms of chest pain or shortness of breath making cardiac unlikely though notable  EKG with T- wave inversions in Lead 2,3 and AVF, troponin i-STAT 0.  Denies any history of illness, notable for hypothermia down to 91.1 improving on a bair hugger.  No WBC changes concerning  for infection, no tachypnea, no tachycardia;therefore not meeting SIRS criteria currently. UA negative, no symptoms.  History of hypothyroidism the patient has been taking Synthroid without issue, previously stable TSH, making thyroid changes unlikely associated with this episode. Normal neurologic exam, no focal findings or numbness or tingling, therefore little concern for stroke. - Admit to telemetry, Dr Ardelia Mems  - Troponin x 3 + AM EKG  - Obtain ECHO - Obtain CXR  - UDS + ethanol level pending  - Will stop insulin for now   - Blood cultures pending  - PT/OT consult   Transaminates: Notable for hx of transaminates noted on the problem lisit possibly due to fatty liver disease, previous ultrasound in 2012 notable diffuse fatty infiltrates. Denies any abdominal pain, nausea or vomiting  -  Trend CMET's  -  Obtain hepatitis panel  -  Obtain PT/INR   Hypothermia: Temp 94.4, hx of low temps per daughter over the past month when home nurse comes by to check on patient. Given patient's hx  of hyperkalemia, hypothermia, and presenting with hypoglycemia, would perform ACTH stimulation test to rule out adrenal insuffiencey  - Will perform ACTH stimulation test  -  Blood cultures pending  Anemia:Hgb 10.1; baseline hemoglobin 10. Likely due to CKD, currently on Alfa Epo. Hx of colonoscopy in 2011 with diverticulosis of colon   -  Continue to follow  - Continue iron supplement   AoCKD stage 3: Baseline SCr 2.0 since 2018, SCr 2.51. Received a 1 Liter bolus down in the ED  - Encourage Fluid intake - Hold Lasix for now   - Continue to follow   Hyperkalemia: Hx of hyperkalemia, K 5.4. No peaked T-waves.  - Will give Kayexlate x 1  - Recheck at BMET at 4 PM  T2DM: Hgb A1C 6.8 05/2017. Home regiment includes Lantus 4 units and Humalog 3 units TID with meals  - Obtain A1C - Sensitive sliding scale   Hypertension: Normotensive  - Hold hydralazine and lasix for now  - Continue metoprolol XR   Hypothyroidism  - TSH pending  - Continue Synthroid   Gout  - Holding colcihine given kidney function  - Continue Allopurinol   FEN/GI: Carb modified  Prophylaxis: Lovenox   Disposition: Home   History of Present Illness:  Philip Matthews is a 84 y.o. male presenting following a syncopal episode.  Per family patient was well last night with no symptoms.  He had been out of his insulin for about 1 week.  So last night he decided to take both his Lantus and Humalog around 8 PM before going to  bed.  He had a had at dinner earlier around 5 6 PM however did not have anything else to eat thereafter.  He woke up in the morning and felt dizzy and weak in the knees and fell to the ground in the kitchen.  Apparently he normally cooks breakfast in the morning.  And the family awoke to the smell of burning food on the stove and found him on the floor unconscious.  EMS was called and he had a CBG of 18 on arrival.  They provided glucagon and his CBG improved he awoke and was transported to the hospital  where his CBG was 200.  Patient is unable to tell us how much insulin he took last night he cannot remember.  He denied having any symptoms of feeling ill previously overall has been feeling very well.  Denies any cough, congestion, palpitations, chest pain, dysuria, focal weakness, headaches.   Review Of Systems: Per HPI with the following additions:  Review of Systems  Constitutional: Negative for chills and fever.  HENT: Negative for congestion.   Eyes: Negative for blurred vision and double vision.  Respiratory: Negative for cough and shortness of breath.   Cardiovascular: Negative for chest pain and palpitations.  Gastrointestinal: Negative for abdominal pain, blood in stool, nausea and vomiting.  Genitourinary: Negative for dysuria, frequency and urgency.  Musculoskeletal: Positive for joint pain.  Skin: Negative for rash.  Neurological: Negative for dizziness and headaches.    Patient Active Problem List   Diagnosis Date Noted  . Syncope 03/20/2018  . Melena   . GI bleed 08/28/2017  . Bilateral deafness   . Uses visual frame sign language interpreter   . Right thigh pain 07/24/2017  . Osteoarthritis of left shoulder 04/07/2017  . Health care maintenance 10/23/2016  . Chronic anticoagulation 10/23/2016  . Elevated LFTs 03/18/2016  . Hyperkalemia 11/11/2012  . Allergic rhinitis 09/08/2012  . Open-angle glaucoma 08/05/2012  . Hypothyroid 06/17/2012  . BARRETTS ESOPHAGUS 07/11/2010  . Mixed hyperlipidemia 06/20/2010  . Portal vein thrombosis 06/08/2010  . Gout 06/07/2010  . LIVER MASS 03/08/2010  . LUNG NODULE 03/08/2010  . Type II diabetes mellitus, controlled but with peripheral neuropathy 02/01/2010  . Anemia in chronic kidney disease 02/01/2010  . Isolated systolic HTN, goal SBP <469.  02/01/2010  . Chronic kidney disease (CKD), stage III (moderate) ([place]) 02/01/2010    Past Medical History: Past Medical History:  Diagnosis Date  . Anemia   . Arthritis     "joints" (08/27/2017)  . ARTHRITIS, LEFT SHOULDER 02/01/2010   Qualifier: Diagnosis of  By: Jacques Earthly    . Chronic kidney disease (CKD), stage IV (severe) ([place])   . Deaf   . Diverticulosis   . DIVERTICULOSIS OF COLON 03/08/2010   Qualifier: Diagnosis of  By: Jacques Earthly    . Esophagitis    HSV  . Gout   . History of DVT (deep vein thrombosis)   . Hyperlipidemia   . Hypertension   . Hypothyroidism   . Kidney mass    lipoma  . Liver nodule    u/s 310  . Portal vein thrombosis    on life long coumadin  . PUD, HX OF 03/08/2010   Qualifier: History of  By: Jacques Earthly    . Type II diabetes mellitus ([place])     Past Surgical History: Past Surgical History:  Procedure Laterality Date  . COLON SURGERY     "something was twisted; that messed w/my BM;  had to have it untwisted"  . HERNIA REPAIR    . LIVER LOBECTOMY    . SHOULDER ARTHROCENTESIS Left     Social History: Social History   Tobacco Use  . Smoking status: Former Smoker    Types: Cigarettes    Last attempt to quit: 01/18/2011    Years since quitting: 7.1  . Smokeless tobacco: Never Used  . Tobacco comment: 08/27/2017 "didn't smoke alot; can't tell you how long I smoked"  Substance Use Topics  . Alcohol use: No  . Drug use: No   Additional social history:   Please also refer to relevant sections of EMR.  Family History: Family History  Problem Relation Age of Onset  . Cancer Father        prostate  . Heart disease Mother     Allergies and Medications: No Known Allergies No current facility-administered medications on file prior to encounter.    Current Outpatient Medications on File Prior to Encounter  Medication Sig Dispense Refill  . acetaminophen (TYLENOL) 650 MG CR tablet Take 1 tablet (650 mg total) by mouth daily as needed for pain (Patient reports taking about 1 time a week). 30 tablet 1  . allopurinol (ZYLOPRIM) 100 MG tablet take 1 tablet by mouth once daily 90 tablet 1  . Blood  Glucose Monitoring Suppl (ONE TOUCH ULTRA 2) w/Device KIT 1 Device by Does not apply route once. (Patient not taking: Reported on 08/29/2017) 1 each 0  . calcitRIOL (ROCALTROL) 0.25 MCG capsule Take 1 capsule (0.25 mcg total) by mouth every Monday, Wednesday, and Friday. 30 capsule 2  . Calcium 500-125 MG-UNIT TABS Take 1 tablet by mouth 2 (two) times daily. Over the counter 60 each 2  . colchicine 0.6 MG tablet take 1/2 tablet by mouth once daily 45 tablet 1  . docusate sodium (COLACE) 100 MG capsule Take 100 mg by mouth 2 (two) times daily. Over the counter    . epoetin alfa (PROCRIT) 40102 UNIT/ML injection Inject 1 mL (10,000 Units total) into the skin 3 (three) times a week. 1 mL   . feeding supplement (BOOST / RESOURCE BREEZE) LIQD Take 1 Container by mouth 3 (three) times daily between meals. 30 Container 0  . ferrous sulfate (FERRO-BOB) 325 (65 FE) MG tablet Take 325 mg by mouth daily. Over the counter    . fluticasone (FLONASE) 50 MCG/ACT nasal spray Place 1 spray into both nostrils daily. Reported on 03/28/2016    . furosemide (LASIX) 40 MG tablet take 1/2 tablets by mouth once daily (Patient taking differently: take 1 tablet (36m) by mouth once daily) 45 tablet 1  . glucose blood (ONE TOUCH ULTRA TEST) test strip Check sugar up to 6 x daily (Patient not taking: Reported on 08/29/2017) 200 each 3  . hydrALAZINE (APRESOLINE) 50 MG tablet take 1 tablet by mouth three times a day (Patient taking differently: take 1 tablet (537m by mouth three times a day) 270 tablet 3  . insulin glargine (LANTUS) 100 unit/mL SOPN Inject 0.04 mLs (4 Units total) into the skin daily before breakfast. 15 mL 5  . insulin lispro (HUMALOG KWIKPEN) 100 UNIT/ML KiwkPen Inject 0.03 mLs (3 Units total) into the skin 3 (three) times daily with meals. 15 mL 2  . Insulin Pen Needle (NOVOFINE) 32G X 6 MM MISC USE 3 TIMES DAILY 200 each 2  . Lancets (ONETOUCH ULTRASOFT) lancets Check sugar up to 6 x daily (Patient not taking:  Reported on 08/29/2017) 100 each 12  .  latanoprost (XALATAN) 0.005 % ophthalmic solution Place 1 drop into both eyes at bedtime.    Marland Kitchen levothyroxine (SYNTHROID, LEVOTHROID) 75 MCG tablet take 1 tablet by mouth once daily 90 tablet 0  . levothyroxine (SYNTHROID, LEVOTHROID) 75 MCG tablet take 1 tablet by mouth once daily 90 tablet 0  . levothyroxine (SYNTHROID, LEVOTHROID) 75 MCG tablet TAKE 1 TABLET BY MOUTH ONCE DAILY 90 tablet 0  . metoprolol succinate (TOPROL-XL) 25 MG 24 hr tablet take 1 tablet by mouth once daily 90 tablet 1  . Multiple Vitamin (MULTIVITAMIN) tablet Take 1 tablet by mouth daily. Over the counter    . ONETOUCH DELICA LANCETS FINE MISC 1 each by Does not apply route every morning. (Patient not taking: Reported on 08/29/2017) 100 each 11  . pantoprazole (PROTONIX) 40 MG tablet take 1 tablet by mouth twice a day 60 tablet 0  . pantoprazole (PROTONIX) 40 MG tablet take 1 tablet by mouth twice a day 60 tablet 0  . pantoprazole (PROTONIX) 40 MG tablet TAKE 1 TABLET BY MOUTH TWICE A DAY 60 tablet 0  . polyethylene glycol powder (GLYCOLAX/MIRALAX) powder Take 17 g by mouth 2 (two) times daily. Hold for loose stools 527 g 6  . pravastatin (PRAVACHOL) 40 MG tablet take 1 tablet by mouth once daily 90 tablet 1  . sodium bicarbonate 650 MG tablet Take 1 tablet (650 mg total) by mouth 2 (two) times daily. 180 tablet 3  . sodium polystyrene (KAYEXALATE) 15 GM/60ML suspension Take 60 mLs (15 g total) by mouth once. 3x weekly (tu/th/sat) to prevent high potassium. 500 mL 11  . timolol (TIMOPTIC) 0.5 % ophthalmic solution Place 1 drop into both eyes as directed.     . vitamin B-12 (CYANOCOBALAMIN) 100 MCG tablet Take 100 mcg by mouth daily. Over the counter      Objective: BP (!) 108/58   Pulse 83   Temp (!) 94.4 F (34.7 C) (Rectal)   Resp 17   SpO2 98%  Exam: Physical Exam  Constitutional: He is oriented to person, place, and time and well-developed, well-nourished, and in no distress.   HENT:  Head: Normocephalic and atraumatic.  Eyes: Pupils are equal, round, and reactive to light. Conjunctivae are normal.  Neck: Normal range of motion. Neck supple.  Cardiovascular: Normal rate, regular rhythm and normal heart sounds.  Pulmonary/Chest: Effort normal and breath sounds normal.  Abdominal: Soft. Bowel sounds are normal.  Musculoskeletal: Normal range of motion.  Neurological: He is alert and oriented to person, place, and time. No cranial nerve deficit. Coordination normal.  Skin: Skin is warm.   Labs and Imaging: CBC BMET  Recent Labs  Lab 03/20/18 0631  WBC 4.2  HGB 10.1*  HCT 33.3*  PLT 93*   Recent Labs  Lab 03/20/18 0631  NA 139  K 5.4*  CL 110  CO2 19*  BUN 112*  CREATININE 2.51*  GLUCOSE 186*  CALCIUM 9.3     No results found.  Tonette Bihari, MD 03/20/2018, 9:36 AM PGY-3, [place] Intern pager: [phone], text pages welcome

## 2018-03-20 NOTE — ED Triage Notes (Signed)
Pt BIB EMS from home. Family member noted strong burning smell in home and found pt unconscious on floor. Per EMS, initial CBG 18. Given 1mg  glucagon by EMS. CBG incr'd to 60. Given D10 by EMS after IV access established. Pt noted to be more alert. Pt deaf at baseline.

## 2018-03-20 NOTE — Progress Notes (Signed)
  Echocardiogram 2D Echocardiogram has been performed.  Philip Matthews [name] 03/20/2018, 1:47 PM

## 2018-03-20 NOTE — ED Notes (Signed)
Pharmacy at bedside

## 2018-03-20 NOTE — ED Notes (Signed)
Pt kept in room and had portable xray due to low tempeture and bair hugger.

## 2018-03-21 ENCOUNTER — Observation Stay (HOSPITAL_COMMUNITY): Payer: Medicare Other

## 2018-03-21 DIAGNOSIS — I129 Hypertensive chronic kidney disease with stage 1 through stage 4 chronic kidney disease, or unspecified chronic kidney disease: Secondary | ICD-10-CM | POA: Diagnosis not present

## 2018-03-21 DIAGNOSIS — R55 Syncope and collapse: Secondary | ICD-10-CM | POA: Diagnosis not present

## 2018-03-21 DIAGNOSIS — I441 Atrioventricular block, second degree: Secondary | ICD-10-CM

## 2018-03-21 DIAGNOSIS — R001 Bradycardia, unspecified: Secondary | ICD-10-CM | POA: Diagnosis not present

## 2018-03-21 DIAGNOSIS — M4856XA Collapsed vertebra, not elsewhere classified, lumbar region, initial encounter for fracture: Secondary | ICD-10-CM | POA: Diagnosis not present

## 2018-03-21 DIAGNOSIS — D631 Anemia in chronic kidney disease: Secondary | ICD-10-CM | POA: Diagnosis not present

## 2018-03-21 DIAGNOSIS — K5900 Constipation, unspecified: Secondary | ICD-10-CM | POA: Diagnosis not present

## 2018-03-21 DIAGNOSIS — E1122 Type 2 diabetes mellitus with diabetic chronic kidney disease: Secondary | ICD-10-CM | POA: Diagnosis not present

## 2018-03-21 DIAGNOSIS — E11649 Type 2 diabetes mellitus with hypoglycemia without coma: Secondary | ICD-10-CM | POA: Diagnosis not present

## 2018-03-21 DIAGNOSIS — I454 Nonspecific intraventricular block: Secondary | ICD-10-CM | POA: Diagnosis not present

## 2018-03-21 DIAGNOSIS — S4982XA Other specified injuries of left shoulder and upper arm, initial encounter: Secondary | ICD-10-CM | POA: Diagnosis not present

## 2018-03-21 DIAGNOSIS — E162 Hypoglycemia, unspecified: Secondary | ICD-10-CM | POA: Diagnosis not present

## 2018-03-21 DIAGNOSIS — M109 Gout, unspecified: Secondary | ICD-10-CM | POA: Diagnosis not present

## 2018-03-21 DIAGNOSIS — J90 Pleural effusion, not elsewhere classified: Secondary | ICD-10-CM | POA: Diagnosis not present

## 2018-03-21 DIAGNOSIS — N183 Chronic kidney disease, stage 3 (moderate): Secondary | ICD-10-CM | POA: Diagnosis not present

## 2018-03-21 DIAGNOSIS — K220 Achalasia of cardia: Secondary | ICD-10-CM | POA: Diagnosis not present

## 2018-03-21 LAB — COMPREHENSIVE METABOLIC PANEL
ALK PHOS: 178 U/L — AB (ref 38–126)
ALT: 333 U/L — ABNORMAL HIGH (ref 17–63)
AST: 434 U/L — ABNORMAL HIGH (ref 15–41)
Albumin: 2.9 g/dL — ABNORMAL LOW (ref 3.5–5.0)
Anion gap: 9 (ref 5–15)
BUN: 87 mg/dL — ABNORMAL HIGH (ref 6–20)
CALCIUM: 8.6 mg/dL — AB (ref 8.9–10.3)
CO2: 21 mmol/L — AB (ref 22–32)
Chloride: 111 mmol/L (ref 101–111)
Creatinine, Ser: 2.21 mg/dL — ABNORMAL HIGH (ref 0.61–1.24)
GFR calc non Af Amer: 25 mL/min — ABNORMAL LOW (ref >60)
GFR, EST AFRICAN AMERICAN: 29 mL/min — AB (ref >60)
Glucose, Bld: 103 mg/dL — ABNORMAL HIGH (ref 65–99)
POTASSIUM: 5 mmol/L (ref 3.5–5.1)
SODIUM: 141 mmol/L (ref 135–145)
TOTAL PROTEIN: 6.3 g/dL — AB (ref 6.5–8.1)
Total Bilirubin: 0.4 mg/dL (ref 0.3–1.2)

## 2018-03-21 LAB — GLUCOSE, CAPILLARY
GLUCOSE-CAPILLARY: 202 mg/dL — AB (ref 65–99)
GLUCOSE-CAPILLARY: 178 mg/dL — AB (ref 65–99)
Glucose-Capillary: 112 mg/dL — ABNORMAL HIGH (ref 65–99)
Glucose-Capillary: 102 mg/dL — ABNORMAL HIGH (ref 65–99)
Glucose-Capillary: 88 mg/dL (ref 65–99)

## 2018-03-21 LAB — ACTH STIMULATION, 3 TIME POINTS
CORTISOL 30 MIN: 19.9 ug/dL
CORTISOL BASE: 12.3 ug/dL
Cortisol, 60 Min: 23.7 ug/dL

## 2018-03-21 LAB — HEPATITIS PANEL, ACUTE
HCV Ab: <0.1 s/co ratio (ref 0.0–0.9)
HEP B S AG: NEGATIVE
Hep A IgM: NEGATIVE
Hep B C IgM: NEGATIVE

## 2018-03-21 LAB — CORTISOL-AM, BLOOD: CORTISOL - AM: 19.8 ug/dL (ref 6.7–22.6)

## 2018-03-21 LAB — CBC
HCT: 28 % — ABNORMAL LOW (ref 39.0–52.0)
HEMOGLOBIN: 8.8 g/dL — AB (ref 13.0–17.0)
MCH: 27.8 pg (ref 26.0–34.0)
MCHC: 31.4 g/dL (ref 30.0–36.0)
MCV: 88.3 fL (ref 78.0–100.0)
Platelets: 93 10*3/uL — ABNORMAL LOW (ref 150–400)
RBC: 3.17 MIL/uL — AB (ref 4.22–5.81)
RDW: 17.1 % — ABNORMAL HIGH (ref 11.5–15.5)
WBC: 3.5 10*3/uL — ABNORMAL LOW (ref 4.0–10.5)

## 2018-03-21 MED ORDER — LATANOPROST 0.005 % OP SOLN
1.0000 [drp] | Freq: Every day | OPHTHALMIC | Status: DC
  Administered 2018-03-21 – 2018-03-22 (×2): 1 [drp] via OPHTHALMIC
  Filled 2018-03-21: qty 2.5

## 2018-03-21 MED ORDER — TRAMADOL HCL 50 MG PO TABS: 25.0000 mg | ORAL_TABLET | Freq: Two times a day (BID) | ORAL | Status: DC | PRN

## 2018-03-21 NOTE — Progress Notes (Signed)
Patient requires sign language for communication as well as the daughter of patient.  Daughter of patient requires meal retrieval via wheelchair by staff.

## 2018-03-21 NOTE — Evaluation (Signed)
Occupational Therapy Evaluation and Discharge Patient Details Name: Philip Matthews MRN: [id number] DOB: [birth year] Today's Date: 03/21/2018    History of Present Illness Pt is an 84 y.o. male admitted 03/20/18 after falling at home due to syncopal episode likely secondary to hypoglycemia. Xray L shoulder no acute abnormality, but indicative of arthritis and rotator cuff disease. Xray L humerus clear. Xray L-spine shows anterolisthesis L4-5 and age indeterminate L1 compression deformity. PMH includes deafness, HTN, DMII, CKD IV, gout.   Clinical Impression   PTA Pt mod independent in ADL/IADL and mobility with access to quad cane and RW as needed. Pt lives with daughter and her family and has 23 hour support available. Pt is at baseline for ADL today, able to don/doff shoes, perform toilet transfer and peri care as well as sink level grooming. Pt uses a shower chair at baseline for bathing. No further OT needs at this time. OT to sign off. Thank you for the opportunity to serve this patient and his family.     Follow Up Recommendations  No OT follow up    Equipment Recommendations  None recommended by OT(Pt has appropriate DME)    Recommendations for Other Services       Precautions / Restrictions Precautions Precautions: Fall Restrictions Weight Bearing Restrictions: No      Mobility Bed Mobility Overal bed mobility: Modified Independent             General bed mobility comments: HOB elevated  Transfers Overall transfer level: Needs assistance Equipment used: None;Rolling walker (2 wheeled) Transfers: Sit to/from Stand Sit to Stand: Supervision              Balance Overall balance assessment: Needs assistance   Sitting balance-Leahy Scale: Good Sitting balance - Comments: Indep to don shoes sitting EOB; indep with pericare on toilet   Standing balance support: Bilateral upper extremity supported;No upper extremity supported;During functional activity Standing  balance-Leahy Scale: Fair Standing balance comment: Stability much improved with BUE support                           ADL either performed or assessed with clinical judgement   ADL Overall ADL's : At baseline                                       General ADL Comments: able to ambulate to bathroom and toilet with RW (min cues for safety) sink level grooming, LB dressing     Vision Patient Visual Report: No change from baseline       Perception     Praxis      Pertinent Vitals/Pain Pain Assessment: Faces Faces Pain Scale: Hurts even more Pain Location: L shoulder Pain Descriptors / Indicators: Sore;Grimacing Pain Intervention(s): Monitored during session     Hand Dominance Right   Extremity/Trunk Assessment Upper Extremity Assessment Upper Extremity Assessment: LUE deficits/detail LUE Deficits / Details: pain from fall - functional for dressing, grooming, despite pain LUE Sensation: WNL LUE Coordination: decreased gross motor   Lower Extremity Assessment Lower Extremity Assessment: Overall WFL for tasks assessed       Communication Communication Communication: Interpreter utilized;Deaf(iPad interpreter used)   Cognition Arousal/Alertness: Awake/alert Behavior During Therapy: WFL for tasks assessed/performed Overall Cognitive Status: Within Functional Limits for tasks assessed  General Comments  Daughter present throughout session    Exercises     Shoulder Instructions      Home Living Family/patient expects to be discharged to:: Private residence Living Arrangements: Children;Other relatives Available Help at Discharge: Family;Available 24 hours/day Type of Home: House       Home Layout: One level     Bathroom Shower/Tub: Tub/shower unit;Curtain         Home Equipment: Environmental consultant - 2 wheels;Shower seat;Cane - quad          Prior Functioning/Environment Level of  Independence: Independent        Comments: Decreased confidence ambulating at night        OT Problem List:        OT Treatment/Interventions:      OT Goals(Current goals can be found in the care plan section) Acute Rehab OT Goals Patient Stated Goal: None stated  OT Frequency:     Barriers to D/C:            Co-evaluation              AM-PAC PT "6 Clicks" Daily Activity     Outcome Measure Help from another person eating meals?: None Help from another person taking care of personal grooming?: None Help from another person toileting, which includes using toliet, bedpan, or urinal?: None Help from another person bathing (including washing, rinsing, drying)?: None Help from another person to put on and taking off regular upper body clothing?: None Help from another person to put on and taking off regular lower body clothing?: None 6 Click Score: 24   End of Session Equipment Utilized During Treatment: Gait belt;Rolling walker Nurse Communication: Mobility status  Activity Tolerance: Patient tolerated treatment well Patient left: in bed;with call bell/phone within reach;with nursing/sitter in room;with family/visitor present                   Time: 6579-0383 OT Time Calculation (min): 20 min Charges:  OT General Charges $OT Visit: 1 Visit OT Evaluation $OT Eval Low Complexity: 1 Low G-Codes:     Hulda Humphrey OTR/L [phone]  Philip Matthews 03/21/2018, 11:12 AM

## 2018-03-21 NOTE — Discharge Summary (Signed)
[facility] Discharge Summary  Patient name: Philip Matthews Medical record number: [id number] Date of birth: 04-Dec-1928 Age: 84 y.o. Gender: male Date of Admission: 03/20/2018  Date of Discharge: 03/23/18 Admitting Physician: Leeanne Rio, MD  Primary Care Provider: Mayo, Pete Pelt, MD Consultants: cardiology  Indication for Hospitalization: syncope in setting of hypoglycemia (18)  Discharge Diagnoses/Problem List:  DM2 Intermittent bradycardia w/w bundle branch block Achalasia Rotator cuff injury left Age indeterminate L1 compression fracture Constipation Anemia CKD3 HTN Gout Deaf  Disposition: to home  Discharge Condition: stable  Discharge Exam: General: awake and alert, NAD, laying in bed  Cardiovascular: RRR, no MRG Respiratory: CTAB, no wheezes, rales, or rhonchi  Abdomen: soft, non tender, non distended, bowel sounds normal  Extremities: non tender, no edema    Brief Hospital Course:  Patient presented with sever hypoglycemia (18) and was treated by stopping all diabetes meds and supplementing glucose with prompt resolution.  He was left off of SSI and stayed largely under 200 during last 24hrs of admission and will be discharged on no DM medication as his prior regimen was so low already and we are concerned about dangerous hypoglycemia.    He was also found to have intermittent bradying down to 40's when still on metoprolol in setting of a bundle branch block but was assymptomatic.  We consulted Cardiology who d/c'd metoprolol and stated his prior syncope was almost definitely his hypoglycemia.  They are available if he needs further workup or new symptoms develop.  He had a mass on CXR in his mediastinum.  A followup CT showed sever achalasia which patient declined to have worked up because he says he has only minor problems swallowing.  Issues for Follow Up:  1. Concern for hypoglycemia with insulin, plan is to d/c without  insulin.  Patient can check glucose at home and call in if CBG >350. 2. Metoprolol discontinued due to low HR in setting of bundle branch block 3. Held Colchicine given kidney function 4. Mediastinal mass on CXR believed to be achalasia on followup CT, patient declined further workup.  Significant Procedures:   Significant Labs and Imaging:  Recent Labs  Lab 03/21/18 0710 03/22/18 0908 03/23/18 0629  WBC 3.5* 4.8 5.8  HGB 8.8* 8.6* 8.9*  HCT 28.0* 28.4* 28.4*  PLT 93* 68* 78*   Recent Labs  Lab 03/20/18 0631 03/20/18 1617 03/21/18 0710 03/22/18 0908 03/23/18 0629  NA 139 141 141 141 143  K 5.4* 5.0 5.0 4.3 4.6  CL 110 115* 111 113* 115*  CO2 19* 19* 21* 20* 22  GLUCOSE 186* 87 103* 241* 139*  BUN 112* 102* 87* 75* 62*  CREATININE 2.51* 2.21* 2.21* 1.95* 1.91*  CALCIUM 9.3 8.3* 8.6* 8.8* 9.0  ALKPHOS 138* 198* 178* 158* 148*  AST 146* 794* 434* 196* 109*  ALT 137* 438* 333* 229* 182*  ALBUMIN 3.5 3.0* 2.9* 2.9* 2.9*    Dg Lumbar Spine 2-3 Views  Result Date: 03/20/2018 CLINICAL DATA:  Fall EXAM: LUMBAR SPINE - 2-3 VIEW COMPARISON:  None. FINDINGS: Extensive vascular calcification. Surgical changes and coiled in the upper abdomen. Mild levoscoliosis of the lumbar spine. Grade 1 anterolisthesis of L4 on L5. Moderate degenerative changes L5-S1, L4-L5. Age indeterminate moderate diffuse increased bowel gas with formed stool in the rectum. Compression deformity at L1. IMPRESSION: 1. Grade 1 anterolisthesis of L4 on L5 2. Age indeterminate compression deformity at L1 3. Diffuse increased bowel gas with formed stool in the rectum,  query ileus. Electronically Signed   By: Donavan Foil M.D.   On: 03/20/2018 22:44   Dg Sacrum/coccyx  Result Date: 03/20/2018 CLINICAL DATA:  Fall EXAM: SACRUM AND COCCYX - 2+ VIEW COMPARISON:  None. FINDINGS: SI joints are non widened. Pubic symphysis and rami are intact. Inferior sacrum is obscured on the frontal view by stool in the rectum. No  definite acute osseous abnormality. IMPRESSION: Negative. Electronically Signed   By: Donavan Foil M.D.   On: 03/20/2018 22:45   Dg Chest Port 1 View  Result Date: 03/20/2018 CLINICAL DATA:  Hypoglycemia. EXAM: PORTABLE CHEST 1 VIEW COMPARISON:  None. FINDINGS: Deviation of the trachea to the right. Masslike appearance of the mediastinal contours may be due to tortuosity of the aortic arch, however mediastinal mass cannot be entirely excluded. The heart is mildly enlarged. Volume loss in the left hemithorax with elevation of the left hemidiaphragm. No evidence of lobar consolidation, pleural effusion or pneumothorax. Osseous structures are without acute abnormality. Soft tissues are grossly normal. IMPRESSION: Masslike appearance of the mediastinal contours. This may be due to tortuosity of the aortic arch, however mediastinal mass cannot be entirely excluded, especially given the deviation of the trachea to the right. Evaluation with CT of the chest with contrast may be considered, if found clinically warranted. No evidence of lobar airspace consolidation. Electronically Signed   By: Fidela Salisbury M.D.   On: 03/20/2018 10:20   Dg Shoulder Left  Result Date: 03/20/2018 CLINICAL DATA:  Fall with pain in the arm and shoulder EXAM: LEFT SHOULDER - 2+ VIEW COMPARISON:  None. FINDINGS: No fracture or dislocation. High-riding humeral head consistent with rotator cuff disease. AC joint degenerative change. Moderate glenohumeral degenerative change. Vascular calcifications in the left axilla. IMPRESSION: 1. No acute osseous abnormality 2. High-riding humeral head consistent with rotator cuff disease. Arthritis of the left shoulder. Electronically Signed   By: Donavan Foil M.D.   On: 03/20/2018 22:39   Dg Humerus Left  Result Date: 03/20/2018 CLINICAL DATA:  Fall with arm pain EXAM: LEFT HUMERUS - 2+ VIEW COMPARISON:  None. FINDINGS: No fracture or malalignment. Arthritis at the left shoulder. Vascular  calcification IMPRESSION: No acute osseous abnormality Electronically Signed   By: Donavan Foil M.D.   On: 03/20/2018 22:39    Results/Tests Pending at Time of Discharge:   Discharge Medications:  Allergies as of 03/23/2018   No Known Allergies     Medication List    STOP taking these medications   colchicine 0.6 MG tablet   insulin glargine 100 unit/mL Sopn Commonly known as:  LANTUS   insulin lispro 100 UNIT/ML KiwkPen Commonly known as:  HUMALOG KWIKPEN   Insulin Pen Needle 32G X 6 MM Misc Commonly known as:  NOVOFINE   metoprolol succinate 25 MG 24 hr tablet Commonly known as:  TOPROL-XL     TAKE these medications   acetaminophen 500 MG tablet Commonly known as:  TYLENOL Take 500 mg by mouth every 6 (six) hours as needed (pain). What changed:  Another medication with the same name was removed. Continue taking this medication, and follow the directions you see here.   allopurinol 100 MG tablet Commonly known as:  ZYLOPRIM take 1 tablet by mouth once daily What changed:    how much to take  how to take this  when to take this   calcitRIOL 0.25 MCG capsule Commonly known as:  ROCALTROL Take 1 capsule (0.25 mcg total) by mouth every Monday, Wednesday, and Friday.  Calcium 500-125 MG-UNIT Tabs Take 1 tablet by mouth 2 (two) times daily. Over the counter   calcium carbonate 1250 (500 Ca) MG tablet Commonly known as:  OS-CAL - dosed in mg of elemental calcium Take 1 tablet by mouth 2 (two) times daily with a meal.   COMBIGAN 0.2-0.5 % ophthalmic solution Generic drug:  brimonidine-timolol Place 1 drop into both eyes every 12 (twelve) hours.   docusate sodium 100 MG capsule Commonly known as:  COLACE Take 100 mg by mouth 2 (two) times daily as needed (constipation). Over the counter   epoetin alfa 10000 UNIT/ML injection Commonly known as:  PROCRIT Inject 1 mL (10,000 Units total) into the skin 3 (three) times a week. What changed:  when to take this    feeding supplement Liqd Take 1 Container by mouth 3 (three) times daily between meals.   FERRO-BOB 325 (65 FE) MG tablet Generic drug:  ferrous sulfate Take 325 mg by mouth daily. Over the counter   furosemide 40 MG tablet Commonly known as:  LASIX take 1 tablet (71m) by mouth once daily   glucose blood test strip Commonly known as:  ONE TOUCH ULTRA TEST Check sugar up to 6 x daily   hydrALAZINE 50 MG tablet Commonly known as:  APRESOLINE take 1 tablet by mouth three times a day What changed:    how much to take  how to take this  when to take this   latanoprost 0.005 % ophthalmic solution Commonly known as:  XALATAN Place 1 drop into both eyes at bedtime.   levothyroxine 75 MCG tablet Commonly known as:  SYNTHROID, LEVOTHROID take 1 tablet by mouth once daily What changed:  Another medication with the same name was removed. Continue taking this medication, and follow the directions you see here.   multivitamin with minerals Tabs tablet Take 1 tablet by mouth daily. Over the counter   ONE TOUCH ULTRA 2 w/Device Kit 1 Device by Does not apply route once.   ONETOUCH DELICA LANCETS FINE Misc 1 each by Does not apply route every morning.   onetouch ultrasoft lancets Check sugar up to 6 x daily   pantoprazole 40 MG tablet Commonly known as:  PROTONIX take 1 tablet by mouth twice a day What changed:  Another medication with the same name was removed. Continue taking this medication, and follow the directions you see here.   polyethylene glycol powder powder Commonly known as:  GLYCOLAX/MIRALAX Take 17 g by mouth 2 (two) times daily. Hold for loose stools   pravastatin 40 MG tablet Commonly known as:  PRAVACHOL take 1 tablet by mouth once daily What changed:    how much to take  how to take this  when to take this   sodium bicarbonate 650 MG tablet Take 1 tablet (650 mg total) by mouth 2 (two) times daily.   sodium polystyrene 15 GM/60ML  suspension Commonly known as:  KAYEXALATE Take 60 mLs (15 g total) by mouth once. 3x weekly (tu/th/sat) to prevent high potassium. What changed:    when to take this  additional instructions   timolol 0.5 % ophthalmic solution Commonly known as:  TIMOPTIC Place 1 drop into both eyes as directed.   vitamin B-12 100 MCG tablet Commonly known as:  CYANOCOBALAMIN Take 100 mcg by mouth daily. Over the counter            Durable Medical Equipment  (From admission, onward)        Start  Ordered   03/21/18 1213  For home use only DME Walker rolling  Once    Question Answer Comment  Patient needs a walker to treat with the following condition Gait abnormality   Patient needs a walker to treat with the following condition Risk for falls      03/21/18 1213      Discharge Instructions: Please refer to Patient Instructions section of EMR for full details.  Patient was counseled important signs and symptoms that should prompt return to medical care, changes in medications, dietary instructions, activity restrictions, and follow up appointments.   Follow-Up Appointments: Follow-up Information    Mayo, Pete Pelt, MD. Go on 03/26/2018.   Specialty:  Family Medicine Why:  2:30pm Contact information: [place] [phone]           Sherene Sires, DO 03/23/2018, 12:57 PM PGY-1, [place]

## 2018-03-21 NOTE — Evaluation (Addendum)
Physical Therapy Evaluation Patient Details Name: Philip Matthews MRN: [id number] DOB: 07/15/1928 Today's Date: 03/21/2018   History of Present Illness  Pt is an 84 y.o. male admitted 03/20/18 after falling at home due to syncopal episode likely secondary to hypoglycemia. Xray L shoulder no acute abnormality, but indicative of arthritis and rotator cuff disease. Xray L humerus clear. Xray L-spine shows anterolisthesis L4-5 and age indeterminate L1 compression deformity. PMH includes deafness, HTN, DMII, CKD IV, gout.    Clinical Impression  Pt presents with an overall decrease in functional mobility secondary to above. PTA, pt indep with ambulation and ADLs; lives with supportive family who reports he manages very well for his age. Today, pt's stability improved ambulating with RW versus no UE support. Educ on fall risk reduction and importance of RW use. Pt would benefit from continued acute PT services to maximize functional mobility and independence prior to d/c home. Ipad video interpreter utilized for ASL    Follow Up Recommendations No PT follow up;Supervision/Assistance - 24 hour    Equipment Recommendations  Rolling walker with 5" wheels    Recommendations for Other Services       Precautions / Restrictions Precautions Precautions: Fall Restrictions Weight Bearing Restrictions: No      Mobility  Bed Mobility Overal bed mobility: Modified Independent             General bed mobility comments: HOB elevated  Transfers Overall transfer level: Needs assistance Equipment used: None;Rolling walker (2 wheeled) Transfers: Sit to/from Stand Sit to Stand: Supervision            Ambulation/Gait Ambulation/Gait assistance: Supervision;Min guard Ambulation Distance (Feet): 100 Feet Assistive device: None;Rolling walker (2 wheeled) Gait Pattern/deviations: Step-through pattern;Decreased stride length;Trunk flexed Gait velocity: Decreased Gait velocity interpretation:  <1.8 ft/sec, indicative of risk for recurrent falls General Gait Details: Unsteady amb with no DME, requiring min guard for balance. Pt requesting use of RW for stability; able to amb additional 100' with RW and supervision for safety. Pt asymptomatic throughout  Stairs            Wheelchair Mobility    Modified Rankin (Stroke Patients Only)       Balance Overall balance assessment: Needs assistance   Sitting balance-Leahy Scale: Good Sitting balance - Comments: Indep to don shoes sitting EOB; indep with pericare on toilet   Standing balance support: Bilateral upper extremity supported;No upper extremity supported;During functional activity Standing balance-Leahy Scale: Fair Standing balance comment: Stability much improved with BUE support                             Pertinent Vitals/Pain Pain Assessment: Faces Faces Pain Scale: Hurts even more Pain Location: L shoulder Pain Descriptors / Indicators: Sore;Grimacing Pain Intervention(s): Monitored during session    Home Living Family/patient expects to be discharged to:: Private residence Living Arrangements: Children;Other relatives Available Help at Discharge: Family;Available 24 hours/day Type of Home: House       Home Layout: One level Home Equipment: [place] - 2 wheels;Cane - single point;Shower seat      Prior Function Level of Independence: Independent         Comments: Decreased confidence ambulating at night     Hand Dominance        Extremity/Trunk Assessment   Upper Extremity Assessment Upper Extremity Assessment: Defer to OT evaluation    Lower Extremity Assessment Lower Extremity Assessment: Overall WFL for tasks assessed  Communication   Communication: Interpreter utilized;Deaf(Ipad interpreter used for ASL)  Cognition Arousal/Alertness: Awake/alert Behavior During Therapy: WFL for tasks assessed/performed Overall Cognitive Status: Within Functional Limits for  tasks assessed                                        General Comments General comments (skin integrity, edema, etc.): Daughter present throughout session.    Exercises     Assessment/Plan    PT Assessment Patient needs continued PT services  PT Problem List Decreased activity tolerance;Decreased balance;Decreased mobility;Decreased knowledge of use of DME       PT Treatment Interventions DME instruction;Gait training;Stair training;Functional mobility training;Therapeutic activities;Therapeutic exercise;Balance training;Patient/family education    PT Goals (Current goals can be found in the Care Plan section)  Acute Rehab PT Goals Patient Stated Goal: None stated PT Goal Formulation: With patient/family Time For Goal Achievement: 04/04/18    Frequency Min 3X/week   Barriers to discharge        Co-evaluation               AM-PAC PT "6 Clicks" Daily Activity  Outcome Measure Difficulty turning over in bed (including adjusting bedclothes, sheets and blankets)?: None Difficulty moving from lying on back to sitting on the side of the bed? : None Difficulty sitting down on and standing up from a chair with arms (e.g., wheelchair, bedside commode, etc,.)?: None Help needed moving to and from a bed to chair (including a wheelchair)?: A Little Help needed walking in hospital room?: A Little Help needed climbing 3-5 steps with a railing? : A Little 6 Click Score: 21    End of Session Equipment Utilized During Treatment: Gait belt Activity Tolerance: Patient tolerated treatment well Patient left: with family/visitor present(washing hands at sink with OT) Nurse Communication: Mobility status PT Visit Diagnosis: Other abnormalities of gait and mobility (R26.89)    Time: 4315-4008 PT Time Calculation (min) (ACUTE ONLY): 23 min   Charges:   PT Evaluation $PT Eval Moderate Complexity: 1 Mod PT Treatments $Gait Training: 8-22 mins   PT G Codes:        Mabeline Caras, PT, DPT Acute Rehab Services  Pager: [place] 03/21/2018, 10:50 AM

## 2018-03-21 NOTE — Progress Notes (Signed)
Patient placed on low bed with mats to each side of the floor as fall protection.

## 2018-03-21 NOTE — Progress Notes (Signed)
Dr. Criss Rosales notified that tele reported pt in 2 degree heartblock with heart rate in the 40's which is a change from being in the 60's.  Pt asymptomatic of any discomfort, changes.

## 2018-03-21 NOTE — Consult Note (Signed)
CARDIOLOGY CONSULT NOTE       Patient ID: Philip Matthews MRN: [id number] DOB/AGE: 1928/07/19 84 y.o.  Admit date: 03/20/2018 Referring Physician: Ardelia Mems Primary Physician: [name], MD Primary Cardiologist: [name] Reason for Consultation: Heart Block  Active Problems:   Syncope   Hypoglycemia   HPI:  84 y.o. deaf black male Last seen by cardiology 2014 History of HTN and long first degree AV block in 270 msec range. Had normal dobutamine echo at that time to clear for rotator cuff surgery. Previous ETOH abuse CRF;s HLD, HTN, and DM-2. Admitted with fall/syncope due to severe hypoglycemia BS 18 after taking his medications inappropriately No chest pain palpitations. Despite long standing AV nodal disease has not had high grade heart block or syncope outside setting of current hypoglycemia admission. No chest pain or history of CAD. Has improved with hydration and management of low BS. Telemetry reviewed and shows baseline SR with first degree. Periods of asymptomatic 3:2 Wenkebach. No high grade heart block or below HIS block  ROS All other systems reviewed and negative except as noted above  Past Medical History:  Diagnosis Date  . Anemia   . Arthritis    "joints" (08/27/2017)  . ARTHRITIS, LEFT SHOULDER 02/01/2010   Qualifier: Diagnosis of  By: Jacques Earthly    . Chronic kidney disease (CKD), stage IV (severe) ([place])   . Deaf   . Diverticulosis   . DIVERTICULOSIS OF COLON 03/08/2010   Qualifier: Diagnosis of  By: Jacques Earthly    . Esophagitis    HSV  . Gout   . History of DVT (deep vein thrombosis)   . Hyperlipidemia   . Hypertension   . Hypothyroidism   . Kidney mass    lipoma  . Liver nodule    u/s 310  . Portal vein thrombosis    on life long coumadin  . PUD, HX OF 03/08/2010   Qualifier: History of  By: Jacques Earthly    . Type II diabetes mellitus (H[facility])     Family History  Problem Relation Age of Onset  . Cancer Father        prostate  .  Heart disease Mother     Social History   Socioeconomic History  . Marital status: Divorced    Spouse name: Not on file  . Number of children: Not on file  . Years of education: Not on file  . Highest education level: Not on file  Occupational History  . Not on file  Social Needs  . Financial resource strain: Not on file  . Food insecurity:    Worry: Not on file    Inability: Not on file  . Transportation needs:    Medical: Not on file    Non-medical: Not on file  Tobacco Use  . Smoking status: Former Smoker    Types: Cigarettes    Last attempt to quit: 01/18/2011    Years since quitting: 7.1  . Smokeless tobacco: Never Used  . Tobacco comment: 08/27/2017 "didn't smoke alot; can't tell you how long I smoked"  Substance and Sexual Activity  . Alcohol use: No  . Drug use: No  . Sexual activity: Not on file  Lifestyle  . Physical activity:    Days per week: Not on file    Minutes per session: Not on file  . Stress: Not on file  Relationships  . Social connections:    Talks on phone: Not on file  Gets together: Not on file    Attends religious service: Not on file    Active member of club or organization: Not on file    Attends meetings of clubs or organizations: Not on file    Relationship status: Not on file  . Intimate partner violence:    Fear of current or ex partner: Not on file    Emotionally abused: Not on file    Physically abused: Not on file    Forced sexual activity: Not on file  Other Topics Concern  . Not on file  Social History Narrative  . Not on file    Past Surgical History:  Procedure Laterality Date  . COLON SURGERY     "something was twisted; that messed w/my BM; had to have it untwisted"  . HERNIA REPAIR    . LIVER LOBECTOMY    . SHOULDER ARTHROCENTESIS Left      . allopurinol  100 mg Oral Daily  . enoxaparin (LOVENOX) injection  30 mg Subcutaneous Q24H  . ferrous sulfate  325 mg Oral Daily  . latanoprost  1 drop Both Eyes QHS  .  levothyroxine  75 mcg Oral QAC breakfast  . pantoprazole  40 mg Oral BID  . polyethylene glycol  17 g Oral BID  . sodium bicarbonate  650 mg Oral BID  . timolol  1 drop Both Eyes BID  . vitamin B-12  100 mcg Oral Daily     Physical Exam: Blood pressure (!) 127/41, pulse 78, temperature 99.3 F (37.4 C), temperature source Oral, resp. rate 18, weight 122 lb 3.2 oz (55.4 kg), SpO2 100 %.    Affect appropriate Chronically ill elderly black male  HEENT: Deaf  Neck supple with no adenopathy JVP normal no bruits no thyromegaly Lungs clear with no wheezing and good diaphragmatic motion Heart:  S1/S2 no murmur, no rub, gallop or click PMI normal Abdomen: benighn, BS positve, no tenderness, no AAA no bruit.  No HSM or HJR Distal pulses intact with no bruits No edema Neuro non-focal Skin warm and dry Left shoulder sore from fall    Labs:   Lab Results  Component Value Date   WBC 3.5 (L) 03/21/2018   HGB 8.8 (L) 03/21/2018   HCT 28.0 (L) 03/21/2018   MCV 88.3 03/21/2018   PLT 93 (L) 03/21/2018    Recent Labs  Lab 03/21/18 0710  NA 141  K 5.0  CL 111  CO2 21*  BUN 87*  CREATININE 2.21*  CALCIUM 8.6*  PROT 6.3*  BILITOT 0.4  ALKPHOS 178*  ALT 333*  AST 434*  GLUCOSE 103*   Lab Results  Component Value Date   TROPONINI <0.03 03/20/2018    Lab Results  Component Value Date   CHOL 101 (L) 10/23/2016   CHOL 109 05/18/2014   CHOL 94 06/01/2013   Lab Results  Component Value Date   HDL 46 10/23/2016   HDL 50 05/18/2014   HDL 39 (L) 06/01/2013   Lab Results  Component Value Date   LDLCALC 36 10/23/2016   LDLCALC 38 05/18/2014   LDLCALC 37 06/01/2013   Lab Results  Component Value Date   TRIG 97 10/23/2016   TRIG 107 05/18/2014   TRIG 88 06/01/2013   Lab Results  Component Value Date   CHOLHDL 2.2 10/23/2016   CHOLHDL 2.2 05/18/2014   CHOLHDL 2.4 06/01/2013   Lab Results  Component Value Date   LDLDIRECT 41 01/18/2011      Radiology: Dg  Lumbar Spine 2-3 Views  Result Date: 03/20/2018 CLINICAL DATA:  Fall EXAM: LUMBAR SPINE - 2-3 VIEW COMPARISON:  None. FINDINGS: Extensive vascular calcification. Surgical changes and coiled in the upper abdomen. Mild levoscoliosis of the lumbar spine. Grade 1 anterolisthesis of L4 on L5. Moderate degenerative changes L5-S1, L4-L5. Age indeterminate moderate diffuse increased bowel gas with formed stool in the rectum. Compression deformity at L1. IMPRESSION: 1. Grade 1 anterolisthesis of L4 on L5 2. Age indeterminate compression deformity at L1 3. Diffuse increased bowel gas with formed stool in the rectum, query ileus. Electronically Signed   By: Donavan Foil M.D.   On: 03/20/2018 22:44   Dg Sacrum/coccyx  Result Date: 03/20/2018 CLINICAL DATA:  Fall EXAM: SACRUM AND COCCYX - 2+ VIEW COMPARISON:  None. FINDINGS: SI joints are non widened. Pubic symphysis and rami are intact. Inferior sacrum is obscured on the frontal view by stool in the rectum. No definite acute osseous abnormality. IMPRESSION: Negative. Electronically Signed   By: Donavan Foil M.D.   On: 03/20/2018 22:45   Dg Chest Port 1 View  Result Date: 03/20/2018 CLINICAL DATA:  Hypoglycemia. EXAM: PORTABLE CHEST 1 VIEW COMPARISON:  None. FINDINGS: Deviation of the trachea to the right. Masslike appearance of the mediastinal contours may be due to tortuosity of the aortic arch, however mediastinal mass cannot be entirely excluded. The heart is mildly enlarged. Volume loss in the left hemithorax with elevation of the left hemidiaphragm. No evidence of lobar consolidation, pleural effusion or pneumothorax. Osseous structures are without acute abnormality. Soft tissues are grossly normal. IMPRESSION: Masslike appearance of the mediastinal contours. This may be due to tortuosity of the aortic arch, however mediastinal mass cannot be entirely excluded, especially given the deviation of the trachea to the right. Evaluation with CT of the chest with  contrast may be considered, if found clinically warranted. No evidence of lobar airspace consolidation. Electronically Signed   By: Fidela Salisbury M.D.   On: 03/20/2018 10:20   Dg Shoulder Left  Result Date: 03/20/2018 CLINICAL DATA:  Fall with pain in the arm and shoulder EXAM: LEFT SHOULDER - 2+ VIEW COMPARISON:  None. FINDINGS: No fracture or dislocation. High-riding humeral head consistent with rotator cuff disease. AC joint degenerative change. Moderate glenohumeral degenerative change. Vascular calcifications in the left axilla. IMPRESSION: 1. No acute osseous abnormality 2. High-riding humeral head consistent with rotator cuff disease. Arthritis of the left shoulder. Electronically Signed   By: Donavan Foil M.D.   On: 03/20/2018 22:39   Dg Humerus Left  Result Date: 03/20/2018 CLINICAL DATA:  Fall with arm pain EXAM: LEFT HUMERUS - 2+ VIEW COMPARISON:  None. FINDINGS: No fracture or malalignment. Arthritis at the left shoulder. Vascular calcification IMPRESSION: No acute osseous abnormality Electronically Signed   By: Donavan Foil M.D.   On: 03/20/2018 22:39    EKG: SR/Sinus Arrhythmia with first degree. Telemetry with periods of 3:2 wenkebach   ASSESSMENT AND PLAN:   Bradycardia:  Not related to admission. Long standing AV nodal disease mostly with first degree. Now periods of asymptomatic wenkebach. No below HIS or symptomatic bradycardia. Avoid AV nodal blocking drugs. If he has syncope unrelated to hypoglycemia can consider 30 day event monitor and EP referral    DM:  Needs more education regarding taking meds Instruction hindered by being deaf  HTN:  Well controlled.  Continue current medications and low sodium Dash type diet.     OK to D/C home from cardiology stand point  SignedJenkins Rouge 03/21/2018, 3:40 PM

## 2018-03-21 NOTE — Progress Notes (Signed)
Family Medicine Teaching Service Daily Progress Note Intern Pager: [phone]  Patient name: Philip Matthews Medical record number: [id number] Date of birth: 1928-11-11 Age: 84 y.o. Gender: male  Primary Care Provider: Mayo, Pete Pelt, MD Consultants:  Code Status: full  *needs sign language interpretor Pt Overview and Major Events to Date:  Philip Matthews is a 84 y.o. male presenting with syncope . PMH is significant for the osteoarthritis anemia, hyperkalemia, hypothyroidism, gout, type 2 diabetes, hypertension,  Assessment and Plan: Philip Matthews is a 84 y.o. male presenting with syncope . PMH is significant for the osteoarthritis anemia, hyperkalemia, hypothyroidism, gout, type 2 diabetes, hypertension,  Syncopal Episode likely 2/2 hypoglycemia: likely metabolic due to hypoglycemia as patient was found down with a CBG of 18.  Patient denies any symptoms of chest pain or shortness of breath making cardiac unlikely though notable  EKG with T- wave inversions in Lead 2,3 and AVF, troponin i-STAT 0.   Normal neurologic exam on admission. Trop negx3.  Echo with 60-65%EF and mitral calcification. CXR with potential mediastinal mass but no acute process.  Alcohol/uring tox neg - Will stop basal insulin and likely not D/C on insulin - Blood cultures pending  - PT/OT consult   Mediastnioal mass- seen onCXR -chest ct w/o contrast  Shoulder pain- rotator cuff on XR -change tylenol to tramadol due to transaminitis  Age indeterminate L1 compression fracture- patient not complaining of pain -will do specific lumbar exam  Constipation: potential ileus seen on imaging.  Patient states they move bowels 2-3x day and have no pain/bleeding/strain.  They do take miralax at home -continue miralax  Transaminates: acutely raised on 3/22 labs but recovering am 3/23. Could be shock liverNotable for hx of transaminates noted on the problem lisit possibly due to fatty liver disease, previous ultrasound in  2012 notable diffuse fatty infiltrates. Denies any abdominal pain, nausea or vomiting. Hep panel neg.  INR 0.92 -  Trend CMET's   Hypothermia: Temp WNL overnight 3/22, hx of low temps per daughter over the past month when home nurse comes by to check on patient. Given patient's hx of hyperkalemia, hypothermia, and presenting with hypoglycemia, would perform ACTH stimulation test to rule out adrenal insuffiency.  ACTH stim test WNL. -  Blood cultures pending  Anemia:Hgb 8.8 AM 3/23; baseline hemoglobin 10. Likely due to CKD, currently on Alfa Epo. Hx of colonoscopy in 2011 with diverticulosis of colon.  Acute drop from 10 may be dilutional after IVF. -  Continue to follow  - Continue iron supplement   AoCKD stage 3: Baseline SCr 2.0 since 2018, SCr 2.51. Received a 1 Liter bolus down in the ED  - Encourage Fluid intake - Hold Lasix for now   - Continue to follow   Hyperkalemia: Resolved to 5.0 from 5.4 on 3/23. No peaked T-waves. S/p katyexalate -daily BMP  T2DM: Hgb A1C 7.0 this admission from 6.8 prior. Home regiment includes Lantus 4 units and Humalog 3 units TID with meals  - Sensitive sliding scale  -will hold basal insulin and likely d/c without insulin due to hypoglycemic risk  Hypertension: Normotensive  - Hold hydralazine and lasix for now  - Continue metoprolol XR   Hypothyroidism TSH slightly low. - Continue Synthroid   Gout  - Holding colcihine given kidney function  - Continue Allopurinol   FEN/GI: Carb modified  Prophylaxis: Lovenox   Disposition: Home    Subjective:  No physical complaints beyond shoulder pain which is mild and chronic  Objective: Temp:  [98.4 F (36.9 C)-99.9 F (37.7 C)] 98.4 F (36.9 C) (03/23 0718) Pulse Rate:  [68-90] 68 (03/23 0718) Resp:  [17-21] 18 (03/23 0718) BP: (103-154)/(59-72) 154/63 (03/23 0718) SpO2:  [96 %-100 %] 100 % (03/23 0718) Weight:  [122 lb 3.2 oz (55.4 kg)] 122 lb 3.2 oz (55.4 kg) (03/23  0603) Physical Exam: Constitutional: well-developed, well-nourished, and in no distress.  Head: Normocephalic and atraumatic.  Eyes: Pupils are equal, round, and reactive to light. Conjunctivae are normal.  Neck: Normal range of motion. Neck supple.  Cardiovascular: Normal rate, regular rhythm and normal heart sounds.  Pulmonary/Chest: Effort normal and breath sounds normal.  Abdominal: Soft. no pain on deep palpation Musculoskeletal: Normal range of motion.  Neurological: He is alert and oriented.  Able to discuss his care through interpretor. No cranial nerve deficit. Coordination normal.  Skin: Skin is warm.     Laboratory: Recent Labs  Lab 03/20/18 0631 03/21/18 0710  WBC 4.2 3.5*  HGB 10.1* 8.8*  HCT 33.3* 28.0*  PLT 93* 93*   Recent Labs  Lab 03/20/18 0631 03/20/18 1617 03/21/18 0710  NA 139 141 141  K 5.4* 5.0 5.0  CL 110 115* 111  CO2 19* 19* 21*  BUN 112* 102* 87*  CREATININE 2.51* 2.21* 2.21*  CALCIUM 9.3 8.3* 8.6*  PROT 6.8 5.9* 6.3*  BILITOT 0.1* 0.3 0.4  ALKPHOS 138* 198* 178*  ALT 137* 438* 333*  AST 146* 794* 434*  GLUCOSE 186* 87 103*    Imaging/Diagnostic Tests: Dg Lumbar Spine 2-3 Views  Result Date: 03/20/2018 CLINICAL DATA:  Fall EXAM: LUMBAR SPINE - 2-3 VIEW COMPARISON:  None. FINDINGS: Extensive vascular calcification. Surgical changes and coiled in the upper abdomen. Mild levoscoliosis of the lumbar spine. Grade 1 anterolisthesis of L4 on L5. Moderate degenerative changes L5-S1, L4-L5. Age indeterminate moderate diffuse increased bowel gas with formed stool in the rectum. Compression deformity at L1. IMPRESSION: 1. Grade 1 anterolisthesis of L4 on L5 2. Age indeterminate compression deformity at L1 3. Diffuse increased bowel gas with formed stool in the rectum, query ileus. Electronically Signed   By: Donavan Foil M.D.   On: 03/20/2018 22:44   Dg Sacrum/coccyx  Result Date: 03/20/2018 CLINICAL DATA:  Fall EXAM: SACRUM AND COCCYX - 2+ VIEW  COMPARISON:  None. FINDINGS: SI joints are non widened. Pubic symphysis and rami are intact. Inferior sacrum is obscured on the frontal view by stool in the rectum. No definite acute osseous abnormality. IMPRESSION: Negative. Electronically Signed   By: Donavan Foil M.D.   On: 03/20/2018 22:45   Dg Chest Port 1 View  Result Date: 03/20/2018 CLINICAL DATA:  Hypoglycemia. EXAM: PORTABLE CHEST 1 VIEW COMPARISON:  None. FINDINGS: Deviation of the trachea to the right. Masslike appearance of the mediastinal contours may be due to tortuosity of the aortic arch, however mediastinal mass cannot be entirely excluded. The heart is mildly enlarged. Volume loss in the left hemithorax with elevation of the left hemidiaphragm. No evidence of lobar consolidation, pleural effusion or pneumothorax. Osseous structures are without acute abnormality. Soft tissues are grossly normal. IMPRESSION: Masslike appearance of the mediastinal contours. This may be due to tortuosity of the aortic arch, however mediastinal mass cannot be entirely excluded, especially given the deviation of the trachea to the right. Evaluation with CT of the chest with contrast may be considered, if found clinically warranted. No evidence of lobar airspace consolidation. Electronically Signed   By: Linwood Dibbles.D.  On: 03/20/2018 10:20   Dg Shoulder Left  Result Date: 03/20/2018 CLINICAL DATA:  Fall with pain in the arm and shoulder EXAM: LEFT SHOULDER - 2+ VIEW COMPARISON:  None. FINDINGS: No fracture or dislocation. High-riding humeral head consistent with rotator cuff disease. AC joint degenerative change. Moderate glenohumeral degenerative change. Vascular calcifications in the left axilla. IMPRESSION: 1. No acute osseous abnormality 2. High-riding humeral head consistent with rotator cuff disease. Arthritis of the left shoulder. Electronically Signed   By: Donavan Foil M.D.   On: 03/20/2018 22:39   Dg Humerus Left  Result Date:  03/20/2018 CLINICAL DATA:  Fall with arm pain EXAM: LEFT HUMERUS - 2+ VIEW COMPARISON:  None. FINDINGS: No fracture or malalignment. Arthritis at the left shoulder. Vascular calcification IMPRESSION: No acute osseous abnormality Electronically Signed   By: Donavan Foil M.D.   On: 03/20/2018 22:39     Sherene Sires, DO 03/21/2018, 9:44 AM PGY-1, [facility] Intern pager: [phone], text pages welcome

## 2018-03-21 NOTE — Progress Notes (Signed)
Family Medicine MD on call informed that patient rhythm changed to 2n degree heart block patient was asleep asymptomatic. Strip saved and one is printed in the chart for review. Will continue to monitor. Arthor Captain LPN

## 2018-03-22 DIAGNOSIS — E11649 Type 2 diabetes mellitus with hypoglycemia without coma: Secondary | ICD-10-CM | POA: Diagnosis not present

## 2018-03-22 LAB — COMPREHENSIVE METABOLIC PANEL
ALBUMIN: 2.9 g/dL — AB (ref 3.5–5.0)
ALK PHOS: 158 U/L — AB (ref 38–126)
ALT: 229 U/L — AB (ref 17–63)
AST: 196 U/L — ABNORMAL HIGH (ref 15–41)
Anion gap: 8 (ref 5–15)
BUN: 75 mg/dL — ABNORMAL HIGH (ref 6–20)
CALCIUM: 8.8 mg/dL — AB (ref 8.9–10.3)
CO2: 20 mmol/L — AB (ref 22–32)
CREATININE: 1.95 mg/dL — AB (ref 0.61–1.24)
Chloride: 113 mmol/L — ABNORMAL HIGH (ref 101–111)
GFR calc non Af Amer: 29 mL/min — ABNORMAL LOW (ref >60)
GFR, EST AFRICAN AMERICAN: 34 mL/min — AB (ref >60)
Glucose, Bld: 241 mg/dL — ABNORMAL HIGH (ref 65–99)
Potassium: 4.3 mmol/L (ref 3.5–5.1)
SODIUM: 141 mmol/L (ref 135–145)
Total Bilirubin: 0.4 mg/dL (ref 0.3–1.2)
Total Protein: 6.3 g/dL — ABNORMAL LOW (ref 6.5–8.1)

## 2018-03-22 LAB — CBC
HCT: 28.4 % — ABNORMAL LOW (ref 39.0–52.0)
HEMOGLOBIN: 8.6 g/dL — AB (ref 13.0–17.0)
MCH: 26.6 pg (ref 26.0–34.0)
MCHC: 30.3 g/dL (ref 30.0–36.0)
MCV: 87.9 fL (ref 78.0–100.0)
Platelets: 68 10*3/uL — ABNORMAL LOW (ref 150–400)
RBC: 3.23 MIL/uL — ABNORMAL LOW (ref 4.22–5.81)
RDW: 16.9 % — ABNORMAL HIGH (ref 11.5–15.5)
WBC: 4.8 10*3/uL (ref 4.0–10.5)

## 2018-03-22 LAB — GLUCOSE, CAPILLARY
GLUCOSE-CAPILLARY: 202 mg/dL — AB (ref 65–99)
GLUCOSE-CAPILLARY: 218 mg/dL — AB (ref 65–99)
Glucose-Capillary: 123 mg/dL — ABNORMAL HIGH (ref 65–99)
Glucose-Capillary: 160 mg/dL — ABNORMAL HIGH (ref 65–99)

## 2018-03-22 MED ORDER — HYDRALAZINE HCL 50 MG PO TABS
50.0000 mg | ORAL_TABLET | Freq: Three times a day (TID) | ORAL | Status: DC
  Administered 2018-03-22 – 2018-03-23 (×4): 50 mg via ORAL
  Filled 2018-03-22 (×4): qty 1

## 2018-03-22 NOTE — Progress Notes (Signed)
Started to care of the patient since 11am, report taken from RN, this time pt is resting comfortably in bed, talked with his daughter via interpretator, will continue to monitor the patient  Palma Holter, Therapist, sports

## 2018-03-22 NOTE — Progress Notes (Signed)
Family Medicine Teaching Service Daily Progress Note Intern Pager: [phone]  Patient name: Philip Matthews Medical record number: [id number] Date of birth: 1928/05/07 Age: 84 y.o. Gender: male  Primary Care Provider: Mayo, Pete Pelt, MD Consultants: Cardiology  Code Status: Full   Pt Overview and Major Events to Date:  Admitted to [facility] on 03/20/18 for syncopal workup   Assessment and Plan: [name] a 84 y.o.malepresenting with syncope. PMH is significant for theosteoarthritis anemia, hyperkalemia, hypothyroidism, gout, type 2 diabetes, hypertension,  Syncopal Episode likely 2/2 hypoglycemia No further episodes. Found down with CBG of 18 on admission. Possibly cardiac given history of AV node block, however cardiology consulted and don't believe bradycardia is related to admission. Echo with EF of 60-65% and mitral calcification. Blood cx neg x 1 day - discontinue basal insulin, will likely not recommend continuing on discharge  - PT/OT consult: no follow up needed  H/o First degree AV block Patient with long first degree AV block in 29msec range. Periods of asymptomatic 3:2 Wenkebach seen on telemetry. Episodes of bradycardia during admission.  -avoid AV blocking medications  -cardiology consulted appreciate recommendations: can consider 30day event monitor and EP referral if syncope w/out hypoglycemia noted, stable for discharge   Mediastnioal mass Seen on CXR. CT chest showing likely achalasia and chronic esophagitis.  -can consider GI consult  -SLP swallow eval   Shoulder pain Rotator cuff on XR -continue tramadol due to transaminitis  Age indeterminate L1 compression fracture Patient not complaining of pain -continue to monitor   ?Ileus Potential ileus seen on imaging.  Patient endorses BM 2-3x day and have no pain/bleeding/strain. Home meds: miralax  -continue miralax  Transaminates: acutely raised on 3/22 labs but recovering am 3/23. Could be shock  liver Unclear etiology at this time, shock liver vs. Fatty liver disease. Previous ultrasound in 2012 notablediffuse fatty infiltrates. Denies any abdominal pain, nausea or vomiting. AST and ALT both down trending to 196 and 229 respectively, AST 794 and ALT 438 on admission.  -Trend CMET's   Hypothermia-resolved Temp WNL overnight, hx of low tempsper daughter over the past month when home nurse comes by to check on patient. ACTH stim test WNL. Blood cx neg x 1 day -continue to monitor   Anemia Hgb 8.6. BL ~10. Likely due to CKD, currently on Alfa Epo. Hx of colonoscopy in 2011withdiverticulosis of colon.  Acute drop from 10 may be dilutional after IVF. -Continue to follow  - Continue iron supplement   CKD III Acute injury resolved. Cr of 1.95. Baseline SCr2.0 since 2018.   - Encourage Fluid intake - Hold Lasix for now  - Continue to follow  Hyperkalemia-resolved K of 4.3.  -monitor on daily BMP  T2DM Hgb A1C 7.0 this admission from 6.8 prior. Home regiment includesLantus 4 units and Humalog 3 units TID with meals. CBG 88-178 in 24-hrs, 3U SSI given total.  -Sensitive sliding scale  -will hold basal insulin and likely d/c without insulin due to hypoglycemic risk  Hypertension Currently hypertensive to 169/70. Home meds: lasix 40mg  daily, hydralazine 50mg  tid, metoprolol 25mg  daily  - Hold lasix   - discontinue metoprolol XR given low HR - restart home hydralazine   Hypothyroidism  TSH slightly low. Home meds: synthroid 75 mcg daily  - Continue Synthroid   Gout  - Holding colcihine given kidney function  - Continue Allopurinol  FEN/GI:Carb modified Prophylaxis:Lovenox  Disposition: will likely dc with HH   Subjective:  Video sign language interpretor used during encounter.  Patient today doing "fine". Per patient's daughter patient slept well and is eating well, and requesting more food. Patient has been sleeping more recently and family is  worried it is related to his heart. Patient reports increased burping and food coming up as he eats. Denies SOB or CP.   Objective: Temp:  [98.1 F (36.7 C)-99.3 F (37.4 C)] 98.5 F (36.9 C) (03/24 0648) Pulse Rate:  [67-78] 74 (03/24 0648) Resp:  [18] 18 (03/24 0648) BP: (127-169)/(41-70) 169/70 (03/24 0648) SpO2:  [99 %-100 %] 100 % (03/24 0648) Weight:  [120 lb 3.2 oz (54.5 kg)] 120 lb 3.2 oz (54.5 kg) (03/24 1308) Physical Exam: General: awake and alert, NAD, laying in bed  Cardiovascular: RRR, no MRG Respiratory: CTAB, no wheezes, rales, or rhonchi  Abdomen: soft, non tender, non distended, bowel sounds normal  Extremities: non tender, no edema   Laboratory: Recent Labs  Lab 03/20/18 0631 03/21/18 0710 03/22/18 0908  WBC 4.2 3.5* 4.8  HGB 10.1* 8.8* 8.6*  HCT 33.3* 28.0* 28.4*  PLT 93* 93* 68*   Recent Labs  Lab 03/20/18 1617 03/21/18 0710 03/22/18 0908  NA 141 141 141  K 5.0 5.0 4.3  CL 115* 111 113*  CO2 19* 21* 20*  BUN 102* 87* 75*  CREATININE 2.21* 2.21* 1.95*  CALCIUM 8.3* 8.6* 8.8*  PROT 5.9* 6.3* 6.3*  BILITOT 0.3 0.4 0.4  ALKPHOS 198* 178* 158*  ALT 438* 333* 229*  AST 794* 434* 196*  GLUCOSE 87 103* 241*   Imaging/Diagnostic Tests: Dg Lumbar Spine 2-3 Views  Result Date: 03/20/2018 CLINICAL DATA:  Fall EXAM: LUMBAR SPINE - 2-3 VIEW COMPARISON:  None. FINDINGS: Extensive vascular calcification. Surgical changes and coiled in the upper abdomen. Mild levoscoliosis of the lumbar spine. Grade 1 anterolisthesis of L4 on L5. Moderate degenerative changes L5-S1, L4-L5. Age indeterminate moderate diffuse increased bowel gas with formed stool in the rectum. Compression deformity at L1. IMPRESSION: 1. Grade 1 anterolisthesis of L4 on L5 2. Age indeterminate compression deformity at L1 3. Diffuse increased bowel gas with formed stool in the rectum, query ileus. Electronically Signed   By: Donavan Foil M.D.   On: 03/20/2018 22:44   Dg Sacrum/coccyx  Result  Date: 03/20/2018 CLINICAL DATA:  Fall EXAM: SACRUM AND COCCYX - 2+ VIEW COMPARISON:  None. FINDINGS: SI joints are non widened. Pubic symphysis and rami are intact. Inferior sacrum is obscured on the frontal view by stool in the rectum. No definite acute osseous abnormality. IMPRESSION: Negative. Electronically Signed   By: Donavan Foil M.D.   On: 03/20/2018 22:45   Ct Chest Wo Contrast  Result Date: 03/21/2018 CLINICAL DATA:  Possible mediastinal mass on a recent portable chest radiograph. History of peptic ulcer disease. EXAM: CT CHEST WITHOUT CONTRAST TECHNIQUE: Multidetector CT imaging of the chest was performed following the standard protocol without IV contrast. COMPARISON:  Portable chest dated 03/20/2018. FINDINGS: Cardiovascular: Atheromatous calcifications, including the coronary arteries and aorta. Mild biatrial enlargement. Mediastinum/Nodes: Markedly dilated thoracic esophagus with mild diffuse wall thickening. This corresponds to the suspected mediastinal mass on the recent portable chest. No enlarged lymph nodes. Small thyroid gland. Lungs/Pleura: Small bilateral pleural effusions. Mild bilateral lower lobe atelectasis. Upper Abdomen: Surgical clips in the region of the gastric antrum and proximal stomach. Musculoskeletal: Thoracic and lower cervical spine degenerative changes with anterior ankylosis at multiple levels of the thoracic spine. IMPRESSION: 1. Markedly dilated thoracic esophagus compatible with achalasia. 2. Mild diffuse esophageal wall thickening, most likely due  to chronic esophagitis. 3. Small bilateral pleural effusions. 4. Mild bilateral lower lobe atelectasis. 5. Mild biatrial enlargement. 6.  Calcific coronary artery and aortic atherosclerosis. Aortic Atherosclerosis (ICD10-I70.0). Electronically Signed   By: Claudie Revering M.D.   On: 03/21/2018 16:43   Dg Chest Port 1 View  Result Date: 03/20/2018 CLINICAL DATA:  Hypoglycemia. EXAM: PORTABLE CHEST 1 VIEW COMPARISON:  None.  FINDINGS: Deviation of the trachea to the right. Masslike appearance of the mediastinal contours may be due to tortuosity of the aortic arch, however mediastinal mass cannot be entirely excluded. The heart is mildly enlarged. Volume loss in the left hemithorax with elevation of the left hemidiaphragm. No evidence of lobar consolidation, pleural effusion or pneumothorax. Osseous structures are without acute abnormality. Soft tissues are grossly normal. IMPRESSION: Masslike appearance of the mediastinal contours. This may be due to tortuosity of the aortic arch, however mediastinal mass cannot be entirely excluded, especially given the deviation of the trachea to the right. Evaluation with CT of the chest with contrast may be considered, if found clinically warranted. No evidence of lobar airspace consolidation. Electronically Signed   By: Fidela Salisbury M.D.   On: 03/20/2018 10:20   Dg Shoulder Left  Result Date: 03/20/2018 CLINICAL DATA:  Fall with pain in the arm and shoulder EXAM: LEFT SHOULDER - 2+ VIEW COMPARISON:  None. FINDINGS: No fracture or dislocation. High-riding humeral head consistent with rotator cuff disease. AC joint degenerative change. Moderate glenohumeral degenerative change. Vascular calcifications in the left axilla. IMPRESSION: 1. No acute osseous abnormality 2. High-riding humeral head consistent with rotator cuff disease. Arthritis of the left shoulder. Electronically Signed   By: Donavan Foil M.D.   On: 03/20/2018 22:39   Dg Humerus Left  Result Date: 03/20/2018 CLINICAL DATA:  Fall with arm pain EXAM: LEFT HUMERUS - 2+ VIEW COMPARISON:  None. FINDINGS: No fracture or malalignment. Arthritis at the left shoulder. Vascular calcification IMPRESSION: No acute osseous abnormality Electronically Signed   By: Donavan Foil M.D.   On: 03/20/2018 22:39     Caroline More, DO 03/22/2018, 11:25 AM PGY-1, [place] Intern pager: [phone], text pages  welcome

## 2018-03-22 NOTE — Progress Notes (Signed)
Pt's condom catheter came off, encouraged to use urinal and pt is doing fine with it. Palma Holter, RN

## 2018-03-23 DIAGNOSIS — E162 Hypoglycemia, unspecified: Secondary | ICD-10-CM | POA: Diagnosis not present

## 2018-03-23 DIAGNOSIS — E11649 Type 2 diabetes mellitus with hypoglycemia without coma: Secondary | ICD-10-CM | POA: Diagnosis not present

## 2018-03-23 LAB — GLUCOSE, CAPILLARY
GLUCOSE-CAPILLARY: 140 mg/dL — AB (ref 65–99)
Glucose-Capillary: 131 mg/dL — ABNORMAL HIGH (ref 65–99)

## 2018-03-23 LAB — COMPREHENSIVE METABOLIC PANEL
ALT: 182 U/L — ABNORMAL HIGH (ref 17–63)
AST: 109 U/L — ABNORMAL HIGH (ref 15–41)
Albumin: 2.9 g/dL — ABNORMAL LOW (ref 3.5–5.0)
Alkaline Phosphatase: 148 U/L — ABNORMAL HIGH (ref 38–126)
Anion gap: 6 (ref 5–15)
BUN: 62 mg/dL — ABNORMAL HIGH (ref 6–20)
CHLORIDE: 115 mmol/L — AB (ref 101–111)
CO2: 22 mmol/L (ref 22–32)
CREATININE: 1.91 mg/dL — AB (ref 0.61–1.24)
Calcium: 9 mg/dL (ref 8.9–10.3)
GFR calc non Af Amer: 30 mL/min — ABNORMAL LOW (ref >60)
GFR, EST AFRICAN AMERICAN: 35 mL/min — AB (ref >60)
Glucose, Bld: 139 mg/dL — ABNORMAL HIGH (ref 65–99)
POTASSIUM: 4.6 mmol/L (ref 3.5–5.1)
Sodium: 143 mmol/L (ref 135–145)
Total Bilirubin: 0.5 mg/dL (ref 0.3–1.2)
Total Protein: 6.4 g/dL — ABNORMAL LOW (ref 6.5–8.1)

## 2018-03-23 LAB — CBC
HEMATOCRIT: 28.4 % — AB (ref 39.0–52.0)
Hemoglobin: 8.9 g/dL — ABNORMAL LOW (ref 13.0–17.0)
MCH: 27.7 pg (ref 26.0–34.0)
MCHC: 31.3 g/dL (ref 30.0–36.0)
MCV: 88.5 fL (ref 78.0–100.0)
Platelets: 78 10*3/uL — ABNORMAL LOW (ref 150–400)
RBC: 3.21 MIL/uL — AB (ref 4.22–5.81)
RDW: 17.4 % — ABNORMAL HIGH (ref 11.5–15.5)
WBC: 5.8 10*3/uL (ref 4.0–10.5)

## 2018-03-23 MED ORDER — TIMOLOL MALEATE 0.5 % OP SOLN: 1.0000 [drp] | OPHTHALMIC | 12 refills | Status: DC

## 2018-03-23 MED ORDER — FUROSEMIDE 40 MG PO TABS: ORAL_TABLET | ORAL | 1 refills | Status: DC

## 2018-03-23 NOTE — Progress Notes (Signed)
Physical Therapy Treatment Patient Details Name: Philip Matthews MRN: [id number] DOB: March 03, 1928 Today's Date: 03/23/2018    History of Present Illness Pt is an 84 y.o. male admitted 03/20/18 after falling at home due to syncopal episode likely secondary to hypoglycemia. Xray L shoulder no acute abnormality, but indicative of arthritis and rotator cuff disease. Xray L humerus clear. Xray L-spine shows anterolisthesis L4-5 and age indeterminate L1 compression deformity. PMH includes deafness, HTN, DMII, CKD IV, gout.    PT Comments    Pt able to ambulate with supervision using RW. Spoke with daughter, who is also deaf, through I pad interpreter. Daughter is very concerned about pt's history of falls and ability to care of himself at home. Daughter is recovering from recent knee replacement and unable to care for pt. Considering this information, pt would need to be mod I for safety before returning home w/o assist. Updated recommendations to include HHPT and spoke with OT, they agree pt would benefit from [facility]. Case manager made aware of updates as pt is expected to d/c this PM.     Follow Up Recommendations  Home health PT;Supervision/Assistance - 24 hour     Equipment Recommendations  Rolling walker with 5" wheels    Recommendations for Other Services       Precautions / Restrictions Precautions Precautions: Fall Restrictions Weight Bearing Restrictions: No    Mobility  Bed Mobility Overal bed mobility: Modified Independent             General bed mobility comments: HOB elevated  Transfers Overall transfer level: Needs assistance Equipment used: Rolling walker (2 wheeled) Transfers: Sit to/from Stand Sit to Stand: Supervision            Ambulation/Gait Ambulation/Gait assistance: Supervision Ambulation Distance (Feet): 200 Feet Assistive device: Rolling walker (2 wheeled) Gait Pattern/deviations: Step-through pattern;Decreased stride length;Trunk flexed Gait  velocity: Decreased Gait velocity interpretation: Below normal speed for age/gender General Gait Details: Overall steady with RW for ambulation. Pt required cues for RW safety during turns.    Stairs            Wheelchair Mobility    Modified Rankin (Stroke Patients Only)       Balance Overall balance assessment: Needs assistance   Sitting balance-Leahy Scale: Good Sitting balance - Comments: Indep to don shoes sitting EOB; indep with pericare on toilet   Standing balance support: Bilateral upper extremity supported;No upper extremity supported;During functional activity Standing balance-Leahy Scale: Fair Standing balance comment: Stability much improved with BUE support                            Cognition Arousal/Alertness: Awake/alert Behavior During Therapy: WFL for tasks assessed/performed Overall Cognitive Status: Within Functional Limits for tasks assessed                                        Exercises      General Comments General comments (skin integrity, edema, etc.): daughter present and ALS interperter used       Pertinent Vitals/Pain Pain Assessment: No/denies pain    Home Living                      Prior Function            PT Goals (current goals can now be found in the care plan  section) Acute Rehab PT Goals Patient Stated Goal: None stated PT Goal Formulation: With patient/family Time For Goal Achievement: 04/04/18 Progress towards PT goals: Progressing toward goals    Frequency    Min 3X/week      PT Plan Current plan remains appropriate    Co-evaluation              AM-PAC PT "6 Clicks" Daily Activity  Outcome Measure  Difficulty turning over in bed (including adjusting bedclothes, sheets and blankets)?: None Difficulty moving from lying on back to sitting on the side of the bed? : None Difficulty sitting down on and standing up from a chair with arms (e.g., wheelchair, bedside  commode, etc,.)?: None Help needed moving to and from a bed to chair (including a wheelchair)?: A Little Help needed walking in hospital room?: A Little Help needed climbing 3-5 steps with a railing? : A Little 6 Click Score: 21    End of Session Equipment Utilized During Treatment: Gait belt Activity Tolerance: Patient tolerated treatment well Patient left: with family/visitor present;in bed;with call bell/phone within reach(washing hands at sink with OT) Nurse Communication: Mobility status PT Visit Diagnosis: Other abnormalities of gait and mobility (R26.89)     Time: 8921-1941 PT Time Calculation (min) (ACUTE ONLY): 22 min  Charges:  $Gait Training: 8-22 mins                    G Codes:       Benjiman Core, [place] Pager [phone] Acute Rehab  Allena Katz 03/23/2018, 2:43 PM

## 2018-03-23 NOTE — Care Management Note (Signed)
Case Management Note  Patient Details  Name: Philip Matthews MRN: [id number] Date of Birth: 03-18-1928  Subjective/Objective:   Syncope                Action/Plan: Patient lives at home with his daughter; CM talked to patient via sign language interpreter; he is active with [place] for [facility]; Dan with [facility] made aware; he is requesting a rolling walker; a walker is to be delivered to the room today prior to discharging home.   Expected Discharge Date:  03/23/18               Expected Discharge Plan:  [place]  Discharge planning Services  CM Consult  Post Acute Care Choice:    Choice offered to:  Patient, Adult Children  DME Arranged:  Walker rolling DME Agency:  [place] Arranged:  RN, PT, OT, Nurse's Aide [place] Agency:  [place]  Status of Service:  In process, will continue to follow  Sherrilyn Rist [phone] 03/23/2018, 2:14 PM

## 2018-03-23 NOTE — Progress Notes (Signed)
Discharge instructions/medications reviewed with patient and daughter with use of an interpreter. All questions answered; patient/family with no further questions at this time. Pt stable and belongings packed. IVs and telemetry removed. Pt and daughter aware to wait for walker to be delivered to room before discharging hospital.

## 2018-03-24 DIAGNOSIS — D649 Anemia, unspecified: Secondary | ICD-10-CM | POA: Diagnosis not present

## 2018-03-24 DIAGNOSIS — Z5181 Encounter for therapeutic drug level monitoring: Secondary | ICD-10-CM | POA: Diagnosis not present

## 2018-03-24 DIAGNOSIS — N183 Chronic kidney disease, stage 3 (moderate): Secondary | ICD-10-CM | POA: Diagnosis not present

## 2018-03-25 LAB — CULTURE, BLOOD (ROUTINE X 2)
Culture: NO GROWTH
Culture: NO GROWTH
SPECIAL REQUESTS: ADEQUATE
Special Requests: ADEQUATE

## 2018-03-26 ENCOUNTER — Ambulatory Visit (INDEPENDENT_AMBULATORY_CARE_PROVIDER_SITE_OTHER): Payer: Medicare Other | Admitting: Internal Medicine

## 2018-03-26 ENCOUNTER — Encounter: Payer: Self-pay | Admitting: Internal Medicine

## 2018-03-26 ENCOUNTER — Other Ambulatory Visit: Payer: Self-pay

## 2018-03-26 VITALS — BP 142/66 | HR 84 | Temp 98.1°F | Wt 123.0 lb

## 2018-03-26 DIAGNOSIS — M19012 Primary osteoarthritis, left shoulder: Secondary | ICD-10-CM

## 2018-03-26 DIAGNOSIS — E1143 Type 2 diabetes mellitus with diabetic autonomic (poly)neuropathy: Secondary | ICD-10-CM

## 2018-03-26 LAB — GLUCOSE, POCT (MANUAL RESULT ENTRY): POC Glucose: 177 mg/dl — AB (ref 70–99)

## 2018-03-26 MED ORDER — GLUCOSE BLOOD VI STRP: ORAL_STRIP | 3 refills | Status: DC

## 2018-03-26 MED ORDER — DICLOFENAC SODIUM 1 % TD GEL: 4.0000 g | Freq: Four times a day (QID) | TRANSDERMAL | 1 refills | Status: DC

## 2018-03-26 NOTE — Progress Notes (Signed)
   [facility] Phone: [phone]  Subjective:  Philip Matthews is an 84 year old male presenting to clinic for a hospital follow-up appointment. He was hospitalized from 03/20/18-03/23/18 with syncope in the setting of hypoglycemia. His initial blood sugar in the ED was 18. Daughter states that he accidentally took his insulin without eating, which lead to the low blood sugar. His insulin was discontinued on discharge from the hospital. Daughter has been checking his blood sugar 6 times a day. They have been ranging from 110-243. No lows. No shakiness, no sweatiness, no polyuria, no polydipsia.  Patient would also like to discuss left shoulder pain. Has been going on for years. Has gotten worse recently. Pain is located on the outside of his shoulder. He describes the pain as "sharp". Pain is worse with rainy days and is also worse with reaching overhead and lifting anything. He has also noticed a decreased ROM. He has tried using Tylenol and heat, which haven't helped. He has not noticed any redness or swelling. No left upper extremity weakness. No numbness.  ROS: See HPI for pertinent positives and negatives  Past Medical History- HTN, hx portal vein thrombosis, Barrett's esophagus, T2DM, bilateral deafness, anemia of chronic disease, CKD III, HLD, gout, hypothyroidism.  Family history reviewed for today's visit. No changes.  Social history- patient is a former smoker, quit in 2012.  Objective: BP (!) 142/66   Pulse 84   Temp 98.1 F (36.7 C) (Oral)   Wt 123 lb (55.8 kg)   SpO2 99%   BMI 21.11 kg/m  Gen: NAD, alert, cooperative with exam HEENT: NCAT, EOMI, MMM Neck: FROM, supple CV: RRR, no murmur Resp: CTABL, no wheezes, normal work of breathing Left Shoulder: No erythema, edema, or gross deformity. Decreased active ROM to 90 degrees of flexion and abduction. Normal passive ROM. +tenderness to palpation in the region of the greater and lesser tubercle of the humerus.  +empty can test. Negative Hawkin's. Unable to assess drop arm test because patient cannot abduct greater than 90 degrees.  Assessment/Plan: T2DM: Well-controlled. A1c in the hospital was 7.0%. Recent hypoglycemic episode that lead to hospitalization. All insulin stopped on discharge. - Check blood sugars once daily. Patient will let me know if they are persistently above 250. - Continue to hold Lantus and Novolog - If blood sugars persistently elevated, can consider Januvia at reduced dose due to CKD  Left Shoulder Pain: Likely rotator cuff tendonitis. X-ray performed in the hospital showed high-riding humeral head consistent with rotator cuff disease and arthritis of the left shoulder. Adhesive capsulitis less likely because patient has normal passive ROM.  - Discussed different treatment options including PT and voltaren gel vs cortisone injection. Patient would like to try PT and Voltaren gel for now. - Follow-up if no improvement in 1-2 months.   Hyman Bible, MD PGY-3

## 2018-03-26 NOTE — Assessment & Plan Note (Signed)
Well-controlled. A1c in the hospital was 7.0%. Recent hypoglycemic episode that lead to hospitalization. All insulin stopped on discharge. - Check blood sugars once daily. Patient will let me know if they are persistently above 250. - Continue to hold Lantus and Novolog - If blood sugars persistently elevated, can consider Januvia at reduced dose due to CKD

## 2018-03-26 NOTE — Assessment & Plan Note (Signed)
Likely rotator cuff tendonitis. X-ray performed in the hospital showed high-riding humeral head consistent with rotator cuff disease and arthritis of the left shoulder. Adhesive capsulitis less likely because patient has normal passive ROM.  - Discussed different treatment options including PT and voltaren gel vs cortisone injection. Patient would like to try PT and Voltaren gel for now. - Follow-up if no improvement in 1-2 months.

## 2018-03-26 NOTE — Patient Instructions (Signed)
It was so nice to see you!  For your diabetes- we will stop your insulin. Please check your blood sugar once daily in the morning and any time you think his blood sugar might be high. Please let me know if his sugars are persistently higher than 250.  For the shoulder pain- hopefully this will get better with physical therapy. I have prescribed some Voltaren gel. You can use this on the shoulder 4 times daily as needed. Please let me know if this doesn't get better and we can discuss doing an injection.  -Dr. Brett Albino

## 2018-03-31 ENCOUNTER — Telehealth: Payer: Self-pay

## 2018-03-31 NOTE — Telephone Encounter (Signed)
Okay to give verbal orders for OT. Thanks!

## 2018-03-31 NOTE — Telephone Encounter (Signed)
Manuela Schwartz- OT with [facility] calling for orders. Requesting to see patient 2x week for 1 week 1x wk for 2 weeks Her call back for verbal orders [phone] Wallace Cullens, RN

## 2018-04-01 ENCOUNTER — Telehealth: Payer: Self-pay

## 2018-04-01 NOTE — Telephone Encounter (Signed)
Merry Proud, PT with [facility], called for following verbal orders:  PT 2x/wk x 2 for lower extremity strengthening, gait and balance training, fall prevention.   Call back [phone]  Danley Danker, RN [facility] RN)

## 2018-04-02 NOTE — Telephone Encounter (Signed)
Okay to give verbal orders for PT. Thanks!

## 2018-04-02 NOTE — Telephone Encounter (Signed)
Verbal order given to [facility]. Danley Danker, RN [facility] RN)

## 2018-04-03 NOTE — Telephone Encounter (Signed)
Verbal orders given. Wallace Cullens, RN

## 2018-04-08 ENCOUNTER — Telehealth: Payer: Self-pay

## 2018-04-08 DIAGNOSIS — Z5181 Encounter for therapeutic drug level monitoring: Secondary | ICD-10-CM | POA: Diagnosis not present

## 2018-04-08 DIAGNOSIS — N183 Chronic kidney disease, stage 3 (moderate): Secondary | ICD-10-CM | POA: Diagnosis not present

## 2018-04-08 DIAGNOSIS — D649 Anemia, unspecified: Secondary | ICD-10-CM | POA: Diagnosis not present

## 2018-04-08 NOTE — Telephone Encounter (Signed)
Amy- RN with [facility] calling to update Dr. Brett Albino. Saw patient today and his temp was 94.6- states this is normal for him from time to time- wants to know if he may need lab work or follow up with MD. She is also asking if it is ok for patient to take loratadine for his allergies? She is requesting all his OTC medications be sent to [facility] on [address]- that they will dispense in blister packs for patients if they have Rx. Requesting Rx for his Fe 325mg  #30 Ca 500mg  #60 B 12 1069mcg #30 and Gummy Multivitamin #30. Amy's call back [phone]

## 2018-04-09 ENCOUNTER — Other Ambulatory Visit: Payer: Self-pay | Admitting: Internal Medicine

## 2018-04-09 MED ORDER — VITAMIN B-12 100 MCG PO TABS: 100.0000 ug | ORAL_TABLET | Freq: Every day | ORAL | 0 refills | Status: DC

## 2018-04-09 MED ORDER — FERROUS SULFATE 325 (65 FE) MG PO TABS: 325.0000 mg | ORAL_TABLET | Freq: Every day | ORAL | 0 refills | Status: DC

## 2018-04-09 MED ORDER — EQ MULTIVITAMINS ADULT GUMMY PO CHEW: 1.0000 | CHEWABLE_TABLET | Freq: Every day | ORAL | 0 refills | Status: DC

## 2018-04-09 MED ORDER — CALCIUM CARBONATE 1250 (500 CA) MG PO TABS: 1.0000 | ORAL_TABLET | Freq: Two times a day (BID) | ORAL | 0 refills | Status: DC

## 2018-04-09 NOTE — Telephone Encounter (Signed)
Returned Amy's call and left a voicemail. Can add TSH and T4 to his next blood draw due to his low temps. Will send in those OTC meds to [place]. She can call us back with any concerns.

## 2018-04-20 ENCOUNTER — Telehealth: Payer: Self-pay

## 2018-04-20 DIAGNOSIS — E038 Other specified hypothyroidism: Secondary | ICD-10-CM

## 2018-04-20 DIAGNOSIS — Z5181 Encounter for therapeutic drug level monitoring: Secondary | ICD-10-CM | POA: Diagnosis not present

## 2018-04-20 DIAGNOSIS — N183 Chronic kidney disease, stage 3 unspecified: Secondary | ICD-10-CM

## 2018-04-20 DIAGNOSIS — E039 Hypothyroidism, unspecified: Secondary | ICD-10-CM | POA: Diagnosis not present

## 2018-04-20 DIAGNOSIS — E1143 Type 2 diabetes mellitus with diabetic autonomic (poly)neuropathy: Secondary | ICD-10-CM

## 2018-04-20 DIAGNOSIS — K5900 Constipation, unspecified: Secondary | ICD-10-CM

## 2018-04-20 DIAGNOSIS — D649 Anemia, unspecified: Secondary | ICD-10-CM | POA: Diagnosis not present

## 2018-04-20 DIAGNOSIS — I10 Essential (primary) hypertension: Secondary | ICD-10-CM

## 2018-04-20 NOTE — Telephone Encounter (Signed)
Received call from Amy, RN with [facility], who does patient's med box. She was informed over weekend that there was a problem with getting meds transferred to [facility] from [place].  Please send new prescriptions for all meds to [facility] because they will deliver and put in blister packs. Has enough to fill med box for this week only.  Amy call back is [phone].  Danley Danker, RN [facility] RN)

## 2018-04-21 ENCOUNTER — Other Ambulatory Visit: Payer: Self-pay | Admitting: Internal Medicine

## 2018-04-21 MED ORDER — FUROSEMIDE 40 MG PO TABS: ORAL_TABLET | ORAL | 1 refills | Status: DC

## 2018-04-21 MED ORDER — POLYETHYLENE GLYCOL 3350 17 GM/SCOOP PO POWD: 17.0000 g | Freq: Two times a day (BID) | ORAL | 6 refills | Status: AC

## 2018-04-21 MED ORDER — ALLOPURINOL 100 MG PO TABS: ORAL_TABLET | ORAL | 1 refills | Status: DC

## 2018-04-21 MED ORDER — PRAVASTATIN SODIUM 40 MG PO TABS: ORAL_TABLET | ORAL | 1 refills | Status: DC

## 2018-04-21 MED ORDER — LEVOTHYROXINE SODIUM 75 MCG PO TABS: 75.0000 ug | ORAL_TABLET | Freq: Every day | ORAL | 0 refills | Status: DC

## 2018-04-21 MED ORDER — HYDRALAZINE HCL 50 MG PO TABS: ORAL_TABLET | ORAL | 3 refills | Status: DC

## 2018-04-21 MED ORDER — VITAMIN B-12 100 MCG PO TABS: 100.0000 ug | ORAL_TABLET | Freq: Every day | ORAL | 0 refills | Status: DC

## 2018-04-21 MED ORDER — CALCITRIOL 0.25 MCG PO CAPS: 0.2500 ug | ORAL_CAPSULE | ORAL | 2 refills | Status: AC

## 2018-04-21 MED ORDER — EQ MULTIVITAMINS ADULT GUMMY PO CHEW: 1.0000 | CHEWABLE_TABLET | Freq: Every day | ORAL | 0 refills | Status: DC

## 2018-04-21 MED ORDER — GLUCOSE BLOOD VI STRP: ORAL_STRIP | 3 refills | Status: DC

## 2018-04-21 MED ORDER — CALCIUM CARBONATE 1250 (500 CA) MG PO TABS: 1.0000 | ORAL_TABLET | Freq: Two times a day (BID) | ORAL | 0 refills | Status: DC

## 2018-04-21 MED ORDER — DICLOFENAC SODIUM 1 % TD GEL: 4.0000 g | Freq: Four times a day (QID) | TRANSDERMAL | 1 refills | Status: DC

## 2018-04-21 MED ORDER — PANTOPRAZOLE SODIUM 40 MG PO TBEC: 40.0000 mg | DELAYED_RELEASE_TABLET | Freq: Two times a day (BID) | ORAL | 0 refills | Status: DC

## 2018-04-21 MED ORDER — FERROUS SULFATE 325 (65 FE) MG PO TABS: 325.0000 mg | ORAL_TABLET | Freq: Every day | ORAL | 0 refills | Status: DC

## 2018-04-21 MED ORDER — DOCUSATE SODIUM 100 MG PO CAPS: 100.0000 mg | ORAL_CAPSULE | Freq: Two times a day (BID) | ORAL | 0 refills | Status: AC | PRN

## 2018-04-21 NOTE — Telephone Encounter (Signed)
New prescriptions sent to [facility]. Thanks!

## 2018-04-23 DIAGNOSIS — H401121 Primary open-angle glaucoma, left eye, mild stage: Secondary | ICD-10-CM | POA: Diagnosis not present

## 2018-04-23 DIAGNOSIS — H401113 Primary open-angle glaucoma, right eye, severe stage: Secondary | ICD-10-CM | POA: Diagnosis not present

## 2018-05-04 DIAGNOSIS — N183 Chronic kidney disease, stage 3 (moderate): Secondary | ICD-10-CM | POA: Diagnosis not present

## 2018-05-04 DIAGNOSIS — Z5181 Encounter for therapeutic drug level monitoring: Secondary | ICD-10-CM | POA: Diagnosis not present

## 2018-05-04 DIAGNOSIS — D649 Anemia, unspecified: Secondary | ICD-10-CM | POA: Diagnosis not present

## 2018-05-11 ENCOUNTER — Telehealth: Payer: Self-pay

## 2018-05-11 DIAGNOSIS — E1143 Type 2 diabetes mellitus with diabetic autonomic (poly)neuropathy: Secondary | ICD-10-CM

## 2018-05-11 NOTE — Telephone Encounter (Signed)
Amy, RN with [facility], left message that [place] is unable to fill patient's diabetic supplies.  Please send test strips to [facility] on [address]. Stated patient is checking bs 2-3 times daily and is out because Rx is for once daily. She would like to know if directions can be changed to 2-3 times daily for testing frequency.  Call back is [phone].  Danley Danker, RN [facility] RN)

## 2018-05-12 ENCOUNTER — Other Ambulatory Visit: Payer: Self-pay | Admitting: Internal Medicine

## 2018-05-12 DIAGNOSIS — E1143 Type 2 diabetes mellitus with diabetic autonomic (poly)neuropathy: Secondary | ICD-10-CM

## 2018-05-12 MED ORDER — GLUCOSE BLOOD VI STRP: ORAL_STRIP | 3 refills | Status: DC

## 2018-05-12 NOTE — Telephone Encounter (Signed)
New prescription sent to [facility] on [address]. Thanks!

## 2018-05-18 ENCOUNTER — Telehealth: Payer: Self-pay

## 2018-05-18 DIAGNOSIS — Z5181 Encounter for therapeutic drug level monitoring: Secondary | ICD-10-CM | POA: Diagnosis not present

## 2018-05-18 DIAGNOSIS — D649 Anemia, unspecified: Secondary | ICD-10-CM | POA: Diagnosis not present

## 2018-05-18 DIAGNOSIS — N183 Chronic kidney disease, stage 3 (moderate): Secondary | ICD-10-CM | POA: Diagnosis not present

## 2018-05-18 NOTE — Telephone Encounter (Signed)
Amy- RN with [facility] calling regarding patient. Needs rx for calcium 500mg  + vit D 3 400iu [facility] rx they can package OTC meds with his Rx meds. Call back number [phone] Wallace Cullens, RN

## 2018-05-20 ENCOUNTER — Other Ambulatory Visit: Payer: Self-pay | Admitting: Internal Medicine

## 2018-05-20 MED ORDER — CALCIUM-VITAMIN D 500-400 MG-UNIT PO TABS: 1.0000 | ORAL_TABLET | Freq: Two times a day (BID) | ORAL | 0 refills | Status: DC

## 2018-05-20 NOTE — Telephone Encounter (Signed)
Prescription sent to [facility].

## 2018-06-01 ENCOUNTER — Other Ambulatory Visit: Payer: Self-pay | Admitting: Internal Medicine

## 2018-06-01 DIAGNOSIS — N183 Chronic kidney disease, stage 3 (moderate): Secondary | ICD-10-CM | POA: Diagnosis not present

## 2018-06-01 DIAGNOSIS — Z5181 Encounter for therapeutic drug level monitoring: Secondary | ICD-10-CM | POA: Diagnosis not present

## 2018-06-01 DIAGNOSIS — D649 Anemia, unspecified: Secondary | ICD-10-CM | POA: Diagnosis not present

## 2018-06-08 ENCOUNTER — Ambulatory Visit (INDEPENDENT_AMBULATORY_CARE_PROVIDER_SITE_OTHER): Payer: Medicare Other | Admitting: Podiatry

## 2018-06-08 DIAGNOSIS — E0842 Diabetes mellitus due to underlying condition with diabetic polyneuropathy: Secondary | ICD-10-CM

## 2018-06-08 DIAGNOSIS — M79609 Pain in unspecified limb: Secondary | ICD-10-CM | POA: Diagnosis not present

## 2018-06-08 DIAGNOSIS — B351 Tinea unguium: Secondary | ICD-10-CM | POA: Diagnosis not present

## 2018-06-10 NOTE — Progress Notes (Signed)
   SUBJECTIVE Patient with a history of diabetes mellitus presents to office today complaining of elongated, thickened nails that cause pain while ambulating in shoes.  He is unable to trim his own nails. Patient is here for further evaluation and treatment.   Past Medical History:  Diagnosis Date  . Anemia   . Arthritis    "joints" (08/27/2017)  . ARTHRITIS, LEFT SHOULDER 02/01/2010   Qualifier: Diagnosis of  By: Jacques Earthly    . Chronic kidney disease (CKD), stage IV (severe) ([place])   . Deaf   . Diverticulosis   . DIVERTICULOSIS OF COLON 03/08/2010   Qualifier: Diagnosis of  By: Jacques Earthly    . Esophagitis    HSV  . Gout   . History of DVT (deep vein thrombosis)   . Hyperlipidemia   . Hypertension   . Hypothyroidism   . Kidney mass    lipoma  . Liver nodule    u/s 310  . Portal vein thrombosis    on life long coumadin  . PUD, HX OF 03/08/2010   Qualifier: History of  By: Jacques Earthly    . Type II diabetes mellitus ([place])     OBJECTIVE General Patient is awake, alert, and oriented x 3 and in no acute distress. Derm Skin is dry and supple bilateral. Negative open lesions or macerations. Remaining integument unremarkable. Nails are tender, long, thickened and dystrophic with subungual debris, consistent with onychomycosis, 1-5 bilateral. No signs of infection noted. Vasc  DP and PT pedal pulses palpable bilaterally. Temperature gradient within normal limits.  Neuro Epicritic and protective threshold sensation diminished bilaterally.  Musculoskeletal Exam No symptomatic pedal deformities noted bilateral. Muscular strength within normal limits.  ASSESSMENT 1. Diabetes Mellitus w/ peripheral neuropathy 2. Onychomycosis of nail due to dermatophyte bilateral 3. Pain in foot bilateral  PLAN OF CARE 1. Patient evaluated today. 2. Instructed to maintain good pedal hygiene and foot care. Stressed importance of controlling blood sugar.  3. Mechanical debridement of  nails 1-5 bilaterally performed using a nail nipper. Filed with dremel without incident.  4. Return to clinic in 3 mos.     Edrick Kins, DPM [facility]  Dr. Edrick Kins, [address]                                        [place], [place], [place] [address]                Office [phone]  Fax [phone]

## 2018-06-15 DIAGNOSIS — N183 Chronic kidney disease, stage 3 (moderate): Secondary | ICD-10-CM | POA: Diagnosis not present

## 2018-06-15 DIAGNOSIS — Z5181 Encounter for therapeutic drug level monitoring: Secondary | ICD-10-CM | POA: Diagnosis not present

## 2018-06-15 DIAGNOSIS — D649 Anemia, unspecified: Secondary | ICD-10-CM | POA: Diagnosis not present

## 2018-06-16 ENCOUNTER — Other Ambulatory Visit: Payer: Self-pay

## 2018-06-16 DIAGNOSIS — E1143 Type 2 diabetes mellitus with diabetic autonomic (poly)neuropathy: Secondary | ICD-10-CM

## 2018-06-16 MED ORDER — GLUCOSE BLOOD VI STRP: ORAL_STRIP | 3 refills | Status: DC

## 2018-06-16 MED ORDER — ONETOUCH ULTRASOFT LANCETS MISC: 12 refills | Status: DC

## 2018-06-17 ENCOUNTER — Other Ambulatory Visit: Payer: Self-pay | Admitting: Internal Medicine

## 2018-06-18 DIAGNOSIS — N2581 Secondary hyperparathyroidism of renal origin: Secondary | ICD-10-CM | POA: Diagnosis not present

## 2018-06-18 DIAGNOSIS — N183 Chronic kidney disease, stage 3 (moderate): Secondary | ICD-10-CM | POA: Diagnosis not present

## 2018-06-18 DIAGNOSIS — D631 Anemia in chronic kidney disease: Secondary | ICD-10-CM | POA: Diagnosis not present

## 2018-06-18 DIAGNOSIS — I129 Hypertensive chronic kidney disease with stage 1 through stage 4 chronic kidney disease, or unspecified chronic kidney disease: Secondary | ICD-10-CM | POA: Diagnosis not present

## 2018-06-29 ENCOUNTER — Other Ambulatory Visit: Payer: Self-pay

## 2018-06-29 DIAGNOSIS — N183 Chronic kidney disease, stage 3 (moderate): Secondary | ICD-10-CM | POA: Diagnosis not present

## 2018-06-29 DIAGNOSIS — Z5181 Encounter for therapeutic drug level monitoring: Secondary | ICD-10-CM | POA: Diagnosis not present

## 2018-06-29 DIAGNOSIS — D649 Anemia, unspecified: Secondary | ICD-10-CM | POA: Diagnosis not present

## 2018-06-29 DIAGNOSIS — E1143 Type 2 diabetes mellitus with diabetic autonomic (poly)neuropathy: Secondary | ICD-10-CM

## 2018-07-03 ENCOUNTER — Telehealth: Payer: Self-pay | Admitting: *Deleted

## 2018-07-03 NOTE — Telephone Encounter (Signed)
Philip Matthews needs verbal orders from pts new PCP to continue seeing him every other week to check his procrit.  Fleeger, [name], CMA

## 2018-07-04 NOTE — Telephone Encounter (Signed)
Please give verbal authorization for these. Thanks!

## 2018-07-06 NOTE — Telephone Encounter (Signed)
Verbal orders given to Amy.  Jazmin Hartsell,CMA

## 2018-07-09 ENCOUNTER — Other Ambulatory Visit: Payer: Self-pay | Admitting: *Deleted

## 2018-07-09 DIAGNOSIS — E1143 Type 2 diabetes mellitus with diabetic autonomic (poly)neuropathy: Secondary | ICD-10-CM

## 2018-07-09 NOTE — Telephone Encounter (Signed)
Fax received from pharmacy.  Patient is requesting a 90 day supply of his lancets be sent into the pharmacy.  Will forward to MD. Johnney Ou

## 2018-07-10 MED ORDER — ONETOUCH ULTRASOFT LANCETS MISC: 4 refills | Status: AC

## 2018-07-13 DIAGNOSIS — Z5181 Encounter for therapeutic drug level monitoring: Secondary | ICD-10-CM | POA: Diagnosis not present

## 2018-07-13 DIAGNOSIS — D649 Anemia, unspecified: Secondary | ICD-10-CM | POA: Diagnosis not present

## 2018-07-13 DIAGNOSIS — N183 Chronic kidney disease, stage 3 (moderate): Secondary | ICD-10-CM | POA: Diagnosis not present

## 2018-07-20 NOTE — Progress Notes (Signed)
  Subjective:   Patient ID: Philip Matthews    DOB: 1928/07/12, 84 y.o. male   MRN: [id number]  Philip Matthews is a 84 y.o. male with a history of isolated systolic HTN, DM2, barretts esophagus, hypothyroidism, CKD3 here for   Diabetes, Type 2 - Last A1c 7.0 03/20/2018 - Medications: None currently. Held lantus and novolog due to hypoglycemic episode leading to hospitalization in 02/2018. - Checking BG at home: yes, 110-188 - Diet: scrambled/fried eggs with [place] bacon, fruit. Senior citizen home for lunch (lunch/catered from [employer]). Dinner collard greens, peas, fish, baked chicken, salad, spaghetti with sauce. Watching his carb intake.  - Eye exam: 04/23/18, glaucoma evaluation with Dr. Edilia Matthews - Foot exam: due - Microalbumin: N/A per nephro - Pneumococcal vaccine: due - Denies symptoms of hypoglycemia, numbness extremities, foot ulcers/trauma  Systolic Hypertension: - Medications: hydralazine 50mg  TID, lasix 40mg  qd. Previously on Norvasc, metoprolol succinate.  - Compliance: good - Checking BP at home: no. [name] is checking with no abnormal BP noticed. - Denies any SOB, CP, vision changes, LE edema, medication SEs, or symptoms of hypotension - Diet: see above  Review of Systems:  Per HPI.  [place], medications and smoking status reviewed.  Objective:   BP 100/68   Pulse 82   Temp 97.7 F (36.5 C) (Oral)   Wt 116 lb 6.4 oz (52.8 kg)   SpO2 99%   BMI 19.98 kg/m  Vitals and nursing note reviewed.  General: well nourished, well developed, elderly male in no acute distress with non-toxic appearance CV: regular rate and rhythm without murmurs, rubs, or gallops, no lower extremity edema Lungs: clear to auscultation bilaterally with normal work of breathing Abdomen: soft, non-tender, non-distended, no masses or organomegaly palpable, normoactive bowel sounds Skin: warm, dry, no rashes or lesions Extremities: warm and well perfused, normal tone MSK: ROM grossly intact,  strength intact, gait normal Neuro: Alert and oriented, speech normal  Assessment & Plan:   Isolated systolic HTN, goal SBP <979.  142/68, 100/68 on recheck. Denies symptoms of hypotension. No changes made today. Advised to call for symptoms of low BP.  Type II diabetes mellitus, controlled but with peripheral neuropathy A1c worsened off of medications, 8.9 today. CBGs within acceptable range however. Will start low dose Victoza and follow up in 2 weeks with CBG log. Strict return precautions given for symptoms of low blood sugar. Foot exam done today, wnl.  Orders Placed This Encounter  Procedures  . Pneumococcal polysaccharide vaccine 23-valent greater than or equal to 2yo subcutaneous/IM  . HgB A1c   Meds ordered this encounter  Medications  . liraglutide (VICTOZA) 18 MG/3ML SOPN    Sig: Inject 0.1 mLs (0.6 mg total) into the skin daily.    Dispense:  1 pen    Refill:  0   Precepted with Dr. Gwendlyn Matthews.   Philip Percy, DO PGY-2, [facility] 07/21/2018 2:56 PM

## 2018-07-21 ENCOUNTER — Encounter: Payer: Self-pay | Admitting: Family Medicine

## 2018-07-21 ENCOUNTER — Other Ambulatory Visit: Payer: Self-pay

## 2018-07-21 ENCOUNTER — Ambulatory Visit (INDEPENDENT_AMBULATORY_CARE_PROVIDER_SITE_OTHER): Payer: Medicare Other | Admitting: Family Medicine

## 2018-07-21 VITALS — BP 100/68 | HR 82 | Temp 97.7°F | Wt 116.4 lb

## 2018-07-21 DIAGNOSIS — I10 Essential (primary) hypertension: Secondary | ICD-10-CM | POA: Diagnosis not present

## 2018-07-21 DIAGNOSIS — E1143 Type 2 diabetes mellitus with diabetic autonomic (poly)neuropathy: Secondary | ICD-10-CM | POA: Diagnosis not present

## 2018-07-21 DIAGNOSIS — Z23 Encounter for immunization: Secondary | ICD-10-CM

## 2018-07-21 LAB — POCT GLYCOSYLATED HEMOGLOBIN (HGB A1C): HbA1c, POC (controlled diabetic range): 8.9 % — AB (ref 0.0–7.0)

## 2018-07-21 MED ORDER — LIRAGLUTIDE 18 MG/3ML ~~LOC~~ SOPN: 0.6000 mg | PEN_INJECTOR | Freq: Every day | SUBCUTANEOUS | 0 refills | Status: AC

## 2018-07-21 NOTE — Assessment & Plan Note (Addendum)
A1c worsened off of medications, 8.9 today. CBGs within acceptable range however. Will start low dose Victoza and follow up in 2 weeks with CBG log. Strict return precautions given for symptoms of low blood sugar. Foot exam done today, wnl.

## 2018-07-21 NOTE — Assessment & Plan Note (Signed)
142/68, 100/68 on recheck. Denies symptoms of hypotension. No changes made today. Advised to call for symptoms of low BP.

## 2018-07-21 NOTE — Patient Instructions (Addendum)
It was great to see you!  Our plans for today:  - Start a medication to help better control your diabetes.  - Continue to keep a log of his blood sugars. - Follow up in 2 weeks with a copy of your blood sugar log. - No change to your blood pressure medications.  Take care and seek immediate care sooner if you develop any concerns.   Dr. [facility]

## 2018-07-27 DIAGNOSIS — N183 Chronic kidney disease, stage 3 (moderate): Secondary | ICD-10-CM | POA: Diagnosis not present

## 2018-07-27 DIAGNOSIS — D649 Anemia, unspecified: Secondary | ICD-10-CM | POA: Diagnosis not present

## 2018-07-27 DIAGNOSIS — Z5181 Encounter for therapeutic drug level monitoring: Secondary | ICD-10-CM | POA: Diagnosis not present

## 2018-07-30 ENCOUNTER — Other Ambulatory Visit: Payer: Self-pay | Admitting: *Deleted

## 2018-07-30 DIAGNOSIS — E1143 Type 2 diabetes mellitus with diabetic autonomic (poly)neuropathy: Secondary | ICD-10-CM

## 2018-07-30 MED ORDER — GLUCOSE BLOOD VI STRP: ORAL_STRIP | 3 refills | Status: AC

## 2018-08-04 ENCOUNTER — Encounter: Payer: Self-pay | Admitting: Family Medicine

## 2018-08-04 ENCOUNTER — Other Ambulatory Visit: Payer: Self-pay

## 2018-08-04 ENCOUNTER — Ambulatory Visit (INDEPENDENT_AMBULATORY_CARE_PROVIDER_SITE_OTHER): Payer: Medicare Other | Admitting: Family Medicine

## 2018-08-04 VITALS — BP 130/64 | HR 65 | Temp 97.5°F | Ht 64.0 in | Wt 121.0 lb

## 2018-08-04 DIAGNOSIS — Z9181 History of falling: Secondary | ICD-10-CM

## 2018-08-04 DIAGNOSIS — E1143 Type 2 diabetes mellitus with diabetic autonomic (poly)neuropathy: Secondary | ICD-10-CM | POA: Diagnosis not present

## 2018-08-04 NOTE — Assessment & Plan Note (Signed)
Blood glucose range between 94 to 153 in the morning with Victoza initiation. Patient seem to be tolerating medication well no reported hypoglycemic event. Discuss with patient and daughter meaning of BG readings. Given his age and goal A1c, will not make any change to his current regimen. Patient will follow up in 2 months for A1c check. Expected to be closer to goal in the low 7.0

## 2018-08-04 NOTE — Patient Instructions (Signed)
It was great seeing you today! We have addressed the following issues today  1. Continue with your current medication regimen. Will recheck your A1c in 2 months. 2. Continue to record Blood sugar in the morning.  If we did any lab work today, and the results require attention, either me or my nurse will get in touch with you. If everything is normal, you will get a letter in mail and a message via . If you don't hear from [place] in two weeks, please give [place] a call. Otherwise, we look forward to seeing you again at your next visit. If you have any questions or concerns before then, please call the clinic at [phone].  Please bring all your medications to every doctors visit  Sign up for My Chart to have easy access to your labs results, and communication with your Primary care physician. Please ask Front Desk for some assistance.   Please check-out at the front desk before leaving the clinic.    Take Care,   Dr. Andy Gauss

## 2018-08-04 NOTE — Progress Notes (Signed)
Subjective:    Patient ID: Philip Matthews, male    DOB: 01-Mar-1928, 84 y.o.   MRN: [id number]   CC: T2DM follow up  HPI: Patient is a 84 yo male with a past medical history significant for HTN, T2DM, barrett's esophagus, hypothryoidism, CKD-3 here today to follow on on T2DM management. Patient was seen in clinic 2 weeks ago and found to have significant jump on A1c 7.0 to 8.9. Patient was previoulsy on Lantus and Novolog but it was discontinued due to hypoglycemic events reported by patient. Two weeks ago patient was started on Victoza and ask to record blood glucose for the next two weeks. Patient reports that he has been doing well. Blood glucose range from 94 to153 fasting in the morning. No hypoglycemic event reported. Patient recently seen by Nephrologist for his CKD-3 per daughter and told he was okay to be on Victoza. No polyuria or polydipsia reported.  Smoking status reviewed   ROS: all other systems were reviewed and are negative other than in the HPI   Past Medical History:  Diagnosis Date  . Anemia   . Arthritis    "joints" (08/27/2017)  . ARTHRITIS, LEFT SHOULDER 02/01/2010   Qualifier: Diagnosis of  By: Jacques Earthly    . Chronic kidney disease (CKD), stage IV (severe) ([place])   . Deaf   . Diverticulosis   . DIVERTICULOSIS OF COLON 03/08/2010   Qualifier: Diagnosis of  By: Jacques Earthly    . Esophagitis    HSV  . Gout   . History of DVT (deep vein thrombosis)   . Hyperlipidemia   . Hypertension   . Hypothyroidism   . Kidney mass    lipoma  . Liver nodule    u/s 310  . Portal vein thrombosis    on life long coumadin  . PUD, HX OF 03/08/2010   Qualifier: History of  By: Jacques Earthly    . Type II diabetes mellitus ([place])     Past Surgical History:  Procedure Laterality Date  . COLON SURGERY     "something was twisted; that messed w/my BM; had to have it untwisted"  . HERNIA REPAIR    . LIVER LOBECTOMY    . SHOULDER ARTHROCENTESIS Left     Past  medical history, surgical, family, and social history reviewed and updated in the EMR as appropriate.  Objective:  BP 130/64   Pulse 65   Temp (!) 97.5 F (36.4 C) (Oral)   Ht 5\' 4"  (1.626 m)   Wt 121 lb (54.9 kg)   BMI 20.77 kg/m   Vitals and nursing note reviewed  General: NAD, pleasant, able to participate in exam Cardiac: RRR, normal heart sounds, no murmurs. 2+ radial and PT pulses bilaterally Respiratory: CTAB, normal effort, No wheezes, rales or rhonchi Abdomen: soft, nontender, nondistended, no hepatic or splenomegaly, +BS Extremities: no edema or cyanosis. WWP. Skin: warm and dry, no rashes noted Neuro: alert and oriented x4, no focal deficits Psych: Normal affect and mood   Assessment & Plan:    Type II diabetes mellitus, controlled but with peripheral neuropathy Blood glucose range between 94 to 153 in the morning with Victoza initiation. Patient seem to be tolerating medication well no reported hypoglycemic event. Discuss with patient and daughter meaning of BG readings. Given his age and goal A1c, will not make any change to his current regimen. Patient will follow up in 2 months for A1c check. Expected to be closer to goal  in the low 7.0   Marjie Skiff, MD [place] PGY-3

## 2018-08-10 DIAGNOSIS — N183 Chronic kidney disease, stage 3 (moderate): Secondary | ICD-10-CM | POA: Diagnosis not present

## 2018-08-10 DIAGNOSIS — D649 Anemia, unspecified: Secondary | ICD-10-CM | POA: Diagnosis not present

## 2018-08-10 DIAGNOSIS — Z5181 Encounter for therapeutic drug level monitoring: Secondary | ICD-10-CM | POA: Diagnosis not present

## 2018-08-17 ENCOUNTER — Other Ambulatory Visit: Payer: Self-pay

## 2018-08-17 DIAGNOSIS — E038 Other specified hypothyroidism: Secondary | ICD-10-CM

## 2018-08-17 DIAGNOSIS — I10 Essential (primary) hypertension: Secondary | ICD-10-CM

## 2018-08-18 MED ORDER — LEVOTHYROXINE SODIUM 75 MCG PO TABS: 75.0000 ug | ORAL_TABLET | Freq: Every day | ORAL | 0 refills | Status: AC

## 2018-08-18 MED ORDER — ALLOPURINOL 100 MG PO TABS: ORAL_TABLET | ORAL | 1 refills | Status: AC

## 2018-08-18 MED ORDER — VITAMIN B-12 100 MCG PO TABS: 100.0000 ug | ORAL_TABLET | Freq: Every day | ORAL | 0 refills | Status: DC

## 2018-08-18 MED ORDER — PANTOPRAZOLE SODIUM 20 MG PO TBEC: 20.0000 mg | DELAYED_RELEASE_TABLET | Freq: Two times a day (BID) | ORAL | 0 refills | Status: DC

## 2018-08-18 MED ORDER — HYDRALAZINE HCL 50 MG PO TABS: ORAL_TABLET | ORAL | 3 refills | Status: AC

## 2018-08-18 MED ORDER — PRAVASTATIN SODIUM 40 MG PO TABS: ORAL_TABLET | ORAL | 1 refills | Status: AC

## 2018-08-18 MED ORDER — CALCIUM CARBONATE-VITAMIN D 500-400 MG-UNIT PO TABS: 1.0000 | ORAL_TABLET | Freq: Two times a day (BID) | ORAL | 0 refills | Status: DC

## 2018-08-18 MED ORDER — FERROUS SULFATE 325 (65 FE) MG PO TABS
325.0000 mg | ORAL_TABLET | Freq: Every day | ORAL | 0 refills | Status: DC
Start: 2018-08-18 — End: 2018-09-10

## 2018-08-20 ENCOUNTER — Encounter: Payer: Self-pay | Admitting: Family Medicine

## 2018-08-24 ENCOUNTER — Ambulatory Visit (INDEPENDENT_AMBULATORY_CARE_PROVIDER_SITE_OTHER): Payer: Medicare Other | Admitting: Family Medicine

## 2018-08-24 ENCOUNTER — Encounter: Payer: Self-pay | Admitting: Family Medicine

## 2018-08-24 ENCOUNTER — Telehealth: Payer: Self-pay | Admitting: *Deleted

## 2018-08-24 ENCOUNTER — Other Ambulatory Visit: Payer: Self-pay

## 2018-08-24 VITALS — BP 142/76 | HR 69 | Temp 97.5°F | Wt 121.0 lb

## 2018-08-24 DIAGNOSIS — R600 Localized edema: Secondary | ICD-10-CM

## 2018-08-24 DIAGNOSIS — D649 Anemia, unspecified: Secondary | ICD-10-CM | POA: Diagnosis not present

## 2018-08-24 DIAGNOSIS — Z5181 Encounter for therapeutic drug level monitoring: Secondary | ICD-10-CM | POA: Diagnosis not present

## 2018-08-24 DIAGNOSIS — N183 Chronic kidney disease, stage 3 (moderate): Secondary | ICD-10-CM | POA: Diagnosis not present

## 2018-08-24 NOTE — Patient Instructions (Addendum)
It was good to see you today!  Watch the salt in your diet. Wear compression stockings, put them on first thing in the morning. Keep your feet up when sitting or laying down. Walking is good.   We are checking some labs today. If results require attention, either myself or my nurse will get in touch with you. If everything is normal, you will get a letter in the mail or a message in My Chart. Please give [place] a call if you do not hear from [place] after 2 weeks.   Let us know if this get worse or if you have any skin breakdown or difficulty breathing.   Please bring all of your medications with you to each visit.   Sign up for My Chart to have easy access to your labs results, and communication with your primary care physician.  Feel free to call with any questions or concerns at any time, at [phone].   Take care,  Dr. Bufford Lope, [place]

## 2018-08-24 NOTE — Assessment & Plan Note (Signed)
Per chart review this is a long-standing problem for patient.  May be due to his amlodipine.  No signs or symptoms of heart failure and he had a recent echocardiogram in March that showed a normal EF and only grade 1 diastolic dysfunction.  His TSH was also checked at that time and was normal.  Discussed conservative management with leg elevation, low-salt diet, and use of compression stockings.  Offered patient trial off amlodipine however this would likely mean he would need to increase of his other blood pressure medications versus a new agent.  Patient opted for conservative management at this time.  Given return precautions.  Will check BMP today

## 2018-08-24 NOTE — Telephone Encounter (Signed)
[facility]  Pts levothyroxine is on backorder from normal manufacturer, the pharmacy needs permission to change manufacturers.    They are leaving town tomorrow and need this done ASAP as they prepare his medications. Philip Matthews, Philip Matthews, CMA

## 2018-08-24 NOTE — Progress Notes (Signed)
    Subjective:  Philip Matthews is a 84 y.o. male who presents to the [facility] today with a chief complaint of leg swelling.  History taken via in person sign language interpreter.  Patient is accompanied by family member  HPI:  Has been having acute on chronic leg swelling over the last 2 weeks.  In both legs, his left ankle is always been bigger than the right ever since he broke it a long time ago.  He feels like the swelling has affected both legs equally. No redness or warmth.  Does have a little shooting pains in his feet when he walks occasionally but none today and feels like it is not associated with the leg swelling. No rashes or skin breakdown or drainage No chest pain or shortness of breath or cough Has been eating a high salt diet Has been compliant on his home blood pressure medicines and Lasix Has not been wearing his compression stockings   ROS: Per HPI  PMH: He reports that he quit smoking about 7 years ago. His smoking use included cigarettes. He has never used smokeless tobacco. He reports that he does not drink alcohol or use drugs.   Objective:  Physical Exam: BP (!) 152/78   Pulse 69   Temp (!) 97.5 F (36.4 C) (Oral)   Wt 121 lb (54.9 kg)   SpO2 98%   BMI 20.77 kg/m  BP recheck: 142/76 Gen: NAD, resting comfortably CV: RRR with no murmurs appreciated Pulm: NWOB, CTAB with no crackles, wheezes, or rhonchi MSK: 2+ pitting edema to mid shin bilaterally. L ankle greater in size than R. No warmth or redness. Neg homans, no palpable cord. Skin: warm, dry Neuro: grossly normal, moves all extremities   Assessment/Plan:  Bilateral leg edema Per chart review this is a long-standing problem for patient.  May be due to his amlodipine.  No signs or symptoms of heart failure and he had a recent echocardiogram in March that showed a normal EF and only grade 1 diastolic dysfunction.  His TSH was also checked at that time and was normal.  Discussed conservative management  with leg elevation, low-salt diet, and use of compression stockings.  Offered patient trial off amlodipine however this would likely mean he would need to increase of his other blood pressure medications versus a new agent.  Patient opted for conservative management at this time.  Given return precautions.  Will check BMP today   Bufford Lope, DO PGY-3, [facility] 08/24/2018 1:56 PM

## 2018-08-24 NOTE — Telephone Encounter (Signed)
Spoke with [facility] who stated they were able to fill his prescription through a different wholesaler and did not require further action from [facility].  Rory Percy, DO PGY-2, [facility] 08/24/2018 5:17 PM

## 2018-08-25 ENCOUNTER — Encounter (HOSPITAL_COMMUNITY): Payer: Self-pay | Admitting: *Deleted

## 2018-08-25 ENCOUNTER — Inpatient Hospital Stay (HOSPITAL_COMMUNITY)
Admission: EM | Admit: 2018-08-25 | Discharge: 2018-08-31 | DRG: 641 | Disposition: A | Discharge status: Home Health | Payer: Medicare Other | Attending: Family Medicine | Admitting: Family Medicine

## 2018-08-25 ENCOUNTER — Other Ambulatory Visit: Payer: Self-pay

## 2018-08-25 DIAGNOSIS — E872 Acidosis: Secondary | ICD-10-CM | POA: Diagnosis present

## 2018-08-25 DIAGNOSIS — Z91138 Patient's unintentional underdosing of medication regimen for other reason: Secondary | ICD-10-CM

## 2018-08-25 DIAGNOSIS — H401190 Primary open-angle glaucoma, unspecified eye, stage unspecified: Secondary | ICD-10-CM | POA: Diagnosis present

## 2018-08-25 DIAGNOSIS — N2589 Other disorders resulting from impaired renal tubular function: Secondary | ICD-10-CM | POA: Diagnosis present

## 2018-08-25 DIAGNOSIS — N183 Chronic kidney disease, stage 3 (moderate): Secondary | ICD-10-CM

## 2018-08-25 DIAGNOSIS — R001 Bradycardia, unspecified: Secondary | ICD-10-CM | POA: Diagnosis present

## 2018-08-25 DIAGNOSIS — E1122 Type 2 diabetes mellitus with diabetic chronic kidney disease: Secondary | ICD-10-CM | POA: Diagnosis present

## 2018-08-25 DIAGNOSIS — D631 Anemia in chronic kidney disease: Secondary | ICD-10-CM | POA: Diagnosis present

## 2018-08-25 DIAGNOSIS — I160 Hypertensive urgency: Secondary | ICD-10-CM | POA: Diagnosis not present

## 2018-08-25 DIAGNOSIS — Y92009 Unspecified place in unspecified non-institutional (private) residence as the place of occurrence of the external cause: Secondary | ICD-10-CM

## 2018-08-25 DIAGNOSIS — Z86718 Personal history of other venous thrombosis and embolism: Secondary | ICD-10-CM | POA: Diagnosis not present

## 2018-08-25 DIAGNOSIS — Z8249 Family history of ischemic heart disease and other diseases of the circulatory system: Secondary | ICD-10-CM

## 2018-08-25 DIAGNOSIS — E875 Hyperkalemia: Secondary | ICD-10-CM | POA: Diagnosis not present

## 2018-08-25 DIAGNOSIS — Z87891 Personal history of nicotine dependence: Secondary | ICD-10-CM

## 2018-08-25 DIAGNOSIS — Z7984 Long term (current) use of oral hypoglycemic drugs: Secondary | ICD-10-CM | POA: Diagnosis not present

## 2018-08-25 DIAGNOSIS — H9193 Unspecified hearing loss, bilateral: Secondary | ICD-10-CM | POA: Diagnosis present

## 2018-08-25 DIAGNOSIS — E039 Hypothyroidism, unspecified: Secondary | ICD-10-CM | POA: Diagnosis present

## 2018-08-25 DIAGNOSIS — E782 Mixed hyperlipidemia: Secondary | ICD-10-CM | POA: Diagnosis present

## 2018-08-25 DIAGNOSIS — I440 Atrioventricular block, first degree: Secondary | ICD-10-CM | POA: Diagnosis present

## 2018-08-25 DIAGNOSIS — N289 Disorder of kidney and ureter, unspecified: Secondary | ICD-10-CM | POA: Diagnosis not present

## 2018-08-25 DIAGNOSIS — K5900 Constipation, unspecified: Secondary | ICD-10-CM | POA: Diagnosis present

## 2018-08-25 DIAGNOSIS — Z79890 Hormone replacement therapy (postmenopausal): Secondary | ICD-10-CM | POA: Diagnosis not present

## 2018-08-25 DIAGNOSIS — Z8042 Family history of malignant neoplasm of prostate: Secondary | ICD-10-CM | POA: Diagnosis not present

## 2018-08-25 DIAGNOSIS — T501X6A Underdosing of loop [high-ceiling] diuretics, initial encounter: Secondary | ICD-10-CM | POA: Diagnosis present

## 2018-08-25 DIAGNOSIS — R600 Localized edema: Secondary | ICD-10-CM | POA: Diagnosis present

## 2018-08-25 DIAGNOSIS — N184 Chronic kidney disease, stage 4 (severe): Secondary | ICD-10-CM | POA: Diagnosis present

## 2018-08-25 DIAGNOSIS — T503X6A Underdosing of electrolytic, caloric and water-balance agents, initial encounter: Secondary | ICD-10-CM | POA: Diagnosis present

## 2018-08-25 DIAGNOSIS — M109 Gout, unspecified: Secondary | ICD-10-CM | POA: Diagnosis present

## 2018-08-25 DIAGNOSIS — I129 Hypertensive chronic kidney disease with stage 1 through stage 4 chronic kidney disease, or unspecified chronic kidney disease: Secondary | ICD-10-CM | POA: Diagnosis present

## 2018-08-25 LAB — BASIC METABOLIC PANEL
Anion gap: 8 (ref 5–15)
Anion gap: 5 (ref 5–15)
Anion gap: 6 (ref 5–15)
BUN/Creatinine Ratio: 30 — ABNORMAL HIGH (ref 10–24)
BUN: 52 mg/dL — AB (ref 8–27)
BUN: 48 mg/dL — AB (ref 8–23)
BUN: 46 mg/dL — ABNORMAL HIGH (ref 8–23)
BUN: 43 mg/dL — ABNORMAL HIGH (ref 8–23)
CHLORIDE: 117 mmol/L — AB (ref 98–111)
CHLORIDE: 115 mmol/L — AB (ref 98–111)
CHLORIDE: 114 mmol/L — AB (ref 98–111)
CO2: 17 mmol/L — AB (ref 20–29)
CO2: 19 mmol/L — AB (ref 22–32)
CO2: 23 mmol/L (ref 22–32)
CO2: 22 mmol/L (ref 22–32)
CREATININE: 1.75 mg/dL — AB (ref 0.76–1.27)
CREATININE: 1.61 mg/dL — AB (ref 0.61–1.24)
CREATININE: 1.6 mg/dL — AB (ref 0.61–1.24)
Calcium: 8.9 mg/dL (ref 8.6–10.2)
Calcium: 9.1 mg/dL (ref 8.9–10.3)
Calcium: 8.8 mg/dL — ABNORMAL LOW (ref 8.9–10.3)
Calcium: 8.4 mg/dL — ABNORMAL LOW (ref 8.9–10.3)
Chloride: 113 mmol/L — ABNORMAL HIGH (ref 96–106)
Creatinine, Ser: 1.72 mg/dL — ABNORMAL HIGH (ref 0.61–1.24)
GFR calc Af Amer: 40 mL/min/{1.73_m2} — ABNORMAL LOW (ref >59)
GFR calc Af Amer: 39 mL/min — ABNORMAL LOW (ref >60)
GFR calc Af Amer: 43 mL/min — ABNORMAL LOW (ref >60)
GFR calc non Af Amer: 34 mL/min — ABNORMAL LOW (ref >60)
GFR calc non Af Amer: 37 mL/min — ABNORMAL LOW (ref >60)
GFR calc non Af Amer: 37 mL/min — ABNORMAL LOW (ref >60)
GFR, EST AFRICAN AMERICAN: 43 mL/min — AB (ref >60)
GFR, EST NON AFRICAN AMERICAN: 34 mL/min/{1.73_m2} — AB (ref >59)
GLUCOSE: 83 mg/dL (ref 70–99)
GLUCOSE: 201 mg/dL — AB (ref 70–99)
GLUCOSE: 408 mg/dL — AB (ref 70–99)
Glucose: 64 mg/dL — ABNORMAL LOW (ref 65–99)
Potassium: 7.5 mmol/L (ref 3.5–5.2)
Potassium: 5.4 mmol/L — ABNORMAL HIGH (ref 3.5–5.1)
Potassium: 6.5 mmol/L (ref 3.5–5.1)
Potassium: 4.9 mmol/L (ref 3.5–5.1)
Sodium: 142 mmol/L (ref 134–144)
Sodium: 144 mmol/L (ref 135–145)
Sodium: 143 mmol/L (ref 135–145)
Sodium: 142 mmol/L (ref 135–145)

## 2018-08-25 LAB — CBC WITH DIFFERENTIAL/PLATELET
ABS IMMATURE GRANULOCYTES: 0 10*3/uL (ref 0.0–0.1)
Basophils Absolute: 0 10*3/uL (ref 0.0–0.1)
Basophils Relative: 1 %
Eosinophils Absolute: 0.3 10*3/uL (ref 0.0–0.7)
Eosinophils Relative: 6 %
HEMATOCRIT: 37.9 % — AB (ref 39.0–52.0)
HEMOGLOBIN: 11.3 g/dL — AB (ref 13.0–17.0)
IMMATURE GRANULOCYTES: 0 %
LYMPHS ABS: 1.2 10*3/uL (ref 0.7–4.0)
LYMPHS PCT: 26 %
MCH: 27.2 pg (ref 26.0–34.0)
MCHC: 29.8 g/dL — ABNORMAL LOW (ref 30.0–36.0)
MCV: 91.3 fL (ref 78.0–100.0)
Monocytes Absolute: 0.4 10*3/uL (ref 0.1–1.0)
Monocytes Relative: 8 %
NEUTROS ABS: 2.6 10*3/uL (ref 1.7–7.7)
NEUTROS PCT: 59 %
Platelets: DECREASED 10*3/uL (ref 150–400)
RBC: 4.15 MIL/uL — AB (ref 4.22–5.81)
RDW: 19.3 % — ABNORMAL HIGH (ref 11.5–15.5)
WBC: 4.4 10*3/uL (ref 4.0–10.5)

## 2018-08-25 LAB — COMPREHENSIVE METABOLIC PANEL
ALBUMIN: 3.5 g/dL (ref 3.5–5.0)
ALK PHOS: 67 U/L (ref 38–126)
ALT: 39 U/L (ref 0–44)
AST: 54 U/L — AB (ref 15–41)
Anion gap: 6 (ref 5–15)
BILIRUBIN TOTAL: 0.5 mg/dL (ref 0.3–1.2)
BUN: 53 mg/dL — AB (ref 8–23)
CO2: 20 mmol/L — ABNORMAL LOW (ref 22–32)
Calcium: 8.9 mg/dL (ref 8.9–10.3)
Chloride: 115 mmol/L — ABNORMAL HIGH (ref 98–111)
Creatinine, Ser: 1.91 mg/dL — ABNORMAL HIGH (ref 0.61–1.24)
GFR calc Af Amer: 35 mL/min — ABNORMAL LOW (ref >60)
GFR calc non Af Amer: 30 mL/min — ABNORMAL LOW (ref >60)
GLUCOSE: 80 mg/dL (ref 70–99)
Potassium: 6.5 mmol/L (ref 3.5–5.1)
Sodium: 141 mmol/L (ref 135–145)
TOTAL PROTEIN: 7 g/dL (ref 6.5–8.1)

## 2018-08-25 LAB — GLUCOSE, CAPILLARY
GLUCOSE-CAPILLARY: 86 mg/dL (ref 70–99)
Glucose-Capillary: 149 mg/dL — ABNORMAL HIGH (ref 70–99)

## 2018-08-25 LAB — CBG MONITORING, ED
Glucose-Capillary: 60 mg/dL — ABNORMAL LOW (ref 70–99)
Glucose-Capillary: 236 mg/dL — ABNORMAL HIGH (ref 70–99)
Glucose-Capillary: 152 mg/dL — ABNORMAL HIGH (ref 70–99)

## 2018-08-25 LAB — MRSA PCR SCREENING: MRSA by PCR: NEGATIVE

## 2018-08-25 MED ORDER — BRIMONIDINE TARTRATE 0.2 % OP SOLN
1.0000 [drp] | Freq: Two times a day (BID) | OPHTHALMIC | Status: DC
  Administered 2018-08-25 – 2018-08-31 (×11): 1 [drp] via OPHTHALMIC
  Filled 2018-08-25 (×2): qty 5

## 2018-08-25 MED ORDER — INSULIN ASPART 100 UNIT/ML ~~LOC~~ SOLN
0.0000 [IU] | Freq: Three times a day (TID) | SUBCUTANEOUS | Status: DC
  Administered 2018-08-28: 2 [IU] via SUBCUTANEOUS

## 2018-08-25 MED ORDER — SODIUM POLYSTYRENE SULFONATE 15 GM/60ML PO SUSP
30.0000 g | Freq: Once | ORAL | Status: AC
  Administered 2018-08-25: 30 g via ORAL
  Filled 2018-08-25: qty 120

## 2018-08-25 MED ORDER — ALLOPURINOL 100 MG PO TABS
100.0000 mg | ORAL_TABLET | Freq: Every day | ORAL | Status: DC
  Administered 2018-08-25 – 2018-08-31 (×7): 100 mg via ORAL
  Filled 2018-08-25 (×7): qty 1

## 2018-08-25 MED ORDER — CALCIUM GLUCONATE 10 % IV SOLN: 1.0000 g | Freq: Once | INTRAVENOUS | Status: DC

## 2018-08-25 MED ORDER — TIMOLOL MALEATE 0.5 % OP SOLN
1.0000 [drp] | Freq: Two times a day (BID) | OPHTHALMIC | Status: DC
  Administered 2018-08-25 – 2018-08-31 (×12): 1 [drp] via OPHTHALMIC
  Filled 2018-08-25 (×2): qty 5

## 2018-08-25 MED ORDER — SODIUM CHLORIDE 0.9 % IV SOLN
1.0000 g | Freq: Once | INTRAVENOUS | Status: AC
  Administered 2018-08-25: 1 g via INTRAVENOUS
  Filled 2018-08-25: qty 10

## 2018-08-25 MED ORDER — INSULIN ASPART 100 UNIT/ML IV SOLN
10.0000 [IU] | Freq: Once | INTRAVENOUS | Status: AC
  Administered 2018-08-25: 10 [IU] via INTRAVENOUS

## 2018-08-25 MED ORDER — INSULIN ASPART 100 UNIT/ML ~~LOC~~ SOLN: 10.0000 [IU] | Freq: Once | SUBCUTANEOUS | Status: DC

## 2018-08-25 MED ORDER — CALCITRIOL 0.25 MCG PO CAPS
0.2500 ug | ORAL_CAPSULE | ORAL | Status: DC
  Administered 2018-08-26 – 2018-08-31 (×3): 0.25 ug via ORAL
  Filled 2018-08-25 (×5): qty 1

## 2018-08-25 MED ORDER — SODIUM BICARBONATE 650 MG PO TABS
650.0000 mg | ORAL_TABLET | Freq: Two times a day (BID) | ORAL | Status: DC
  Administered 2018-08-25 – 2018-08-31 (×12): 650 mg via ORAL
  Filled 2018-08-25 (×12): qty 1

## 2018-08-25 MED ORDER — INSULIN ASPART 100 UNIT/ML ~~LOC~~ SOLN
5.0000 [IU] | Freq: Once | SUBCUTANEOUS | Status: AC
  Administered 2018-08-25: 5 [IU] via INTRAVENOUS
  Filled 2018-08-25: qty 1

## 2018-08-25 MED ORDER — BRIMONIDINE TARTRATE-TIMOLOL 0.2-0.5 % OP SOLN: 1.0000 [drp] | Freq: Two times a day (BID) | OPHTHALMIC | Status: DC

## 2018-08-25 MED ORDER — DEXTROSE 50 % IV SOLN
1.0000 | Freq: Once | INTRAVENOUS | Status: AC
  Administered 2018-08-25: 50 mL via INTRAVENOUS
  Filled 2018-08-25: qty 50

## 2018-08-25 MED ORDER — SODIUM BICARBONATE 8.4 % IV SOLN
50.0000 meq | Freq: Once | INTRAVENOUS | Status: AC
  Administered 2018-08-25: 50 meq via INTRAVENOUS
  Filled 2018-08-25: qty 50

## 2018-08-25 MED ORDER — LATANOPROST 0.005 % OP SOLN
1.0000 [drp] | Freq: Every day | OPHTHALMIC | Status: DC
  Administered 2018-08-25 – 2018-08-30 (×5): 1 [drp] via OPHTHALMIC
  Filled 2018-08-25 (×2): qty 2.5

## 2018-08-25 MED ORDER — PRAVASTATIN SODIUM 40 MG PO TABS
40.0000 mg | ORAL_TABLET | Freq: Every day | ORAL | Status: DC
  Administered 2018-08-25 – 2018-08-30 (×6): 40 mg via ORAL
  Filled 2018-08-25 (×6): qty 1

## 2018-08-25 MED ORDER — ACETAMINOPHEN 500 MG PO TABS: 500.0000 mg | ORAL_TABLET | Freq: Four times a day (QID) | ORAL | Status: DC | PRN

## 2018-08-25 MED ORDER — TIMOLOL MALEATE 0.5 % OP SOLN: 1.0000 [drp] | OPHTHALMIC | Status: DC

## 2018-08-25 MED ORDER — FUROSEMIDE 40 MG PO TABS
40.0000 mg | ORAL_TABLET | Freq: Once | ORAL | Status: AC
  Administered 2018-08-25: 40 mg via ORAL
  Filled 2018-08-25: qty 1

## 2018-08-25 MED ORDER — ENOXAPARIN SODIUM 30 MG/0.3ML ~~LOC~~ SOLN
30.0000 mg | SUBCUTANEOUS | Status: DC
  Administered 2018-08-25 – 2018-08-28 (×4): 30 mg via SUBCUTANEOUS
  Filled 2018-08-25 (×4): qty 0.3

## 2018-08-25 MED ORDER — LEVOTHYROXINE SODIUM 75 MCG PO TABS
75.0000 ug | ORAL_TABLET | Freq: Every day | ORAL | Status: DC
  Administered 2018-08-26 – 2018-08-31 (×6): 75 ug via ORAL
  Filled 2018-08-25 (×6): qty 1

## 2018-08-25 MED ORDER — SODIUM CHLORIDE 0.9% FLUSH
3.0000 mL | Freq: Two times a day (BID) | INTRAVENOUS | Status: DC
  Administered 2018-08-25 – 2018-08-31 (×10): 3 mL via INTRAVENOUS

## 2018-08-25 MED ORDER — HYDRALAZINE HCL 50 MG PO TABS
50.0000 mg | ORAL_TABLET | Freq: Three times a day (TID) | ORAL | Status: DC
  Administered 2018-08-25 – 2018-08-31 (×18): 50 mg via ORAL
  Filled 2018-08-25 (×18): qty 1

## 2018-08-25 MED ORDER — ALBUTEROL SULFATE (2.5 MG/3ML) 0.083% IN NEBU
10.0000 mg | INHALATION_SOLUTION | Freq: Once | RESPIRATORY_TRACT | Status: AC
  Administered 2018-08-25: 10 mg via RESPIRATORY_TRACT
  Filled 2018-08-25: qty 12

## 2018-08-25 NOTE — Telephone Encounter (Signed)
Spoke to daughter who stated patient was not experiencing chest pain, SOB. Informed her of lab results and advised she take him to the ED. She verbalized understanding and plans to take him this morning. ED triage nurse informed.  Rory Percy, DO PGY-2, [facility] 08/25/2018 7:39 AM

## 2018-08-25 NOTE — ED Notes (Signed)
This RN and Hope, RN attempted to gain IV access x2 unsuccessfully.

## 2018-08-25 NOTE — ED Provider Notes (Addendum)
[facility] EMERGENCY DEPARTMENT Provider Note   CSN: [id number] Arrival date & time: 08/25/18  1275     History   Chief Complaint Chief Complaint  Patient presents with  . Abnormal Lab    HPI Philip Matthews is a 84 y.o. male.  HPI Patient is an 84 year old male who has recently run out of his diuretic presents to the emergency department increasing lower extremity swelling over the past several days.  He was seen by his primary care team yesterday in the office and routine blood work was obtained.  He was contacted by the family medicine service this morning and told that his potassium was 7.5 and that he should come to the emergency department.  He reports no shortness of breath.  Ongoing lower extremity swelling.  He states he recently ran out of his diuretics.  He does not take any potassium supplementation.  Previously he was prescribed Kayexalate to take 3 times a week but he has not taken this in some time.   Past Medical History:  Diagnosis Date  . Anemia   . Arthritis    "joints" (08/27/2017)  . ARTHRITIS, LEFT SHOULDER 02/01/2010   Qualifier: Diagnosis of  By: Jacques Earthly    . Chronic kidney disease (CKD), stage IV (severe) ([place])   . Deaf   . Diverticulosis   . DIVERTICULOSIS OF COLON 03/08/2010   Qualifier: Diagnosis of  By: Jacques Earthly    . Esophagitis    HSV  . Gout   . History of DVT (deep vein thrombosis)   . Hyperlipidemia   . Hypertension   . Hypothyroidism   . Kidney mass    lipoma  . Liver nodule    u/s 310  . Portal vein thrombosis    on life long coumadin  . PUD, HX OF 03/08/2010   Qualifier: History of  By: Jacques Earthly    . Type II diabetes mellitus [facility])     Patient Active Problem List   Diagnosis Date Noted  . Syncope 03/20/2018  . Hypoglycemia   . Melena   . GI bleed 08/28/2017  . Bilateral deafness   . Uses visual frame sign language interpreter   . Right thigh pain 07/24/2017  . Neoplasm, uncertain  whether benign or malignant 04/15/2017  . Osteoarthritis of left shoulder 04/07/2017  . Health care maintenance 10/23/2016  . Chronic anticoagulation 10/23/2016  . Elevated LFTs 03/18/2016  . Left shoulder pain 09/28/2015  . Hyperkalemia 11/11/2012  . Allergic rhinitis 09/08/2012  . Primary open-angle glaucoma 08/05/2012  . Hypothyroid 06/17/2012  . Bilateral leg edema 05/30/2012  . BARRETTS ESOPHAGUS 07/11/2010  . Mixed hyperlipidemia 06/20/2010  . Portal vein thrombosis 06/08/2010  . Gout 06/07/2010  . LIVER MASS 03/08/2010  . LUNG NODULE 03/08/2010  . Type II diabetes mellitus, controlled but with peripheral neuropathy 02/01/2010  . Anemia in chronic kidney disease 02/01/2010  . Isolated systolic HTN, goal SBP <170.  02/01/2010  . Chronic kidney disease (CKD), stage III (moderate) ([place]) 02/01/2010    Past Surgical History:  Procedure Laterality Date  . COLON SURGERY     "something was twisted; that messed w/my BM; had to have it untwisted"  . HERNIA REPAIR    . LIVER LOBECTOMY    . SHOULDER ARTHROCENTESIS Left      OB History   None      Home Medications    Prior to Admission medications   Medication Sig Start Date End  Date Taking? Authorizing Provider  acetaminophen (TYLENOL) 500 MG tablet Take 500 mg by mouth every 6 (six) hours as needed (pain).    [provider]  allopurinol (ZYLOPRIM) 100 MG tablet take 1 tablet (100 mg) by mouth once daily 08/18/18   Rory Percy, DO  Blood Glucose Monitoring Suppl (ONE TOUCH ULTRA 2) w/Device KIT 1 Device by Does not apply route once. 03/18/16   Olam Idler, MD  brimonidine-timolol (COMBIGAN) 0.2-0.5 % ophthalmic solution Place 1 drop into both eyes every 12 (twelve) hours.    [provider]  calcitRIOL (ROCALTROL) 0.25 MCG capsule Take 1 capsule (0.25 mcg total) by mouth every Monday, Wednesday, and Friday. 04/22/18   Mayo, Pete Pelt, MD  calcium-vitamin D (OSCAL-500) 500-400 MG-UNIT tablet Take 1  tablet by mouth 2 (two) times daily. 08/18/18   Rory Percy, DO  diclofenac sodium (VOLTAREN) 1 % GEL Apply 4 g topically 4 (four) times daily. 04/21/18   Mayo, Pete Pelt, MD  docusate sodium (COLACE) 100 MG capsule Take 1 capsule (100 mg total) by mouth 2 (two) times daily as needed (constipation). Over the counter 04/21/18   Mayo, Pete Pelt, MD  epoetin alfa (PROCRIT) 84696 UNIT/ML injection Inject 1 mL (10,000 Units total) into the skin 3 (three) times a week. Patient taking differently: Inject 10,000 Units into the skin every 14 (fourteen) days.  09/24/11   Lyndal Pulley, DO  feeding supplement (BOOST / RESOURCE BREEZE) LIQD Take 1 Container by mouth 3 (three) times daily between meals. 08/29/17   Carlyle Dolly, MD  ferrous sulfate (FERRO-BOB) 325 (65 FE) MG tablet Take 1 tablet (325 mg total) by mouth daily. Over the counter 08/18/18   Rory Percy, DO  furosemide (LASIX) 40 MG tablet TAKE 1 TABLET BY MOUTH EVERY DAY 06/17/18   Mayo, Pete Pelt, MD  glucose blood (ONE TOUCH ULTRA TEST) test strip Check blood sugar three times daily 07/30/18   Rory Percy, DO  hydrALAZINE (APRESOLINE) 50 MG tablet take 1 tablet (88m) by mouth three times a day 08/18/18   RRory Percy DO  Lancets ([facility]) lancets Check sugar up to 6 x daily 07/10/18   RRory Percy DO  latanoprost (XALATAN) 0.005 % ophthalmic solution Place 1 drop into both eyes at bedtime.    [provider]  levothyroxine (SYNTHROID, LEVOTHROID) 75 MCG tablet Take 1 tablet (75 mcg total) by mouth daily. 08/18/18   RRory Percy DO  liraglutide (VICTOZA) 18 MG/3ML SOPN Inject 0.1 mLs (0.6 mg total) into the skin daily. 07/21/18 08/20/18  RRory Percy DO  Multiple Vitamin (GNP ESSENTIAL ONE DAILY) TABS TAKE 1 TABLET BY MOUTH EVERY DAY 06/01/18   Mayo, KPete Pelt MD  Multiple Vitamin (MULTIVITAMIN WITH MINERALS) TABS tablet Take 1 tablet by mouth daily. Over the counter    [provider]  [email or web address] each by Does not apply route every morning. 03/29/15   JOlam Idler MD  pantoprazole (PROTONIX) 20 MG tablet Take 1 tablet (20 mg total) by mouth 2 (two) times daily. 08/18/18   RRory Percy DO  polyethylene glycol powder (GLYCOLAX/MIRALAX) powder Take 17 g by mouth 2 (two) times daily. Hold for loose stools 04/21/18   Mayo, KPete Pelt MD  pravastatin (PRAVACHOL) 40 MG tablet take 1 tablet (40 mg) by mouth daily at bedtime 08/18/18   RRory Percy DO  sodium bicarbonate 650 MG tablet Take 1 tablet (650 mg total) by mouth 2 (two) times daily.  07/16/13   Marin Olp, MD  sodium polystyrene (KAYEXALATE) 15 GM/60ML suspension Take 60 mLs (15 g total) by mouth once. 3x weekly (tu/th/sat) to prevent high potassium. Patient taking differently: Take 15 g by mouth See admin instructions. Take 60 ml (15 g) by mouth three times weekly (Tuesday, Thursday, Saturday) to prevent high potassium. 07/16/13   Marin Olp, MD  timolol (TIMOPTIC) 0.5 % ophthalmic solution Place 1 drop into both eyes as directed. 03/23/18   Sherene Sires, DO  vitamin B-12 (CYANOCOBALAMIN) 100 MCG tablet Take 1 tablet (100 mcg total) by mouth daily. 08/18/18   Rory Percy, DO    Family History Family History  Problem Relation Age of Onset  . Cancer Father        prostate  . Heart disease Mother     Social History Social History   Tobacco Use  . Smoking status: Former Smoker    Types: Cigarettes    Last attempt to quit: 01/18/2011    Years since quitting: 7.6  . Smokeless tobacco: Never Used  . Tobacco comment: 08/27/2017 "didn't smoke alot; can't tell you how long I smoked"  Substance Use Topics  . Alcohol use: No  . Drug use: No     Allergies   Patient has no known allergies.   Review of Systems Review of Systems  All other systems reviewed and are negative.    Physical Exam Updated Vital Signs BP (!) 189/80   Pulse (!) 56   Resp 15   SpO2 100%   Physical Exam    Constitutional: He is oriented to person, place, and time. He appears well-developed and well-nourished.  HENT:  Head: Normocephalic and atraumatic.  Eyes: EOM are normal.  Neck: Normal range of motion.  Cardiovascular: Normal rate, regular rhythm and normal heart sounds.  Pulmonary/Chest: Effort normal and breath sounds normal. No respiratory distress.  Abdominal: Soft. He exhibits no distension. There is no tenderness.  Musculoskeletal: Normal range of motion. He exhibits edema.  Neurological: He is alert and oriented to person, place, and time.  Skin: Skin is warm and dry.  Psychiatric: He has a normal mood and affect. Judgment normal.  Nursing note and vitals reviewed.    ED Treatments / Results  Labs (all labs ordered are listed, but only abnormal results are displayed) Labs Reviewed  CBC WITH DIFFERENTIAL/PLATELET - Abnormal; Notable for the following components:      Result Value   RBC 4.15 (*)    Hemoglobin 11.3 (*)    HCT 37.9 (*)    MCHC 29.8 (*)    RDW 19.3 (*)    All other components within normal limits  COMPREHENSIVE METABOLIC PANEL - Abnormal; Notable for the following components:   Potassium 6.5 (*)    Chloride 115 (*)    CO2 20 (*)    BUN 53 (*)    Creatinine, Ser 1.91 (*)    AST 54 (*)    GFR calc non Af Amer 30 (*)    GFR calc Af Amer 35 (*)    All other components within normal limits  CBG MONITORING, ED - Abnormal; Notable for the following components:   Glucose-Capillary 60 (*)    All other components within normal limits    EKG EKG Interpretation  Date/Time:  Tuesday August 25 2018 09:07:19 EDT Ventricular Rate:  50 PR Interval:  220 QRS Duration: 88 QT Interval:  444 QTC Calculation: 404 R Axis:   31 Text Interpretation:  Sinus bradycardia  with 1st degree A-V block Left ventricular hypertrophy with repolarization abnormality Abnormal ECG peaked t waves Confirmed by Jola Schmidt [phone]) on 08/25/2018 11:06:52 AM   Radiology No results  found.  Procedures .Critical Care Performed by: Jola Schmidt, MD Authorized by: Jola Schmidt, MD    CRITICAL CARE Performed by: Jola Schmidt Total critical care time: 33 minutes Critical care time was exclusive of separately billable procedures and treating other patients. Critical care was necessary to treat or prevent imminent or life-threatening deterioration. Critical care was time spent personally by me on the following activities: development of treatment plan with patient and/or surrogate as well as nursing, discussions with consultants, evaluation of patient's response to treatment, examination of patient, obtaining history from patient or surrogate, ordering and performing treatments and interventions, ordering and review of laboratory studies, ordering and review of radiographic studies, pulse oximetry and re-evaluation of patient's condition.   Medications Ordered in ED Medications  calcium gluconate 1 g in sodium chloride 0.9 % 100 mL IVPB (1 g Intravenous New Bag/Given 08/25/18 1059)  insulin aspart (novoLOG) injection 10 Units (has no administration in time range)  dextrose 50 % solution 50 mL (50 mLs Intravenous Given 08/25/18 1050)  sodium bicarbonate injection 50 mEq (50 mEq Intravenous Given 08/25/18 1050)     Initial Impression / Assessment and Plan / ED Course  I have reviewed the triage vital signs and the nursing notes.  Pertinent labs & imaging results that were available during my care of the patient were reviewed by me and considered in my medical decision making (see chart for details).     Repeat potassium today is 6.5.  Patient will be treated for hyperkalemia.  It is unclear if he is supposed to be on Kayexalate 3 times a week.  Also running out of his diuretic could theoretically lead to elevated potassium levels.  He has been treated at this time.  Patient is on cardiac monitor.  Family medicine to be consulted for admission.  We will plan on Kayexalate  now  Final Clinical Impressions(s) / ED Diagnoses   Final diagnoses:  None    ED Discharge Orders    None       Jola Schmidt, MD 08/25/18 Everetts, Ginia Rudell, MD 08/25/18 1152

## 2018-08-25 NOTE — ED Notes (Signed)
Critical value K 6.5. Notified Dr. Venora Maples.

## 2018-08-25 NOTE — Progress Notes (Signed)
Notified of critical potassium value of 6.5. Pt asymptomatic.   Plan:  -Obtain EKG -Novolog 10 units with D50  -Albuterol 10mg   -Recheck glucose in 1 hr and potassium in 3 hr with next BMP   Will continue to monitor closely.   Patriciaann Clan, DO  PGY-1 Family Medicine

## 2018-08-25 NOTE — H&P (Signed)
[facility] Admission History and Physical Service Pager: [phone]  Patient name: Philip Matthews Medical record number: [id number] Date of birth: 1928-03-29 Age: 84 y.o. Gender: male  Primary Care Provider: Rory Percy, DO Consultants: none Code Status: FULL confirmed with patient and daughter on admission   Chief Complaint: hyperkalemia  Assessment and Plan: Philip Matthews is a 84 y.o. male presenting with hyperkalemia, bradycardia. PMH is significant for distal RTA, osteoarthritis anemia, hyperkalemia, hypothyroidism, gout, type 2 diabetes, hypertension, deafness.   Hyperkalemia: suspect acute on chronic. Likely related to his distal RTA. Was supposed to be on kayexalate at home, daughter says he hasn't taken in 6 months. K 6.5 on admit, 7.5 in clinic day prior to admission. Does have peaked T waves (which have improved on zoll strip since admission EKG) with bradycardia. ED MD gave insulin, D50, calcium, bicarb, kayexalate. Will check K q4hrs.  -admit to stepdown, Dr. Mingo Amber attending -q4 hr BMPs -consider additional insulin with D50 and kayexalate -could consider lasix as patient takes this at home and Cr is at baseline  Bradycardia: profoundly bradycardic to low 30s intermittently. Patient mentating at reported baseline per daughter during these episodes.  No signs of hypotension.  Has been given treatment for his hyperkalemia, will closely monitor bradycardia for resolution with hyperkalemia.  Do question whether this is all due to his hyperkalemia.  If persistent, will need cardiology consult.  Is on calcium channel blocker at home, but no longer on beta-blocker after last admission. Curious whether thyroid could contribute, will recheck TSH.  -stepdown -consult cards if persistent despite K resolution  Anemia:Hgb 11.3; baseline hemoglobin 10. Likely due to CKD, currently on procrit with nephro. Hx of colonoscopy in 2011 with diverticulosis of colon    - Continue to follow  - Continue iron supplement   CKD stage 3: Baseline SCr 2.0 since 2018, Cr 1.91 on admit, stable from 03/21/18. Etiology seems to be distal RTA on chart review. Home lasix is 66m PO QD.  - BMPs as above - Continue to follow   T2DM: Hgb A1C 8.9 07/21/18. Home regimen includes victoza.  - Sensitive sliding scale  - hold victoza  Hypertension: hypertensive, but bradycardic. Hold meds until stabilization of HR. - hold all BP meds  Hypothyroidism last dose charted is 736m. Last TSH 3.35 03/20/18.  -  RepeatTSH  - Continue Synthroid   HLD  -continue statin  Primary open angle glaucoma -continue home drops  Gout on allopurinol at home.  - Continue Allopurinol   Memory decline - daughter reports he is confused at times since last admission. May benefit from dementia eval.  -consider outpatient geri appt vs MMSE  Care coordination patient is deaf and manages med with blister pack, but kayexalate obviously can't be put into blister pack and daughter notes he wasn't taking this. Was managing his own meds. They were also unsure of meds and doses. -pharmacy review -PT/OT eval -consider additional HH services   FEN/GI: heart healthy diet Prophylaxis: lovenox  Disposition: admit to stepdown 2/2 bradycardia  History of Present Illness:  Philip GREWELLs a [age].o. male presenting with hyperkalemia, bradycardia.   History is obtained from both patient and daughter.  Both are deaf and use sign language interpreters.  Video interpreter used.  Daughter states that at baseline, the patient minimizes his symptoms and will often say "I am fine.  "As well as "I will let you know.  "She also endorses that over the last few  months he has seemed more confused than baseline, her husband noticed this.  She thinks this began after his most recent admission back in March.  But she states that he does have a history of elevated potassium, and thinks he was supposed to be taking a  "brown liquid, "for this.  She states that he takes his meds from a blister pack, but obviously this liquid is not able to be incorporated to the blister pack.  She has been basically letting him manages on medicines as well as having a home health nurse that comes and checks on them.  She states that she feels like it is her fault that he was not taking this liquid for about 6 months, and she will closely monitor his medicines after this.  She states that he also has had intermittent foot swelling since his diabetes medicines were changed back in July.  She notes he does take Lantus at home, thinks that the Lantus was in his blister pack yesterday due to the fact that he has been urinating more after taking that.  She notes that Victoza was recently started, and she has some questions about why he was started on this rather than restarting his Lantus.  She thinks that he may have had an episode of bradycardia at his last admission in March, where she reports that his metoprolol was stopped for this.  Patient explicitly denies  Chest pain, lightheadedness, nausea, pain, dysuria. He says he feels "fine."   Review Of Systems: Per HPI with the following additions: as above  ROS  Patient Active Problem List   Diagnosis Date Noted  . Syncope 03/20/2018  . Hypoglycemia   . Melena   . GI bleed 08/28/2017  . Bilateral deafness   . Uses visual frame sign language interpreter   . Right thigh pain 07/24/2017  . Neoplasm, uncertain whether benign or malignant 04/15/2017  . Osteoarthritis of left shoulder 04/07/2017  . Health care maintenance 10/23/2016  . Chronic anticoagulation 10/23/2016  . Elevated LFTs 03/18/2016  . Left shoulder pain 09/28/2015  . Hyperkalemia 11/11/2012  . Allergic rhinitis 09/08/2012  . Primary open-angle glaucoma 08/05/2012  . Hypothyroid 06/17/2012  . Bilateral leg edema 05/30/2012  . BARRETTS ESOPHAGUS 07/11/2010  . Mixed hyperlipidemia 06/20/2010  . Portal vein  thrombosis 06/08/2010  . Gout 06/07/2010  . LIVER MASS 03/08/2010  . LUNG NODULE 03/08/2010  . Type II diabetes mellitus, controlled but with peripheral neuropathy 02/01/2010  . Anemia in chronic kidney disease 02/01/2010  . Isolated systolic HTN, goal SBP <626.  02/01/2010  . Chronic kidney disease (CKD), stage III (moderate) ([place]) 02/01/2010    Past Medical History: Past Medical History:  Diagnosis Date  . Anemia   . Arthritis    "joints" (08/27/2017)  . ARTHRITIS, LEFT SHOULDER 02/01/2010   Qualifier: Diagnosis of  By: Jacques Earthly    . Chronic kidney disease (CKD), stage IV (severe) ([place])   . Deaf   . Diverticulosis   . DIVERTICULOSIS OF COLON 03/08/2010   Qualifier: Diagnosis of  By: Jacques Earthly    . Esophagitis    HSV  . Gout   . History of DVT (deep vein thrombosis)   . Hyperlipidemia   . Hypertension   . Hypothyroidism   . Kidney mass    lipoma  . Liver nodule    u/s 310  . Portal vein thrombosis    on life long coumadin  . PUD, HX OF 03/08/2010   Qualifier:  History of  By: Jacques Earthly    . Type II diabetes mellitus ([place])     Past Surgical History: Past Surgical History:  Procedure Laterality Date  . COLON SURGERY     "something was twisted; that messed w/my BM; had to have it untwisted"  . HERNIA REPAIR    . LIVER LOBECTOMY    . SHOULDER ARTHROCENTESIS Left     Social History: Social History   Tobacco Use  . Smoking status: Former Smoker    Types: Cigarettes    Last attempt to quit: 01/18/2011    Years since quitting: 7.6  . Smokeless tobacco: Never Used  . Tobacco comment: 08/27/2017 "didn't smoke alot; can't tell you how long I smoked"  Substance Use Topics  . Alcohol use: No  . Drug use: No   Additional social history: lives w daughter and son  Please also refer to relevant sections of EMR.  Family History: Family History  Problem Relation Age of Onset  . Cancer Father        prostate  . Heart disease Mother      Allergies and Medications: No Known Allergies No current facility-administered medications on file prior to encounter.    Current Outpatient Medications on File Prior to Encounter  Medication Sig Dispense Refill  . acetaminophen (TYLENOL) 500 MG tablet Take 500 mg by mouth every 6 (six) hours as needed (pain).    Marland Kitchen allopurinol (ZYLOPRIM) 100 MG tablet take 1 tablet (100 mg) by mouth once daily 90 tablet 1  . brimonidine-timolol (COMBIGAN) 0.2-0.5 % ophthalmic solution Place 1 drop into both eyes every 12 (twelve) hours.    . calcitRIOL (ROCALTROL) 0.25 MCG capsule Take 1 capsule (0.25 mcg total) by mouth every Monday, Wednesday, and Friday. 30 capsule 2  . calcium-vitamin D (OSCAL-500) 500-400 MG-UNIT tablet Take 1 tablet by mouth 2 (two) times daily. 180 tablet 0  . docusate sodium (COLACE) 100 MG capsule Take 1 capsule (100 mg total) by mouth 2 (two) times daily as needed (constipation). Over the counter 60 capsule 0  . epoetin alfa (PROCRIT) 16109 UNIT/ML injection Inject 1 mL (10,000 Units total) into the skin 3 (three) times a week. (Patient taking differently: Inject 10,000 Units into the skin every 14 (fourteen) days. ) 1 mL   . ferrous sulfate (FERRO-BOB) 325 (65 FE) MG tablet Take 1 tablet (325 mg total) by mouth daily. Over the counter 30 tablet 0  . furosemide (LASIX) 40 MG tablet TAKE 1 TABLET BY MOUTH EVERY DAY 45 tablet 1  . hydrALAZINE (APRESOLINE) 50 MG tablet take 1 tablet (76m) by mouth three times a day 270 tablet 3  . latanoprost (XALATAN) 0.005 % ophthalmic solution Place 1 drop into both eyes at bedtime.    .Marland Kitchenlevothyroxine (SYNTHROID, LEVOTHROID) 75 MCG tablet Take 1 tablet (75 mcg total) by mouth daily. 90 tablet 0  . liraglutide (VICTOZA) 18 MG/3ML SOPN Inject 0.1 mLs (0.6 mg total) into the skin daily. 1 pen 0  . Multiple Vitamin (GNP ESSENTIAL ONE DAILY) TABS TAKE 1 TABLET BY MOUTH EVERY DAY 30 tablet 5  . pantoprazole (PROTONIX) 20 MG tablet Take 1 tablet (20 mg  total) by mouth 2 (two) times daily. (Patient taking differently: Take 40 mg by mouth 2 (two) times daily. ) 60 tablet 0  . polyethylene glycol powder (GLYCOLAX/MIRALAX) powder Take 17 g by mouth 2 (two) times daily. Hold for loose stools (Patient taking differently: Take 17 g by mouth 2 (two) times  daily as needed for mild constipation. Hold for loose stools) 527 g 6  . pravastatin (PRAVACHOL) 40 MG tablet take 1 tablet (40 mg) by mouth daily at bedtime 90 tablet 1  . sodium bicarbonate 650 MG tablet Take 1 tablet (650 mg total) by mouth 2 (two) times daily. 180 tablet 3  . vitamin B-12 (CYANOCOBALAMIN) 100 MCG tablet Take 1 tablet (100 mcg total) by mouth daily. 30 tablet 0  . Blood Glucose Monitoring Suppl (ONE TOUCH ULTRA 2) w/Device KIT 1 Device by Does not apply route once. 1 each 0  . diclofenac sodium (VOLTAREN) 1 % GEL Apply 4 g topically 4 (four) times daily. 100 g 1  . feeding supplement (BOOST / RESOURCE BREEZE) LIQD Take 1 Container by mouth 3 (three) times daily between meals. 30 Container 0  . glucose blood (ONE TOUCH ULTRA TEST) test strip Check blood sugar three times daily 100 each 3  . Lancets (ONETOUCH ULTRASOFT) lancets Check sugar up to 6 x daily 200 each 4  . ONETOUCH DELICA LANCETS FINE MISC 1 each by Does not apply route every morning. 100 each 11  . sodium polystyrene (KAYEXALATE) 15 GM/60ML suspension Take 60 mLs (15 g total) by mouth once. 3x weekly (tu/th/sat) to prevent high potassium. (Patient not taking: Reported on 08/25/2018) 500 mL 11  . timolol (TIMOPTIC) 0.5 % ophthalmic solution Place 1 drop into both eyes as directed. 10 mL 12    Objective: BP (!) 183/84   Pulse (!) 58   Resp 12   SpO2 100%  Exam: General: Elderly male, lying in bed in no acute distress. Eyes: Extraocular movements intact, pupils equal round reactive to light and accommodation ENTM: Poor dentition, clear oropharynx Neck: Supple Cardiovascular: Bradycardia, no murmurs,  irregular Respiratory: Clear to auscultation bilaterally easy work of breathing Gastrointestinal: Soft nontender nondistended positive bowel sounds. MSK: No lower extremity edema or calf tenderness Derm: No wounds or rashes on visualized skin Neuro: Cranial nerves grossly intact, patient alert and oriented x3 Psych: Mood appropriate  Labs and Imaging: CBC BMET  Recent Labs  Lab 08/25/18 0934  WBC 4.4  HGB 11.3*  HCT 37.9*  PLT PLATELET CLUMPS NOTED ON SMEAR, COUNT APPEARS DECREASED   Recent Labs  Lab 08/25/18 0934  NA 141  K 6.5*  CL 115*  CO2 20*  BUN 53*  CREATININE 1.91*  GLUCOSE 80  CALCIUM 8.9     No results found.  Sela Hilding, MD 08/25/2018, 11:39 AM PGY-3, [place] Intern pager: [phone], text pages welcome

## 2018-08-25 NOTE — ED Notes (Signed)
Family medicine @ bedside

## 2018-08-25 NOTE — ED Triage Notes (Signed)
Pts PCP called for pt to come in and have a redraw of labs d/t Potassium of 7.5 per office, pt denies pain, pt family reports pt has swollen feet, pt ambulatory, pt deaf per family with pt, appears in NAD

## 2018-08-25 NOTE — Telephone Encounter (Signed)
**  After Hours/ Emergency Line Call*  Received a call to report that Philip Matthews had an abnormal lab value. Potassium 7.5.  Repeated and verified. Verbally informed PCP and will forward.   Everrett Coombe, MD PGY-3 [facility]

## 2018-08-26 DIAGNOSIS — R001 Bradycardia, unspecified: Secondary | ICD-10-CM

## 2018-08-26 LAB — BASIC METABOLIC PANEL
ANION GAP: 6 (ref 5–15)
Anion gap: 6 (ref 5–15)
Anion gap: 5 (ref 5–15)
Anion gap: 5 (ref 5–15)
Anion gap: 7 (ref 5–15)
Anion gap: 4 — ABNORMAL LOW (ref 5–15)
BUN: 44 mg/dL — ABNORMAL HIGH (ref 8–23)
BUN: 44 mg/dL — AB (ref 8–23)
BUN: 42 mg/dL — AB (ref 8–23)
BUN: 42 mg/dL — AB (ref 8–23)
BUN: 42 mg/dL — AB (ref 8–23)
BUN: 43 mg/dL — AB (ref 8–23)
CALCIUM: 8.4 mg/dL — AB (ref 8.9–10.3)
CALCIUM: 8.4 mg/dL — AB (ref 8.9–10.3)
CALCIUM: 8.4 mg/dL — AB (ref 8.9–10.3)
CALCIUM: 8.3 mg/dL — AB (ref 8.9–10.3)
CHLORIDE: 113 mmol/L — AB (ref 98–111)
CHLORIDE: 113 mmol/L — AB (ref 98–111)
CHLORIDE: 111 mmol/L (ref 98–111)
CHLORIDE: 113 mmol/L — AB (ref 98–111)
CHLORIDE: 110 mmol/L (ref 98–111)
CO2: 23 mmol/L (ref 22–32)
CO2: 23 mmol/L (ref 22–32)
CO2: 23 mmol/L (ref 22–32)
CO2: 24 mmol/L (ref 22–32)
CO2: 24 mmol/L (ref 22–32)
CO2: 23 mmol/L (ref 22–32)
CREATININE: 1.56 mg/dL — AB (ref 0.61–1.24)
CREATININE: 1.62 mg/dL — AB (ref 0.61–1.24)
CREATININE: 1.59 mg/dL — AB (ref 0.61–1.24)
CREATININE: 1.6 mg/dL — AB (ref 0.61–1.24)
CREATININE: 1.62 mg/dL — AB (ref 0.61–1.24)
Calcium: 8.6 mg/dL — ABNORMAL LOW (ref 8.9–10.3)
Calcium: 8.5 mg/dL — ABNORMAL LOW (ref 8.9–10.3)
Chloride: 115 mmol/L — ABNORMAL HIGH (ref 98–111)
Creatinine, Ser: 1.88 mg/dL — ABNORMAL HIGH (ref 0.61–1.24)
GFR calc Af Amer: 43 mL/min — ABNORMAL LOW (ref >60)
GFR calc Af Amer: 42 mL/min — ABNORMAL LOW (ref >60)
GFR calc Af Amer: 35 mL/min — ABNORMAL LOW (ref >60)
GFR calc non Af Amer: 38 mL/min — ABNORMAL LOW (ref >60)
GFR calc non Af Amer: 37 mL/min — ABNORMAL LOW (ref >60)
GFR calc non Af Amer: 37 mL/min — ABNORMAL LOW (ref >60)
GFR calc non Af Amer: 37 mL/min — ABNORMAL LOW (ref >60)
GFR calc non Af Amer: 37 mL/min — ABNORMAL LOW (ref >60)
GFR, EST AFRICAN AMERICAN: 44 mL/min — AB (ref >60)
GFR, EST AFRICAN AMERICAN: 42 mL/min — AB (ref >60)
GFR, EST AFRICAN AMERICAN: 43 mL/min — AB (ref >60)
GFR, EST NON AFRICAN AMERICAN: 31 mL/min — AB (ref >60)
GLUCOSE: 94 mg/dL (ref 70–99)
GLUCOSE: 212 mg/dL — AB (ref 70–99)
GLUCOSE: 170 mg/dL — AB (ref 70–99)
Glucose, Bld: 69 mg/dL — ABNORMAL LOW (ref 70–99)
Glucose, Bld: 125 mg/dL — ABNORMAL HIGH (ref 70–99)
Glucose, Bld: 103 mg/dL — ABNORMAL HIGH (ref 70–99)
POTASSIUM: 5.9 mmol/L — AB (ref 3.5–5.1)
Potassium: 5.7 mmol/L — ABNORMAL HIGH (ref 3.5–5.1)
Potassium: 5.8 mmol/L — ABNORMAL HIGH (ref 3.5–5.1)
Potassium: 5.7 mmol/L — ABNORMAL HIGH (ref 3.5–5.1)
Potassium: 5.6 mmol/L — ABNORMAL HIGH (ref 3.5–5.1)
Potassium: 5.5 mmol/L — ABNORMAL HIGH (ref 3.5–5.1)
SODIUM: 141 mmol/L (ref 135–145)
Sodium: 144 mmol/L (ref 135–145)
Sodium: 141 mmol/L (ref 135–145)
Sodium: 142 mmol/L (ref 135–145)
Sodium: 141 mmol/L (ref 135–145)
Sodium: 139 mmol/L (ref 135–145)

## 2018-08-26 LAB — CBC
HEMATOCRIT: 32.8 % — AB (ref 39.0–52.0)
Hemoglobin: 10 g/dL — ABNORMAL LOW (ref 13.0–17.0)
MCH: 27.5 pg (ref 26.0–34.0)
MCHC: 30.5 g/dL (ref 30.0–36.0)
MCV: 90.1 fL (ref 78.0–100.0)
Platelets: 141 10*3/uL — ABNORMAL LOW (ref 150–400)
RBC: 3.64 MIL/uL — ABNORMAL LOW (ref 4.22–5.81)
RDW: 19.2 % — AB (ref 11.5–15.5)
WBC: 4.5 10*3/uL (ref 4.0–10.5)

## 2018-08-26 LAB — GLUCOSE, CAPILLARY
Glucose-Capillary: 81 mg/dL (ref 70–99)
Glucose-Capillary: 237 mg/dL — ABNORMAL HIGH (ref 70–99)
Glucose-Capillary: 111 mg/dL — ABNORMAL HIGH (ref 70–99)
Glucose-Capillary: 114 mg/dL — ABNORMAL HIGH (ref 70–99)

## 2018-08-26 LAB — TSH: TSH: 4.047 u[IU]/mL (ref 0.350–4.500)

## 2018-08-26 MED ORDER — FUROSEMIDE 40 MG PO TABS
40.0000 mg | ORAL_TABLET | Freq: Every day | ORAL | Status: AC
  Administered 2018-08-27 – 2018-08-28 (×2): 40 mg via ORAL
  Filled 2018-08-26 (×2): qty 1

## 2018-08-26 MED ORDER — PATIROMER SORBITEX CALCIUM 8.4 G PO PACK
8.4000 g | PACK | Freq: Every day | ORAL | Status: DC
  Administered 2018-08-26 – 2018-08-27 (×2): 8.4 g via ORAL
  Filled 2018-08-26 (×3): qty 1

## 2018-08-26 NOTE — Consult Note (Signed)
   Brief note.  Called for consultation for bradycardia in a patient admitted with hyperkalemia.  He had about 20 minutes yesterday of bradycardia ranging in the 20s and 30s.  This was in the setting of 2-1 block while being treated for hyperkalemia.  He has had intermittent episodes of 2-1 block since with better rates and currently is in sinus rhythm with first-degree AV block and no further significant AV block or bradycardia.  I would anticipate that his bradycardia will improve while his potassium improves.  No further evaluation is needed at this time.  No indication for pacemaker.  Continue to follow on telemetry and notify us if there are any further problems.  With prolonged PR interval, would avoid AV nodal agents such as beta blockers or non-dehydrating calcium channel blockers as well as digoxin.   Glenetta Hew, MD

## 2018-08-26 NOTE — Progress Notes (Signed)
Family Medicine Teaching Service Daily Progress Note Intern Pager: [phone]  Patient name: Philip Matthews Medical record number: [id number] Date of birth: July 23, 1928 Age: 84 y.o. Gender: male  Primary Care Provider: Rory Percy, DO Consultants: none Code Status: full  Pt Overview and Major Events to Date:  Admit 8/27  Assessment and Plan: Hyperkalemia 2/2 RTA, non adherent to med: home med: kayexalate, daughter says he hasn't taken in 6 months. K 6.5 on admit with peaked T waves with bradycardia, 7.5 in clinic day prior to admission. Repeat EKG this morning had resolved t wave peaking. ED given insulin, D50, calcium, bicarb, kayexalate. Potassium 5.5 today. Consulted nephrology and cardiology - initiated patiromer 8.4g daily x 1 week (can increase dose 25.2mg /daily max) -q6 hr BMPs -consider additional insulin with D50 if not reducing -restarted home Lasix as Cr is at baseline and will help improve K+ level  Bradycardia: profoundly bradycardic to 20s intermittently yesterday. Patient mentating at reported baseline per daughter during these episodes.  No signs of hypotension.  Has been given treatment for his hyperkalemia, will closely monitor bradycardia for resolution with hyperkalemia.TSH is normal. HR is 55-82 today -consulted cards- they anticipate bradycardia will resolve with potassium treatment and does not recommend further evaluation at this time -avoid AV nodal agents such as BB or non-dehydrating CCC, digoxin due to 1st degree AV block shown on EKG  Anemia:Hgb 10.0 today- at baseline. Receives procrit with nephro. Hx of colonoscopy in 2011withdiverticulosis of colon  -Continue to follow  - Continue iron supplement   CKDstage 3: Cr today 1.62 GFR 42. Home lasix is 40mg  PO QD.  - BMPs as above - Continue to follow  T2DM: Hgb A1C 8.9 07/21/18. Home regimen includesvictoza.  - Sensitive sliding scale  - hold victoza  Hypertension:bradycardia resolved. HR 65,  BP 127/66 today. Hydralazine 50mg  TID, Lasix 40mg  daily - continue home meds  Hypothyroidism TSH this admission is normal - Continue home dose Synthroid   HLD  -continue statin  Primary open angle glaucoma -continue home drops  Gout on allopurinol at home.  - Continue Allopurinol  Memory decline - daughter reports he is confused at times since last admission. May benefit from dementia eval.  -consider outpatient geri appt vs MMSE  Care coordination  -pharmacy review -PT/OT eval -consider additional [place] services   FEN/GI: heart healthy diet Prophylaxis: lovenox  Disposition: consider transferring from stepdown   Subjective:  Overnight: no acute events Today: used ASL translater #[id number]. Patient is feeling well today. Not in any pain and really wants to go home. No SOB. He admits to having one episode of diarrhea yesterday.  Objective: Temp:  [97.3 F (36.3 C)-97.6 F (36.4 C)] 97.5 F (36.4 C) (08/28 0529) Pulse Rate:  [27-83] 69 (08/28 0046) Resp:  [9-17] 12 (08/28 0046) BP: (107-189)/(56-162) 131/76 (08/28 0529) SpO2:  [99 %-100 %] 100 % (08/28 0046) Weight:  [51.8 kg] 51.8 kg (08/27 1348) Physical Exam: General: NAD Cardiovascular: RRR Respiratory: CTAB Abdomen: soft, non-tender. I did feel a quarter sized superficial hardened mass underneath one of the lead patches. Extremities: No edema, well-perfused. Hands have chronic deformities at DIP perhaps due to osteoarthritis but no history  Laboratory: Recent Labs  Lab 08/25/18 0934 08/26/18 0153  WBC 4.4 4.5  HGB 11.3* 10.0*  HCT 37.9* 32.8*  PLT PLATELET CLUMPS NOTED ON SMEAR, COUNT APPEARS DECREASED 141*   Recent Labs  Lab 08/25/18 0934  08/25/18 2236 08/26/18 0153 08/26/18 0438  NA 141   < >  142 144 141  K 6.5*   < > 4.9 5.7* 5.8*  CL 115*   < > 114* 115* 113*  CO2 20*   < > 22 23 23   BUN 53*   < > 43* 44* 44*  CREATININE 1.91*   < > 1.60* 1.56* 1.62*  CALCIUM 8.9   < > 8.4* 8.6* 8.4*    PROT 7.0  --   --   --   --   BILITOT 0.5  --   --   --   --   ALKPHOS 67  --   --   --   --   ALT 39  --   --   --   --   AST 54*  --   --   --   --   GLUCOSE 80   < > 408* 69* 125*   < > = values in this interval not displayed.    Imaging/Diagnostic Tests: ED EKG   Today's EKG     Richarda Osmond, DO 08/26/2018, 6:55 AM PGY-1, [place] Intern pager: [phone], text pages welcome

## 2018-08-26 NOTE — Discharge Summary (Signed)
[facility] Discharge Summary  Patient name: BASSAM DRESCH Medical record number: [id number] Date of birth: 04-28-1928 Age: 84 y.o. Gender: male Date of Admission: 08/25/2018  Date of Discharge: 08/31/18 Admitting Physician: Alveda Reasons, MD  Primary Care Provider: Rory Percy, DO Consultants: cardiology  Indication for Hospitalization: hyperkalemia 2/2 RTA  Discharge Diagnoses/Problem List:  Hyperkalemia 2/2 RTA  Disposition: home with daughter, home health OT and PT   Discharge Condition: stable  Discharge Exam: Physical Exam:  Gen: NAD, alert, non-toxic, well-nourished, well-appearing, sitting comfortably  HEENT: Normocephaic, atraumatic. Clear conjuctiva, no scleral icterus and injection.  CV: Regular rate and rhythm.  Normal S1-S2.   Normal capillary refill bilaterally.  Radial pulses 2+ bilaterally. No bilateral lower extremity edema. Resp: Clear to auscultation bilaterally.  No wheezing, rales, abnormal lung sounds.  No increased work of breathing appreciated. Abd: Nontender and nondistended on palpation to all 4 quadrants.  Positive bowel sounds. Psych: Cooperative with exam. Pleasant. Makes eye contact.  Brief Hospital Course:  Patient treated for Hyperkalemia with peaked T waves on EKG. Hyperkalemia was thought to be due to noncompliance with Kayexalate and recently running out of diuretics at home. Patient had associated bradycardia to 20's which improved with treatment of hyperkalemia. Cardiology and nephrology were consulted. Concerning his bradycardia, cardiology suspected it was due to 2-1 block in the setting of hyperkalemia. His bradycardia improved and he was consistently in the 60's-70's prior to discharge.  Nephrology recommended discontinuing lovenox in mineralocorticoid axis as it might have been increasing his hyperkalemia. He was also switched over from patriromer to lokelma BID and then to single daily dose of 5gm daily. He was  approved for discharge by nephrology with directions for follow up labs next week.  His potassium steadily improved with admission and was 5.3 on the day of discharge.   His other chronic conditions were monitored. Lasix for HTN and Victoza for DM2 were held during admission. Both were continued at discharge. Anemia, CKD stage 3, DM2, HTN, Hypothyroidism, HLD, OA glaucoma, and gout were stable through admission.  Issues for Follow Up:  1. Patient will need follow up with PCP/pharmacology for improved management of home meds 2. Patient will likely need assistance for access to outpatient patiromer for treatment of hyperkalemia due to drug expense and availability  3. Follow up for heart rate which was bradycardic down to 20's transiently during admission likely 2/2 hyperkalemia 4. Close follow up of potassium levels outpatient 5. Follow up with nephrology for management of RTA 6. Follow up with constipation. Discharged with Mirilax, and can go back to PRN after BM.  7. Home Health PT for supervision for mobility/OOB 8. Consider outpatient Geriatric appt as daughter reports increased confusion since last admission.  Significant Procedures: none  Significant Labs and Imaging:  Recent Labs  Lab 08/26/18 0153 08/29/18 1125 08/31/18 0416  WBC 4.5 5.1 5.6  HGB 10.0* 10.8* 10.9*  HCT 32.8* 35.4* 35.2*  PLT 141* 145* 110*   Recent Labs  Lab 08/25/18 0934  08/28/18 1820  08/29/18 1824 08/30/18 0047 08/30/18 0807 08/30/18 1308 08/31/18 0416  NA 141   < > 140   < > 131* 134* 135 134* 137  K 6.5*   < > 5.9*   < > 5.1 5.5* 5.2* 4.8 5.3*  CL 115*   < > 108   < > 101 102 104 103 104  CO2 20*   < > 23   < > 21* 25 24 23  26  GLUCOSE 80   < > 53*   < > 188* 159* 146* 243* 155*  BUN 53*   < > 64*   < > 69* 70* 66* 69* 73*  CREATININE 1.91*   < > 1.88*   < > 2.16* 2.12* 2.02* 2.21* 2.08*  CALCIUM 8.9   < > 9.3   < > 8.7* 9.0 8.8* 8.6* 8.6*  MG  --   --  1.5*  --   --   --   --   --   --    ALKPHOS 67  --   --   --   --   --   --   --   --   AST 54*  --   --   --   --   --   --   --   --   ALT 39  --   --   --   --   --   --   --   --   ALBUMIN 3.5  --   --   --   --   --   --   --   --    < > = values in this interval not displayed.    Results/Tests Pending at Time of Discharge: none  Discharge Medications:  Allergies as of 08/31/2018   No Known Allergies     Medication List    STOP taking these medications   diclofenac sodium 1 % Gel Commonly known as:  VOLTAREN   sodium polystyrene 15 GM/60ML suspension Commonly known as:  KAYEXALATE   timolol 0.5 % ophthalmic solution Commonly known as:  TIMOPTIC     TAKE these medications   acetaminophen 500 MG tablet Commonly known as:  TYLENOL Take 500 mg by mouth every 6 (six) hours as needed (pain).   allopurinol 100 MG tablet Commonly known as:  ZYLOPRIM take 1 tablet (100 mg) by mouth once daily   calcitRIOL 0.25 MCG capsule Commonly known as:  ROCALTROL Take 1 capsule (0.25 mcg total) by mouth every Monday, Wednesday, and Friday.   calcium-vitamin D 500-400 MG-UNIT tablet Commonly known as:  OSCAL-500 Take 1 tablet by mouth 2 (two) times daily.   COMBIGAN 0.2-0.5 % ophthalmic solution Generic drug:  brimonidine-timolol Place 1 drop into both eyes every 12 (twelve) hours.   docusate sodium 100 MG capsule Commonly known as:  COLACE Take 1 capsule (100 mg total) by mouth 2 (two) times daily as needed (constipation). Over the counter   epoetin alfa 10000 UNIT/ML injection Commonly known as:  EPOGEN,PROCRIT Inject 1 mL (10,000 Units total) into the skin 3 (three) times a week. What changed:  when to take this   feeding supplement Liqd Take 1 Container by mouth 3 (three) times daily between meals.   ferrous sulfate 325 (65 FE) MG tablet Take 1 tablet (325 mg total) by mouth daily. Over the counter   furosemide 40 MG tablet Commonly known as:  LASIX TAKE 1 TABLET BY MOUTH EVERY DAY   glucose blood  test strip Check blood sugar three times daily   GNP ESSENTIAL ONE DAILY Tabs TAKE 1 TABLET BY MOUTH EVERY DAY   hydrALAZINE 50 MG tablet Commonly known as:  APRESOLINE take 1 tablet (36m) by mouth three times a day   latanoprost 0.005 % ophthalmic solution Commonly known as:  XALATAN Place 1 drop into both eyes at bedtime.   levothyroxine 75 MCG tablet Commonly known as:  SYNTHROID, LEVOTHROID Take 1 tablet (75 mcg total) by mouth daily.   liraglutide 18 MG/3ML Sopn Commonly known as:  VICTOZA Inject 0.1 mLs (0.6 mg total) into the skin daily.   ONE TOUCH ULTRA 2 w/Device Kit 1 Device by Does not apply route once.   ONETOUCH DELICA LANCETS FINE Misc 1 each by Does not apply route every morning.   onetouch ultrasoft lancets Check sugar up to 6 x daily   pantoprazole 20 MG tablet Commonly known as:  PROTONIX Take 1 tablet (20 mg total) by mouth 2 (two) times daily. What changed:  how much to take   polyethylene glycol powder powder Commonly known as:  GLYCOLAX/MIRALAX Take 17 g by mouth 2 (two) times daily. Hold for loose stools What changed:    when to take this  reasons to take this   pravastatin 40 MG tablet Commonly known as:  PRAVACHOL take 1 tablet (40 mg) by mouth daily at bedtime   sodium bicarbonate 650 MG tablet Take 1 tablet (650 mg total) by mouth 2 (two) times daily.   sodium zirconium cyclosilicate 5 g packet Commonly known as:  LOKELMA Take 5 g by mouth daily. Start taking on:  09/01/2018   vitamin B-12 100 MCG tablet Commonly known as:  CYANOCOBALAMIN Take 1 tablet (100 mcg total) by mouth daily.            Durable Medical Equipment  (From admission, onward)         Start     Ordered   08/29/18 1809  For home use only DME Bedside commode  Once    Question:  Patient needs a bedside commode to treat with the following condition  Answer:  Limited mobility   08/29/18 1808          Discharge Instructions: Please refer to Patient  Instructions section of EMR for full details.  Patient was counseled important signs and symptoms that should prompt return to medical care, changes in medications, dietary instructions, activity restrictions, and follow up appointments.   Follow-Up Appointments: Follow-up Information    Lovenia Cree Napoli, MD. Go on 09/02/2018.   Specialty:  Family Medicine Why:  Please arrive by 2:35pm. Appointment time at 2:50pm. Contact information: [place] [address] [phone]           Wilber Oliphant, MD 08/31/2018, 3:01 PM PGY-1, [place]

## 2018-08-26 NOTE — Evaluation (Signed)
Physical Therapy Evaluation Patient Details Name: Philip Matthews MRN: [id number] DOB: 09/26/1928 Today's Date: 08/26/2018   History of Present Illness  84 year old male with known distal renal tubular acidosis and hyperkalemia, osteoarthritis, hypothyroidism, type 2 diabetes, congenital deafness who presents with acute hyperkalemia.     Clinical Impression  Pt admitted with above diagnosis. Pt currently with functional limitations due to the deficits listed below (see PT Problem List). Video sign interpreter used for history. PTA, pt living with daughter with 24/7 supervision, mod I with ADLs and household ambulation with RW. Upon eval pt ambulating 120', HR 60-79 this session, SpO2 WNL with activity on RA. Pt supervision for all mobility and will need another PT visit to do stairs before returning home.  Pt will benefit from skilled PT to increase their independence and safety with mobility to allow discharge to the venue listed below.       Follow Up Recommendations Home health PT;Supervision for mobility/OOB    Equipment Recommendations  None recommended by PT    Recommendations for Other Services       Precautions / Restrictions Precautions Precautions: Fall Precaution Comments: sign language Restrictions Weight Bearing Restrictions: No      Mobility  Bed Mobility Overal bed mobility: Independent                Transfers Overall transfer level: Needs assistance Equipment used: Rolling walker (2 wheeled) Transfers: Sit to/from Stand Sit to Stand: Min guard;Supervision         General transfer comment: cues for hand placement off bed onto walker as first time he attempted to pull up from RW and tipped it back. Pt with cary over in session and able to stand without physical assistance  Ambulation/Gait Ambulation/Gait assistance: Min guard;Supervision Gait Distance (Feet): 120 Feet Assistive device: Rolling walker (2 wheeled) Gait Pattern/deviations: Step-to  pattern Gait velocity: decreased   General Gait Details: pt with slow and cautious gait, safe with RW. HR max 78, SpO2 WNL on RA  Stairs            Wheelchair Mobility    Modified Rankin (Stroke Patients Only)       Balance                                             Pertinent Vitals/Pain Pain Assessment: No/denies pain    Home Living Family/patient expects to be discharged to:: Private residence Living Arrangements: Children Available Help at Discharge: Family;Available 24 hours/day Type of Home: House Home Access: Stairs to enter Entrance Stairs-Rails: Can reach both Entrance Stairs-Number of Steps: 3 Home Layout: One level Home Equipment: Walker - 2 wheels;Shower seat;Cane - quad      Prior Function Level of Independence: Independent with assistive device(s)         Comments: ambulating in home with RW. no falls in past 6 months.      Hand Dominance   Dominant Hand: Right    Extremity/Trunk Assessment   Upper Extremity Assessment Upper Extremity Assessment: Defer to OT evaluation    Lower Extremity Assessment Lower Extremity Assessment: Overall WFL for tasks assessed       Communication   Communication: Interpreter utilized;Deaf  Cognition Arousal/Alertness: Awake/alert Behavior During Therapy: WFL for tasks assessed/performed  General Comments      Exercises     Assessment/Plan    PT Assessment Patient needs continued PT services  PT Problem List Decreased activity tolerance;Decreased balance;Cardiopulmonary status limiting activity       PT Treatment Interventions DME instruction;Gait training;Functional mobility training;Stair training;Therapeutic activities;Therapeutic exercise    PT Goals (Current goals can be found in the Care Plan section)  Acute Rehab PT Goals Patient Stated Goal: return home when ready PT Goal Formulation: With patient Time  For Goal Achievement: 09/02/18 Potential to Achieve Goals: Good    Frequency Min 3X/week   Barriers to discharge        Co-evaluation               AM-PAC PT "6 Clicks" Daily Activity  Outcome Measure Difficulty turning over in bed (including adjusting bedclothes, sheets and blankets)?: None Difficulty moving from lying on back to sitting on the side of the bed? : None Difficulty sitting down on and standing up from a chair with arms (e.g., wheelchair, bedside commode, etc,.)?: None Help needed moving to and from a bed to chair (including a wheelchair)?: A Little Help needed walking in hospital room?: A Little Help needed climbing 3-5 steps with a railing? : A Little 6 Click Score: 21    End of Session Equipment Utilized During Treatment: Gait belt Activity Tolerance: Patient tolerated treatment well Patient left: in chair;with call bell/phone within reach Nurse Communication: Mobility status PT Visit Diagnosis: Unsteadiness on feet (R26.81);Muscle weakness (generalized) (M62.81)    Time: 1962-2297 PT Time Calculation (min) (ACUTE ONLY): 35 min   Charges:   PT Evaluation $PT Eval Moderate Complexity: 1 Mod PT Treatments $Gait Training: 8-22 mins       Reinaldo Berber, PT, DPT Acute Rehab Services Pager: [phone]    Reinaldo Berber 08/26/2018, 5:01 PM

## 2018-08-26 NOTE — Progress Notes (Signed)
OT Cancellation Note  Patient Details Name: Philip Matthews MRN: [id number] DOB: 12/05/1928   Cancelled Treatment:    Reason Eval/Treat Not Completed: Patient not medically ready;Medical issues which prohibited therapy. Pt continues with potassium value >5 (5.7 most recent). Will hold at this time, follow, and check back as appropriate.   Norman Herrlich, MS OTR/L  Pager: [phone]   Norman Herrlich 08/26/2018, 8:31 AM

## 2018-08-27 LAB — BASIC METABOLIC PANEL
ANION GAP: 6 (ref 5–15)
Anion gap: 5 (ref 5–15)
Anion gap: 5 (ref 5–15)
BUN: 41 mg/dL — AB (ref 8–23)
BUN: 38 mg/dL — AB (ref 8–23)
BUN: 47 mg/dL — ABNORMAL HIGH (ref 8–23)
CALCIUM: 8.9 mg/dL (ref 8.9–10.3)
CHLORIDE: 110 mmol/L (ref 98–111)
CO2: 24 mmol/L (ref 22–32)
CO2: 25 mmol/L (ref 22–32)
CO2: 27 mmol/L (ref 22–32)
CREATININE: 1.65 mg/dL — AB (ref 0.61–1.24)
CREATININE: 1.67 mg/dL — AB (ref 0.61–1.24)
Calcium: 8.4 mg/dL — ABNORMAL LOW (ref 8.9–10.3)
Calcium: 8.7 mg/dL — ABNORMAL LOW (ref 8.9–10.3)
Chloride: 111 mmol/L (ref 98–111)
Chloride: 107 mmol/L (ref 98–111)
Creatinine, Ser: 1.91 mg/dL — ABNORMAL HIGH (ref 0.61–1.24)
GFR calc Af Amer: 41 mL/min — ABNORMAL LOW (ref >60)
GFR calc Af Amer: 41 mL/min — ABNORMAL LOW (ref >60)
GFR calc non Af Amer: 36 mL/min — ABNORMAL LOW (ref >60)
GFR calc non Af Amer: 35 mL/min — ABNORMAL LOW (ref >60)
GFR, EST AFRICAN AMERICAN: 35 mL/min — AB (ref >60)
GFR, EST NON AFRICAN AMERICAN: 30 mL/min — AB (ref >60)
GLUCOSE: 130 mg/dL — AB (ref 70–99)
Glucose, Bld: 112 mg/dL — ABNORMAL HIGH (ref 70–99)
Glucose, Bld: 133 mg/dL — ABNORMAL HIGH (ref 70–99)
POTASSIUM: 5.2 mmol/L — AB (ref 3.5–5.1)
POTASSIUM: 5.2 mmol/L — AB (ref 3.5–5.1)
Potassium: 5.4 mmol/L — ABNORMAL HIGH (ref 3.5–5.1)
SODIUM: 140 mmol/L (ref 135–145)
SODIUM: 140 mmol/L (ref 135–145)
Sodium: 140 mmol/L (ref 135–145)

## 2018-08-27 LAB — GLUCOSE, CAPILLARY
GLUCOSE-CAPILLARY: 112 mg/dL — AB (ref 70–99)
GLUCOSE-CAPILLARY: 123 mg/dL — AB (ref 70–99)
GLUCOSE-CAPILLARY: 120 mg/dL — AB (ref 70–99)
Glucose-Capillary: 144 mg/dL — ABNORMAL HIGH (ref 70–99)

## 2018-08-27 MED ORDER — GLYCERIN (LAXATIVE) 2.1 G RE SUPP
1.0000 | Freq: Once | RECTAL | Status: AC
  Administered 2018-08-27: 1 via RECTAL
  Filled 2018-08-27: qty 1

## 2018-08-27 MED ORDER — POLYETHYLENE GLYCOL 3350 17 G PO PACK
17.0000 g | PACK | Freq: Two times a day (BID) | ORAL | Status: DC
  Administered 2018-08-27 – 2018-08-31 (×6): 17 g via ORAL
  Filled 2018-08-27 (×10): qty 1

## 2018-08-27 MED ORDER — POLYETHYLENE GLYCOL 3350 17 G PO PACK: 17.0000 g | PACK | Freq: Every day | ORAL | Status: DC | PRN

## 2018-08-27 NOTE — Progress Notes (Signed)
Family Medicine Teaching Service Daily Progress Note Intern Pager: [phone]  Patient name: Philip Matthews Medical record number: [id number] Date of birth: 11/26/1928 Age: 84 y.o. Gender: male  Primary Care Provider: Rory Percy, DO Consultants: none Code Status: full  Pt Overview and Major Events to Date:  Admit 8/27  Assessment and Plan: Hyperkalemia 2/2 RTA, non adherent to med: K+ today is 5.4 - continue patiromer 8.4g daily x 1 week (can increase dose 25.2mg /daily max) -q6 hr BMPs -consider additional insulin with D50 if not reducing -continue Lasix  Bradycardia:resolved with treatment for hyperkalemia. HR is 60-68 today -consult cards if bradycardia resumes after hyperkalemia resolved -avoid AV nodal agents such as BB or non-dehydrating CCC, digoxin due to 1st degree AV block shown on EKG  Anemia:Hgb 10.0 yesterday- at baseline. Receives procrit with nephro. Hx of colonoscopy in 2011withdiverticulosis of colon  -Continue to follow  - Continue iron supplement  CKDstage 3: Cr today 1.91 GFR 35. Home lasix is 40mg  PO QD.  - BMPs - continue home Lasix, hold for increased creatinine tomorrow  -consider adding fluids  T2DM: Hgb A1C 8.9 07/21/18. Home regimen includesvictoza.  - Sensitive sliding scale  - hold victoza  Hypertension:home: Hydralazine 50mg  TID, Lasix 40mg  daily - continue home meds -continue to monitor vitals  Hypothyroidism TSH this admission is normal - Continue home dose Synthroid   HLD  -continue statin  Primary open angle glaucoma -continue home drops  Gout on allopurinol at home.  - Continue Allopurinol  Memory decline - daughter reports he is confused at times since last admission. May benefit from dementia eval.  -consider outpatient geri appt vs MMSE  Care coordination  -pharmacy review -PT/OT eval -consider additional [place] services -lives at home with his daughter   FEN/GI: heart healthy diet Prophylaxis:  lovenox  Disposition: transfer to med-surg  Subjective:  Overnight: no acute events Today: used ASL translater #[id number] to speak to patient in morning- He felt fine and complained of constipation- I added miralax BID This afternoon spoke to patient and family members using ASL translator [phone]. Updated family on management and patient stated that he still had not had BM. I offered a suppository and patient was positive for that option. Ordered.   Objective: Temp:  [97.4 F (36.3 C)-98.2 F (36.8 C)] 97.4 F (36.3 C) (08/29 0610) Pulse Rate:  [60-67] 60 (08/29 0610) Resp:  [15-18] 17 (08/28 1700) BP: (127-158)/(66-113) 155/73 (08/29 0610) SpO2:  [98 %-100 %] 100 % (08/29 0610)   Physical Exam: General: NAD Cardiovascular: RRR, no murmmers appreciated Respiratory: CTAB Abdomen: soft, non-tender, normoactive BS in all 4 quadrants Extremities: No edema, well-perfused. Hands have chronic deformities at DIP perhaps due to osteoarthritis but no history  Laboratory: Recent Labs  Lab 08/25/18 0934 08/26/18 0153  WBC 4.4 4.5  HGB 11.3* 10.0*  HCT 37.9* 32.8*  PLT PLATELET CLUMPS NOTED ON SMEAR, COUNT APPEARS DECREASED 141*   Recent Labs  Lab 08/25/18 0934  08/26/18 1229 08/26/18 1816 08/27/18 0249  NA 141   < > 141 139 140  K 6.5*   < > 5.5* 5.9* 5.2*  CL 115*   < > 113* 110 111  CO2 20*   < > 24 23 24   BUN 53*   < > 42* 43* 41*  CREATININE 1.91*   < > 1.62* 1.88* 1.65*  CALCIUM 8.9   < > 8.3* 8.5* 8.4*  PROT 7.0  --   --   --   --  BILITOT 0.5  --   --   --   --   ALKPHOS 67  --   --   --   --   ALT 39  --   --   --   --   AST 54*  --   --   --   --   GLUCOSE 80   < > 212* 170* 130*   < > = values in this interval not displayed.    Imaging/Diagnostic Tests:  Richarda Osmond, DO 08/27/2018, 6:13 AM PGY-1, [place] Intern pager: [phone], text pages welcome

## 2018-08-27 NOTE — Progress Notes (Signed)
Physical Therapy Treatment Patient Details Name: Philip Matthews MRN: [id number] DOB: 06-28-1928 Today's Date: 08/27/2018    History of Present Illness 84 year old male with known distal renal tubular acidosis and hyperkalemia, osteoarthritis, hypothyroidism, type 2 diabetes, congenital deafness who presents with acute hyperkalemia.    PT Comments    Pt doing well with therapy today, increasing ambulation distance and level of assistance needed. Supervision level with RW, contact guard level without AD. Walked 220' with SpO2 WNL on RA, HR resting 80, max 105 with 1 missed beat noted. Daughter and husband present, wish him to return home with their support and are agreeable to HHPT.     Follow Up Recommendations  Home health PT;Supervision for mobility/OOB     Equipment Recommendations  None recommended by PT    Recommendations for Other Services       Precautions / Restrictions Precautions Precautions: Fall Precaution Comments: sign language Restrictions Weight Bearing Restrictions: No    Mobility  Bed Mobility Overal bed mobility: Independent                Transfers Overall transfer level: Needs assistance Equipment used: Rolling walker (2 wheeled) Transfers: Sit to/from Stand Sit to Stand: Min guard;Supervision         General transfer comment: pt required 1 cue for hand placement, then able to demonstrate cary over.   Ambulation/Gait Ambulation/Gait assistance: Min guard;Supervision Gait Distance (Feet): 200 Feet Assistive device: Rolling walker (2 wheeled) Gait Pattern/deviations: Step-to pattern Gait velocity: decreased   General Gait Details: pt ambulating more confidendtly, trialed without RW pt safer with RW at this time but ale to walk with min guard without AD   Stairs             Wheelchair Mobility    Modified Rankin (Stroke Patients Only)       Balance                                             Cognition Arousal/Alertness: Awake/alert Behavior During Therapy: WFL for tasks assessed/performed Overall Cognitive Status: Within Functional Limits for tasks assessed                                        Exercises      General Comments General comments (skin integrity, edema, etc.): Family present discussion with them via interpreter over recomendations they are in agreeemnt HHPT      Pertinent Vitals/Pain Pain Assessment: No/denies pain    Home Living                      Prior Function            PT Goals (current goals can now be found in the care plan section) Acute Rehab PT Goals Patient Stated Goal: return home when ready PT Goal Formulation: With patient Time For Goal Achievement: 09/02/18 Potential to Achieve Goals: Good Progress towards PT goals: Progressing toward goals    Frequency    Min 3X/week      PT Plan Current plan remains appropriate    Co-evaluation              AM-PAC PT "6 Clicks" Daily Activity  Outcome Measure  Difficulty turning over in bed (including adjusting bedclothes, sheets  and blankets)?: None Difficulty moving from lying on back to sitting on the side of the bed? : None Difficulty sitting down on and standing up from a chair with arms (e.g., wheelchair, bedside commode, etc,.)?: None Help needed moving to and from a bed to chair (including a wheelchair)?: A Little Help needed walking in hospital room?: A Little Help needed climbing 3-5 steps with a railing? : A Little 6 Click Score: 21    End of Session Equipment Utilized During Treatment: Gait belt Activity Tolerance: Patient tolerated treatment well Patient left: in chair;with call bell/phone within reach Nurse Communication: Mobility status PT Visit Diagnosis: Unsteadiness on feet (R26.81);Muscle weakness (generalized) (M62.81)     Time: 6389-3734 PT Time Calculation (min) (ACUTE ONLY): 26 min  Charges:  $Gait Training: 23-37  mins                    Reinaldo Berber, PT, DPT Acute Rehab Services Pager: [phone]     Reinaldo Berber 08/27/2018, 11:01 AM

## 2018-08-27 NOTE — Progress Notes (Signed)
OT Cancellation Note  Patient Details Name: Philip Matthews MRN: [id number] DOB: 04-10-1928   Cancelled Treatment:    Reason Eval/Treat Not Completed: Patient declined, no reason specified. Used video sign language interpreter, and patient/family refused OT at this time.  Will re-attempt as able.   Delight Stare, OTR/L  Pager [place] 08/27/2018, 3:17 PM

## 2018-08-28 LAB — BASIC METABOLIC PANEL
ANION GAP: 7 (ref 5–15)
ANION GAP: 7 (ref 5–15)
Anion gap: 9 (ref 5–15)
BUN: 56 mg/dL — ABNORMAL HIGH (ref 8–23)
BUN: 64 mg/dL — ABNORMAL HIGH (ref 8–23)
BUN: 65 mg/dL — AB (ref 8–23)
CHLORIDE: 107 mmol/L (ref 98–111)
CO2: 24 mmol/L (ref 22–32)
CO2: 23 mmol/L (ref 22–32)
CO2: 23 mmol/L (ref 22–32)
Calcium: 9.1 mg/dL (ref 8.9–10.3)
Calcium: 9.3 mg/dL (ref 8.9–10.3)
Calcium: 9.2 mg/dL (ref 8.9–10.3)
Chloride: 108 mmol/L (ref 98–111)
Chloride: 105 mmol/L (ref 98–111)
Creatinine, Ser: 1.83 mg/dL — ABNORMAL HIGH (ref 0.61–1.24)
Creatinine, Ser: 1.88 mg/dL — ABNORMAL HIGH (ref 0.61–1.24)
Creatinine, Ser: 2.01 mg/dL — ABNORMAL HIGH (ref 0.61–1.24)
GFR calc Af Amer: 37 mL/min — ABNORMAL LOW (ref >60)
GFR calc Af Amer: 35 mL/min — ABNORMAL LOW (ref >60)
GFR calc Af Amer: 33 mL/min — ABNORMAL LOW (ref >60)
GFR, EST NON AFRICAN AMERICAN: 32 mL/min — AB (ref >60)
GFR, EST NON AFRICAN AMERICAN: 31 mL/min — AB (ref >60)
GFR, EST NON AFRICAN AMERICAN: 28 mL/min — AB (ref >60)
Glucose, Bld: 128 mg/dL — ABNORMAL HIGH (ref 70–99)
Glucose, Bld: 53 mg/dL — ABNORMAL LOW (ref 70–99)
Glucose, Bld: 181 mg/dL — ABNORMAL HIGH (ref 70–99)
POTASSIUM: 5.8 mmol/L — AB (ref 3.5–5.1)
Potassium: 5.5 mmol/L — ABNORMAL HIGH (ref 3.5–5.1)
Potassium: 5.9 mmol/L — ABNORMAL HIGH (ref 3.5–5.1)
SODIUM: 138 mmol/L (ref 135–145)
SODIUM: 140 mmol/L (ref 135–145)
SODIUM: 135 mmol/L (ref 135–145)

## 2018-08-28 LAB — BASIC METABOLIC PANEL WITH GFR
Anion gap: 5 (ref 5–15)
BUN: 50 mg/dL — ABNORMAL HIGH (ref 8–23)
CO2: 25 mmol/L (ref 22–32)
Calcium: 8.9 mg/dL (ref 8.9–10.3)
Chloride: 108 mmol/L (ref 98–111)
Creatinine, Ser: 1.97 mg/dL — ABNORMAL HIGH (ref 0.61–1.24)
GFR calc Af Amer: 33 mL/min — ABNORMAL LOW
GFR calc non Af Amer: 29 mL/min — ABNORMAL LOW
Glucose, Bld: 105 mg/dL — ABNORMAL HIGH (ref 70–99)
Potassium: 5 mmol/L (ref 3.5–5.1)
Sodium: 138 mmol/L (ref 135–145)

## 2018-08-28 LAB — GLUCOSE, CAPILLARY
GLUCOSE-CAPILLARY: 112 mg/dL — AB (ref 70–99)
GLUCOSE-CAPILLARY: 119 mg/dL — AB (ref 70–99)
Glucose-Capillary: 158 mg/dL — ABNORMAL HIGH (ref 70–99)
Glucose-Capillary: 164 mg/dL — ABNORMAL HIGH (ref 70–99)

## 2018-08-28 LAB — MAGNESIUM: MAGNESIUM: 1.5 mg/dL — AB (ref 1.7–2.4)

## 2018-08-28 MED ORDER — INSULIN ASPART 100 UNIT/ML ~~LOC~~ SOLN
10.0000 [IU] | Freq: Once | SUBCUTANEOUS | Status: AC
  Administered 2018-08-28: 10 [IU] via INTRAVENOUS

## 2018-08-28 MED ORDER — PATIROMER SORBITEX CALCIUM 8.4 G PO PACK
8.4000 g | PACK | Freq: Every day | ORAL | Status: DC
Start: 2018-08-28 — End: 2018-08-28
  Administered 2018-08-28: 8.4 g via ORAL
  Filled 2018-08-28: qty 1

## 2018-08-28 MED ORDER — INSULIN ASPART 100 UNIT/ML IV SOLN
10.0000 [IU] | Freq: Once | INTRAVENOUS | Status: DC
  Filled 2018-08-28: qty 0.1

## 2018-08-28 MED ORDER — PATIROMER SORBITEX CALCIUM 8.4 G PO PACK
8.4000 g | PACK | Freq: Two times a day (BID) | ORAL | Status: DC
  Administered 2018-08-28: 8.4 g via ORAL
  Filled 2018-08-28 (×2): qty 1

## 2018-08-28 MED ORDER — ALBUTEROL SULFATE (2.5 MG/3ML) 0.083% IN NEBU
10.0000 mg | INHALATION_SOLUTION | Freq: Once | RESPIRATORY_TRACT | Status: AC
  Administered 2018-08-28: 10 mg via RESPIRATORY_TRACT
  Filled 2018-08-28: qty 12

## 2018-08-28 MED ORDER — DEXTROSE 50 % IV SOLN
1.0000 | Freq: Once | INTRAVENOUS | Status: AC
  Administered 2018-08-28: 50 mL via INTRAVENOUS
  Filled 2018-08-28: qty 50

## 2018-08-28 MED ORDER — SODIUM CHLORIDE 0.9 % IV SOLN
1.0000 g | Freq: Once | INTRAVENOUS | Status: AC
  Administered 2018-08-28: 1 g via INTRAVENOUS
  Filled 2018-08-28: qty 10

## 2018-08-28 NOTE — Evaluation (Signed)
Occupational Therapy Evaluation Patient Details Name: Philip Matthews MRN: [id number] DOB: April 27, 1928 Today's Date: 08/28/2018    History of Present Illness 84 year old male with known distal renal tubular acidosis and hyperkalemia, osteoarthritis, hypothyroidism, type 2 diabetes, congenital deafness who presents with acute hyperkalemia.   Clinical Impression   PTA patient reports he was able to complete dressing, toileting and transfers without assistance, but had supervision for bathing.  He currently requires min guard assist for toilet transfers, min guard assist for toileting, min guard for LB dressing, and supervision for grooming. Patient presents with generalized weakness, decreased activity tolerance, and impaired balance affecting independence with ADLs and mobility.  He will benefit from continued OT services while admitted and after dc ([place]) in order to maximize safety and independence with ADLs.  Patient and family agreeable to recommendations.     Follow Up Recommendations  Home health OT;Supervision/Assistance - 24 hour    Equipment Recommendations  3 in 1 bedside commode    Recommendations for Other Services       Precautions / Restrictions Precautions Precautions: Fall Precaution Comments: sign language Restrictions Weight Bearing Restrictions: No      Mobility Bed Mobility Overal bed mobility: Independent                Transfers Overall transfer level: Needs assistance Equipment used: Rolling walker (2 wheeled) Transfers: Sit to/from Stand Sit to Stand: Min guard         General transfer comment: min guard for safety, good recall of hand placement but noted heavy support of grabbars during toilet transfers    Balance Overall balance assessment: Needs assistance Sitting-balance support: No upper extremity supported;Feet supported Sitting balance-Leahy Scale: Good     Standing balance support: No upper extremity supported;During functional  activity Standing balance-Leahy Scale: Fair Standing balance comment: min guard to clos supervision for safety                           ADL either performed or assessed with clinical judgement   ADL Overall ADL's : Needs assistance/impaired     Grooming: Supervision/safety;Standing   Upper Body Bathing: Set up;Sitting   Lower Body Bathing: Min guard;Sit to/from stand   Upper Body Dressing : Set up;Sitting   Lower Body Dressing: Min guard;Sit to/from stand   Toilet Transfer: Nature conservation officer;Ambulation;RW;Grab bars Toilet Transfer Details (indicate cue type and reason): heavy use of grabbars during transfer  Toileting- Water quality scientist and Hygiene: Min guard;Sit to/from stand Toileting - Clothing Manipulation Details (indicate cue type and reason): min guard for safety in standing      Functional mobility during ADLs: Supervision/safety;Rolling walker General ADL Comments: min guard for safety with ADLs, heavy use of grabbars for toilet transfers     Vision   Vision Assessment?: No apparent visual deficits     Perception     Praxis      Pertinent Vitals/Pain Pain Assessment: No/denies pain     Hand Dominance Right   Extremity/Trunk Assessment Upper Extremity Assessment Upper Extremity Assessment: RUE deficits/detail;LUE deficits/detail RUE Deficits / Details: limited shoulder ROM to 10 degrees FF, 3+/5 MMT grossly (reports chronic B shoulder limitations ) RUE Coordination: decreased gross motor LUE Deficits / Details: limited shoulder ROM to 10 degrees FF, 3+/5 MMT grossly (reports chronic B shoulder limitations ) LUE Coordination: decreased gross motor   Lower Extremity Assessment Lower Extremity Assessment: Defer to PT evaluation       Communication Communication Communication:  Interpreter utilized;Deaf   Cognition Arousal/Alertness: Awake/alert Behavior During Therapy: WFL for tasks assessed/performed Overall Cognitive Status:  Within Functional Limits for tasks assessed                                     General Comments  interpreter used for commuincation, family at side    Exercises     Shoulder Instructions      Home Living Family/patient expects to be discharged to:: Private residence Living Arrangements: Children Available Help at Discharge: Family;Available 24 hours/day Type of Home: House Home Access: Stairs to enter [employer] of Steps: 3 Entrance Stairs-Rails: Can reach both Home Layout: One level     Bathroom Shower/Tub: Tub/shower unit;Curtain   Biochemist, clinical: Standard     Home Equipment: Environmental consultant - 2 wheels;Shower seat;Cane - quad;Grab bars - tub/shower          Prior Functioning/Environment Level of Independence: Independent with assistive device(s)        Comments: reports able to complete dressing without assist, but had supervision for bathing; limited IADLs, used RW for mobility        OT Problem List: Decreased strength;Decreased activity tolerance;Impaired balance (sitting and/or standing);Decreased safety awareness;Decreased knowledge of use of DME or AE      OT Treatment/Interventions: Self-care/ADL training;Therapeutic exercise;DME and/or AE instruction;Therapeutic activities;Patient/family education;Balance training    OT Goals(Current goals can be found in the care plan section) Acute Rehab OT Goals Patient Stated Goal: return home when ready OT Goal Formulation: With patient/family Time For Goal Achievement: 09/11/18 Potential to Achieve Goals: Good  OT Frequency: Min 2X/week   Barriers to D/C:            Co-evaluation              AM-PAC PT "6 Clicks" Daily Activity     Outcome Measure Help from another person eating meals?: None Help from another person taking care of personal grooming?: None Help from another person toileting, which includes using toliet, bedpan, or urinal?: A Little Help from another person  bathing (including washing, rinsing, drying)?: A Little Help from another person to put on and taking off regular upper body clothing?: None Help from another person to put on and taking off regular lower body clothing?: A Little 6 Click Score: 21   End of Session Equipment Utilized During Treatment: Rolling walker Nurse Communication: Mobility status  Activity Tolerance: Patient tolerated treatment well Patient left: in bed;with bed alarm set;with call bell/phone within reach;with family/visitor present;with nursing/sitter in room  OT Visit Diagnosis: Other abnormalities of gait and mobility (R26.89);Muscle weakness (generalized) (M62.81)                Time: 6294-7654 OT Time Calculation (min): 22 min Charges:  OT General Charges $OT Visit: 1 Visit OT Evaluation $OT Eval Moderate Complexity: 1 [address], OTR/L  Pager [place] 08/28/2018, 12:50 PM

## 2018-08-28 NOTE — Care Management Note (Addendum)
Case Management Note  Patient Details  Name: Philip Matthews MRN: [id number] Date of Birth: 04-Jun-1928  Subjective/Objective: Pt presented for Hyperkalemia and Bradycardia. PTA from home with support of family. Interpreter in the room at time of visit. Pt is active with [facility] for [facility] RN Services. PT Services to be added-Patient will need HH/RN/ PT/OT orders and F2F with DME 3n1.                  Action/Plan: Dan with [facility] is aware that patient is active for services. SOC to begin within 24-48 hours post transition home. DME to be delivered to room prior to transition home. No further needs from CM at this time.   Expected Discharge Date:                  Expected Discharge Plan:  [place]  In-House Referral:  Interpreting Services Discharge planning Services  CM Consult  Post Acute Care Choice:  Home Health, Resumption of Svcs/PTA Provider, Durable Medical Equipment. Choice offered to:  Patient  DME Arranged:  3n1 DME Agency:  [place] Arranged:  RN, Disease Management, PT, OT [place] Agency:  [place]  Status of Service:  Completed, signed off  If discussed at [place] of Stay Meetings, dates discussed:    Additional Comments:  Bethena Roys, RN 08/28/2018, 12:10 PM

## 2018-08-28 NOTE — Progress Notes (Signed)
Physical Therapy Treatment Patient Details Name: Philip Matthews MRN: [id number] DOB: Jul 26, 1928 Today's Date: 08/28/2018    History of Present Illness 84 year old male with known distal renal tubular acidosis and hyperkalemia, osteoarthritis, hypothyroidism, type 2 diabetes, congenital deafness who presents with acute hyperkalemia.    PT Comments    Patient progressing with therapy, now ambulating stairs with min guard/supervision. Discussed with patient to have family present while entering home on stairs, he agrees. mobilizing quite well, supervision level for safety with all OOB mobility, ambulating much faster. HRmax 95 this session with activity.      Follow Up Recommendations  Home health PT;Supervision for mobility/OOB     Equipment Recommendations  None recommended by PT    Recommendations for Other Services       Precautions / Restrictions Precautions Precautions: Fall Precaution Comments: sign language Restrictions Weight Bearing Restrictions: No    Mobility  Bed Mobility Overal bed mobility: Independent                Transfers Overall transfer level: Needs assistance Equipment used: Rolling walker (2 wheeled) Transfers: Sit to/from Stand Sit to Stand: Supervision         General transfer comment: min guard for safety, good recall of hand placement but noted heavy support of grabbars during toilet transfers  Ambulation/Gait Ambulation/Gait assistance: Supervision Gait Distance (Feet): 200 Feet Assistive device: Rolling walker (2 wheeled) Gait Pattern/deviations: Step-to pattern Gait velocity: decreased   General Gait Details: ambulating even quicker t his visit, HRmax 80   Stairs Stairs: Yes Stairs assistance: Min guard Stair Management: One rail Left;One rail Right;Step to pattern Number of Stairs: 6 General stair comments: pt ambulating stairs with guard/supervision. advised pt to have family with him while going on, no overt  LOB   Wheelchair Mobility    Modified Rankin (Stroke Patients Only)       Balance Overall balance assessment: Needs assistance Sitting-balance support: No upper extremity supported;Feet supported Sitting balance-Leahy Scale: Good     Standing balance support: No upper extremity supported;During functional activity Standing balance-Leahy Scale: Fair Standing balance comment: min guard to clos supervision for safety                            Cognition Arousal/Alertness: Awake/alert Behavior During Therapy: WFL for tasks assessed/performed Overall Cognitive Status: Within Functional Limits for tasks assessed                                        Exercises      General Comments General comments (skin integrity, edema, etc.): interpreter used for commuincation, family at side      Pertinent Vitals/Pain Pain Assessment: No/denies pain    Home Living Family/patient expects to be discharged to:: Private residence Living Arrangements: Children Available Help at Discharge: Family;Available 24 hours/day Type of Home: House Home Access: Stairs to enter Entrance Stairs-Rails: Can reach both Home Layout: One level Home Equipment: Walker - 2 wheels;Shower seat;Cane - quad;Grab bars - tub/shower      Prior Function Level of Independence: Independent with assistive device(s)      Comments: reports able to complete dressing without assist, but had supervision for bathing; limited IADLs, used RW for mobility   PT Goals (current goals can now be found in the care plan section) Acute Rehab PT Goals Patient Stated Goal: return home when  ready PT Goal Formulation: With patient Time For Goal Achievement: 09/02/18 Potential to Achieve Goals: Good Progress towards PT goals: Progressing toward goals    Frequency    Min 3X/week      PT Plan Current plan remains appropriate    Co-evaluation              AM-PAC PT "6 Clicks" Daily  Activity  Outcome Measure  Difficulty turning over in bed (including adjusting bedclothes, sheets and blankets)?: None Difficulty moving from lying on back to sitting on the side of the bed? : None Difficulty sitting down on and standing up from a chair with arms (e.g., wheelchair, bedside commode, etc,.)?: None Help needed moving to and from a bed to chair (including a wheelchair)?: A Little Help needed walking in hospital room?: A Little Help needed climbing 3-5 steps with a railing? : A Little 6 Click Score: 21    End of Session Equipment Utilized During Treatment: Gait belt Activity Tolerance: Patient tolerated treatment well Patient left: in chair;with call bell/phone within reach Nurse Communication: Mobility status PT Visit Diagnosis: Unsteadiness on feet (R26.81);Muscle weakness (generalized) (M62.81)     Time: 7741-2878 PT Time Calculation (min) (ACUTE ONLY): 29 min  Charges:  $Gait Training: 23-37 mins                     Reinaldo Berber, PT, DPT Acute Rehab Services Pager: [phone]     Reinaldo Berber 08/28/2018, 3:14 PM

## 2018-08-28 NOTE — Care Management Important Message (Signed)
Important Message  Patient Details  Name: Philip Matthews MRN: [id number] Date of Birth: Jun 01, 1928   Medicare Important Message Given:  Yes    Sundai Probert P Zeferino Mounts 08/28/2018, 4:24 PM

## 2018-08-28 NOTE — Progress Notes (Addendum)
I have interviewed and examined the patient.  I have discussed the case and verified the key findings with Dr. Ouida Sills.   I agree with their assessments and plans as documented in their note for today.       Family Medicine Teaching Service Daily Progress Note Intern Pager: [phone]  Patient name: Philip Matthews Medical record number: [id number] Date of birth: 10/24/1928 Age: 84 y.o. Gender: male  Primary Care Provider: Rory Percy, DO Consultants: none Code Status: full  Pt Overview and Major Events to Date:  Admit 8/27  Assessment and Plan: Hyperkalemia 2/2 RTA, non adherent to med prior to admission: K+ today is 5.0 - continue patiromer 8.4g daily x 1 week (can increase dose in increments of 8.4mg  to  25.2mg  daily max) - BMP at 1500 today -consider additional insulin with D50 if not reducing -continue Lasix -can discharge today if K remains in normal level this afternoon and EKG stable  Bradycardia:resolved with treatment for hyperkalemia. HR is 60-68 today -consult cards if bradycardia resumes after hyperkalemia resolved -avoid AV nodal agents such as BB or non-dehydrating CCC, digoxin due to 1st degree AV block shown on EKG -repeat EKG prior to discharge with normalized K+  Anemia:Hgb stable- 10.0 today- at baseline. Receives procrit with nephro. Hx of colonoscopy in 2011withdiverticulosis of colon  -Continue to follow  - Continue iron supplement  CKDstage 3: Cr today 1.91 GFR 35. Home lasix is 40mg  PO QD.  - BMP today at 1500 - continue home Lasix, hold for increased creatinine tomorrow  -consider adding fluids  T2DM: Hgb A1C 8.9 07/21/18. Home regimen includesvictoza.  - Sensitive sliding scale  - hold victoza  Hypertension:home: Hydralazine 50mg  TID, Lasix 40mg  daily - continue home meds -continue to monitor vitals  Hypothyroidism TSH this admission is normal - Continue home dose Synthroid   HLD  -continue statin  Primary open angle  glaucoma -continue home drops  Gout on allopurinol at home.  - Continue Allopurinol  Memory decline - daughter reports he is confused at times since last admission. May benefit from dementia eval.  -consider outpatient geri appt vs MMSE  Care coordination  -pharmacy review -PT/OT eval -consider additional [place] services -lives at home with his daughter   FEN/GI: heart healthy diet Prophylaxis: lovenox  Disposition: transfer to med-surg  Subjective:  Overnight: no acute events Today: used ASL translater #[id number] to speak to patient in morning- he feels well and is excited to go home. He states that he did have a small BM s/p enema and feels much better today.  Objective: Temp:  [97.9 F (36.6 C)-98.4 F (36.9 C)] 98.1 F (36.7 C) (08/30 0627) Pulse Rate:  [63-71] 63 (08/30 0627) Resp:  [19-27] 19 (08/29 1959) BP: (137-159)/(61-73) 155/69 (08/30 0627) SpO2:  [98 %-100 %] 98 % (08/30 0627) Weight:  [49.9 kg] 49.9 kg (08/30 0627)   Physical Exam: General: NAD Cardiovascular: RRR, no murmmers appreciated Respiratory: CTAB Abdomen: soft, non-tender, normoactive BS in all 4 quadrants Extremities: No edema, well-perfused. Hands have chronic deformities at DIP perhaps due to osteoarthritis but no history  Laboratory: Recent Labs  Lab 08/25/18 0934 08/26/18 0153  WBC 4.4 4.5  HGB 11.3* 10.0*  HCT 37.9* 32.8*  PLT PLATELET CLUMPS NOTED ON SMEAR, COUNT APPEARS DECREASED 141*   Recent Labs  Lab 08/25/18 0934  08/27/18 0828 08/27/18 1543 08/28/18 0355  NA 141   < > 140 140 138  K 6.5*   < > 5.2* 5.4* 5.0  CL  115*   < > 110 107 108  CO2 20*   < > 25 27 25   BUN 53*   < > 38* 47* 50*  CREATININE 1.91*   < > 1.67* 1.91* 1.97*  CALCIUM 8.9   < > 8.7* 8.9 8.9  PROT 7.0  --   --   --   --   BILITOT 0.5  --   --   --   --   ALKPHOS 67  --   --   --   --   ALT 39  --   --   --   --   AST 54*  --   --   --   --   GLUCOSE 80   < > 112* 133* 105*   < > = values in this  interval not displayed.    Imaging/Diagnostic Tests:  Richarda Osmond, DO 08/28/2018, 7:24 AM PGY-1, [place] Intern pager: [phone], text pages welcome

## 2018-08-29 LAB — BASIC METABOLIC PANEL
ANION GAP: 9 (ref 5–15)
ANION GAP: 4 — AB (ref 5–15)
Anion gap: 8 (ref 5–15)
Anion gap: 9 (ref 5–15)
BUN: 64 mg/dL — ABNORMAL HIGH (ref 8–23)
BUN: 63 mg/dL — ABNORMAL HIGH (ref 8–23)
BUN: 62 mg/dL — AB (ref 8–23)
BUN: 69 mg/dL — AB (ref 8–23)
CHLORIDE: 105 mmol/L (ref 98–111)
CHLORIDE: 102 mmol/L (ref 98–111)
CHLORIDE: 101 mmol/L (ref 98–111)
CO2: 21 mmol/L — AB (ref 22–32)
CO2: 28 mmol/L (ref 22–32)
CO2: 24 mmol/L (ref 22–32)
CO2: 21 mmol/L — ABNORMAL LOW (ref 22–32)
CREATININE: 1.99 mg/dL — AB (ref 0.61–1.24)
CREATININE: 2.16 mg/dL — AB (ref 0.61–1.24)
Calcium: 9.6 mg/dL (ref 8.9–10.3)
Calcium: 9.7 mg/dL (ref 8.9–10.3)
Calcium: 9.2 mg/dL (ref 8.9–10.3)
Calcium: 8.7 mg/dL — ABNORMAL LOW (ref 8.9–10.3)
Chloride: 106 mmol/L (ref 98–111)
Creatinine, Ser: 2.09 mg/dL — ABNORMAL HIGH (ref 0.61–1.24)
Creatinine, Ser: 2.04 mg/dL — ABNORMAL HIGH (ref 0.61–1.24)
GFR calc Af Amer: 33 mL/min — ABNORMAL LOW (ref >60)
GFR calc Af Amer: 30 mL/min — ABNORMAL LOW (ref >60)
GFR calc non Af Amer: 27 mL/min — ABNORMAL LOW (ref >60)
GFR calc non Af Amer: 28 mL/min — ABNORMAL LOW (ref >60)
GFR calc non Af Amer: 28 mL/min — ABNORMAL LOW (ref >60)
GFR calc non Af Amer: 26 mL/min — ABNORMAL LOW (ref >60)
GFR, EST AFRICAN AMERICAN: 31 mL/min — AB (ref >60)
GFR, EST AFRICAN AMERICAN: 32 mL/min — AB (ref >60)
GLUCOSE: 84 mg/dL (ref 70–99)
GLUCOSE: 154 mg/dL — AB (ref 70–99)
Glucose, Bld: 178 mg/dL — ABNORMAL HIGH (ref 70–99)
Glucose, Bld: 188 mg/dL — ABNORMAL HIGH (ref 70–99)
Potassium: 5.4 mmol/L — ABNORMAL HIGH (ref 3.5–5.1)
Potassium: 5.9 mmol/L — ABNORMAL HIGH (ref 3.5–5.1)
Potassium: 6 mmol/L — ABNORMAL HIGH (ref 3.5–5.1)
Potassium: 5.1 mmol/L (ref 3.5–5.1)
SODIUM: 134 mmol/L — AB (ref 135–145)
SODIUM: 131 mmol/L — AB (ref 135–145)
Sodium: 135 mmol/L (ref 135–145)
Sodium: 138 mmol/L (ref 135–145)

## 2018-08-29 LAB — CBC
HEMATOCRIT: 35.4 % — AB (ref 39.0–52.0)
HEMOGLOBIN: 10.8 g/dL — AB (ref 13.0–17.0)
MCH: 27.3 pg (ref 26.0–34.0)
MCHC: 30.5 g/dL (ref 30.0–36.0)
MCV: 89.4 fL (ref 78.0–100.0)
Platelets: 145 10*3/uL — ABNORMAL LOW (ref 150–400)
RBC: 3.96 MIL/uL — ABNORMAL LOW (ref 4.22–5.81)
RDW: 17.8 % — AB (ref 11.5–15.5)
WBC: 5.1 10*3/uL (ref 4.0–10.5)

## 2018-08-29 LAB — GLUCOSE, CAPILLARY
GLUCOSE-CAPILLARY: 19 mg/dL — AB (ref 70–99)
GLUCOSE-CAPILLARY: 170 mg/dL — AB (ref 70–99)
GLUCOSE-CAPILLARY: 163 mg/dL — AB (ref 70–99)
Glucose-Capillary: 86 mg/dL (ref 70–99)
Glucose-Capillary: 143 mg/dL — ABNORMAL HIGH (ref 70–99)
Glucose-Capillary: 156 mg/dL — ABNORMAL HIGH (ref 70–99)

## 2018-08-29 MED ORDER — SODIUM ZIRCONIUM CYCLOSILICATE 5 G PO PACK
5.0000 g | PACK | Freq: Two times a day (BID) | ORAL | Status: DC
  Administered 2018-08-29 – 2018-08-30 (×4): 5 g via ORAL
  Filled 2018-08-29 (×7): qty 1

## 2018-08-29 MED ORDER — DEXTROSE 50 % IV SOLN
1.0000 | Freq: Once | INTRAVENOUS | Status: AC
  Administered 2018-08-29: 50 mL via INTRAVENOUS

## 2018-08-29 MED ORDER — MAGNESIUM SULFATE 2 GM/50ML IV SOLN
2.0000 g | Freq: Once | INTRAVENOUS | Status: AC
  Administered 2018-08-29: 2 g via INTRAVENOUS
  Filled 2018-08-29: qty 50

## 2018-08-29 MED ORDER — DEXTROSE 50 % IV SOLN
INTRAVENOUS | Status: AC
  Filled 2018-08-29: qty 50

## 2018-08-29 NOTE — Consult Note (Signed)
Reason for Consult: Persistent hyperkalemia in patient with chronic kidney disease stage IV Referring Physician: Esmond Camper MD (FPTS)   HPI: (Some portions of history obtained using the ASL translator) 84 year old African-American man well-known to me from outpatient follow-up for his chronic kidney disease stage IV that appears to be from hypertension versus age-related nephrosclerosis along with intermittent hyperkalemia which by previous work-up is likely from renal tubular handling defect.  He hearing/speech impaired since birth and has history of dyslipidemia and hypothyroidism as well.  He is well aware (and under the care of his daughter) follows a low potassium diet but had not been taking his Kionex prescribed twice a week because of diarrhea.  He denies any chest pain or shortness of breath but had some pedal edema prior to his admission here.  He denies any dysuria, urgency, frequency, flank pain, fever or chills.  Past Medical History:  Diagnosis Date  . Anemia   . Arthritis    "joints" (08/27/2017)  . ARTHRITIS, LEFT SHOULDER 02/01/2010   Qualifier: Diagnosis of  By: Jacques Earthly    . Chronic kidney disease (CKD), stage IV (severe) ([place])   . Deaf   . Diverticulosis   . DIVERTICULOSIS OF COLON 03/08/2010   Qualifier: Diagnosis of  By: Jacques Earthly    . Esophagitis    HSV  . Gout   . History of DVT (deep vein thrombosis)   . Hyperlipidemia   . Hypertension   . Hypothyroidism   . Kidney mass    lipoma  . Liver nodule    u/s 310  . Portal vein thrombosis    on life long coumadin  . PUD, HX OF 03/08/2010   Qualifier: History of  By: Jacques Earthly    . Type II diabetes mellitus ([place])     Past Surgical History:  Procedure Laterality Date  . COLON SURGERY     "something was twisted; that messed w/my BM; had to have it untwisted"  . HERNIA REPAIR    . LIVER LOBECTOMY    . SHOULDER ARTHROCENTESIS Left     Family History  Problem Relation Age of Onset   . Cancer Father        prostate  . Heart disease Mother     Social History:  reports that he quit smoking about 7 years ago. His smoking use included cigarettes. He has never used smokeless tobacco. He reports that he does not drink alcohol or use drugs.  Allergies: No Known Allergies  Medications:  Scheduled: . allopurinol  100 mg Oral Daily  . brimonidine  1 drop Both Eyes Q12H   And  . timolol  1 drop Both Eyes BID  . calcitRIOL  0.25 mcg Oral Q M,W,F  . dextrose      . enoxaparin (LOVENOX) injection  30 mg Subcutaneous Q24H  . hydrALAZINE  50 mg Oral TID  . latanoprost  1 drop Both Eyes QHS  . levothyroxine  75 mcg Oral QAC breakfast  . polyethylene glycol  17 g Oral BID  . pravastatin  40 mg Oral q1800  . sodium bicarbonate  650 mg Oral BID  . sodium chloride flush  3 mL Intravenous Q12H  . sodium zirconium cyclosilicate  5 g Oral BID    BMP Latest Ref Rng & Units 08/29/2018 08/29/2018 08/29/2018  Glucose 70 - 99 mg/dL 154(H) 84 178(H)  BUN 8 - 23 mg/dL 62(H) 63(H) 64(H)  Creatinine 0.61 - 1.24 mg/dL 1.99(H) 2.04(H) 2.09(H)  BUN/Creat Ratio 10 - 24 - - -  Sodium 135 - 145 mmol/L 134(L) 138 135  Potassium 3.5 - 5.1 mmol/L 6.0(H) 5.9(H) 5.4(H)  Chloride 98 - 111 mmol/L 102 106 105  CO2 22 - 32 mmol/L 24 28 21(L)  Calcium 8.9 - 10.3 mg/dL 9.2 9.7 9.6   CBC Latest Ref Rng & Units 08/29/2018 08/26/2018 08/25/2018  WBC 4.0 - 10.5 K/uL 5.1 4.5 4.4  Hemoglobin 13.0 - 17.0 g/dL 10.8(L) 10.0(L) 11.3(L)  Hematocrit 39.0 - 52.0 % 35.4(L) 32.8(L) 37.9(L)  Platelets 150 - 400 K/uL 145(L) 141(L) PLATELET CLUMPS NOTED ON SMEAR, COUNT APPEARS DECREASED     No results found.  Review of Systems  Constitutional: Negative.   HENT: Negative.   Eyes: Negative.   Respiratory: Negative.   Cardiovascular: Negative.   Gastrointestinal: Negative.   Genitourinary: Negative.   Musculoskeletal: Positive for joint pain.       Mainly in the shoulders  Skin: Negative.    Blood pressure  (!) 131/58, pulse (!) 59, temperature 98 F (36.7 C), temperature source Oral, resp. rate 16, height 5\' 5"  (1.651 m), weight 49.6 kg, SpO2 100 %. Physical Exam  Nursing note and vitals reviewed. Constitutional: He appears well-developed and well-nourished.  HENT:  Head: Normocephalic and atraumatic.  Mouth/Throat: Oropharynx is clear and moist.  Eyes: Pupils are equal, round, and reactive to light. Conjunctivae and EOM are normal. No scleral icterus.  Neck: Normal range of motion. Neck supple. No JVD present.  Cardiovascular: Normal rate, regular rhythm and normal heart sounds.  Respiratory: Effort normal and breath sounds normal. He has no wheezes. He has no rales.  GI: Soft. Bowel sounds are normal. There is no tenderness. There is no rebound.  Musculoskeletal: Normal range of motion. He exhibits no edema.  Neurological: He is alert.  Skin: Skin is warm and dry. No rash noted. No erythema.    Assessment/Plan: 1.  Refractory hyperkalemia in patient with chronic kidney disease stage IV: Will switch him from patiromer to [facility] to try and see if we can improve efficacy of potassium lowering.  I have discussed with the family practice teaching service to go ahead and discontinue his Lovenox and place him on sequential compression devices for DVT prophylaxis as I suspect the effect of Lovenox in the mineralocorticoid axis might be increasing his hyperkalemia.  I have requested for a plasma potassium to ensure that he does not have pseudohyperkalemia possibly from hemolysis/fist clenching.  No acute arrhythmias noted overnight.   2.  Chronic kidney disease stage IV: Renal function remains at baseline with a creatinine of around 2.0.  Continue sodium bicarbonate at this time for mild non-anion gap metabolic acidosis. 3.  Hypertension: Blood pressures within acceptable range, monitor for need to restart antihypertensive therapy.  Currently he is not on a diuretic. 4.  Anemia: Hemoglobin relatively  stable, continue ESA with intermittent iron supplementation as an outpatient.  Brittny Spangle K. 08/29/2018, 1:36 PM

## 2018-08-29 NOTE — Progress Notes (Signed)
Family Medicine Teaching Service Daily Progress Note Intern Pager: [phone]  Patient name: Philip Matthews Medical record number: [id number] Date of birth: 1928/06/06 Age: 84 y.o. Gender: male  Primary Care Provider: Rory Percy, DO Consultants: none Code Status: full  Pt Overview and Major Events to Date:  Admit 8/27  Assessment and Plan: Hyperkalemia 2/2 RTA, non adherent to med prior to admission: K+ today is 5.9 s/p increasing patiromer to BID yesterday, given insulin and albuterol over night which had decreased his K to 5.4 and decreased blood glucose to in the 20's. - continue patiromer 8.4g BID - repeat BMP at 1500 today -consulted nephro -continue Lasix -repeat EKG this afternoon  Bradycardia:resolved -avoid AV nodal agents such as BB or non-dehydrating CCC, digoxin due to 1st degree AV block shown on EKG -repeat EKG prior to discharge with normalized K+  Anemia:Hgb stable- 10.0 today- at baseline. Receives procrit with nephro. Rechecked CBC today -Continue to follow  - Continue iron supplement  CKDstage 3: Home lasix is 40mg  PO QD.  - repeat BMP today - continue home Lasix  T2DM: Hgb A1C 8.9 07/21/18. Home regimen includesvictoza. Patient was given insulin overnight to treat hyperkalemia and had decrease of blood glucose to 20's. Has resolved with juice and D5 IVf x 5 hours. CBG today is 86. - Sensitive sliding scale  - hold victoza  Hypertension:home: Hydralazine 50mg  TID, Lasix 40mg  daily - continue home meds -continue to monitor vitals  Hypothyroidism TSH this admission is normal - Continue home dose Synthroid   HLD  -continue statin  Primary open angle glaucoma -continue home drops  Gout on allopurinol at home.  - Continue Allopurinol  Memory decline - daughter reports he is confused at times since last admission. May benefit from dementia eval. Spoke to patient with interpreter [phone] on tablet this morning and patient stated he  understood and agreed with the plan. Then, live interpreter arrived to room and stated that patient was confused about the plan so I explained the plan again with confirmation and teach back from the patient to ensure that he understood the plan today. May need to talk to daughter whom the patient lives with Vito Backers) to reiterate the plan for in the hospital and at discharge. -consider outpatient geri appt vs MMSE  Care coordination  -pharmacy review -PT/OT eval -consider additional [place] services -lives at home with his daughter   FEN/GI: heart healthy diet Prophylaxis: lovenox  Disposition: transfer to med-surg  Subjective:  Overnight: increased potassium to 5.9 lead to albuterol and insulin/D5 IVF administration. Patient had subsequent low blood sugar but felt well and resolved with juice and IV D5.  Today: K elevated to 5.9-used ASL translater #[id number] to speak to patient in morning- also used live interpreter today to reconfirm that the patient understood and agreed with the plan.   Objective: Temp:  [98 F (36.7 C)-98.5 F (36.9 C)] 98 F (36.7 C) (08/31 0546) Pulse Rate:  [59-67] 59 (08/31 0546) Resp:  [16-23] 16 (08/31 0546) BP: (131-159)/(58-73) 131/58 (08/31 0810) SpO2:  [96 %-100 %] 100 % (08/31 0546) Weight:  [49.6 kg] 49.6 kg (08/31 0546)   Physical Exam: General: NAD Cardiovascular: RRR, no murmmers appreciated Respiratory: CTAB Abdomen: soft, non-tender, normoactive BS in all 4 quadrants Extremities: No edema, well-perfused. Hands have chronic deformities at DIP perhaps due to osteoarthritis but no history  Laboratory: Recent Labs  Lab 08/25/18 0934 08/26/18 0153  WBC 4.4 4.5  HGB 11.3* 10.0*  HCT 37.9* 32.8*  PLT  PLATELET CLUMPS NOTED ON SMEAR, COUNT APPEARS DECREASED 141*   Recent Labs  Lab 08/25/18 0934  08/28/18 2246 08/29/18 0249 08/29/18 0632  NA 141   < > 135 135 138  K 6.5*   < > 5.8* 5.4* 5.9*  CL 115*   < > 105 105 106  CO2 20*   < > 23  21* 28  BUN 53*   < > 65* 64* 63*  CREATININE 1.91*   < > 2.01* 2.09* 2.04*  CALCIUM 8.9   < > 9.2 9.6 9.7  PROT 7.0  --   --   --   --   BILITOT 0.5  --   --   --   --   ALKPHOS 67  --   --   --   --   ALT 39  --   --   --   --   AST 54*  --   --   --   --   GLUCOSE 80   < > 181* 178* 84   < > = values in this interval not displayed.    Imaging/Diagnostic Tests:  Richarda Osmond, DO 08/29/2018, 9:27 AM PGY-1, [place] Intern pager: [phone], text pages welcome

## 2018-08-30 LAB — BASIC METABOLIC PANEL
ANION GAP: 8 (ref 5–15)
Anion gap: 7 (ref 5–15)
Anion gap: 7 (ref 5–15)
BUN: 70 mg/dL — ABNORMAL HIGH (ref 8–23)
BUN: 66 mg/dL — ABNORMAL HIGH (ref 8–23)
BUN: 69 mg/dL — ABNORMAL HIGH (ref 8–23)
CALCIUM: 8.6 mg/dL — AB (ref 8.9–10.3)
CALCIUM: 9 mg/dL (ref 8.9–10.3)
CALCIUM: 8.8 mg/dL — AB (ref 8.9–10.3)
CO2: 23 mmol/L (ref 22–32)
CO2: 25 mmol/L (ref 22–32)
CO2: 24 mmol/L (ref 22–32)
CREATININE: 2.02 mg/dL — AB (ref 0.61–1.24)
CREATININE: 2.21 mg/dL — AB (ref 0.61–1.24)
Chloride: 102 mmol/L (ref 98–111)
Chloride: 104 mmol/L (ref 98–111)
Chloride: 103 mmol/L (ref 98–111)
Creatinine, Ser: 2.12 mg/dL — ABNORMAL HIGH (ref 0.61–1.24)
GFR calc Af Amer: 32 mL/min — ABNORMAL LOW (ref >60)
GFR calc non Af Amer: 26 mL/min — ABNORMAL LOW (ref >60)
GFR calc non Af Amer: 28 mL/min — ABNORMAL LOW (ref >60)
GFR calc non Af Amer: 25 mL/min — ABNORMAL LOW (ref >60)
GFR, EST AFRICAN AMERICAN: 29 mL/min — AB (ref >60)
GFR, EST AFRICAN AMERICAN: 31 mL/min — AB (ref >60)
GLUCOSE: 243 mg/dL — AB (ref 70–99)
GLUCOSE: 159 mg/dL — AB (ref 70–99)
GLUCOSE: 146 mg/dL — AB (ref 70–99)
Potassium: 5.5 mmol/L — ABNORMAL HIGH (ref 3.5–5.1)
Potassium: 5.2 mmol/L — ABNORMAL HIGH (ref 3.5–5.1)
Potassium: 4.8 mmol/L (ref 3.5–5.1)
Sodium: 134 mmol/L — ABNORMAL LOW (ref 135–145)
Sodium: 135 mmol/L (ref 135–145)
Sodium: 134 mmol/L — ABNORMAL LOW (ref 135–145)

## 2018-08-30 LAB — GLUCOSE, CAPILLARY
GLUCOSE-CAPILLARY: 121 mg/dL — AB (ref 70–99)
GLUCOSE-CAPILLARY: 143 mg/dL — AB (ref 70–99)
GLUCOSE-CAPILLARY: 151 mg/dL — AB (ref 70–99)
GLUCOSE-CAPILLARY: 170 mg/dL — AB (ref 70–99)
Glucose-Capillary: 194 mg/dL — ABNORMAL HIGH (ref 70–99)
Glucose-Capillary: 176 mg/dL — ABNORMAL HIGH (ref 70–99)

## 2018-08-30 LAB — URIC ACID: Uric Acid, Serum: 5.2 mg/dL (ref 3.7–8.6)

## 2018-08-30 MED ORDER — FUROSEMIDE 20 MG PO TABS
20.0000 mg | ORAL_TABLET | Freq: Every day | ORAL | Status: DC
  Administered 2018-08-30 – 2018-08-31 (×2): 20 mg via ORAL
  Filled 2018-08-30 (×2): qty 1

## 2018-08-30 NOTE — Progress Notes (Signed)
0002 Patient experienced 2 pauses less then 2 seconds. Patient is asymptomatic. Will continue to monitor

## 2018-08-30 NOTE — Progress Notes (Addendum)
Patient ID: Philip Matthews, male   DOB: 01-30-1928, 84 y.o.   MRN: [id number] [facility] Progress Note   Assessment/ Plan:   1.  Refractory hyperkalemia in patient with chronic kidney disease stage IV:  With tubular handling defect based on previous work-up.  Continue Lokelma at 5 gm daily beginning tomorrow.  From a renal standpoint, appears stable enough to discharge home today after his 2pm labs with follow-up labs next week on Wednesday or Thursday by home health nursing with instructions to fax results to [phone] (attention: Dr. Posey Pronto).   2.  Chronic kidney disease stage IV: Renal function remains at baseline with a creatinine of around 2.0.  Continue sodium bicarbonate at this time for mild non-anion gap metabolic acidosis and will start low-dose furosemide with his rising blood pressure (which will further help control potassium). 3.  Hypertension: Blood pressures appear to be rising overnight-restart furosemide at 20 mg daily. 4.  Anemia: Hemoglobin relatively stable, continue ESA with intermittent iron supplementation as an outpatient.  Subjective:   Without acute event noted overnight.  Labs reviewed from this morning.   Objective:   BP (!) 167/68 (BP Location: Left Arm)   Pulse 67   Temp 98.2 F (36.8 C) (Oral)   Resp 20   Ht 5\' 5"  (1.651 m)   Wt 50 kg   SpO2 100%   BMI 18.34 kg/m   Intake/Output Summary (Last 24 hours) at 08/30/2018 1056 Last data filed at 08/30/2018 0927 Gross per 24 hour  Intake 960 ml  Output 1775 ml  Net -815 ml   Weight change: 0.408 kg  Physical Exam: Gen: Comfortably resting in bed CVS: Pulse regular rhythm, normal rate, S1 and S2 normal Resp: Clear to auscultation, no rales/rhonchi Abd: Soft, flat, nontender Ext: No lower extremity edema  Imaging: No results found.  Labs: BMET Recent Labs  Lab 08/28/18 2246 08/29/18 0249 08/29/18 5397 08/29/18 1125 08/29/18 1824 08/30/18 0047 08/30/18 0807  NA 135 135 138 134*  131* 134* 135  K 5.8* 5.4* 5.9* 6.0* 5.1 5.5* 5.2*  CL 105 105 106 102 101 102 104  CO2 23 21* 28 24 21* 25 24  GLUCOSE 181* 178* 84 154* 188* 159* 146*  BUN 65* 64* 63* 62* 69* 70* 66*  CREATININE 2.01* 2.09* 2.04* 1.99* 2.16* 2.12* 2.02*  CALCIUM 9.2 9.6 9.7 9.2 8.7* 9.0 8.8*   CBC Recent Labs  Lab 08/25/18 0934 08/26/18 0153 08/29/18 1125  WBC 4.4 4.5 5.1  NEUTROABS 2.6  --   --   HGB 11.3* 10.0* 10.8*  HCT 37.9* 32.8* 35.4*  MCV 91.3 90.1 89.4  PLT PLATELET CLUMPS NOTED ON SMEAR, COUNT APPEARS DECREASED 141* 145*    Medications:    . allopurinol  100 mg Oral Daily  . brimonidine  1 drop Both Eyes Q12H   And  . timolol  1 drop Both Eyes BID  . calcitRIOL  0.25 mcg Oral Q M,W,F  . hydrALAZINE  50 mg Oral TID  . latanoprost  1 drop Both Eyes QHS  . levothyroxine  75 mcg Oral QAC breakfast  . polyethylene glycol  17 g Oral BID  . pravastatin  40 mg Oral q1800  . sodium bicarbonate  650 mg Oral BID  . sodium chloride flush  3 mL Intravenous Q12H  . sodium zirconium cyclosilicate  5 g Oral BID   Elmarie Shiley, MD 08/30/2018, 10:56 AM

## 2018-08-30 NOTE — Progress Notes (Signed)
Family Medicine Teaching Service Daily Progress Note Intern Pager: [phone]  Patient name: Philip Matthews Medical record number: [id number] Date of birth: [birth year] Age: 84 y.o. Gender: male  Primary Care Provider: Rory Percy, DO Consultants: Nephrology Code Status: full  Pt Overview and Major Events to Date:  Admit 8/27  Assessment and Plan: Hyperkalemia 2/2 RTA, non adherent to med prior to admission: K+ today is 5.2 s/p starting Lokelma and d/c of patiromer - continue Lokelma 5g BID - nephrology consulted, appreciate recommendations - d/c of lovenox per nephro  Bradycardia: resolved -avoid AV nodal agents such as BB or non-dehydrating CCC, digoxin due to 1st degree AV block shown on EKG -repeat EKG on 8/31 with return to baseline  Anemia:Hgb stable- 10.0 8/31- at baseline. Receives procrit with nephro.  -Continue to follow  - Continue iron supplement  CKD,stage 3: Cr BL ~2.0, Cr 2.02 09/01. Holding home lasix 40mg .  - Trend BMP / urinary output - continue sodium bicarbonate 650mg  BID - Avoid nephrotoxic agents, ensure adequate renal perfusion - Nephrology following, appreciate recommendations  T2DM: Hgb A1C 8.9 07/21/18. Home regimen includesvictoza. CBG today is 121. - Sensitive sliding scale  - holding home victoza  Hypertension:home: Hydralazine 50mg  TID, Lasix 40mg  daily - continue home hydralazine, holding home lasix - continue to monitor vitals  Hypothyroidism TSH this admission is normal - Continue home dose Synthroid   HLD  - continue statin  Primary open angle glaucoma - continue home drops  Gout on allopurinol at home.  - Will renally dosing Allopurinol, currently on 100mg  daily  Memory decline - daughter reports he is confused at times since last admission. May benefit from dementia eval. -consider outpatient geri appt vs MMSE  Care coordination  -pharmacy review -PT/OT eval -consider additional [place] services -lives at home  with his daughter   FEN/GI: heart healthy diet Prophylaxis: SCD's  Disposition: continued inpatient stay  Subjective:  Seen with in-person interpreter this am.  Patient with only concern that he had to strain with bm 2 days ago. Requesting something to help him go easier. Otherwise he would like to go home and is feeling much better overall.   Objective: Temp:  [98.1 F (36.7 C)-98.4 F (36.9 C)] 98.2 F (36.8 C) (09/01 0529) Pulse Rate:  [63-67] 67 (09/01 0529) Resp:  [15-20] 20 (09/01 0529) BP: (137-167)/(64-70) 167/68 (09/01 0529) SpO2:  [100 %] 100 % (09/01 0529) Weight:  [50 kg] 50 kg (09/01 0529)   Physical Exam: General: NAD, pleasant Cardiovascular: RRR, no m/r/g, no LE edema Respiratory: CTA BL, normal work of breathing Gastrointestinal: soft, nontender, nondistended MSK: moves 4 extremities equally Derm: no rashes appreciated Neuro: CN II-XII grossly intact Psych: AO, appropriate affect  Laboratory: Recent Labs  Lab 08/25/18 0934 08/26/18 0153 08/29/18 1125  WBC 4.4 4.5 5.1  HGB 11.3* 10.0* 10.8*  HCT 37.9* 32.8* 35.4*  PLT PLATELET CLUMPS NOTED ON SMEAR, COUNT APPEARS DECREASED 141* 145*   Recent Labs  Lab 08/25/18 0934  08/29/18 1824 08/30/18 0047 08/30/18 0807  NA 141   < > 131* 134* 135  K 6.5*   < > 5.1 5.5* 5.2*  CL 115*   < > 101 102 104  CO2 20*   < > 21* 25 24  BUN 53*   < > 69* 70* 66*  CREATININE 1.91*   < > 2.16* 2.12* 2.02*  CALCIUM 8.9   < > 8.7* 9.0 8.8*  PROT 7.0  --   --   --   --  BILITOT 0.5  --   --   --   --   ALKPHOS 67  --   --   --   --   ALT 39  --   --   --   --   AST 54*  --   --   --   --   GLUCOSE 80   < > 188* 159* 146*   < > = values in this interval not displayed.    Imaging/Diagnostic Tests:  Maiah Sinning, [place], DO 08/30/2018, 10:29 AM PGY-2, [place] Intern pager: [phone], text pages welcome

## 2018-08-31 DIAGNOSIS — N289 Disorder of kidney and ureter, unspecified: Secondary | ICD-10-CM

## 2018-08-31 LAB — CBC
HCT: 35.2 % — ABNORMAL LOW (ref 39.0–52.0)
Hemoglobin: 10.9 g/dL — ABNORMAL LOW (ref 13.0–17.0)
MCH: 27.5 pg (ref 26.0–34.0)
MCHC: 31 g/dL (ref 30.0–36.0)
MCV: 88.9 fL (ref 78.0–100.0)
PLATELETS: 110 10*3/uL — AB (ref 150–400)
RBC: 3.96 MIL/uL — AB (ref 4.22–5.81)
RDW: 17.1 % — AB (ref 11.5–15.5)
WBC: 5.6 10*3/uL (ref 4.0–10.5)

## 2018-08-31 LAB — BASIC METABOLIC PANEL
Anion gap: 7 (ref 5–15)
BUN: 73 mg/dL — ABNORMAL HIGH (ref 8–23)
CALCIUM: 8.6 mg/dL — AB (ref 8.9–10.3)
CO2: 26 mmol/L (ref 22–32)
CREATININE: 2.08 mg/dL — AB (ref 0.61–1.24)
Chloride: 104 mmol/L (ref 98–111)
GFR calc non Af Amer: 27 mL/min — ABNORMAL LOW (ref >60)
GFR, EST AFRICAN AMERICAN: 31 mL/min — AB (ref >60)
Glucose, Bld: 155 mg/dL — ABNORMAL HIGH (ref 70–99)
Potassium: 5.3 mmol/L — ABNORMAL HIGH (ref 3.5–5.1)
SODIUM: 137 mmol/L (ref 135–145)

## 2018-08-31 LAB — GLUCOSE, CAPILLARY
GLUCOSE-CAPILLARY: 252 mg/dL — AB (ref 70–99)
Glucose-Capillary: 149 mg/dL — ABNORMAL HIGH (ref 70–99)
Glucose-Capillary: 163 mg/dL — ABNORMAL HIGH (ref 70–99)

## 2018-08-31 MED ORDER — SODIUM ZIRCONIUM CYCLOSILICATE 5 G PO PACK: 5.0000 g | PACK | Freq: Every day | ORAL | 0 refills | Status: AC

## 2018-08-31 MED ORDER — FUROSEMIDE 40 MG PO TABS: 40.0000 mg | ORAL_TABLET | Freq: Every day | ORAL | Status: DC

## 2018-08-31 MED ORDER — SODIUM ZIRCONIUM CYCLOSILICATE 5 G PO PACK
5.0000 g | PACK | Freq: Every day | ORAL | Status: DC
  Administered 2018-08-31: 5 g via ORAL
  Filled 2018-08-31: qty 1

## 2018-08-31 NOTE — Discharge Instructions (Signed)
It was a pleasure to take care of you! You were hospitalized for high potassium in your blood. We believe it was due to not taking medication to decrease the amount of potassium in your body caused by kidney disease. You were switched to a medication called, "Lokelma" which is also known as "zirconium clyclosilicate". You will continue to take this medication once daily at noon. Please remember to take this medication by itself and do not take any other oral medications within 2 hours before and after this medicine. Your potassium continued to improve and your numbers were stable for discharge.  You had one other change in your medication. Please do NOT take Aspirin 81mg  daily. You will continue to take plavix daily. You should not be on both because it could increase your bleeding risk.   You will be discharged home with Home Health Physical Therapy and Occupational Therapy. In addition, a Home Health Nurse will collect labs from you for the nephrologist on Tuesday or Wednesday of next week (September 10th or 11th). Those results will be sent to Dr. Posey Pronto, your nephrologist. Please follow up with your nephrologist and schedule a follow up appointment.   Please call us with any questions or concerns.   [place] [address] [phone]

## 2018-08-31 NOTE — Progress Notes (Signed)
Notified Dan with [facility] for resumption of [place] services- orders placed for HHRN/PT/OT- pt for transition home today.

## 2018-08-31 NOTE — Progress Notes (Addendum)
Occupational Therapy Treatment Patient Details Name: Philip Matthews MRN: [id number] DOB: 03-25-1928 Today's Date: 08/31/2018    History of present illness 84 year old male with known distal renal tubular acidosis and hyperkalemia, osteoarthritis, hypothyroidism, type 2 diabetes, congenital deafness who presents with acute hyperkalemia.   OT comments  Pt progressing well toward OT goals today completing toilet transfers and toileting hygiene with supervision today. Initially utilizing RW but putting this aside with good balance noted. Pt and family educated concerning use of 3-in-1 for shower seat and they indicated understanding. In-person interpreter, Juliann Pulse, present throughout session. Pt planning to d/c home with family and home health this afternoon.    Follow Up Recommendations  Home health OT;Supervision/Assistance - 24 hour    Equipment Recommendations  3 in 1 bedside commode    Recommendations for Other Services      Precautions / Restrictions Precautions Precautions: Fall Precaution Comments: sign language Restrictions Weight Bearing Restrictions: No       Mobility Bed Mobility Overal bed mobility: Independent                Transfers Overall transfer level: Needs assistance Equipment used: Rolling walker (2 wheeled) Transfers: Sit to/from Stand Sit to Stand: Supervision         General transfer comment: Supervision for safety.     Balance Overall balance assessment: Needs assistance Sitting-balance support: No upper extremity supported;Feet supported Sitting balance-Leahy Scale: Good     Standing balance support: No upper extremity supported;During functional activity Standing balance-Leahy Scale: Fair Standing balance comment: min guard to close supervision for safety                           ADL either performed or assessed with clinical judgement   ADL Overall ADL's : Needs assistance/impaired     Grooming:  Supervision/safety;Standing                   Toilet Transfer: Supervision/safety;Ambulation;RW;Grab bars(with and without RW) Toilet Transfer Details (indicate cue type and reason): Using grab bars to lower self onto commode.  Toileting- Clothing Manipulation and Hygiene: Supervision/safety;Sit to/from stand       Functional mobility during ADLs: Supervision/safety;Rolling walker(with and without RW) General ADL Comments: Pt able to additionally ambulate into hallway today with supervision and without RW.      Vision   Vision Assessment?: No apparent visual deficits   Perception     Praxis      Cognition Arousal/Alertness: Awake/alert Behavior During Therapy: WFL for tasks assessed/performed Overall Cognitive Status: Within Functional Limits for tasks assessed                                          Exercises     Shoulder Instructions       General Comments In-person interpreter, Juliann Pulse, present and engaged in session.     Pertinent Vitals/ Pain       Pain Assessment: No/denies pain  Home Living                                          Prior Functioning/Environment              Frequency  Min 2X/week        Progress Toward  Goals  OT Goals(current goals can now be found in the care plan section)  Progress towards OT goals: Progressing toward goals  Acute Rehab OT Goals Patient Stated Goal: return home when ready OT Goal Formulation: With patient/family Time For Goal Achievement: 09/11/18 Potential to Achieve Goals: Good  Plan Discharge plan remains appropriate    Co-evaluation                 AM-PAC PT "6 Clicks" Daily Activity     Outcome Measure   Help from another person eating meals?: None Help from another person taking care of personal grooming?: None Help from another person toileting, which includes using toliet, bedpan, or urinal?: A Little Help from another person bathing (including  washing, rinsing, drying)?: A Little Help from another person to put on and taking off regular upper body clothing?: None Help from another person to put on and taking off regular lower body clothing?: A Little 6 Click Score: 21    End of Session Equipment Utilized During Treatment: Rolling walker  OT Visit Diagnosis: Other abnormalities of gait and mobility (R26.89);Muscle weakness (generalized) (M62.81)   Activity Tolerance Patient tolerated treatment well   Patient Left in bed;with bed alarm set;with call bell/phone within reach;with family/visitor present;with nursing/sitter in room   Nurse Communication Mobility status        Time: 1510-1530 OT Time Calculation (min): 20 min  Charges: OT General Charges $OT Visit: 1 Visit OT Treatments $Self Care/Home Management : 8-22 mins  Belva Agee, OTR/L [place] Pager [phone] Office [place] A Lawana Hartzell 08/31/2018, 5:46 PM

## 2018-09-01 ENCOUNTER — Telehealth: Payer: Self-pay

## 2018-09-01 NOTE — Telephone Encounter (Signed)
Amy- RN with [facility] calling regarding patient. Was recently d/c'd from hospital for Hyperkalemia. Amy draws labs at his visits and wanting to know if we would like to add a BMP. Her call back [phone] Wallace Cullens, RN

## 2018-09-01 NOTE — Telephone Encounter (Signed)
Spoke with Amy with HH. Stated that patient has hospital follow up visit tomorrow and will have labs drawn at that time so no need to draw additional BMP at this time. She has contacted Dr. Serita Grit office with Nephrology to determine if he wants to monitor additional labs.

## 2018-09-02 ENCOUNTER — Encounter: Payer: Self-pay | Admitting: Family Medicine

## 2018-09-02 ENCOUNTER — Other Ambulatory Visit: Payer: Self-pay

## 2018-09-02 ENCOUNTER — Ambulatory Visit (INDEPENDENT_AMBULATORY_CARE_PROVIDER_SITE_OTHER): Payer: Medicare Other | Admitting: Family Medicine

## 2018-09-02 VITALS — BP 150/72 | HR 76 | Temp 98.0°F | Ht 65.0 in | Wt 114.2 lb

## 2018-09-02 DIAGNOSIS — Z09 Encounter for follow-up examination after completed treatment for conditions other than malignant neoplasm: Secondary | ICD-10-CM

## 2018-09-02 DIAGNOSIS — R001 Bradycardia, unspecified: Secondary | ICD-10-CM

## 2018-09-02 DIAGNOSIS — E875 Hyperkalemia: Secondary | ICD-10-CM

## 2018-09-02 DIAGNOSIS — E1143 Type 2 diabetes mellitus with diabetic autonomic (poly)neuropathy: Secondary | ICD-10-CM

## 2018-09-02 MED ORDER — ONETOUCH ULTRA 2 W/DEVICE KIT: 1.0000 | PACK | Freq: Once | 0 refills | Status: AC

## 2018-09-02 NOTE — Assessment & Plan Note (Signed)
Taking Lokelma at home on discharge.  Check BMP today.

## 2018-09-02 NOTE — Assessment & Plan Note (Addendum)
Patient taking Victoza at home prior to admission, tolerating the medication well.  There seems to be some confusion with daughter's understanding as in the hospital patient was receiving insulin multiple times a day instead of once daily.  I explained to them that this may be the case as he was on a sliding scale when admitted, however on discharge would recommend resuming home Victoza dosage.  Advised keeping log of his CBGs and bring to next visit.  They are agreeable to this and expressed good understanding.

## 2018-09-02 NOTE — Progress Notes (Addendum)
   Subjective:   Patient ID: Philip Matthews    DOB: [birth year], 84 y.o. male   MRN: [id number]  CC: hospital follow-up   HPI: Philip Matthews is a 84 y.o. male who presents to clinic today for hospital follow-up.  He and his daughter are deaf, an in-person interpreter is present at visit today for translation.  Hyperkalemia Patient discharged 2 days ago from the hospital.  Admitted on 08/25/2018 for hyperkalemia to K+ 6.5.  Has felt well ever since discharge.  He was prescribed lokelma on discharge and has been taking this at home.     Bradycardia  Denies symptoms of palpitations, lightheadedness, dizziness.  HR 76 today in office.   Constipation Taking Miralax on discharge, was advised he can go back to prn if having bowel movements.  Daughter states this seems better, he has been having regular BMs once moving around more at home   Of note, he has been set up with a [place] RN from advanced home care.    ROS: No fever, chills, nausea, vomiting.  No shortness of breath, chest pain, palpitations or leg swelling.  Social: Patient is a former smoker, quit 2012  Medications reviewed. Objective:   BP (!) 150/72   Pulse 76   Temp 98 F (36.7 C) (Oral)   Ht '5\' 5"'$  (1.651 m)   Wt 114 lb 3.2 oz (51.8 kg)   SpO2 98%   BMI 19.00 kg/m  Vitals and nursing note reviewed.  General: 84 year old male, NAD, deaf- using in-person interpreter HEENT: NCAT, EOMI, PERRL, MMM, oropharynx clear Neck: supple, non-tender, normal ROM, no LAD  CV: RRR no MRG  Lungs: CTAB, normal effort  Abdomen: soft, NTND, no masses or organomegaly, +bs  Skin: warm, dry, no rash Extremities: warm and well perfused, normal tone Neuro: alert, oriented x3, no focal deficits   Assessment & Plan:   Hyperkalemia Taking Lokelma at home on discharge.  Check BMP today.   Bradycardia Appears to have resolved.  HR 76 today in office.  He denies symptoms of palpitations, lightheadedness or dizziness.  No syncopal episodes  reported.  No red flags on exam. -Continue to monitor  Constipation Appears to have resolved, having regular bowel movements ever since he has been up and more ambulatory at home -Recommend MiraLAX prn   Orders Placed This Encounter  Procedures  . Basic metabolic panel   Meds ordered this encounter  Medications  . Blood Glucose Monitoring Suppl (ONE TOUCH ULTRA 2) w/Device KIT    Sig: 1 Device by Does not apply route once for 1 dose.    Dispense:  1 each    Refill:  0   [name], MD [place], PGY-3 09/02/2018 3:49 PM

## 2018-09-02 NOTE — Assessment & Plan Note (Addendum)
Appears to have resolved.  HR 76 today in office.  He denies symptoms of palpitations, lightheadedness or dizziness.  No syncopal episodes reported.  No red flags on exam. -Continue to monitor

## 2018-09-02 NOTE — Patient Instructions (Signed)
Meeting you today.  You are seen in clinic for a hospital follow-up visit and seems to be doing great.  We have ordered some labs to check your potassium level.  Additionally, you can continue to take the [facility] and I will call you once I have the results of your lab work.  You can decrease the Miralax to as needed now that you are having more regular bowel movements.  Please call clinic if you have any questions.  Be well, [name] MD

## 2018-09-03 LAB — BASIC METABOLIC PANEL
BUN/Creatinine Ratio: 37 — ABNORMAL HIGH (ref 10–24)
BUN: 79 mg/dL (ref 8–27)
CALCIUM: 9.1 mg/dL (ref 8.6–10.2)
CO2: 22 mmol/L (ref 20–29)
Chloride: 105 mmol/L (ref 96–106)
Creatinine, Ser: 2.16 mg/dL — ABNORMAL HIGH (ref 0.76–1.27)
GFR calc Af Amer: 31 mL/min/{1.73_m2} — ABNORMAL LOW (ref >59)
GFR, EST NON AFRICAN AMERICAN: 27 mL/min/{1.73_m2} — AB (ref >59)
Glucose: 130 mg/dL — ABNORMAL HIGH (ref 65–99)
POTASSIUM: 5.3 mmol/L — AB (ref 3.5–5.2)
SODIUM: 143 mmol/L (ref 134–144)

## 2018-09-08 DIAGNOSIS — E875 Hyperkalemia: Secondary | ICD-10-CM | POA: Diagnosis not present

## 2018-09-08 DIAGNOSIS — N183 Chronic kidney disease, stage 3 (moderate): Secondary | ICD-10-CM | POA: Diagnosis not present

## 2018-09-08 DIAGNOSIS — D649 Anemia, unspecified: Secondary | ICD-10-CM | POA: Diagnosis not present

## 2018-09-08 DIAGNOSIS — Z5181 Encounter for therapeutic drug level monitoring: Secondary | ICD-10-CM | POA: Diagnosis not present

## 2018-09-09 ENCOUNTER — Ambulatory Visit: Payer: Medicare Other | Admitting: Podiatry

## 2018-09-10 ENCOUNTER — Other Ambulatory Visit: Payer: Self-pay | Admitting: Family Medicine

## 2018-09-14 ENCOUNTER — Ambulatory Visit: Payer: Medicare Other | Admitting: Podiatry

## 2018-09-15 ENCOUNTER — Emergency Department (HOSPITAL_COMMUNITY)
Admission: EM | Admit: 2018-09-15 | Discharge: 2018-09-16 | Disposition: A | Discharge status: Home / Self Care | Payer: Medicare Other | Attending: Emergency Medicine | Admitting: Emergency Medicine

## 2018-09-15 ENCOUNTER — Encounter (HOSPITAL_COMMUNITY): Payer: Self-pay

## 2018-09-15 ENCOUNTER — Telehealth: Payer: Self-pay | Admitting: *Deleted

## 2018-09-15 DIAGNOSIS — R609 Edema, unspecified: Secondary | ICD-10-CM | POA: Insufficient documentation

## 2018-09-15 DIAGNOSIS — H538 Other visual disturbances: Secondary | ICD-10-CM | POA: Insufficient documentation

## 2018-09-15 DIAGNOSIS — N184 Chronic kidney disease, stage 4 (severe): Secondary | ICD-10-CM | POA: Diagnosis not present

## 2018-09-15 DIAGNOSIS — I5032 Chronic diastolic (congestive) heart failure: Secondary | ICD-10-CM | POA: Diagnosis not present

## 2018-09-15 DIAGNOSIS — Z87891 Personal history of nicotine dependence: Secondary | ICD-10-CM | POA: Insufficient documentation

## 2018-09-15 DIAGNOSIS — E1122 Type 2 diabetes mellitus with diabetic chronic kidney disease: Secondary | ICD-10-CM | POA: Diagnosis not present

## 2018-09-15 DIAGNOSIS — E785 Hyperlipidemia, unspecified: Secondary | ICD-10-CM | POA: Diagnosis not present

## 2018-09-15 DIAGNOSIS — M7989 Other specified soft tissue disorders: Secondary | ICD-10-CM | POA: Diagnosis present

## 2018-09-15 DIAGNOSIS — Z86718 Personal history of other venous thrombosis and embolism: Secondary | ICD-10-CM | POA: Diagnosis not present

## 2018-09-15 DIAGNOSIS — I130 Hypertensive heart and chronic kidney disease with heart failure and stage 1 through stage 4 chronic kidney disease, or unspecified chronic kidney disease: Secondary | ICD-10-CM | POA: Diagnosis not present

## 2018-09-15 DIAGNOSIS — Z79899 Other long term (current) drug therapy: Secondary | ICD-10-CM | POA: Insufficient documentation

## 2018-09-15 DIAGNOSIS — H5789 Other specified disorders of eye and adnexa: Secondary | ICD-10-CM | POA: Insufficient documentation

## 2018-09-15 DIAGNOSIS — H5712 Ocular pain, left eye: Secondary | ICD-10-CM | POA: Insufficient documentation

## 2018-09-15 DIAGNOSIS — R600 Localized edema: Secondary | ICD-10-CM | POA: Diagnosis not present

## 2018-09-15 NOTE — Telephone Encounter (Signed)
Pts daughter calls to let us know that his legs are swollen and painful.  She also thinks that his skin is yellow. Pt is not having SOB.  Advised she should take him to ED given his history and the new yellowing of skin.   She is resistant but agreeable to plan. [place], [name], CMA

## 2018-09-15 NOTE — ED Triage Notes (Signed)
Pt is deaf. Pt comes with family states that he has swelling in bilateral lower extremities for an unknown amount of time, maybe two weeks, R swollen more then L, hx of DVT, has been off coumadin for the past six months. Pt has hx of high K level. Pt states that his L eye has been draining yellow drainage for the past two days. Daughter also reports that she feels the pt has been jaundice looking lately. Pt denies CP/SOB

## 2018-09-16 ENCOUNTER — Emergency Department (HOSPITAL_BASED_OUTPATIENT_CLINIC_OR_DEPARTMENT_OTHER): Payer: Medicare Other

## 2018-09-16 DIAGNOSIS — R609 Edema, unspecified: Secondary | ICD-10-CM

## 2018-09-16 LAB — CBC
HEMATOCRIT: 35.8 % — AB (ref 39.0–52.0)
Hemoglobin: 10.4 g/dL — ABNORMAL LOW (ref 13.0–17.0)
MCH: 26.8 pg (ref 26.0–34.0)
MCHC: 29.1 g/dL — ABNORMAL LOW (ref 30.0–36.0)
MCV: 92.3 fL (ref 78.0–100.0)
PLATELETS: 168 10*3/uL (ref 150–400)
RBC: 3.88 MIL/uL — AB (ref 4.22–5.81)
RDW: 17.9 % — ABNORMAL HIGH (ref 11.5–15.5)
WBC: 5.8 10*3/uL (ref 4.0–10.5)

## 2018-09-16 LAB — HEPATIC FUNCTION PANEL
ALT: 28 U/L (ref 0–44)
AST: 35 U/L (ref 15–41)
Albumin: 3.4 g/dL — ABNORMAL LOW (ref 3.5–5.0)
Alkaline Phosphatase: 61 U/L (ref 38–126)
Bilirubin, Direct: 0.1 mg/dL (ref 0.0–0.2)
Indirect Bilirubin: 0.5 mg/dL (ref 0.3–0.9)
TOTAL PROTEIN: 6.9 g/dL (ref 6.5–8.1)
Total Bilirubin: 0.6 mg/dL (ref 0.3–1.2)

## 2018-09-16 LAB — BASIC METABOLIC PANEL
Anion gap: 8 (ref 5–15)
BUN: 43 mg/dL — ABNORMAL HIGH (ref 8–23)
CHLORIDE: 115 mmol/L — AB (ref 98–111)
CO2: 23 mmol/L (ref 22–32)
Calcium: 9 mg/dL (ref 8.9–10.3)
Creatinine, Ser: 2.21 mg/dL — ABNORMAL HIGH (ref 0.61–1.24)
GFR calc Af Amer: 29 mL/min — ABNORMAL LOW (ref >60)
GFR, EST NON AFRICAN AMERICAN: 25 mL/min — AB (ref >60)
GLUCOSE: 117 mg/dL — AB (ref 70–99)
POTASSIUM: 4.7 mmol/L (ref 3.5–5.1)
Sodium: 146 mmol/L — ABNORMAL HIGH (ref 135–145)

## 2018-09-16 LAB — I-STAT TROPONIN, ED: Troponin i, poc: 0 ng/mL (ref 0.00–0.08)

## 2018-09-16 MED ORDER — PROPARACAINE HCL 0.5 % OP SOLN
1.0000 [drp] | Freq: Once | OPHTHALMIC | Status: AC
  Administered 2018-09-16: 1 [drp] via OPHTHALMIC
  Filled 2018-09-16: qty 15

## 2018-09-16 MED ORDER — FUROSEMIDE 20 MG PO TABS: ORAL_TABLET | ORAL | 0 refills | Status: DC

## 2018-09-16 MED ORDER — FLUORESCEIN SODIUM 1 MG OP STRP
1.0000 | ORAL_STRIP | Freq: Once | OPHTHALMIC | Status: AC
  Administered 2018-09-16: 1 via OPHTHALMIC
  Filled 2018-09-16: qty 1

## 2018-09-16 MED ORDER — ERYTHROMYCIN 5 MG/GM OP OINT
1.0000 "application " | TOPICAL_OINTMENT | Freq: Once | OPHTHALMIC | Status: AC
  Administered 2018-09-16: 1 via OPHTHALMIC
  Filled 2018-09-16: qty 3.5

## 2018-09-16 NOTE — ED Notes (Signed)
PA at bedside.

## 2018-09-16 NOTE — ED Provider Notes (Signed)
[facility] EMERGENCY DEPARTMENT Provider Note   CSN: [id number] Arrival date & time: 09/15/18  2312     History   Chief Complaint Chief Complaint  Patient presents with  . Leg Swelling    HPI Philip Matthews is a 84 y.o. male.  Patient with a history of T2DM, CKD, HTN, HLD, DVT presents with daughter who is concerned with symptoms of LE swelling for the past 3 days. She states the right calf is larger than the left, and the swelling on the left affects the ankle and foot. The patient reports the left calf, ankle and foot are painful, the right is not painful. No discoloration. History of LE swelling in the past. History of DVT's that were treated with coumadin until about 6 months ago when his doctor took him off "because they said it wasn't doing anything". Tonight, he denies SOB, chest pain, DOE, lightheadedness, nausea or vomiting. The daughter was also concerned that his skin appeared more yellow than usual, "it just doesn't look normal". She reports he is also having left eye pain with blurred vision for 3 days, like something is in his eye. He describes a foreign body sensation up under the upper eye lid. There has been drainage from the eye that is yellow. No headache or pain with eye movement.   The history is provided by the patient and a relative. A language interpreter was used (Patient and daughter are deaf. Video interpreter used).    Past Medical History:  Diagnosis Date  . Anemia   . Arthritis    "joints" (08/27/2017)  . ARTHRITIS, LEFT SHOULDER 02/01/2010   Qualifier: Diagnosis of  By: Jacques Earthly    . Chronic kidney disease (CKD), stage IV (severe) ([place])   . Deaf   . Diverticulosis   . DIVERTICULOSIS OF COLON 03/08/2010   Qualifier: Diagnosis of  By: Jacques Earthly    . Esophagitis    HSV  . Gout   . History of DVT (deep vein thrombosis)   . Hyperlipidemia   . Hypertension   . Hypothyroidism   . Kidney mass    lipoma  . Liver nodule     u/s 310  . Portal vein thrombosis    on life long coumadin  . PUD, HX OF 03/08/2010   Qualifier: History of  By: Jacques Earthly    . Type II diabetes mellitus [facility])     Patient Active Problem List   Diagnosis Date Noted  . Renal insufficiency   . RTA (renal tubular acidosis)   . Hypertensive urgency   . Syncope 03/20/2018  . Hypoglycemia   . Melena   . GI bleed 08/28/2017  . Bilateral deafness   . Uses visual frame sign language interpreter   . Right thigh pain 07/24/2017  . Neoplasm, uncertain whether benign or malignant 04/15/2017  . Osteoarthritis of left shoulder 04/07/2017  . Health care maintenance 10/23/2016  . Chronic anticoagulation 10/23/2016  . Elevated LFTs 03/18/2016  . Left shoulder pain 09/28/2015  . Bradycardia 12/01/2013  . Hyperkalemia 11/11/2012  . Allergic rhinitis 09/08/2012  . Primary open-angle glaucoma 08/05/2012  . Hypothyroid 06/17/2012  . Bilateral leg edema 05/30/2012  . BARRETTS ESOPHAGUS 07/11/2010  . Mixed hyperlipidemia 06/20/2010  . Portal vein thrombosis 06/08/2010  . Gout 06/07/2010  . LIVER MASS 03/08/2010  . LUNG NODULE 03/08/2010  . Type II diabetes mellitus, controlled but with peripheral neuropathy 02/01/2010  . Anemia in chronic kidney disease 02/01/2010  .  Isolated systolic HTN, goal SBP <109.  02/01/2010  . Chronic kidney disease (CKD), stage III (moderate) ([place]) 02/01/2010    Past Surgical History:  Procedure Laterality Date  . COLON SURGERY     "something was twisted; that messed w/my BM; had to have it untwisted"  . HERNIA REPAIR    . LIVER LOBECTOMY    . SHOULDER ARTHROCENTESIS Left      OB History   None      Home Medications    Prior to Admission medications   Medication Sig Start Date End Date Taking? Authorizing Provider  calcium-vitamin D (OSCAL-500) 500-400 MG-UNIT tablet TAKE 1 TABLET BY MOUTH 2 TIMES DAILY 09/10/18   Caroline More, DO  FEROSUL 325 (65 Fe) MG tablet TAKE 1 TABLET BY MOUTH  EVERY DAY 09/10/18   Caroline More, DO  pantoprazole (PROTONIX) 20 MG tablet TAKE 1 TABLET BY MOUTH 2 TIMES DAILY 09/10/18   Caroline More, DO  vitamin B-12 (CYANOCOBALAMIN) 100 MCG tablet TAKE 1 TABLET BY MOUTH EVERY DAY 09/10/18   Caroline More, DO  acetaminophen (TYLENOL) 500 MG tablet Take 500 mg by mouth every 6 (six) hours as needed (pain).    [provider]  allopurinol (ZYLOPRIM) 100 MG tablet take 1 tablet (100 mg) by mouth once daily 08/18/18   Rory Percy, DO  brimonidine-timolol (COMBIGAN) 0.2-0.5 % ophthalmic solution Place 1 drop into both eyes every 12 (twelve) hours.    [provider]  calcitRIOL (ROCALTROL) 0.25 MCG capsule Take 1 capsule (0.25 mcg total) by mouth every Monday, Wednesday, and Friday. 04/22/18   Mayo, Pete Pelt, MD  docusate sodium (COLACE) 100 MG capsule Take 1 capsule (100 mg total) by mouth 2 (two) times daily as needed (constipation). Over the counter 04/21/18   Mayo, Pete Pelt, MD  epoetin alfa (PROCRIT) 32355 UNIT/ML injection Inject 1 mL (10,000 Units total) into the skin 3 (three) times a week. Patient taking differently: Inject 10,000 Units into the skin every 14 (fourteen) days.  09/24/11   Lyndal Pulley, DO  feeding supplement (BOOST / RESOURCE BREEZE) LIQD Take 1 Container by mouth 3 (three) times daily between meals. Patient not taking: Reported on 08/25/2018 08/29/17   Carlyle Dolly, MD  furosemide (LASIX) 40 MG tablet TAKE 1 TABLET BY MOUTH EVERY DAY 06/17/18   Mayo, Pete Pelt, MD  glucose blood (ONE TOUCH ULTRA TEST) test strip Check blood sugar three times daily 07/30/18   Rory Percy, DO  hydrALAZINE (APRESOLINE) 50 MG tablet take 1 tablet (50mg ) by mouth three times a day 08/18/18   Rory Percy, DO  Lancets [facility] ULTRASOFT) lancets Check sugar up to 6 x daily 07/10/18   Rory Percy, DO  latanoprost (XALATAN) 0.005 % ophthalmic solution Place 1 drop into both eyes at bedtime.    [provider]    levothyroxine (SYNTHROID, LEVOTHROID) 75 MCG tablet Take 1 tablet (75 mcg total) by mouth daily. 08/18/18   Rory Percy, DO  liraglutide (VICTOZA) 18 MG/3ML SOPN Inject 0.1 mLs (0.6 mg total) into the skin daily. 07/21/18 08/25/18  Rory Percy, DO  Multiple Vitamin (GNP ESSENTIAL ONE DAILY) TABS TAKE 1 TABLET BY MOUTH EVERY DAY 06/01/18   Mayo, Pete Pelt, MD  [facility] DELICA LANCETS FINE MISC 1 each by Does not apply route every morning. 03/29/15   Olam Idler, MD  polyethylene glycol powder (GLYCOLAX/MIRALAX) powder Take 17 g by mouth 2 (two) times daily. Hold for loose stools Patient taking differently: Take 17 g  by mouth 2 (two) times daily as needed for mild constipation. Hold for loose stools 04/21/18   Mayo, Pete Pelt, MD  pravastatin (PRAVACHOL) 40 MG tablet take 1 tablet (40 mg) by mouth daily at bedtime 08/18/18   Rory Percy, DO  sodium bicarbonate 650 MG tablet Take 1 tablet (650 mg total) by mouth 2 (two) times daily. 07/16/13   Marin Olp, MD  sodium zirconium cyclosilicate (LOKELMA) 5 g packet Take 5 g by mouth daily. 09/01/18   Wilber Oliphant, MD    Family History Family History  Problem Relation Age of Onset  . Cancer Father        prostate  . Heart disease Mother     Social History Social History   Tobacco Use  . Smoking status: Former Smoker    Types: Cigarettes    Last attempt to quit: 01/18/2011    Years since quitting: 7.6  . Smokeless tobacco: Never Used  . Tobacco comment: 08/27/2017 "didn't smoke alot; can't tell you how long I smoked"  Substance Use Topics  . Alcohol use: No  . Drug use: No     Allergies   Patient has no known allergies.   Review of Systems Review of Systems  Constitutional: Negative for chills and fever.  HENT: Negative.   Eyes: Positive for pain, discharge and visual disturbance.  Respiratory: Negative.  Negative for shortness of breath.   Cardiovascular: Negative.  Negative for chest pain.  Gastrointestinal:  Negative.  Negative for nausea.  Musculoskeletal:       See HPI.  Skin: Negative.  Negative for color change.  Neurological: Negative.  Negative for weakness, numbness and headaches.     Physical Exam Updated Vital Signs BP (!) 188/87 (BP Location: Right Arm)   Pulse 69   Temp 98.6 F (37 C) (Oral)   Resp 14   SpO2 100%   Physical Exam  Constitutional: He is oriented to person, place, and time. He appears well-developed and well-nourished.  HENT:  Head: Normocephalic.  Eyes: Conjunctivae are normal.  Arcus senilis bilaterally. No FB present. No conjunctival swelling or redness. No discharge visualized. Full, pain-free range of extraocular motion. No ptosis. No corneal abrasion on staining. No lid swelling or redness.    Neck: Normal range of motion. Neck supple.  Cardiovascular: Normal rate, regular rhythm and intact distal pulses.  Pulmonary/Chest: Effort normal and breath sounds normal. He has no wheezes. He has no rales. He exhibits no tenderness.  Abdominal: Soft. Bowel sounds are normal. There is no tenderness. There is no rebound and no guarding.  Musculoskeletal: Normal range of motion.  Right lower leg swollen, non-pitting, non-tender. Swelling limited to calf. Left ankle and foot swollen, non-pitting, non-tender. There is no left lower leg swelling but calf is tender to palpation.   Neurological: He is alert and oriented to person, place, and time.  Skin: Skin is warm and dry. No rash noted.  Psychiatric: He has a normal mood and affect.  Nursing note and vitals reviewed.    ED Treatments / Results  Labs (all labs ordered are listed, but only abnormal results are displayed) Labs Reviewed  CBC - Abnormal; Notable for the following components:      Result Value   RBC 3.88 (*)    Hemoglobin 10.4 (*)    HCT 35.8 (*)    MCHC 29.1 (*)    RDW 17.9 (*)    All other components within normal limits  BASIC METABOLIC PANEL - Abnormal;  Notable for the following components:    Sodium 146 (*)    Chloride 115 (*)    Glucose, Bld 117 (*)    BUN 43 (*)    Creatinine, Ser 2.21 (*)    GFR calc non Af Amer 25 (*)    GFR calc Af Amer 29 (*)    All other components within normal limits  HEPATIC FUNCTION PANEL - Abnormal; Notable for the following components:   Albumin 3.4 (*)    All other components within normal limits  I-STAT TROPONIN, ED    EKG None  Radiology No results found.  Procedures Procedures (including critical care time)  Medications Ordered in ED Medications  proparacaine (ALCAINE) 0.5 % ophthalmic solution 1 drop (has no administration in time range)  fluorescein ophthalmic strip 1 strip (has no administration in time range)     Initial Impression / Assessment and Plan / ED Course  I have reviewed the triage vital signs and the nursing notes.  Pertinent labs & imaging results that were available during my care of the patient were reviewed by me and considered in my medical decision making (see chart for details).     Patient here with daughter concerned for LE swelling and pain, left eye pain and blurred vision with drainage, and concern for jaundice.   The patient is very well appearing, in NAD. Appears stated age and well care for.   All discussion performed with video interpreter for ASL as patient and daughter are deaf. He has been examined by Dr. Leonette Monarch.   He will need a doppler study of the LE's given history of DVT, not anticoagulated, and new swelling with pain. He opts to stay in the department until 7:00 to get the study done as opposed to being discharged home and returning later.   Eye exam is unremarkable. No painful movement, headache, nausea or palpable tenderness of globe - doubt glaucoma. They describe discharge and pain like a FB in eye. No FB visualized. Negative fluorescein staining for abrasion. There is no discharge present, however, with description of symptoms and negative exam for abrasion or concern for  increased pressure, will provide erythromycin ointment. He is established with an ophthalmologist and understands the need for recheck in 2 days. This was discussed with the use of interpreter.   Patient care is signed out to [employer], PA-C, pending doppler studies in the morning. Orders placed for bilateral LE dopplers.   Plan: if dopplers are positive, he will need anticoagulation but is considered stable for discharge home. Per pharmacy, he cannot be started on Xarelto secondary to low creatinine clearance. Eliquis was recommended. No dosage adjustment will be required for renal impairment.   If dopplers are negative, the patient shoulder discharged home with an increase to his Lasix. He is currently taking 40 mg q am and he should increase this to 40 mg q am and 20 mg q pm.   Close follow up with PCP for recheck on LE swelling.   Final Clinical Impressions(s) / ED Diagnoses   Final diagnoses:  None   1. Lower extremity swelling 2. Left eye pain  ED Discharge Orders    None       Charlann Lange, PA-C 09/16/18 [id number]    Fatima Blank, MD 09/16/18 [phone]

## 2018-09-16 NOTE — Progress Notes (Signed)
Bilateral lower extremity venous duplex completed - Preliminary results. There is no evidence of a DVT or Baker's cyst. Toma Copier, RVS 09/16/2018 9:15 AM

## 2018-09-16 NOTE — ED Provider Notes (Signed)
Care assumed from Charlann Lange, PA-C at shift change with DVT study b/l pending.  Please see her note for further details.  In brief, this patient is a 84 year old male who presents emergency department today for lower extremity swelling bilaterally for the last 3 days.  Patient was previously on Coumadin for DVT but stopped using approximately 6 months ago.    Lab work was reassuring and at baseline.  Plan for Dopplers of the lower extremities.  PLAN: If DVT studies are positive, start the patient on Eliquis per pharmacy.  If Dopplers are negative, patient will be discharged home with increase of Lasix to 40 mg in the a.m. and 20 mg at night for the next 5 days with PCP follow-up.  Patient denies any chest pain or shortness of breath.  MDM:  DVT studies are negative.  Patient was given prescription for Lasix.  He is to take 40 mg in the a.m. and 20 mg at night for the next 5 days.  Recommended compression stockings and elevation.  Patient is to follow with PCP in the next 5 days.  Return precautions were given.  Patient appears safe for discharge.  1. Peripheral edema      Lorelle Gibbs 09/16/18 1551    Fatima Blank, MD 09/16/18 [id number]

## 2018-09-16 NOTE — ED Notes (Signed)
Patient family given a recliner.

## 2018-09-16 NOTE — Telephone Encounter (Signed)
Amy [facility] Nurse ) nurse to report an issue.    Pts daughter contacted Amy to tell her that the ED increased pts Lasix.  Amy decided to check pts blister packs prepared by [facility] and there was NO lasix.  Amy contacted [facility] and they have not put lasix in his blister packs since June (said they were waiting on an order from [place] - no refill request in chart).  So Amy needs two things addressed:   1. A refill of the 40mg  in AM sent to [place]  2. Our office to call [facility] to let them know weather or not to fill the 20mg   Echo [place], [name], CMA

## 2018-09-16 NOTE — Discharge Instructions (Signed)
Your ultrasound did not show evidence of blood clot Please follow up with your primary care provider this week.  Please increase your lasix to 40 mg in the morning and 20 mg at night for the next 5 days.  If you develop worsening or new concerning symptoms you can return to the emergency department for re-evaluation.

## 2018-09-16 NOTE — Addendum Note (Signed)
Addended by: Christen Bame D on: 09/16/2018 12:18 PM   Modules accepted: Orders

## 2018-09-17 DIAGNOSIS — H401113 Primary open-angle glaucoma, right eye, severe stage: Secondary | ICD-10-CM | POA: Diagnosis not present

## 2018-09-17 DIAGNOSIS — H401121 Primary open-angle glaucoma, left eye, mild stage: Secondary | ICD-10-CM | POA: Diagnosis not present

## 2018-09-17 MED ORDER — FUROSEMIDE 20 MG PO TABS: ORAL_TABLET | ORAL | 0 refills | Status: AC

## 2018-09-17 NOTE — Addendum Note (Signed)
Addended by: Myles Gip on: 09/17/2018 02:53 PM   Modules accepted: Orders

## 2018-09-17 NOTE — Telephone Encounter (Signed)
Amy with [facility] notified.

## 2018-09-17 NOTE — Telephone Encounter (Signed)
Refilled Lasix order per ED visit. Spoke with [facility] and was informed that they will place Lasix in separate bottle for pick up as blister pack has already been made.   Rory Percy, DO PGY-2, [facility] 09/17/2018 2:53 PM

## 2018-09-23 DIAGNOSIS — H401121 Primary open-angle glaucoma, left eye, mild stage: Secondary | ICD-10-CM | POA: Diagnosis not present

## 2018-09-23 DIAGNOSIS — H401113 Primary open-angle glaucoma, right eye, severe stage: Secondary | ICD-10-CM | POA: Diagnosis not present

## 2018-09-24 ENCOUNTER — Encounter (HOSPITAL_COMMUNITY): Payer: Self-pay

## 2018-09-24 ENCOUNTER — Other Ambulatory Visit: Payer: Self-pay

## 2018-09-24 ENCOUNTER — Emergency Department (HOSPITAL_COMMUNITY): Payer: Medicare Other

## 2018-09-24 ENCOUNTER — Inpatient Hospital Stay (HOSPITAL_COMMUNITY)
Admission: EM | Admit: 2018-09-24 | Discharge: 2018-10-30 | DRG: 326 | Disposition: E | Payer: Medicare Other | Attending: Pulmonary Disease | Admitting: Pulmonary Disease

## 2018-09-24 ENCOUNTER — Ambulatory Visit (INDEPENDENT_AMBULATORY_CARE_PROVIDER_SITE_OTHER)
Admission: EM | Admit: 2018-09-24 | Discharge: 2018-09-24 | Disposition: A | Discharge status: Home / Self Care | Payer: Medicare Other | Source: Home / Self Care | Attending: Family Medicine | Admitting: Family Medicine

## 2018-09-24 ENCOUNTER — Encounter (HOSPITAL_COMMUNITY): Payer: Self-pay | Admitting: *Deleted

## 2018-09-24 ENCOUNTER — Inpatient Hospital Stay (HOSPITAL_COMMUNITY): Payer: Medicare Other

## 2018-09-24 ENCOUNTER — Ambulatory Visit (INDEPENDENT_AMBULATORY_CARE_PROVIDER_SITE_OTHER): Payer: Medicare Other

## 2018-09-24 DIAGNOSIS — K5651 Intestinal adhesions [bands], with partial obstruction: Principal | ICD-10-CM | POA: Diagnosis present

## 2018-09-24 DIAGNOSIS — A419 Sepsis, unspecified organism: Secondary | ICD-10-CM | POA: Diagnosis not present

## 2018-09-24 DIAGNOSIS — N184 Chronic kidney disease, stage 4 (severe): Secondary | ICD-10-CM | POA: Diagnosis present

## 2018-09-24 DIAGNOSIS — J9601 Acute respiratory failure with hypoxia: Secondary | ICD-10-CM | POA: Diagnosis present

## 2018-09-24 DIAGNOSIS — H913 Deaf nonspeaking, not elsewhere classified: Secondary | ICD-10-CM | POA: Diagnosis present

## 2018-09-24 DIAGNOSIS — N390 Urinary tract infection, site not specified: Secondary | ICD-10-CM | POA: Diagnosis present

## 2018-09-24 DIAGNOSIS — E039 Hypothyroidism, unspecified: Secondary | ICD-10-CM | POA: Diagnosis present

## 2018-09-24 DIAGNOSIS — H401123 Primary open-angle glaucoma, left eye, severe stage: Secondary | ICD-10-CM | POA: Diagnosis present

## 2018-09-24 DIAGNOSIS — K56609 Unspecified intestinal obstruction, unspecified as to partial versus complete obstruction: Secondary | ICD-10-CM

## 2018-09-24 DIAGNOSIS — E872 Acidosis: Secondary | ICD-10-CM | POA: Diagnosis not present

## 2018-09-24 DIAGNOSIS — Y92234 Operating room of hospital as the place of occurrence of the external cause: Secondary | ICD-10-CM | POA: Diagnosis not present

## 2018-09-24 DIAGNOSIS — K5669 Other partial intestinal obstruction: Secondary | ICD-10-CM | POA: Diagnosis not present

## 2018-09-24 DIAGNOSIS — Z0189 Encounter for other specified special examinations: Secondary | ICD-10-CM

## 2018-09-24 DIAGNOSIS — Y838 Other surgical procedures as the cause of abnormal reaction of the patient, or of later complication, without mention of misadventure at the time of the procedure: Secondary | ICD-10-CM | POA: Diagnosis not present

## 2018-09-24 DIAGNOSIS — K9171 Accidental puncture and laceration of a digestive system organ or structure during a digestive system procedure: Secondary | ICD-10-CM | POA: Diagnosis not present

## 2018-09-24 DIAGNOSIS — H9193 Unspecified hearing loss, bilateral: Secondary | ICD-10-CM | POA: Diagnosis present

## 2018-09-24 DIAGNOSIS — N189 Chronic kidney disease, unspecified: Secondary | ICD-10-CM | POA: Diagnosis present

## 2018-09-24 DIAGNOSIS — E1122 Type 2 diabetes mellitus with diabetic chronic kidney disease: Secondary | ICD-10-CM | POA: Diagnosis present

## 2018-09-24 DIAGNOSIS — R1084 Generalized abdominal pain: Secondary | ICD-10-CM

## 2018-09-24 DIAGNOSIS — Z87891 Personal history of nicotine dependence: Secondary | ICD-10-CM

## 2018-09-24 DIAGNOSIS — Z66 Do not resuscitate: Secondary | ICD-10-CM | POA: Diagnosis not present

## 2018-09-24 DIAGNOSIS — K668 Other specified disorders of peritoneum: Secondary | ICD-10-CM | POA: Diagnosis not present

## 2018-09-24 DIAGNOSIS — K56699 Other intestinal obstruction unspecified as to partial versus complete obstruction: Secondary | ICD-10-CM | POA: Diagnosis not present

## 2018-09-24 DIAGNOSIS — E1143 Type 2 diabetes mellitus with diabetic autonomic (poly)neuropathy: Secondary | ICD-10-CM | POA: Diagnosis not present

## 2018-09-24 DIAGNOSIS — E1142 Type 2 diabetes mellitus with diabetic polyneuropathy: Secondary | ICD-10-CM | POA: Diagnosis present

## 2018-09-24 DIAGNOSIS — Z86718 Personal history of other venous thrombosis and embolism: Secondary | ICD-10-CM

## 2018-09-24 DIAGNOSIS — D631 Anemia in chronic kidney disease: Secondary | ICD-10-CM | POA: Diagnosis present

## 2018-09-24 DIAGNOSIS — Z515 Encounter for palliative care: Secondary | ICD-10-CM | POA: Diagnosis not present

## 2018-09-24 DIAGNOSIS — K5900 Constipation, unspecified: Secondary | ICD-10-CM | POA: Diagnosis not present

## 2018-09-24 DIAGNOSIS — R140 Abdominal distension (gaseous): Secondary | ICD-10-CM | POA: Diagnosis not present

## 2018-09-24 DIAGNOSIS — Z6820 Body mass index (BMI) 20.0-20.9, adult: Secondary | ICD-10-CM

## 2018-09-24 DIAGNOSIS — R64 Cachexia: Secondary | ICD-10-CM | POA: Diagnosis present

## 2018-09-24 DIAGNOSIS — R6521 Severe sepsis with septic shock: Secondary | ICD-10-CM | POA: Diagnosis not present

## 2018-09-24 DIAGNOSIS — K660 Peritoneal adhesions (postprocedural) (postinfection): Secondary | ICD-10-CM | POA: Diagnosis not present

## 2018-09-24 DIAGNOSIS — D696 Thrombocytopenia, unspecified: Secondary | ICD-10-CM | POA: Diagnosis not present

## 2018-09-24 DIAGNOSIS — E440 Moderate protein-calorie malnutrition: Secondary | ICD-10-CM

## 2018-09-24 DIAGNOSIS — K219 Gastro-esophageal reflux disease without esophagitis: Secondary | ICD-10-CM | POA: Diagnosis present

## 2018-09-24 DIAGNOSIS — N183 Chronic kidney disease, stage 3 (moderate): Secondary | ICD-10-CM | POA: Diagnosis not present

## 2018-09-24 DIAGNOSIS — K449 Diaphragmatic hernia without obstruction or gangrene: Secondary | ICD-10-CM | POA: Diagnosis present

## 2018-09-24 DIAGNOSIS — M1A9XX0 Chronic gout, unspecified, without tophus (tophi): Secondary | ICD-10-CM | POA: Diagnosis present

## 2018-09-24 DIAGNOSIS — H401111 Primary open-angle glaucoma, right eye, mild stage: Secondary | ICD-10-CM | POA: Diagnosis present

## 2018-09-24 DIAGNOSIS — I129 Hypertensive chronic kidney disease with stage 1 through stage 4 chronic kidney disease, or unspecified chronic kidney disease: Secondary | ICD-10-CM | POA: Diagnosis present

## 2018-09-24 DIAGNOSIS — I10 Essential (primary) hypertension: Secondary | ICD-10-CM | POA: Diagnosis not present

## 2018-09-24 DIAGNOSIS — K565 Intestinal adhesions [bands], unspecified as to partial versus complete obstruction: Secondary | ICD-10-CM | POA: Diagnosis not present

## 2018-09-24 DIAGNOSIS — Z8711 Personal history of peptic ulcer disease: Secondary | ICD-10-CM

## 2018-09-24 DIAGNOSIS — K922 Gastrointestinal hemorrhage, unspecified: Secondary | ICD-10-CM | POA: Diagnosis not present

## 2018-09-24 DIAGNOSIS — N2589 Other disorders resulting from impaired renal tubular function: Secondary | ICD-10-CM | POA: Diagnosis present

## 2018-09-24 DIAGNOSIS — S31109A Unspecified open wound of abdominal wall, unspecified quadrant without penetration into peritoneal cavity, initial encounter: Secondary | ICD-10-CM | POA: Diagnosis not present

## 2018-09-24 DIAGNOSIS — E119 Type 2 diabetes mellitus without complications: Secondary | ICD-10-CM | POA: Diagnosis not present

## 2018-09-24 DIAGNOSIS — Z8249 Family history of ischemic heart disease and other diseases of the circulatory system: Secondary | ICD-10-CM

## 2018-09-24 LAB — COMPREHENSIVE METABOLIC PANEL
ALBUMIN: 3.7 g/dL (ref 3.5–5.0)
ALT: 22 U/L (ref 0–44)
AST: 43 U/L — ABNORMAL HIGH (ref 15–41)
Alkaline Phosphatase: 62 U/L (ref 38–126)
Anion gap: 14 (ref 5–15)
BUN: 72 mg/dL — AB (ref 8–23)
CHLORIDE: 99 mmol/L (ref 98–111)
CO2: 26 mmol/L (ref 22–32)
CREATININE: 2.25 mg/dL — AB (ref 0.61–1.24)
Calcium: 8.7 mg/dL — ABNORMAL LOW (ref 8.9–10.3)
GFR calc Af Amer: 28 mL/min — ABNORMAL LOW (ref >60)
GFR calc non Af Amer: 25 mL/min — ABNORMAL LOW (ref >60)
GLUCOSE: 215 mg/dL — AB (ref 70–99)
POTASSIUM: 5 mmol/L (ref 3.5–5.1)
SODIUM: 139 mmol/L (ref 135–145)
Total Bilirubin: 0.9 mg/dL (ref 0.3–1.2)
Total Protein: 7 g/dL (ref 6.5–8.1)

## 2018-09-24 LAB — CBC
HEMATOCRIT: 45 % (ref 39.0–52.0)
Hemoglobin: 13.5 g/dL (ref 13.0–17.0)
MCH: 27.2 pg (ref 26.0–34.0)
MCHC: 30 g/dL (ref 30.0–36.0)
MCV: 90.7 fL (ref 78.0–100.0)
Platelets: 159 10*3/uL (ref 150–400)
RBC: 4.96 MIL/uL (ref 4.22–5.81)
RDW: 17.4 % — AB (ref 11.5–15.5)
WBC: 10.6 10*3/uL — ABNORMAL HIGH (ref 4.0–10.5)

## 2018-09-24 LAB — LIPASE, BLOOD: Lipase: 35 U/L (ref 11–51)

## 2018-09-24 MED ORDER — ONDANSETRON HCL 4 MG/2ML IJ SOLN
4.0000 mg | Freq: Once | INTRAMUSCULAR | Status: AC
  Administered 2018-09-24: 4 mg via INTRAVENOUS
  Filled 2018-09-24: qty 2

## 2018-09-24 MED ORDER — MORPHINE SULFATE (PF) 4 MG/ML IV SOLN
4.0000 mg | Freq: Once | INTRAVENOUS | Status: AC
  Administered 2018-09-24: 4 mg via INTRAVENOUS
  Filled 2018-09-24: qty 1

## 2018-09-24 MED ORDER — INSULIN ASPART 100 UNIT/ML ~~LOC~~ SOLN
0.0000 [IU] | SUBCUTANEOUS | Status: DC
  Administered 2018-09-25 (×3): 1 [IU] via SUBCUTANEOUS
  Administered 2018-09-29: 2 [IU] via SUBCUTANEOUS
  Administered 2018-09-29: 1 [IU] via SUBCUTANEOUS
  Administered 2018-09-30: 2 [IU] via SUBCUTANEOUS

## 2018-09-24 MED ORDER — DIATRIZOATE MEGLUMINE & SODIUM 66-10 % PO SOLN
90.0000 mL | Freq: Once | ORAL | Status: DC
  Filled 2018-09-24: qty 90

## 2018-09-24 MED ORDER — ENOXAPARIN SODIUM 30 MG/0.3ML ~~LOC~~ SOLN
30.0000 mg | SUBCUTANEOUS | Status: DC
  Administered 2018-09-25 – 2018-09-27 (×3): 30 mg via SUBCUTANEOUS
  Filled 2018-09-24 (×3): qty 0.3

## 2018-09-24 NOTE — Discharge Instructions (Addendum)
Appears to be an obstruction in your bowel.  For this reason you need to be seen in the emergency room.

## 2018-09-24 NOTE — H&P (Signed)
CC/Reason for consult: Small bowel obstruction by ED Provider - abdominal bloating  HPI: Philip Matthews is an 84 y.o. male with hx of DVT, HTN, HLD, hypothyroidism, CKD (baseline creatinine 2.2) whom presented to the ED with 3d of constipation and distention. Never had this before. Did have some discomfort with all of this but has been better today. He denies flatus/BM since 3d ago. Currently denies abdominal pain; does some nausea but no emesis. Denies f/c.   Past Medical History:  Diagnosis Date  . Anemia   . Arthritis    "joints" (08/27/2017)  . ARTHRITIS, LEFT SHOULDER 02/01/2010   Qualifier: Diagnosis of  By: Jacques Earthly    . Chronic kidney disease (CKD), stage IV (severe) ([place])   . Deaf   . Diverticulosis   . DIVERTICULOSIS OF COLON 03/08/2010   Qualifier: Diagnosis of  By: Jacques Earthly    . Esophagitis    HSV  . Gout   . History of DVT (deep vein thrombosis)   . Hyperlipidemia   . Hypertension   . Hypothyroidism   . Kidney mass    lipoma  . Liver nodule    u/s 310  . Portal vein thrombosis    on life long coumadin  . PUD, HX OF 03/08/2010   Qualifier: History of  By: Jacques Earthly    . Type II diabetes mellitus ([place])     Past Surgical History:  Procedure Laterality Date  . COLON SURGERY     "something was twisted; that messed w/my BM; had to have it untwisted"  . HERNIA REPAIR    . LIVER LOBECTOMY    . SHOULDER ARTHROCENTESIS Left   Remote surgery in the 1960s-1970s, unclear what happened or why it was done - believes there was something twisted.  Family History  Problem Relation Age of Onset  . Cancer Father        prostate  . Heart disease Mother     Social:  reports that he quit smoking about 7 years ago. His smoking use included cigarettes. He has never used smokeless tobacco. He reports that he does not drink alcohol or use drugs.  Allergies: No Known Allergies  Medications: I have reviewed the patient's current medications.  Results  for orders placed or performed during the hospital encounter of 08/30/2018 (from the past 48 hour(s))  Lipase, blood     Status: None   Collection Time: 09/26/2018 11:31 AM  Result Value Ref Range   Lipase 35 11 - 51 U/L    Comment: Performed at [facility], [address] [place], [place] [address]  Comprehensive metabolic panel     Status: Abnormal   Collection Time: 09/08/2018 11:31 AM  Result Value Ref Range   Sodium 139 135 - 145 mmol/L   Potassium 5.0 3.5 - 5.1 mmol/L   Chloride 99 98 - 111 mmol/L   CO2 26 22 - 32 mmol/L   Glucose, Bld 215 (H) 70 - 99 mg/dL   BUN 72 (H) 8 - 23 mg/dL   Creatinine, Ser 2.25 (H) 0.61 - 1.24 mg/dL   Calcium 8.7 (L) 8.9 - 10.3 mg/dL   Total Protein 7.0 6.5 - 8.1 g/dL   Albumin 3.7 3.5 - 5.0 g/dL   AST 43 (H) 15 - 41 U/L   ALT 22 0 - 44 U/L   Alkaline Phosphatase 62 38 - 126 U/L   Total Bilirubin 0.9 0.3 - 1.2 mg/dL   GFR calc non  Af Amer 25 (L) >60 mL/min   GFR calc Af Amer 28 (L) >60 mL/min    Comment: (NOTE) The eGFR has been calculated using the CKD EPI equation. This calculation has not been validated in all clinical situations. eGFR's persistently <60 mL/min signify possible Chronic Kidney Disease.    Anion gap 14 5 - 15    Comment: Performed at [facility] [address], [place], [place] [address]  CBC     Status: Abnormal   Collection Time: 09/18/2018 11:31 AM  Result Value Ref Range   WBC 10.6 (H) 4.0 - 10.5 K/uL   RBC 4.96 4.22 - 5.81 MIL/uL   Hemoglobin 13.5 13.0 - 17.0 g/dL   HCT 45.0 39.0 - 52.0 %   MCV 90.7 78.0 - 100.0 fL   MCH 27.2 26.0 - 34.0 pg   MCHC 30.0 30.0 - 36.0 g/dL   RDW 17.4 (H) 11.5 - 15.5 %   Platelets 159 150 - 400 K/uL    Comment: Performed at [facility], [place] [address], [place], [place] [address]    Ct Abdomen Pelvis Wo Contrast  Result Date: 09/23/2018 CLINICAL DATA:  Constipation for the past 3 days. EXAM: CT ABDOMEN AND PELVIS WITHOUT CONTRAST TECHNIQUE: Multidetector CT imaging of  the abdomen and pelvis was performed following the standard protocol without IV contrast. COMPARISON:  Abdomen pelvis radiographs obtained earlier today. Abdomen ultrasound dated 04/01/2011. FINDINGS: Lower chest: Large hiatal hernia. Small bilateral pleural effusions and associated mild bibasilar atelectasis. Atheromatous coronary artery calcifications. Hepatobiliary: No focal liver abnormality is seen. No gallstones, gallbladder wall thickening, or biliary dilatation. Pancreas: Unremarkable. No pancreatic ductal dilatation or surrounding inflammatory changes. Spleen: Normal in size without focal abnormality. Adrenals/Urinary Tract: Both kidneys are small. Normal appearing adrenal glands, urinary bladder and ureters. Stomach/Bowel: Normal caliber colon containing multiple diverticula. Diffusely dilated small bowel and stomach. Vascular/Lymphatic: Right upper quadrant vascular coils. Extensive atheromatous arterial calcifications. No enlarged lymph nodes. Reproductive: Mildly enlarged prostate gland. Other: Anterior abdominal wall surgical sutures. Small amount of free peritoneal fluid. Small left inguinal hernia containing fat. Musculoskeletal: Diffuse osteopenia. Lumbar and lower thoracic spine degenerative changes. IMPRESSION: 1. Distal small bowel obstruction. The cause of obstruction is not identified, most likely due to an adhesion. 2. Large hiatal hernia. 3. Small bilateral pleural effusions and associated mild bibasilar atelectasis. 4. Extensive atheromatous arterial calcifications, including the coronary arteries. 5. Mild prostatic hypertrophy. 6. Scattered colonic diverticula. Electronically Signed   By: Claudie Revering M.D.   On: 09/12/2018 17:08   Dg Abd 2 Views  Result Date: 09/09/2018 CLINICAL DATA:  Left-sided abdominal pain, constipation. EXAM: ABDOMEN - 2 VIEW COMPARISON:  Radiographs of March 20, 2018. FINDINGS: Dilated small bowel loops are seen in the left side of the abdomen. Moderate amount of  stool is noted in the colon. Surgical clips are seen in the left upper quadrant. Vascular calcifications are noted. IMPRESSION: Dilated small bowel loops are seen in the left side of the abdomen concerning for distal small bowel obstruction. Continued radiographic follow-up is recommended. Moderate stool burden is noted. Electronically Signed   By: Marijo Conception, M.D.   On: 09/25/2018 10:42    ROS - all of the below systems have been reviewed with the patient and positives are indicated with bold text General: chills, fever or night sweats Eyes: blurry vision or double vision ENT: epistaxis or sore throat Allergy/Immunology: itchy/watery eyes or nasal congestion Hematologic/Lymphatic: bleeding problems, blood clots or swollen lymph  nodes Endocrine: temperature intolerance or unexpected weight changes Breast: new or changing breast lumps or nipple discharge Resp: cough, shortness of breath, or wheezing CV: chest pain or dyspnea on exertion GI: as per HPI GU: dysuria, trouble voiding, or hematuria MSK: joint pain or joint stiffness Neuro: [name] or stroke symptoms Derm: pruritus and skin lesion changes Psych: anxiety and depression  PE Blood pressure 126/83, pulse 97, temperature (!) 96.8 F (36 C), temperature source Axillary, resp. rate 16, SpO2 93 %. Constitutional: NAD; conversant with interpreter (sign language as patient is deaf); no deformities Eyes: Moist conjunctiva; no lid lag; anicteric; PERRL Neck: Trachea midline; no thyromegaly Lungs: Normal respiratory effort; no tactile fremitus CV: RRR; no palpable thrills; no pitting edema GI: Abd soft, not significantly tender to palpation; no rebound/guarding; moderately distended; no palpable hepatosplenomegaly MSK: Normal gait; no clubbing/cyanosis Psychiatric: Appropriate affect; alert and oriented x3 Lymphatic: No palpable cervical or axillary lymphadenopathy  Results for orders placed or performed during the hospital encounter  of 09/14/2018 (from the past 48 hour(s))  Lipase, blood     Status: None   Collection Time: 08/31/2018 11:31 AM  Result Value Ref Range   Lipase 35 11 - 51 U/L    Comment: Performed at [facility], [place] [address] [place], [place] [address]  Comprehensive metabolic panel     Status: Abnormal   Collection Time: 08/30/2018 11:31 AM  Result Value Ref Range   Sodium 139 135 - 145 mmol/L   Potassium 5.0 3.5 - 5.1 mmol/L   Chloride 99 98 - 111 mmol/L   CO2 26 22 - 32 mmol/L   Glucose, Bld 215 (H) 70 - 99 mg/dL   BUN 72 (H) 8 - 23 mg/dL   Creatinine, Ser 2.25 (H) 0.61 - 1.24 mg/dL   Calcium 8.7 (L) 8.9 - 10.3 mg/dL   Total Protein 7.0 6.5 - 8.1 g/dL   Albumin 3.7 3.5 - 5.0 g/dL   AST 43 (H) 15 - 41 U/L   ALT 22 0 - 44 U/L   Alkaline Phosphatase 62 38 - 126 U/L   Total Bilirubin 0.9 0.3 - 1.2 mg/dL   GFR calc non Af Amer 25 (L) >60 mL/min   GFR calc Af Amer 28 (L) >60 mL/min    Comment: (NOTE) The eGFR has been calculated using the CKD EPI equation. This calculation has not been validated in all clinical situations. eGFR's persistently <60 mL/min signify possible Chronic Kidney Disease.    Anion gap 14 5 - 15    Comment: Performed at [facility] [address], [place], [place] [address]  CBC     Status: Abnormal   Collection Time: 09/13/2018 11:31 AM  Result Value Ref Range   WBC 10.6 (H) 4.0 - 10.5 K/uL   RBC 4.96 4.22 - 5.81 MIL/uL   Hemoglobin 13.5 13.0 - 17.0 g/dL   HCT 45.0 39.0 - 52.0 %   MCV 90.7 78.0 - 100.0 fL   MCH 27.2 26.0 - 34.0 pg   MCHC 30.0 30.0 - 36.0 g/dL   RDW 17.4 (H) 11.5 - 15.5 %   Platelets 159 150 - 400 K/uL    Comment: Performed at [facility], [place] [address] [place], [place] [address]    Ct Abdomen Pelvis Wo Contrast  Result Date: 09/14/2018 CLINICAL DATA:  Constipation for the past 3 days. EXAM: CT ABDOMEN AND PELVIS WITHOUT CONTRAST TECHNIQUE: Multidetector CT imaging of the abdomen and pelvis was performed following  the standard  protocol without IV contrast. COMPARISON:  Abdomen pelvis radiographs obtained earlier today. Abdomen ultrasound dated 04/01/2011. FINDINGS: Lower chest: Large hiatal hernia. Small bilateral pleural effusions and associated mild bibasilar atelectasis. Atheromatous coronary artery calcifications. Hepatobiliary: No focal liver abnormality is seen. No gallstones, gallbladder wall thickening, or biliary dilatation. Pancreas: Unremarkable. No pancreatic ductal dilatation or surrounding inflammatory changes. Spleen: Normal in size without focal abnormality. Adrenals/Urinary Tract: Both kidneys are small. Normal appearing adrenal glands, urinary bladder and ureters. Stomach/Bowel: Normal caliber colon containing multiple diverticula. Diffusely dilated small bowel and stomach. Vascular/Lymphatic: Right upper quadrant vascular coils. Extensive atheromatous arterial calcifications. No enlarged lymph nodes. Reproductive: Mildly enlarged prostate gland. Other: Anterior abdominal wall surgical sutures. Small amount of free peritoneal fluid. Small left inguinal hernia containing fat. Musculoskeletal: Diffuse osteopenia. Lumbar and lower thoracic spine degenerative changes. IMPRESSION: 1. Distal small bowel obstruction. The cause of obstruction is not identified, most likely due to an adhesion. 2. Large hiatal hernia. 3. Small bilateral pleural effusions and associated mild bibasilar atelectasis. 4. Extensive atheromatous arterial calcifications, including the coronary arteries. 5. Mild prostatic hypertrophy. 6. Scattered colonic diverticula. Electronically Signed   By: Claudie Revering M.D.   On: 09/19/2018 17:08   Dg Abd 2 Views  Result Date: 09/06/2018 CLINICAL DATA:  Left-sided abdominal pain, constipation. EXAM: ABDOMEN - 2 VIEW COMPARISON:  Radiographs of March 20, 2018. FINDINGS: Dilated small bowel loops are seen in the left side of the abdomen. Moderate amount of stool is noted in the colon. Surgical clips are seen in the  left upper quadrant. Vascular calcifications are noted. IMPRESSION: Dilated small bowel loops are seen in the left side of the abdomen concerning for distal small bowel obstruction. Continued radiographic follow-up is recommended. Moderate stool burden is noted. Electronically Signed   By: Marijo Conception, M.D.   On: 09/21/2018 10:42   A/P: Philip Matthews is an 84 y.o. male with hx of HTN, HLD, hypothyroidism, CKD here with [facility]BO  -Potentially adhesive in etiology; reassuring exam -Agree with medicine admission given comorbidities -SBO protocol (NGT, gastrograffin, serial XRs) - orders placed -We will continue to follow with you -Ok for chemical and mechanical dvt prophylaxis  Sharon Mt. Dema Severin, M.D. [facility]

## 2018-09-24 NOTE — ED Triage Notes (Signed)
Pt in c/o constipation for 3 days, pt had xray there and was told to come here for r/o blockage, pt denies n/v, pt A&O x4

## 2018-09-24 NOTE — ED Provider Notes (Signed)
[facility] EMERGENCY DEPARTMENT Provider Note   CSN: [id number] Arrival date & time: 09/10/2018  1057     History   Chief Complaint Chief Complaint  Patient presents with  . Abdominal Pain    HPI Philip Matthews is a 84 y.o. male.  HPI   84 year old male with hearing impairment, CKD stage IV, diabetes, presents with concern for abdominal pain.  Reports left-sided abdominal pain, currently 5 out of 10.  Reports cramping and bloating.  Reports nausea but no vomiting.  Had a very small bowel movement yesterday, however has not passed any flatus today.  No fevers or chills, no urinary symptoms, no chest pain or shortness of breath.  Sent from urgent care with concern for SBO on XR  ASL interpreter used. Past Medical History:  Diagnosis Date  . Anemia   . Arthritis    "joints" (08/27/2017)  . ARTHRITIS, LEFT SHOULDER 02/01/2010   Qualifier: Diagnosis of  By: Jacques Earthly    . Chronic kidney disease (CKD), stage IV (severe) ([place])   . Deaf   . Diverticulosis   . DIVERTICULOSIS OF COLON 03/08/2010   Qualifier: Diagnosis of  By: Jacques Earthly    . Esophagitis    HSV  . Gout   . History of DVT (deep vein thrombosis)   . Hyperlipidemia   . Hypertension   . Hypothyroidism   . Kidney mass    lipoma  . Liver nodule    u/s 310  . Portal vein thrombosis    on life long coumadin  . PUD, HX OF 03/08/2010   Qualifier: History of  By: Jacques Earthly    . Type II diabetes mellitus [facility])     Patient Active Problem List   Diagnosis Date Noted  . SBO (small bowel obstruction) ([place]) 09/17/2018  . Renal insufficiency   . RTA (renal tubular acidosis)   . Hypertensive urgency   . Syncope 03/20/2018  . Hypoglycemia   . Melena   . GI bleed 08/28/2017  . Bilateral deafness   . Uses visual frame sign language interpreter   . Right thigh pain 07/24/2017  . Neoplasm, uncertain whether benign or malignant 04/15/2017  . Osteoarthritis of left shoulder  04/07/2017  . Health care maintenance 10/23/2016  . Chronic anticoagulation 10/23/2016  . Elevated LFTs 03/18/2016  . Left shoulder pain 09/28/2015  . Bradycardia 12/01/2013  . Hyperkalemia 11/11/2012  . Allergic rhinitis 09/08/2012  . Primary open-angle glaucoma 08/05/2012  . Hypothyroid 06/17/2012  . Bilateral leg edema 05/30/2012  . BARRETTS ESOPHAGUS 07/11/2010  . Mixed hyperlipidemia 06/20/2010  . Portal vein thrombosis 06/08/2010  . Gout 06/07/2010  . LIVER MASS 03/08/2010  . LUNG NODULE 03/08/2010  . Type II diabetes mellitus, controlled but with peripheral neuropathy 02/01/2010  . Anemia in chronic kidney disease 02/01/2010  . Isolated systolic HTN, goal SBP <637.  02/01/2010  . Chronic kidney disease (CKD), stage III (moderate) ([employer]) 02/01/2010    Past Surgical History:  Procedure Laterality Date  . COLON SURGERY     "something was twisted; that messed w/my BM; had to have it untwisted"  . HERNIA REPAIR    . LIVER LOBECTOMY    . SHOULDER ARTHROCENTESIS Left      OB History   None      Home Medications    Prior to Admission medications   Medication Sig Start Date End Date Taking? Authorizing Provider  acetaminophen (TYLENOL) 500 MG tablet Take 500 mg by  mouth every 6 (six) hours as needed (pain).   Yes [provider]  allopurinol (ZYLOPRIM) 100 MG tablet take 1 tablet (100 mg) by mouth once daily 08/18/18  Yes Rumball, Alison, DO  brimonidine-timolol (COMBIGAN) 0.2-0.5 % ophthalmic solution Place 1 drop into both eyes every 12 (twelve) hours.   Yes [provider]  calcitRIOL (ROCALTROL) 0.25 MCG capsule Take 1 capsule (0.25 mcg total) by mouth every Monday, Wednesday, and Friday. 04/22/18  Yes Mayo, Pete Pelt, MD  calcium-vitamin D (OSCAL-500) 500-400 MG-UNIT tablet TAKE 1 TABLET BY MOUTH 2 TIMES DAILY Patient taking differently: Take 1 tablet by mouth 2 (two) times daily.  09/10/18  Yes Tammi Klippel, Sherin, DO  docusate sodium (COLACE) 100 MG  capsule Take 1 capsule (100 mg total) by mouth 2 (two) times daily as needed (constipation). Over the counter 04/21/18  Yes Mayo, Pete Pelt, MD  epoetin alfa (PROCRIT) 78295 UNIT/ML injection Inject 1 mL (10,000 Units total) into the skin 3 (three) times a week. Patient taking differently: Inject 10,000 Units into the skin every 14 (fourteen) days.  09/24/11  Yes Hulan Saas M, DO  FEROSUL 325 (65 Fe) MG tablet TAKE 1 TABLET BY MOUTH EVERY DAY Patient taking differently: Take 325 mg by mouth daily.  09/10/18  Yes Tammi Klippel, Sherin, DO  furosemide (LASIX) 20 MG tablet Please take 40mg  (2 tablets) in the morning and 20mg  at bedtime (1 tablet) for the next 5 days. Patient taking differently: Take 20-40 mg by mouth See admin instructions. Take 40mg  (2 tablets) in the morning and 20mg  (1 tablet) at bedtime. 09/17/18  Yes Rory Percy, DO  hydrALAZINE (APRESOLINE) 50 MG tablet take 1 tablet (50mg ) by mouth three times a day 08/18/18  Yes Rumball, Bryson Ha, DO  latanoprost (XALATAN) 0.005 % ophthalmic solution Place 1 drop into both eyes at bedtime.   Yes [provider]  levothyroxine (SYNTHROID, LEVOTHROID) 75 MCG tablet Take 1 tablet (75 mcg total) by mouth daily. 08/18/18  Yes Rory Percy, DO  Multiple Vitamin (GNP ESSENTIAL ONE DAILY) TABS TAKE 1 TABLET BY MOUTH EVERY DAY Patient taking differently: Take 1 tablet by mouth daily.  06/01/18  Yes Mayo, Pete Pelt, MD  pantoprazole (PROTONIX) 20 MG tablet TAKE 1 TABLET BY MOUTH 2 TIMES DAILY Patient taking differently: Take 20 mg by mouth 2 (two) times daily.  09/10/18  Yes Tammi Klippel, Sherin, DO  polyethylene glycol powder (GLYCOLAX/MIRALAX) powder Take 17 g by mouth 2 (two) times daily. Hold for loose stools Patient taking differently: Take 17 g by mouth 2 (two) times daily as needed for mild constipation. Hold for loose stools 04/21/18  Yes Mayo, Pete Pelt, MD  pravastatin (PRAVACHOL) 40 MG tablet take 1 tablet (40 mg) by mouth daily at bedtime 08/18/18   Yes Rumball, Alison, DO  sodium bicarbonate 650 MG tablet Take 1 tablet (650 mg total) by mouth 2 (two) times daily. 07/16/13  Yes Marin Olp, MD  sodium zirconium cyclosilicate (LOKELMA) 5 g packet Take 5 g by mouth daily. 09/01/18  Yes Wilber Oliphant, MD  vitamin B-12 (CYANOCOBALAMIN) 100 MCG tablet TAKE 1 TABLET BY MOUTH EVERY DAY Patient taking differently: Take 100 mcg by mouth daily.  09/10/18  Yes Tammi Klippel, Sherin, DO  feeding supplement (BOOST / RESOURCE BREEZE) LIQD Take 1 Container by mouth 3 (three) times daily between meals. Patient not taking: Reported on 08/25/2018 08/29/17   Carlyle Dolly, MD  furosemide (LASIX) 40 MG tablet TAKE 1 TABLET BY MOUTH EVERY DAY Patient  not taking: Reported on 09/18/2018 06/17/18   Mayo, Pete Pelt, MD  glucose blood (ONE TOUCH ULTRA TEST) test strip Check blood sugar three times daily 07/30/18   Rory Percy, DO  Lancets [facility] ULTRASOFT) lancets Check sugar up to 6 x daily 07/10/18   Rory Percy, DO  liraglutide (VICTOZA) 18 MG/3ML SOPN Inject 0.1 mLs (0.6 mg total) into the skin daily. 07/21/18 08/25/18  Rory Percy, DO  ONETOUCH DELICA LANCETS FINE MISC 1 each by Does not apply route every morning. 03/29/15   Olam Idler, MD    Family History Family History  Problem Relation Age of Onset  . Cancer Father        prostate  . Heart disease Mother     Social History Social History   Tobacco Use  . Smoking status: Former Smoker    Types: Cigarettes    Last attempt to quit: 01/18/2011    Years since quitting: 7.6  . Smokeless tobacco: Never Used  . Tobacco comment: 08/27/2017 "didn't smoke alot; can't tell you how long I smoked"  Substance Use Topics  . Alcohol use: No  . Drug use: No     Allergies   Patient has no known allergies.   Review of Systems Review of Systems  Constitutional: Negative for fever.  HENT: Negative for sore throat.   Eyes: Negative for visual disturbance.  Respiratory: Negative for shortness  of breath.   Cardiovascular: Negative for chest pain.  Gastrointestinal: Positive for abdominal pain, constipation and nausea. Negative for diarrhea and vomiting.  Genitourinary: Negative for difficulty urinating and dysuria.  Musculoskeletal: Negative for back pain and neck stiffness.  Skin: Negative for rash.  Neurological: Negative for syncope and headaches.     Physical Exam Updated Vital Signs BP 139/88 (BP Location: Right Arm)   Pulse 94   Temp 98.6 F (37 C) (Oral)   Resp 16   SpO2 97%   Physical Exam  Constitutional: He is oriented to person, place, and time. He appears well-developed and well-nourished. No distress.  HENT:  Head: Normocephalic and atraumatic.  Eyes: Conjunctivae and EOM are normal.  Neck: Normal range of motion.  Cardiovascular: Normal rate, regular rhythm, normal heart sounds and intact distal pulses. Exam reveals no gallop and no friction rub.  No murmur heard. Pulmonary/Chest: Effort normal and breath sounds normal. No respiratory distress. He has no wheezes. He has no rales.  Abdominal: Soft. He exhibits distension. There is tenderness in the left upper quadrant and left lower quadrant. There is no guarding.  Musculoskeletal: He exhibits no edema.  Neurological: He is alert and oriented to person, place, and time.  Skin: Skin is warm and dry. He is not diaphoretic.  Nursing note and vitals reviewed.    ED Treatments / Results  Labs (all labs ordered are listed, but only abnormal results are displayed) Labs Reviewed  COMPREHENSIVE METABOLIC PANEL - Abnormal; Notable for the following components:      Result Value   Glucose, Bld 215 (*)    BUN 72 (*)    Creatinine, Ser 2.25 (*)    Calcium 8.7 (*)    AST 43 (*)    GFR calc non Af Amer 25 (*)    GFR calc Af Amer 28 (*)    All other components within normal limits  CBC - Abnormal; Notable for the following components:   WBC 10.6 (*)    RDW 17.4 (*)    All other components within normal  limits  LIPASE, BLOOD  URINALYSIS, ROUTINE W REFLEX MICROSCOPIC  BASIC METABOLIC PANEL  CBC  HEMOGLOBIN A1C    EKG None  Radiology Ct Abdomen Pelvis Wo Contrast  Result Date: 09/05/2018 CLINICAL DATA:  Constipation for the past 3 days. EXAM: CT ABDOMEN AND PELVIS WITHOUT CONTRAST TECHNIQUE: Multidetector CT imaging of the abdomen and pelvis was performed following the standard protocol without IV contrast. COMPARISON:  Abdomen pelvis radiographs obtained earlier today. Abdomen ultrasound dated 04/01/2011. FINDINGS: Lower chest: Large hiatal hernia. Small bilateral pleural effusions and associated mild bibasilar atelectasis. Atheromatous coronary artery calcifications. Hepatobiliary: No focal liver abnormality is seen. No gallstones, gallbladder wall thickening, or biliary dilatation. Pancreas: Unremarkable. No pancreatic ductal dilatation or surrounding inflammatory changes. Spleen: Normal in size without focal abnormality. Adrenals/Urinary Tract: Both kidneys are small. Normal appearing adrenal glands, urinary bladder and ureters. Stomach/Bowel: Normal caliber colon containing multiple diverticula. Diffusely dilated small bowel and stomach. Vascular/Lymphatic: Right upper quadrant vascular coils. Extensive atheromatous arterial calcifications. No enlarged lymph nodes. Reproductive: Mildly enlarged prostate gland. Other: Anterior abdominal wall surgical sutures. Small amount of free peritoneal fluid. Small left inguinal hernia containing fat. Musculoskeletal: Diffuse osteopenia. Lumbar and lower thoracic spine degenerative changes. IMPRESSION: 1. Distal small bowel obstruction. The cause of obstruction is not identified, most likely due to an adhesion. 2. Large hiatal hernia. 3. Small bilateral pleural effusions and associated mild bibasilar atelectasis. 4. Extensive atheromatous arterial calcifications, including the coronary arteries. 5. Mild prostatic hypertrophy. 6. Scattered colonic diverticula.  Electronically Signed   By: Claudie Revering M.D.   On: 08/30/2018 17:08   Dg Abd 2 Views  Result Date: 09/05/2018 CLINICAL DATA:  Left-sided abdominal pain, constipation. EXAM: ABDOMEN - 2 VIEW COMPARISON:  Radiographs of March 20, 2018. FINDINGS: Dilated small bowel loops are seen in the left side of the abdomen. Moderate amount of stool is noted in the colon. Surgical clips are seen in the left upper quadrant. Vascular calcifications are noted. IMPRESSION: Dilated small bowel loops are seen in the left side of the abdomen concerning for distal small bowel obstruction. Continued radiographic follow-up is recommended. Moderate stool burden is noted. Electronically Signed   By: Marijo Conception, M.D.   On: 08/30/2018 10:42    Procedures Procedures (including critical care time)  Medications Ordered in ED Medications  diatrizoate meglumine-sodium (GASTROGRAFIN) 66-10 % solution 90 mL (has no administration in time range)  enoxaparin (LOVENOX) injection 30 mg (has no administration in time range)  insulin aspart (novoLOG) injection 0-9 Units (has no administration in time range)  morphine 4 MG/ML injection 4 mg (4 mg Intravenous Given 09/23/2018 1621)  ondansetron (ZOFRAN) injection 4 mg (4 mg Intravenous Given 09/17/2018 1619)     Initial Impression / Assessment and Plan / ED Course  I have reviewed the triage vital signs and the nursing notes.  Pertinent labs & imaging results that were available during my care of the patient were reviewed by me and considered in my medical decision making (see chart for details).      84 year old male with hearing impairment, CKD stage IV, diabetes, presents with concern for abdominal pain. No sign of pancreatitis. DDx includes SBO, diverticulitis. CT ordered shows SBO.  Consulted surgery, Dr. Dema Severin who evaluated the patient. NG tube placed. Family medicine consulted for admission.   Final Clinical Impressions(s) / ED Diagnoses   Final diagnoses:  Small bowel  obstruction ([place])  Generalized abdominal pain    ED Discharge Orders    None  Gareth Morgan, MD 09/09/2018 2246

## 2018-09-24 NOTE — ED Provider Notes (Signed)
[facility]    CSN: [id number] Arrival date & time: 09/06/2018  8588     History   Chief Complaint Chief Complaint  Patient presents with  . Constipation    HPI Philip Matthews is a 84 y.o. male.   Chief complaint is constipation x3 days.  Bowel movements are normally daily.  History is obtained through sign language interpreter.  Patient has chronic kidney disease and takes Lasix regularly but had accidentally omitted the Lasix.  It was recently added back at double dose since he had edema.  He does have chronic kidney disease.  He also has long-standing diabetes with neuropathy.  HPI  Past Medical History:  Diagnosis Date  . Anemia   . Arthritis    "joints" (08/27/2017)  . ARTHRITIS, LEFT SHOULDER 02/01/2010   Qualifier: Diagnosis of  By: Jacques Earthly    . Chronic kidney disease (CKD), stage IV (severe) ([place])   . Deaf   . Diverticulosis   . DIVERTICULOSIS OF COLON 03/08/2010   Qualifier: Diagnosis of  By: Jacques Earthly    . Esophagitis    HSV  . Gout   . History of DVT (deep vein thrombosis)   . Hyperlipidemia   . Hypertension   . Hypothyroidism   . Kidney mass    lipoma  . Liver nodule    u/s 310  . Portal vein thrombosis    on life long coumadin  . PUD, HX OF 03/08/2010   Qualifier: History of  By: Jacques Earthly    . Type II diabetes mellitus [facility])     Patient Active Problem List   Diagnosis Date Noted  . Renal insufficiency   . RTA (renal tubular acidosis)   . Hypertensive urgency   . Syncope 03/20/2018  . Hypoglycemia   . Melena   . GI bleed 08/28/2017  . Bilateral deafness   . Uses visual frame sign language interpreter   . Right thigh pain 07/24/2017  . Neoplasm, uncertain whether benign or malignant 04/15/2017  . Osteoarthritis of left shoulder 04/07/2017  . Health care maintenance 10/23/2016  . Chronic anticoagulation 10/23/2016  . Elevated LFTs 03/18/2016  . Left shoulder pain 09/28/2015  . Bradycardia 12/01/2013  .  Hyperkalemia 11/11/2012  . Allergic rhinitis 09/08/2012  . Primary open-angle glaucoma 08/05/2012  . Hypothyroid 06/17/2012  . Bilateral leg edema 05/30/2012  . BARRETTS ESOPHAGUS 07/11/2010  . Mixed hyperlipidemia 06/20/2010  . Portal vein thrombosis 06/08/2010  . Gout 06/07/2010  . LIVER MASS 03/08/2010  . LUNG NODULE 03/08/2010  . Type II diabetes mellitus, controlled but with peripheral neuropathy 02/01/2010  . Anemia in chronic kidney disease 02/01/2010  . Isolated systolic HTN, goal SBP <502.  02/01/2010  . Chronic kidney disease (CKD), stage III (moderate) ([place]) 02/01/2010    Past Surgical History:  Procedure Laterality Date  . COLON SURGERY     "something was twisted; that messed w/my BM; had to have it untwisted"  . HERNIA REPAIR    . LIVER LOBECTOMY    . SHOULDER ARTHROCENTESIS Left     OB History   None      Home Medications    Prior to Admission medications   Medication Sig Start Date End Date Taking? Authorizing Provider  calcium-vitamin D (OSCAL-500) 500-400 MG-UNIT tablet TAKE 1 TABLET BY MOUTH 2 TIMES DAILY 09/10/18   Caroline More, DO  FEROSUL 325 (65 Fe) MG tablet TAKE 1 TABLET BY MOUTH EVERY DAY 09/10/18   Caroline More,  DO  pantoprazole (PROTONIX) 20 MG tablet TAKE 1 TABLET BY MOUTH 2 TIMES DAILY 09/10/18   Caroline More, DO  vitamin B-12 (CYANOCOBALAMIN) 100 MCG tablet TAKE 1 TABLET BY MOUTH EVERY DAY 09/10/18   Caroline More, DO  acetaminophen (TYLENOL) 500 MG tablet Take 500 mg by mouth every 6 (six) hours as needed (pain).    [provider]  allopurinol (ZYLOPRIM) 100 MG tablet take 1 tablet (100 mg) by mouth once daily 08/18/18   Rory Percy, DO  brimonidine-timolol (COMBIGAN) 0.2-0.5 % ophthalmic solution Place 1 drop into both eyes every 12 (twelve) hours.    [provider]  calcitRIOL (ROCALTROL) 0.25 MCG capsule Take 1 capsule (0.25 mcg total) by mouth every Monday, Wednesday, and Friday. 04/22/18   Mayo, Pete Pelt, MD    docusate sodium (COLACE) 100 MG capsule Take 1 capsule (100 mg total) by mouth 2 (two) times daily as needed (constipation). Over the counter 04/21/18   Mayo, Pete Pelt, MD  epoetin alfa (PROCRIT) 38101 UNIT/ML injection Inject 1 mL (10,000 Units total) into the skin 3 (three) times a week. Patient taking differently: Inject 10,000 Units into the skin every 14 (fourteen) days.  09/24/11   Lyndal Pulley, DO  feeding supplement (BOOST / RESOURCE BREEZE) LIQD Take 1 Container by mouth 3 (three) times daily between meals. Patient not taking: Reported on 08/25/2018 08/29/17   Carlyle Dolly, MD  furosemide (LASIX) 20 MG tablet Please take 40mg  (2 tablets) in the morning and 20mg  at bedtime (1 tablet) for the next 5 days. 09/17/18   Rory Percy, DO  furosemide (LASIX) 40 MG tablet TAKE 1 TABLET BY MOUTH EVERY DAY 06/17/18   Mayo, Pete Pelt, MD  glucose blood (ONE TOUCH ULTRA TEST) test strip Check blood sugar three times daily 07/30/18   Rory Percy, DO  hydrALAZINE (APRESOLINE) 50 MG tablet take 1 tablet (50mg ) by mouth three times a day 08/18/18   Rory Percy, DO  Lancets [facility] ULTRASOFT) lancets Check sugar up to 6 x daily 07/10/18   Rory Percy, DO  latanoprost (XALATAN) 0.005 % ophthalmic solution Place 1 drop into both eyes at bedtime.    [provider]  levothyroxine (SYNTHROID, LEVOTHROID) 75 MCG tablet Take 1 tablet (75 mcg total) by mouth daily. 08/18/18   Rory Percy, DO  liraglutide (VICTOZA) 18 MG/3ML SOPN Inject 0.1 mLs (0.6 mg total) into the skin daily. 07/21/18 08/25/18  Rory Percy, DO  Multiple Vitamin (GNP ESSENTIAL ONE DAILY) TABS TAKE 1 TABLET BY MOUTH EVERY DAY 06/01/18   Mayo, Pete Pelt, MD  [facility] DELICA LANCETS FINE MISC 1 each by Does not apply route every morning. 03/29/15   Olam Idler, MD  polyethylene glycol powder (GLYCOLAX/MIRALAX) powder Take 17 g by mouth 2 (two) times daily. Hold for loose stools Patient taking differently: Take 17  g by mouth 2 (two) times daily as needed for mild constipation. Hold for loose stools 04/21/18   Mayo, Pete Pelt, MD  pravastatin (PRAVACHOL) 40 MG tablet take 1 tablet (40 mg) by mouth daily at bedtime 08/18/18   Rory Percy, DO  sodium bicarbonate 650 MG tablet Take 1 tablet (650 mg total) by mouth 2 (two) times daily. 07/16/13   Marin Olp, MD  sodium zirconium cyclosilicate (LOKELMA) 5 g packet Take 5 g by mouth daily. 09/01/18   Wilber Oliphant, MD    Family History Family History  Problem Relation Age of Onset  . Cancer Father  prostate  . Heart disease Mother     Social History Social History   Tobacco Use  . Smoking status: Former Smoker    Types: Cigarettes    Last attempt to quit: 01/18/2011    Years since quitting: 7.6  . Smokeless tobacco: Never Used  . Tobacco comment: 08/27/2017 "didn't smoke alot; can't tell you how long I smoked"  Substance Use Topics  . Alcohol use: No  . Drug use: No     Allergies   Patient has no known allergies.   Review of Systems Review of Systems  Constitutional: Negative.   HENT: Negative.   Respiratory: Negative.   Cardiovascular: Negative.   Gastrointestinal: Positive for abdominal distention, abdominal pain and constipation.  Genitourinary: Negative.   Neurological: Negative.   Psychiatric/Behavioral: Negative.      Physical Exam Triage Vital Signs ED Triage Vitals  Enc Vitals Group     BP 08/30/2018 0937 132/84     Pulse Rate 09/09/2018 0937 90     Resp 09/06/2018 0937 16     Temp 09/05/2018 0937 97.9 F (36.6 C)     Temp Source 09/11/2018 0937 Oral     SpO2 09/10/2018 0937 100 %     Weight 09/20/2018 0934 125 lb (56.7 kg)     Height --      Head Circumference --      Peak Flow --      Pain Score 09/22/2018 0934 10     Pain Loc --      Pain Edu? --      Excl. in [place]? --    No data found.  Updated Vital Signs BP 132/84 (BP Location: Left Arm)   Pulse 90   Temp 97.9 F (36.6 C) (Oral)   Resp 16   Wt 56.7 kg    SpO2 100%   BMI 20.80 kg/m   Visual Acuity Right Eye Distance:   Left Eye Distance:   Bilateral Distance:    Right Eye Near:   Left Eye Near:    Bilateral Near:     Physical Exam  Constitutional: He appears well-developed and well-nourished.  Cardiovascular: Normal rate.  Pulmonary/Chest: Effort normal and breath sounds normal.  Abdominal:  Bowel sounds are hypoactive There is no rebound or guarding. There is a midline scar secondary to previous surgery some 30 to 40 years ago.  Sounds like there was partial bowel resection at that time     UC Treatments / Results  Labs (all labs ordered are listed, but only abnormal results are displayed) Labs Reviewed - No data to display  EKG None  Radiology No results found.  Procedures Procedures (including critical care time)  Medications Ordered in UC Medications - No data to display  Initial Impression / Assessment and Plan / UC Course  I have reviewed the triage vital signs and the nursing notes.  Pertinent labs & imaging results that were available during my care of the patient were reviewed by me and considered in my medical decision making (see chart for details).     Constipation, uncertain etiology.  May need to increase fluid intake as well as increase MiraLAX.  I have ordered x-ray but due to end of shift will pass this note off to colleague. Final Clinical Impressions(s) / UC Diagnoses   Final diagnoses:  None   Discharge Instructions   None    ED Prescriptions    None     Controlled Substance Prescriptions [place] Controlled Substance Registry consulted?  No   Wardell Honour, MD 09/05/2018 1010

## 2018-09-24 NOTE — H&P (Addendum)
[facility] Admission History and Physical Service Pager: [phone]  Patient name: Philip Matthews Medical record number: [id number] Date of birth: 1928-05-11 Age: 84 y.o. Gender: male  Primary Care Provider: Rory Percy, DO Consultants: Michaell Cowing Code Status: Full  Chief Complaint: Abdominal pain  Assessment and Plan: Philip Matthews is a 84 y.o. male presenting with a bowel obstruction. PMH is significant for T2DM, DVT, HTN, peripheral neuropathy, hyperlipidemia, gout, anemia, CKD, and bilateral deafness.  Abdominal Pain: Patient is presenting with 1 day of abominal pain and feeling constipated. CT abdomen shows distal small bowel obstruction most likely due to adhesion, a large hiatal hernia and scattered colonic diverticula. Patient has a history of hernia repair and colon surgery. NG tube in place is actively suctioning bilious stomach contents. Abdominal pain can be attributed to Small Bowel Obstruction. Abdominal pain less likely due to pancreatitis as lipase is normal at 35 and pain improved with NG suction. CT scan decreases likelihood of gallstones and cholecystitis as cause of pain. CT shows small left inguinal hernia containing fat and large hiatal hernia, but patient is asymptomatic at this time.  - Admit to med-surg, Attending Dr. Erin Hearing - Surgery following - appreciate recs - SBO Protocol (NGT, gastrograffin, serial XRs)  - NPO - Vitals - AM BMP, CBC - follow up UA - IV Zofran PRN Nausea - Up with assistance - Tylenol PRN Pain  T2DM: Most recent HbA1c 8.9% from July 2019. Patient takes Victoza 0.6mg  daily at home. Patient has neuropathy. - sSSI - Repeat A1c - Hold Victoza  HTN: BP on admission between 109-177 / 75-106. Takes Hydralazine 50mg  TID, Lasix 40mg  qAM and 20mg  qPM. Last Echo March 2019 showed EF 55-60%, mild LVH without regional wall motion abnormalities, thickened leaflets of mitral valve.  - Monitor vitals - Hold  medications while NPO  CKD stage III: secondary to distal RTA and T2DM. BUN acutely elevated to 72, GFR 28, Cr slightly elevated at 2.25, baseline Scr ~2.0.  Home Lasix dose 40mg  qAM and 20 qPM. Takes Calcitriol 0.70mcg every M-W-F and Sodium Bicarbonate 650mg  daily. - IV Fluids - Avoid nephrotoxic meds - Monitor with BMPs - Hold meds while NPO  Hx of Hyperkalemia: Potassium normal this admission at 5.0. Takes Lokelma 5mg  daily. - Monitor with BMPs - Hold meds while NPO. - closely monitor K  Constipation: last "normal" BM about 3-4 days ago. Patient has been prescribed Miralax but recently switched from scheduled to PRN. Also prescribed Colace 100mg  BID PRN. - Hold meds while NPO.  Anemia of chronic disease: Likely due to CKD. Hemoglobin this admission 13.5, baseline around 10.  History of colonoscopy in 2011 with diverticulosis of colon. Currently using Procrit injection 10,000 Units every 14 days and Ferosul 325mg  daily. - Monitor with CBC  GERD: takes Protonix 20mg  BID. - Protonix once no longer NPO  Gout: Patient takes allopurinol 100mg  daily at home. - Hold until no longer NPO  Hypothyroidism: Most recent TSH normal at 4.047 (August 2019). Takes Synthroid 63mcg daily. -Continue Synthroid once no longer NPO  Hyperlipidemia: Most recent lipid panel from 2017 shows slightly elevated cholesterol at 101. He is taking Pravastatin 40mg  at home. -Continue statin once no longer NPO  Primary open angle glaucoma: Right in mild stage, Left in severe stage. Patient currently taking Brimonidine-Timolol 1 drop BID, and Lantoprost 1 drop at bedtime. Most recent follow-up appointment with ophthalmology 09/23/2018. -Continue home eye drops  Leukocytosis: WBC count minimally elevated to 10.6. Temperature 96.8*F  on admission and otherwise asymptomatic for infection. Most likely due to acute SBO. - Monitor  History of Portal Vein Thrombosis and DVT: patient was taking Coumadin until March 2019.  No signs of thrombosis on physical exam this admission. - Monitor  FEN/GI: NPO Prophylaxis: Lovenox  Disposition: Admit to med-surg  History of Present Illness:  Philip Matthews is a 84 y.o. male presenting with 1 day of abdominal pain, nausea, feeling like his stomach was "all mixed up", and feeling very constipated. Patient states he tried to have a bowel movement but nothing would come out.  His last bowel movement was yesterday but was very small.  His last normal bowel movement was 3 to 4 days ago.  He denies vomiting.  He states he feels much better with the NG tube in place.  He states this feeling of abdominal pain and constipation happened once a long time ago in the 1990s for which he had abdominal surgery.  The patient was also asked about his Lasix but could not report anything.  He doesn't think he has seen [facility] yet as an outpatient for his K.   Review Of Systems: Per HPI with the following additions:   Review of Systems  Constitutional: Negative for chills and fever.  Respiratory: Negative for cough and hemoptysis.   Cardiovascular: Negative for leg swelling.  Gastrointestinal: Positive for abdominal pain, constipation and nausea. Negative for vomiting.    Patient Active Problem List   Diagnosis Date Noted  . SBO (small bowel obstruction) ([place]) 09/18/2018  . Renal insufficiency   . RTA (renal tubular acidosis)   . Hypertensive urgency   . Syncope 03/20/2018  . Hypoglycemia   . Melena   . GI bleed 08/28/2017  . Bilateral deafness   . Uses visual frame sign language interpreter   . Right thigh pain 07/24/2017  . Neoplasm, uncertain whether benign or malignant 04/15/2017  . Osteoarthritis of left shoulder 04/07/2017  . Health care maintenance 10/23/2016  . Chronic anticoagulation 10/23/2016  . Elevated LFTs 03/18/2016  . Left shoulder pain 09/28/2015  . Bradycardia 12/01/2013  . Hyperkalemia 11/11/2012  . Allergic rhinitis 09/08/2012  . Primary  open-angle glaucoma 08/05/2012  . Hypothyroid 06/17/2012  . Bilateral leg edema 05/30/2012  . BARRETTS ESOPHAGUS 07/11/2010  . Mixed hyperlipidemia 06/20/2010  . Portal vein thrombosis 06/08/2010  . Gout 06/07/2010  . LIVER MASS 03/08/2010  . LUNG NODULE 03/08/2010  . Type II diabetes mellitus, controlled but with peripheral neuropathy 02/01/2010  . Anemia in chronic kidney disease 02/01/2010  . Isolated systolic HTN, goal SBP <144.  02/01/2010  . Chronic kidney disease (CKD), stage III (moderate) ([place]) 02/01/2010    Past Medical History: Past Medical History:  Diagnosis Date  . Anemia   . Arthritis    "joints" (08/27/2017)  . ARTHRITIS, LEFT SHOULDER 02/01/2010   Qualifier: Diagnosis of  By: Jacques Earthly    . Chronic kidney disease (CKD), stage IV (severe) ([place])   . Deaf   . Diverticulosis   . DIVERTICULOSIS OF COLON 03/08/2010   Qualifier: Diagnosis of  By: Jacques Earthly    . Esophagitis    HSV  . Gout   . History of DVT (deep vein thrombosis)   . Hyperlipidemia   . Hypertension   . Hypothyroidism   . Kidney mass    lipoma  . Liver nodule    u/s 310  . Portal vein thrombosis    on life long coumadin  . PUD,  HX OF 03/08/2010   Qualifier: History of  By: Jacques Earthly    . Type II diabetes mellitus ([place])     Past Surgical History: Past Surgical History:  Procedure Laterality Date  . COLON SURGERY     "something was twisted; that messed w/my BM; had to have it untwisted"  . HERNIA REPAIR    . LIVER LOBECTOMY    . SHOULDER ARTHROCENTESIS Left     Social History: Social History   Tobacco Use  . Smoking status: Former Smoker    Types: Cigarettes    Last attempt to quit: 01/18/2011    Years since quitting: 7.6  . Smokeless tobacco: Never Used  . Tobacco comment: 08/27/2017 "didn't smoke alot; can't tell you how long I smoked"  Substance Use Topics  . Alcohol use: No  . Drug use: No   Family History: Family History  Problem Relation Age of Onset   . Cancer Father        prostate  . Heart disease Mother    Allergies and Medications: No Known Allergies No current facility-administered medications on file prior to encounter.    Current Outpatient Medications on File Prior to Encounter  Medication Sig Dispense Refill  . acetaminophen (TYLENOL) 500 MG tablet Take 500 mg by mouth every 6 (six) hours as needed (pain).    Marland Kitchen allopurinol (ZYLOPRIM) 100 MG tablet take 1 tablet (100 mg) by mouth once daily 90 tablet 1  . brimonidine-timolol (COMBIGAN) 0.2-0.5 % ophthalmic solution Place 1 drop into both eyes every 12 (twelve) hours.    . calcitRIOL (ROCALTROL) 0.25 MCG capsule Take 1 capsule (0.25 mcg total) by mouth every Monday, Wednesday, and Friday. 30 capsule 2  . calcium-vitamin D (OSCAL-500) 500-400 MG-UNIT tablet TAKE 1 TABLET BY MOUTH 2 TIMES DAILY (Patient taking differently: Take 1 tablet by mouth 2 (two) times daily. ) 180 tablet 3  . docusate sodium (COLACE) 100 MG capsule Take 1 capsule (100 mg total) by mouth 2 (two) times daily as needed (constipation). Over the counter 60 capsule 0  . epoetin alfa (PROCRIT) 09604 UNIT/ML injection Inject 1 mL (10,000 Units total) into the skin 3 (three) times a week. (Patient taking differently: Inject 10,000 Units into the skin every 14 (fourteen) days. ) 1 mL   . FEROSUL 325 (65 Fe) MG tablet TAKE 1 TABLET BY MOUTH EVERY DAY (Patient taking differently: Take 325 mg by mouth daily. ) 30 tablet 5  . furosemide (LASIX) 20 MG tablet Please take 40mg  (2 tablets) in the morning and 20mg  at bedtime (1 tablet) for the next 5 days. (Patient taking differently: Take 20-40 mg by mouth See admin instructions. Take 40mg  (2 tablets) in the morning and 20mg  (1 tablet) at bedtime.) 15 tablet 0  . hydrALAZINE (APRESOLINE) 50 MG tablet take 1 tablet (50mg ) by mouth three times a day 270 tablet 3  . latanoprost (XALATAN) 0.005 % ophthalmic solution Place 1 drop into both eyes at bedtime.    Marland Kitchen levothyroxine  (SYNTHROID, LEVOTHROID) 75 MCG tablet Take 1 tablet (75 mcg total) by mouth daily. 90 tablet 0  . Multiple Vitamin (GNP ESSENTIAL ONE DAILY) TABS TAKE 1 TABLET BY MOUTH EVERY DAY (Patient taking differently: Take 1 tablet by mouth daily. ) 30 tablet 5  . pantoprazole (PROTONIX) 20 MG tablet TAKE 1 TABLET BY MOUTH 2 TIMES DAILY (Patient taking differently: Take 20 mg by mouth 2 (two) times daily. ) 60 tablet 5  . polyethylene glycol powder (GLYCOLAX/MIRALAX) powder  Take 17 g by mouth 2 (two) times daily. Hold for loose stools (Patient taking differently: Take 17 g by mouth 2 (two) times daily as needed for mild constipation. Hold for loose stools) 527 g 6  . pravastatin (PRAVACHOL) 40 MG tablet take 1 tablet (40 mg) by mouth daily at bedtime 90 tablet 1  . sodium bicarbonate 650 MG tablet Take 1 tablet (650 mg total) by mouth 2 (two) times daily. 180 tablet 3  . sodium zirconium cyclosilicate (LOKELMA) 5 g packet Take 5 g by mouth daily. 30 packet 0  . vitamin B-12 (CYANOCOBALAMIN) 100 MCG tablet TAKE 1 TABLET BY MOUTH EVERY DAY (Patient taking differently: Take 100 mcg by mouth daily. ) 30 tablet 5  . feeding supplement (BOOST / RESOURCE BREEZE) LIQD Take 1 Container by mouth 3 (three) times daily between meals. (Patient not taking: Reported on 08/25/2018) 30 Container 0  . furosemide (LASIX) 40 MG tablet TAKE 1 TABLET BY MOUTH EVERY DAY (Patient not taking: Reported on 09/20/2018) 45 tablet 1  . glucose blood (ONE TOUCH ULTRA TEST) test strip Check blood sugar three times daily 100 each 3  . Lancets (ONETOUCH ULTRASOFT) lancets Check sugar up to 6 x daily 200 each 4  . liraglutide (VICTOZA) 18 MG/3ML SOPN Inject 0.1 mLs (0.6 mg total) into the skin daily. 1 pen 0  . ONETOUCH DELICA LANCETS FINE MISC 1 each by Does not apply route every morning. 100 each 11   Objective: BP 139/88 (BP Location: Right Arm)   Pulse 94   Temp 98.6 F (37 C) (Oral)   Resp 16   SpO2 97%   Physical Exam   Constitutional: He appears well-developed.  Cachectic, frail, deaf  HENT:  Head: Normocephalic.  Eyes: EOM are normal.  Cardiovascular: Normal rate and regular rhythm.  Pulmonary/Chest: Effort normal and breath sounds normal. No respiratory distress.  Abdominal: Soft. Normal appearance. Bowel sounds are increased.  NG tube in place  Neurological: He is alert.  Skin: Skin is warm. Capillary refill takes less than 2 seconds.  Psychiatric: He has a normal mood and affect. His behavior is normal.   Labs and Imaging: CBC BMET  Recent Labs  Lab 09/01/2018 1131  WBC 10.6*  HGB 13.5  HCT 45.0  PLT 159   Recent Labs  Lab 09/27/2018 1131  NA 139  K 5.0  CL 99  CO2 26  BUN 72*  CREATININE 2.25*  GLUCOSE 215*  CALCIUM 8.7*     Ct Abdomen Pelvis Wo Contrast  Result Date: 09/02/2018 CLINICAL DATA:  Constipation for the past 3 days. EXAM: CT ABDOMEN AND PELVIS WITHOUT CONTRAST TECHNIQUE: Multidetector CT imaging of the abdomen and pelvis was performed following the standard protocol without IV contrast. COMPARISON:  Abdomen pelvis radiographs obtained earlier today. Abdomen ultrasound dated 04/01/2011. FINDINGS: Lower chest: Large hiatal hernia. Small bilateral pleural effusions and associated mild bibasilar atelectasis. Atheromatous coronary artery calcifications. Hepatobiliary: No focal liver abnormality is seen. No gallstones, gallbladder wall thickening, or biliary dilatation. Pancreas: Unremarkable. No pancreatic ductal dilatation or surrounding inflammatory changes. Spleen: Normal in size without focal abnormality. Adrenals/Urinary Tract: Both kidneys are small. Normal appearing adrenal glands, urinary bladder and ureters. Stomach/Bowel: Normal caliber colon containing multiple diverticula. Diffusely dilated small bowel and stomach. Vascular/Lymphatic: Right upper quadrant vascular coils. Extensive atheromatous arterial calcifications. No enlarged lymph nodes. Reproductive: Mildly enlarged  prostate gland. Other: Anterior abdominal wall surgical sutures. Small amount of free peritoneal fluid. Small left inguinal hernia containing fat. Musculoskeletal:  Diffuse osteopenia. Lumbar and lower thoracic spine degenerative changes. IMPRESSION: 1. Distal small bowel obstruction. The cause of obstruction is not identified, most likely due to an adhesion. 2. Large hiatal hernia. 3. Small bilateral pleural effusions and associated mild bibasilar atelectasis. 4. Extensive atheromatous arterial calcifications, including the coronary arteries. 5. Mild prostatic hypertrophy. 6. Scattered colonic diverticula. Electronically Signed   By: Claudie Revering M.D.   On: 09/21/2018 17:08   Dg Abd 2 Views  Result Date: 09/28/2018 CLINICAL DATA:  Left-sided abdominal pain, constipation. EXAM: ABDOMEN - 2 VIEW COMPARISON:  Radiographs of March 20, 2018. FINDINGS: Dilated small bowel loops are seen in the left side of the abdomen. Moderate amount of stool is noted in the colon. Surgical clips are seen in the left upper quadrant. Vascular calcifications are noted. IMPRESSION: Dilated small bowel loops are seen in the left side of the abdomen concerning for distal small bowel obstruction. Continued radiographic follow-up is recommended. Moderate stool burden is noted. Electronically Signed   By: Marijo Conception, M.D.   On: 09/23/2018 10:42     Daisy Floro, DO 09/26/2018, 10:51 PM PGY-1, [place] pager: [phone], text pages welcome  FPTS Upper-Level Resident Addendum   I have independently interviewed and examined the patient. I have discussed the above with the original author and agree with their documentation. My edits for correction/addition/clarification are in blue. Please see also any attending notes.    Ralene Ok, MD PGY-3, [place] Service pager: [phone] (text pages welcome through [facility])

## 2018-09-24 NOTE — ED Triage Notes (Signed)
Pt daughter states the pt is constipated x 3 day

## 2018-09-24 NOTE — ED Notes (Signed)
Pts family reports no relief with Miralax and Prune juice

## 2018-09-24 NOTE — ED Notes (Signed)
Pt reports not taking Lasix since June and has recently started taking lasix d/t leg swelling last week, dose was then increased and now is constipated

## 2018-09-25 DIAGNOSIS — K56609 Unspecified intestinal obstruction, unspecified as to partial versus complete obstruction: Secondary | ICD-10-CM

## 2018-09-25 DIAGNOSIS — R1084 Generalized abdominal pain: Secondary | ICD-10-CM

## 2018-09-25 LAB — URINALYSIS, ROUTINE W REFLEX MICROSCOPIC
Bilirubin Urine: NEGATIVE
GLUCOSE, UA: NEGATIVE mg/dL
KETONES UR: NEGATIVE mg/dL
Nitrite: NEGATIVE
PROTEIN: 30 mg/dL — AB
Specific Gravity, Urine: 1.014 (ref 1.005–1.030)
pH: 5 (ref 5.0–8.0)

## 2018-09-25 LAB — BASIC METABOLIC PANEL
Anion gap: 11 (ref 5–15)
BUN: 83 mg/dL — AB (ref 8–23)
CO2: 30 mmol/L (ref 22–32)
CREATININE: 2.46 mg/dL — AB (ref 0.61–1.24)
Calcium: 9 mg/dL (ref 8.9–10.3)
Chloride: 100 mmol/L (ref 98–111)
GFR, EST AFRICAN AMERICAN: 26 mL/min — AB (ref >60)
GFR, EST NON AFRICAN AMERICAN: 22 mL/min — AB (ref >60)
Glucose, Bld: 156 mg/dL — ABNORMAL HIGH (ref 70–99)
POTASSIUM: 5.3 mmol/L — AB (ref 3.5–5.1)
SODIUM: 141 mmol/L (ref 135–145)

## 2018-09-25 LAB — GLUCOSE, CAPILLARY
GLUCOSE-CAPILLARY: 136 mg/dL — AB (ref 70–99)
Glucose-Capillary: 132 mg/dL — ABNORMAL HIGH (ref 70–99)
Glucose-Capillary: 137 mg/dL — ABNORMAL HIGH (ref 70–99)
Glucose-Capillary: 136 mg/dL — ABNORMAL HIGH (ref 70–99)
Glucose-Capillary: 113 mg/dL — ABNORMAL HIGH (ref 70–99)
Glucose-Capillary: 112 mg/dL — ABNORMAL HIGH (ref 70–99)

## 2018-09-25 LAB — CBC
HEMATOCRIT: 44.7 % (ref 39.0–52.0)
Hemoglobin: 13.3 g/dL (ref 13.0–17.0)
MCH: 27 pg (ref 26.0–34.0)
MCHC: 29.8 g/dL — ABNORMAL LOW (ref 30.0–36.0)
MCV: 90.7 fL (ref 78.0–100.0)
PLATELETS: 164 10*3/uL (ref 150–400)
RBC: 4.93 MIL/uL (ref 4.22–5.81)
RDW: 17.3 % — AB (ref 11.5–15.5)
WBC: 7.8 10*3/uL (ref 4.0–10.5)

## 2018-09-25 LAB — HEMOGLOBIN A1C
HEMOGLOBIN A1C: 6.8 % — AB (ref 4.8–5.6)
MEAN PLASMA GLUCOSE: 148.46 mg/dL

## 2018-09-25 MED ORDER — LEVOTHYROXINE SODIUM 100 MCG IV SOLR
37.5000 ug | Freq: Every day | INTRAVENOUS | Status: DC
  Administered 2018-09-25 – 2018-09-30 (×6): 37.5 ug via INTRAVENOUS
  Filled 2018-09-25 (×6): qty 5

## 2018-09-25 MED ORDER — LATANOPROST 0.005 % OP SOLN
1.0000 [drp] | Freq: Every day | OPHTHALMIC | Status: DC
  Administered 2018-09-25 – 2018-09-29 (×5): 1 [drp] via OPHTHALMIC
  Filled 2018-09-25: qty 2.5

## 2018-09-25 MED ORDER — SODIUM CHLORIDE 0.45 % IV SOLN
INTRAVENOUS | Status: AC
  Administered 2018-09-25 – 2018-09-29 (×9): via INTRAVENOUS

## 2018-09-25 MED ORDER — DIATRIZOATE MEGLUMINE & SODIUM 66-10 % PO SOLN
90.0000 mL | Freq: Once | ORAL | Status: AC
  Administered 2018-09-25: 90 mL via NASOGASTRIC
  Filled 2018-09-25 (×2): qty 90

## 2018-09-25 MED ORDER — BRIMONIDINE TARTRATE-TIMOLOL 0.2-0.5 % OP SOLN: 1.0000 [drp] | Freq: Two times a day (BID) | OPHTHALMIC | Status: DC

## 2018-09-25 MED ORDER — TIMOLOL MALEATE 0.5 % OP SOLN
1.0000 [drp] | Freq: Two times a day (BID) | OPHTHALMIC | Status: DC
  Administered 2018-09-25 – 2018-09-30 (×10): 1 [drp] via OPHTHALMIC
  Filled 2018-09-25 (×3): qty 5

## 2018-09-25 MED ORDER — BRIMONIDINE TARTRATE 0.2 % OP SOLN
1.0000 [drp] | Freq: Two times a day (BID) | OPHTHALMIC | Status: DC
  Administered 2018-09-25 – 2018-09-30 (×10): 1 [drp] via OPHTHALMIC
  Filled 2018-09-25 (×4): qty 5

## 2018-09-25 NOTE — Progress Notes (Signed)
[facility] Progress Note     Subjective: CC-  Interpretor at bedside. Patient states that he is feeling much better. Abdominal pain nearly resolved. Denies n/v. No flatus or BM.  Lives at home with his daughter. Fairly sedentary most of the day. Uses a cane for ambulation.  Objective: Vital signs in last 24 hours: Temp:  [96.8 F (36 C)-98.6 F (37 C)] 98 F (36.7 C) (09/27 1638) Pulse Rate:  [87-97] 88 (09/27 0614) Resp:  [16] 16 (09/27 0614) BP: (109-177)/(73-106) 131/73 (09/27 0614) SpO2:  [93 %-100 %] 96 % (09/27 0614) Weight:  [56.7 kg] 56.7 kg (09/26 0934) Last BM Date: 09/21/18  Intake/Output from previous day: 09/26 0701 - 09/27 0700 In: -  Out: 150 [Emesis/NG output:150] Intake/Output this shift: No intake/output data recorded.  PE: Gen:  Alert, NAD, pleasant HEENT: EOM's intact, pupils equal and round Card:  RRR Pulm:  CTAB, no W/R/R, effort normal Abd: Soft, mild distension, nontender abdomen, few BS heard Ext:  Calves soft and nontender Psych: A&Ox3  Skin: no rashes noted, warm and dry  Lab Results:  Recent Labs    09/20/2018 1131  WBC 10.6*  HGB 13.5  HCT 45.0  PLT 159   BMET Recent Labs    09/06/2018 1131  NA 139  K 5.0  CL 99  CO2 26  GLUCOSE 215*  BUN 72*  CREATININE 2.25*  CALCIUM 8.7*   PT/INR No results for input(s): LABPROT, INR in the last 72 hours. CMP     Component Value Date/Time   NA 139 09/27/2018 1131   NA 143 09/02/2018 1533   K 5.0 09/06/2018 1131   CL 99 09/18/2018 1131   CO2 26 09/11/2018 1131   CO2 104 09/27/2015   GLUCOSE 215 (H) 09/16/2018 1131   BUN 72 (H) 09/11/2018 1131   BUN 79 (HH) 09/02/2018 1533   BUN 39 09/27/2015   CREATININE 2.25 (H) 09/17/2018 1131   CREATININE 1.96 (H) 10/23/2016 0920   CALCIUM 8.7 (L) 09/02/2018 1131   CALCIUM 8.8 09/27/2015   PROT 7.0 09/15/2018 1131   ALBUMIN 3.7 09/25/2018 1131   ALBUMIN 3.6 09/27/2015   AST 43 (H) 09/15/2018 1131   ALT 22 09/21/2018  1131   ALKPHOS 62 09/21/2018 1131   BILITOT 0.9 09/26/2018 1131   GFRNONAA 25 (L) 09/18/2018 1131   GFRNONAA 30 (L) 10/23/2016 0920   GFRAA 28 (L) 09/04/2018 1131   GFRAA 35 (L) 10/23/2016 0920   Lipase     Component Value Date/Time   LIPASE 35 09/06/2018 1131       Studies/Results: Ct Abdomen Pelvis Wo Contrast  Result Date: 09/15/2018 CLINICAL DATA:  Constipation for the past 3 days. EXAM: CT ABDOMEN AND PELVIS WITHOUT CONTRAST TECHNIQUE: Multidetector CT imaging of the abdomen and pelvis was performed following the standard protocol without IV contrast. COMPARISON:  Abdomen pelvis radiographs obtained earlier today. Abdomen ultrasound dated 04/01/2011. FINDINGS: Lower chest: Large hiatal hernia. Small bilateral pleural effusions and associated mild bibasilar atelectasis. Atheromatous coronary artery calcifications. Hepatobiliary: No focal liver abnormality is seen. No gallstones, gallbladder wall thickening, or biliary dilatation. Pancreas: Unremarkable. No pancreatic ductal dilatation or surrounding inflammatory changes. Spleen: Normal in size without focal abnormality. Adrenals/Urinary Tract: Both kidneys are small. Normal appearing adrenal glands, urinary bladder and ureters. Stomach/Bowel: Normal caliber colon containing multiple diverticula. Diffusely dilated small bowel and stomach. Vascular/Lymphatic: Right upper quadrant vascular coils. Extensive atheromatous arterial calcifications. No enlarged lymph nodes. Reproductive: Mildly enlarged prostate gland. Other: Anterior  abdominal wall surgical sutures. Small amount of free peritoneal fluid. Small left inguinal hernia containing fat. Musculoskeletal: Diffuse osteopenia. Lumbar and lower thoracic spine degenerative changes. IMPRESSION: 1. Distal small bowel obstruction. The cause of obstruction is not identified, most likely due to an adhesion. 2. Large hiatal hernia. 3. Small bilateral pleural effusions and associated mild bibasilar  atelectasis. 4. Extensive atheromatous arterial calcifications, including the coronary arteries. 5. Mild prostatic hypertrophy. 6. Scattered colonic diverticula. Electronically Signed   By: Claudie Revering M.D.   On: 09/09/2018 17:08   Dg Abd 2 Views  Result Date: 09/11/2018 CLINICAL DATA:  Left-sided abdominal pain, constipation. EXAM: ABDOMEN - 2 VIEW COMPARISON:  Radiographs of March 20, 2018. FINDINGS: Dilated small bowel loops are seen in the left side of the abdomen. Moderate amount of stool is noted in the colon. Surgical clips are seen in the left upper quadrant. Vascular calcifications are noted. IMPRESSION: Dilated small bowel loops are seen in the left side of the abdomen concerning for distal small bowel obstruction. Continued radiographic follow-up is recommended. Moderate stool burden is noted. Electronically Signed   By: Marijo Conception, M.D.   On: 09/11/2018 10:42   Dg Abd Portable 1v-small Bowel Protocol-position Verification  Result Date: 09/20/2018 CLINICAL DATA:  Enteric tube placement, follow-up small-bowel obstruction EXAM: PORTABLE ABDOMEN - 1 VIEW COMPARISON:  09/10/2018 CT abdomen/pelvis FINDINGS: Enteric tube terminates in the right lower mediastinum. Diffuse prominent small bowel dilatation throughout the abdomen, not appreciably changed. Enteric contrast is noted within mid pelvic bowel loops. No evidence of pneumatosis or pneumoperitoneum. Surgical clips are noted in the upper abdomen. No radiopaque nephrolithiasis. IMPRESSION: 1. Enteric tube terminates in the right lower mediastinum, either within the lower thoracic esophagus or within the herniated portion of the stomach. 2. No appreciable change in diffuse prominent small bowel dilatation. Enteric contrast is noted within mid pelvic bowel loops, either within pelvic small bowel and/or distal colon (oral contrast was present within the distal colon and rectum on the CT study performed earlier today). Findings suggest stable  partial distal small bowel obstruction. Electronically Signed   By: Ilona Sorrel M.D.   On: 09/19/2018 22:55    Anti-infectives: Anti-infectives (From admission, onward)   None       Assessment/Plan HTN DM HLD Hypothyroidism CKD-III H/o DVT Glaucoma Deaf   SBO - CT scan 9/26 showed distal small bowel obstruction likely due to adhesion; large hiatal hernia - feeling better with NG tube, no flatus or BM  ID - none FEN - IVF, NPO/NGT VTE - SCDs, lovenox Foley - none  Plan: Continue NPO/NGT. Will start small bowel protocol today with gastrograffin and delayed abdominal xray. Encourage OOB/mobilization today. Will consult PT.   LOS: 1 day    Wellington Hampshire , [facility] 09/25/2018, 8:48 AM Pager: [phone] Consults: [phone] Mon 7:00 am -11:30 AM Tues-Fri 7:00 am-4:30 pm Sat-Sun 7:00 am-11:30 am

## 2018-09-25 NOTE — Progress Notes (Signed)
Pt has glaucoma and asking if he can have timolol 1 gtt BID and Lantoprost 1 gtt HS order I paged on call Family medicine.

## 2018-09-25 NOTE — Progress Notes (Signed)
Family Medicine Teaching Service Daily Progress Note Intern Pager: [phone]  Patient name: Philip Matthews Medical record number: [id number] Date of birth: 13-Nov-1928 Age: 84 y.o. Gender: male  Primary Care Provider: Rory Percy, DO Consultants: surgery Code Status: Full code  Pt Overview and Major Events to Date:  Hospital Day 1 Admitted: 09/13/2018  Assessment and Plan: Philip Matthews is a 84 y.o. male presenting with a bowel obstruction. PMH is significant for T2DM, DVT, HTN, peripheral neuropathy, hyperlipidemia, gout, anemia, CKD, and bilateral deafness.  #SBO -  Patient states his abdomen feels much better today and is less distended.  No flatus or BM. Patient not hungry yet. History of hernia repair and colon surgery. Distal SBO and hiatal hernia on CT.  - surgery following - SBO protocol (NGT, gastrograffin, serial xray) - NPO -f/u UA - IV zofran PRN - tylenol PRN pain  #Asymptomatic bacteruria - large LA, > 50 WBC, many bacteria.  0-5 squams.   - do not treat unless patient has symptoms of UTI  #deafness - needs ASL interpreter  #DM-II - takes victoza 0.6mg  qd at home.  - f/u A1c - hold victoza  #CKD-III - 2/2 distal RTA and T2DM. Home lasix 40mg /20mg .  Calcitriol 0.25mcg MWF, sodium Bicarb 650 qd.  - avoid nephrotoxic medications -BMPs - holding medications while NPO  #HTN - takes hydralazine 50mg  TID, lasix. Pharmacy states we could do PRN hydralazine q4 for severely elevated pressures. If we wanted to do lasix IV, use half dose.  - monitor vitals.  - hold meds while NPO  #Hx of hyperkalemia - distal RTA.  Takes lokelma 5mg  daily - BMPs - holding while npo  #anemia of chronic disease - Hgb increased over baseline on this admission. Using procrit 10k unites q2w and ferosul 325mg  daily - CBC  #GERD - protonix 20mg  BID - hold while NPO  # gout - takes allopurinol 100mg  qd - hold while npo  #hypothyroidism - takes 73mcg daily.  Last TSH in august.   Pharmacy recommends doing 1/2 dose IV while NPO.  - restart at 1/2 dose IV  #hyperlipidemia - pravastatin 40mg  at home - hold while NPO  #primary open angle glaucoma - left severe/right mild.  Taking brimonidine-timolol 1 drop BID, latanoprost 1 drop at bedtime.   - continue home meds  #leukocytosis - wbc count minimally elevated to 10.6. Temp 96.8 on admission - monitor cbc  #hx of portal vein thrombosis and DVT - was taking coumadin until march 2019. No signs of thrombosis on physical exam this admission.  - monitor for DVT  Fluids: none  . sodium chloride 125 mL/hr at 09/25/18 0810   Electrolytes:  Replete PRN Nutrition: npo GI ppx: npo DVT px: lovenox  Disposition: home   Medications: Scheduled Meds: . diatrizoate meglumine-sodium  90 mL Per NG tube Once  . enoxaparin (LOVENOX) injection  30 mg Subcutaneous Q24H  . insulin aspart  0-9 Units Subcutaneous Q4H   Continuous Infusions: . sodium chloride 125 mL/hr at 09/25/18 0810   PRN Meds:   ================================================= ================================================= Subjective:  Patient states he feeling much better.  His abdomen is much less distended and firm.  He states he is not hungry yet and has not had BM or passed gas.  He states he has not felt right since his last admission.   Objective: Temp:  [98 F (36.7 C)-98.6 F (37 C)] 98 F (36.7 C) (09/27 4970) Pulse Rate:  [87-97] 88 (09/27 0614) Resp:  [16] 16 (  09/27 4034) BP: (124-177)/(73-106) 131/73 (09/27 7425) SpO2:  [93 %-98 %] 96 % (09/27 9563) Intake/Output 09/26 0701 - 09/27 0700 In: -  Out: 150 [Emesis/NG output:150] Physical Exam:  Gen: NAD, alert, non-toxic, well-nourished, well-appearing, sitting comfortably  CV: Regular rate and rhythm.  Normal S1-S2.  No bilateral lower extremity edema. Resp: Clear to auscultation bilaterally.  No wheezing, rales, abnormal lung sounds.  No increased work of breathing  appreciated. Abd: Bowel sound present.  More active in upper quadrants. Nontender and mildly distended.    Psych: Cooperative with exam. Pleasant. Makes eye contact. Extremities: Full ROM    Laboratory: Recent Labs  Lab 09/13/2018 1131 09/25/18 1010  WBC 10.6* 7.8  HGB 13.5 13.3  HCT 45.0 44.7  PLT 159 164   Recent Labs  Lab 09/23/2018 1131 09/25/18 1010  NA 139 141  K 5.0 5.3*  CL 99 100  CO2 26 30  BUN 72* 83*  CREATININE 2.25* 2.46*  CALCIUM 8.7* 9.0  PROT 7.0  --   BILITOT 0.9  --   ALKPHOS 62  --   ALT 22  --   AST 43*  --   GLUCOSE 215* 156*    Imaging/Diagnostic Tests: Ct Abdomen Pelvis Wo Contrast  Result Date: 09/18/2018 CLINICAL DATA:  Constipation for the past 3 days. EXAM: CT ABDOMEN AND PELVIS WITHOUT CONTRAST TECHNIQUE: Multidetector CT imaging of the abdomen and pelvis was performed following the standard protocol without IV contrast. COMPARISON:  Abdomen pelvis radiographs obtained earlier today. Abdomen ultrasound dated 04/01/2011. FINDINGS: Lower chest: Large hiatal hernia. Small bilateral pleural effusions and associated mild bibasilar atelectasis. Atheromatous coronary artery calcifications. Hepatobiliary: No focal liver abnormality is seen. No gallstones, gallbladder wall thickening, or biliary dilatation. Pancreas: Unremarkable. No pancreatic ductal dilatation or surrounding inflammatory changes. Spleen: Normal in size without focal abnormality. Adrenals/Urinary Tract: Both kidneys are small. Normal appearing adrenal glands, urinary bladder and ureters. Stomach/Bowel: Normal caliber colon containing multiple diverticula. Diffusely dilated small bowel and stomach. Vascular/Lymphatic: Right upper quadrant vascular coils. Extensive atheromatous arterial calcifications. No enlarged lymph nodes. Reproductive: Mildly enlarged prostate gland. Other: Anterior abdominal wall surgical sutures. Small amount of free peritoneal fluid. Small left inguinal hernia containing  fat. Musculoskeletal: Diffuse osteopenia. Lumbar and lower thoracic spine degenerative changes. IMPRESSION: 1. Distal small bowel obstruction. The cause of obstruction is not identified, most likely due to an adhesion. 2. Large hiatal hernia. 3. Small bilateral pleural effusions and associated mild bibasilar atelectasis. 4. Extensive atheromatous arterial calcifications, including the coronary arteries. 5. Mild prostatic hypertrophy. 6. Scattered colonic diverticula. Electronically Signed   By: Claudie Revering M.D.   On: 09/17/2018 17:08   Dg Abd 2 Views  Result Date: 09/18/2018 CLINICAL DATA:  Left-sided abdominal pain, constipation. EXAM: ABDOMEN - 2 VIEW COMPARISON:  Radiographs of March 20, 2018. FINDINGS: Dilated small bowel loops are seen in the left side of the abdomen. Moderate amount of stool is noted in the colon. Surgical clips are seen in the left upper quadrant. Vascular calcifications are noted. IMPRESSION: Dilated small bowel loops are seen in the left side of the abdomen concerning for distal small bowel obstruction. Continued radiographic follow-up is recommended. Moderate stool burden is noted. Electronically Signed   By: Marijo Conception, M.D.   On: 09/16/2018 10:42   Dg Abd Portable 1v-small Bowel Protocol-position Verification  Result Date: 09/27/2018 CLINICAL DATA:  Enteric tube placement, follow-up small-bowel obstruction EXAM: PORTABLE ABDOMEN - 1 VIEW COMPARISON:  09/13/2018 CT abdomen/pelvis FINDINGS: Enteric tube terminates  in the right lower mediastinum. Diffuse prominent small bowel dilatation throughout the abdomen, not appreciably changed. Enteric contrast is noted within mid pelvic bowel loops. No evidence of pneumatosis or pneumoperitoneum. Surgical clips are noted in the upper abdomen. No radiopaque nephrolithiasis. IMPRESSION: 1. Enteric tube terminates in the right lower mediastinum, either within the lower thoracic esophagus or within the herniated portion of the stomach. 2.  No appreciable change in diffuse prominent small bowel dilatation. Enteric contrast is noted within mid pelvic bowel loops, either within pelvic small bowel and/or distal colon (oral contrast was present within the distal colon and rectum on the CT study performed earlier today). Findings suggest stable partial distal small bowel obstruction. Electronically Signed   By: Ilona Sorrel M.D.   On: 09/15/2018 22:55      Benay Pike, MD 09/25/2018, 11:27 AM PGY-1, [place] Intern pager: [phone], text pages welcome

## 2018-09-25 NOTE — Progress Notes (Signed)
Pt gastrographin given clamp for an hour informed Radiology.

## 2018-09-25 NOTE — Progress Notes (Signed)
Patient arrived via stretcher with assist of one transport and ASL interpreter present. Oriented patient to room, helped get subtitles on TV and made patient aware of [facility] policies. Call light in reach and bed in lowest position.

## 2018-09-26 ENCOUNTER — Other Ambulatory Visit: Payer: Self-pay

## 2018-09-26 ENCOUNTER — Inpatient Hospital Stay (HOSPITAL_COMMUNITY): Payer: Medicare Other

## 2018-09-26 DIAGNOSIS — N183 Chronic kidney disease, stage 3 (moderate): Secondary | ICD-10-CM

## 2018-09-26 DIAGNOSIS — D631 Anemia in chronic kidney disease: Secondary | ICD-10-CM

## 2018-09-26 LAB — GLUCOSE, CAPILLARY
GLUCOSE-CAPILLARY: 101 mg/dL — AB (ref 70–99)
GLUCOSE-CAPILLARY: 117 mg/dL — AB (ref 70–99)
GLUCOSE-CAPILLARY: 118 mg/dL — AB (ref 70–99)
Glucose-Capillary: 119 mg/dL — ABNORMAL HIGH (ref 70–99)
Glucose-Capillary: 106 mg/dL — ABNORMAL HIGH (ref 70–99)
Glucose-Capillary: 110 mg/dL — ABNORMAL HIGH (ref 70–99)

## 2018-09-26 LAB — BASIC METABOLIC PANEL
ANION GAP: 13 (ref 5–15)
BUN: 87 mg/dL — AB (ref 8–23)
CALCIUM: 8.3 mg/dL — AB (ref 8.9–10.3)
CO2: 25 mmol/L (ref 22–32)
Chloride: 101 mmol/L (ref 98–111)
Creatinine, Ser: 2.36 mg/dL — ABNORMAL HIGH (ref 0.61–1.24)
GFR calc Af Amer: 27 mL/min — ABNORMAL LOW (ref >60)
GFR calc non Af Amer: 23 mL/min — ABNORMAL LOW (ref >60)
GLUCOSE: 120 mg/dL — AB (ref 70–99)
Potassium: 4.1 mmol/L (ref 3.5–5.1)
Sodium: 139 mmol/L (ref 135–145)

## 2018-09-26 MED ORDER — MAGIC MOUTHWASH: 15.0000 mL | Freq: Four times a day (QID) | ORAL | Status: DC | PRN

## 2018-09-26 MED ORDER — BISACODYL 10 MG RE SUPP
10.0000 mg | Freq: Every day | RECTAL | Status: DC
  Administered 2018-09-26 – 2018-09-29 (×4): 10 mg via RECTAL
  Filled 2018-09-26 (×4): qty 1

## 2018-09-26 MED ORDER — MENTHOL 3 MG MT LOZG
1.0000 | LOZENGE | OROMUCOSAL | Status: DC | PRN
  Filled 2018-09-26: qty 9

## 2018-09-26 MED ORDER — ALUM & MAG HYDROXIDE-SIMETH 200-200-20 MG/5ML PO SUSP
30.0000 mL | Freq: Four times a day (QID) | ORAL | Status: DC | PRN
  Filled 2018-09-26: qty 30

## 2018-09-26 MED ORDER — FAMOTIDINE IN NACL 20-0.9 MG/50ML-% IV SOLN
20.0000 mg | INTRAVENOUS | Status: DC
  Administered 2018-09-26 – 2018-09-29 (×4): 20 mg via INTRAVENOUS
  Filled 2018-09-26 (×4): qty 50

## 2018-09-26 MED ORDER — LIP MEDEX EX OINT
1.0000 "application " | TOPICAL_OINTMENT | Freq: Two times a day (BID) | CUTANEOUS | Status: DC
  Administered 2018-09-26 – 2018-09-30 (×7): 1 via TOPICAL
  Filled 2018-09-26 (×2): qty 7

## 2018-09-26 MED ORDER — METHOCARBAMOL 1000 MG/10ML IJ SOLN
1000.0000 mg | Freq: Four times a day (QID) | INTRAVENOUS | Status: DC | PRN
  Filled 2018-09-26: qty 10

## 2018-09-26 MED ORDER — PHENOL 1.4 % MT LIQD
1.0000 | OROMUCOSAL | Status: DC | PRN
  Administered 2018-09-28: 2 via OROMUCOSAL
  Filled 2018-09-26: qty 177

## 2018-09-26 MED ORDER — HYDROCORTISONE 1 % EX CREA
1.0000 "application " | TOPICAL_CREAM | Freq: Three times a day (TID) | CUTANEOUS | Status: DC | PRN
  Filled 2018-09-26: qty 28

## 2018-09-26 MED ORDER — HYDROCORTISONE 2.5 % RE CREA
1.0000 "application " | TOPICAL_CREAM | Freq: Four times a day (QID) | RECTAL | Status: DC | PRN
  Filled 2018-09-26: qty 28.35

## 2018-09-26 MED ORDER — GUAIFENESIN-DM 100-10 MG/5ML PO SYRP
10.0000 mL | ORAL_SOLUTION | ORAL | Status: DC | PRN
  Filled 2018-09-26: qty 10

## 2018-09-26 NOTE — Progress Notes (Signed)
Family Medicine Teaching Service Daily Progress Note Intern Pager: [phone]  Patient name: Philip Matthews Medical record number: [id number] Date of birth: 1928-04-25 Age: 84 y.o. Gender: male  Primary Care Provider: Rory Percy, DO Consultants: surgery,  Code Status: Full code  Pt Overview and Major Events to Date:  Hospital Day 2 Admitted: 09/07/2018   Assessment and Plan: Philip Cory Allenis a 84 y.o.malepresenting with a bowel obstruction. PMH is significant forT2DM, DVT, HTN,peripheral neuropathy, hyperlipidemia, gout, anemia, CKD, and bilateral deafness.  #SBO -  right side of abdomen feels better, but left side is still TTP. No flatus or BM. Patient is hungry. History of hernia repair and colon surgery. Distal SBO and hiatal hernia on CT.  he is a candidate for surgery if necessary but hopeful for non-surgical recovery.  - surgery following - SBO protocol (NGT, gastrograffin, serial xray). Will get 24 hr Xray today.  - NPO -f/u UA - IV zofran PRN - tylenol PRN pain  #Asymptomatic bacteruria - large LA, > 50 WBC, many bacteria.  0-5 squams.   - do not treat unless patient has symptoms of UTI  #deafness - needs ASL interpreter  #DM-II - takes victoza 0.6mg  qd at home. Glucose lebels 100-140 so far.  - f/u A1c - hold victoza  #CKD-IV - 2/2 distal RTA and T2DM. Home lasix 40mg /20mg .  Calcitriol 0.49mcg MWF, sodium Bicarb 650 qd. Creatinine improving and K wnl. BUN increasing to 80  - avoid nephrotoxic medications - nephro consulted -monitor BMPs - holding medications while NPO  #HTN - takes hydralazine 50mg  TID, lasix. Pharmacy states we could do PRN hydralazine q4 for severely elevated pressures. If we wanted to do lasix IV, use half dose.  - monitor vitals.  - hold meds while NPO  #Hx of hyperkalemia - distal RTA.  Takes lokelma 5mg  daily.  Currently 4.1 this AM.  - BMPs - holding while npo  #anemia of chronic disease - Hgb increased over baseline on  this admission. Using procrit 10k unites q2w and ferosul 325mg  daily - CBC daily  #GERD - patient feeling a burning in his throat today.  Was on protonix 20mg  BID at home - starting IV pepcid.   # gout - takes allopurinol 100mg  qd - hold while npo  #hypothyroidism - takes 27mcg daily.  Last TSH in august.  Pharmacy recommends doing 1/2 dose IV while NPO.  - restart at 1/2 dose IV  #hyperlipidemia - pravastatin 40mg  at home - hold while NPO  #primary open angle glaucoma - left severe/right mild.  Taking brimonidine-timolol 1 drop BID, latanoprost 1 drop at bedtime.   - continue home meds  #leukocytosis - wbc count minimally elevated to 10.6. Temp 96.8 on admission - monitor cbc  #hx of portal vein thrombosis and DVT - was taking coumadin until march 2019. No signs of thrombosis on physical exam this admission.  - monitor for DVT  Fluids:  . sodium chloride 75 mL/hr at 09/26/18 1005  . famotidine (PEPCID) IV 20 mg (09/26/18 1306)  . methocarbamol (ROBAXIN) IV     Electrolytes: replete PRN  Nutrition: NPO GI ppx: IV pepcid DVT px: lovenox 30mg   Disposition: home   Medications: Scheduled Meds: . bisacodyl  10 mg Rectal Daily  . brimonidine  1 drop Both Eyes Q12H   And  . timolol  1 drop Both Eyes Q12H  . enoxaparin (LOVENOX) injection  30 mg Subcutaneous Q24H  . insulin aspart  0-9 Units Subcutaneous Q4H  . latanoprost  1 drop Both Eyes QHS  . levothyroxine  37.5 mcg Intravenous Daily  . lip balm  1 application Topical BID   Continuous Infusions: . sodium chloride 75 mL/hr at 09/26/18 1005  . famotidine (PEPCID) IV 20 mg (09/26/18 1306)  . methocarbamol (ROBAXIN) IV     PRN Meds:   ================================================= ================================================= Subjective:  Spoke with patient and pt daughter through interpreter.  Dr. Johney Maine from Surgery was also present. Patient states he is having burning sensation in his throat.  The  left side of his abdomen is still tender and distended.  The right side feels better.  He has not passed gas.  He is feeling hungry.  Dr. Johney Maine informed patient he would be getting another xray today and that he would like the sbo to resolve on its own without surgery, but that it may be necessary.  Patient was told that walking around will help resolve the obstruction.   Objective: Temp:  [98.4 F (36.9 C)-98.8 F (37.1 C)] 98.4 F (36.9 C) (09/28 0432) Pulse Rate:  [80-95] 80 (09/28 0432) Resp:  [16-19] 19 (09/28 0432) BP: (149-173)/(90-91) 149/91 (09/28 0432) SpO2:  [97 %-100 %] 100 % (09/28 0432) Intake/Output 09/27 0701 - 09/28 0700 In: 1657.9 [I.V.:1537.9; NG/GT:120] Out: 400 [Emesis/NG output:400] Physical Exam:  Gen: NAD, alert, non-toxic, well-nourished, well-appearing, sitting comfortably  HEENT: Normocephaic, atraumatic. Clear conjuctiva, no scleral icterus and injection.  CV: Regular rate and rhythm.  Normal S1-S2.    No bilateral lower extremity edema. Resp: Clear to auscultation bilaterally.  No wheezing, rales, abnormal lung sounds.  No increased work of breathing appreciated. Abd: LUQ is more firm and distended than rest of abdomen and tender to palpation.  Other quadrants nontender. Positive bowel sounds. Psych: Cooperative with exam. Pleasant. Makes eye contact.   Laboratory: Recent Labs  Lab 09/18/2018 1131 09/25/18 1010  WBC 10.6* 7.8  HGB 13.5 13.3  HCT 45.0 44.7  PLT 159 164   Recent Labs  Lab 09/17/2018 1131 09/25/18 1010 09/26/18 0513  NA 139 141 139  K 5.0 5.3* 4.1  CL 99 100 101  CO2 26 30 25   BUN 72* 83* 87*  CREATININE 2.25* 2.46* 2.36*  CALCIUM 8.7* 9.0 8.3*  PROT 7.0  --   --   BILITOT 0.9  --   --   ALKPHOS 62  --   --   ALT 22  --   --   AST 43*  --   --   GLUCOSE 215* 156* 120*    Imaging/Diagnostic Tests: Ct Abdomen Pelvis Wo Contrast  Result Date: 09/18/2018 CLINICAL DATA:  Constipation for the past 3 days. EXAM: CT ABDOMEN AND  PELVIS WITHOUT CONTRAST TECHNIQUE: Multidetector CT imaging of the abdomen and pelvis was performed following the standard protocol without IV contrast. COMPARISON:  Abdomen pelvis radiographs obtained earlier today. Abdomen ultrasound dated 04/01/2011. FINDINGS: Lower chest: Large hiatal hernia. Small bilateral pleural effusions and associated mild bibasilar atelectasis. Atheromatous coronary artery calcifications. Hepatobiliary: No focal liver abnormality is seen. No gallstones, gallbladder wall thickening, or biliary dilatation. Pancreas: Unremarkable. No pancreatic ductal dilatation or surrounding inflammatory changes. Spleen: Normal in size without focal abnormality. Adrenals/Urinary Tract: Both kidneys are small. Normal appearing adrenal glands, urinary bladder and ureters. Stomach/Bowel: Normal caliber colon containing multiple diverticula. Diffusely dilated small bowel and stomach. Vascular/Lymphatic: Right upper quadrant vascular coils. Extensive atheromatous arterial calcifications. No enlarged lymph nodes. Reproductive: Mildly enlarged prostate gland. Other: Anterior abdominal wall surgical sutures. Small amount of free peritoneal  fluid. Small left inguinal hernia containing fat. Musculoskeletal: Diffuse osteopenia. Lumbar and lower thoracic spine degenerative changes. IMPRESSION: 1. Distal small bowel obstruction. The cause of obstruction is not identified, most likely due to an adhesion. 2. Large hiatal hernia. 3. Small bilateral pleural effusions and associated mild bibasilar atelectasis. 4. Extensive atheromatous arterial calcifications, including the coronary arteries. 5. Mild prostatic hypertrophy. 6. Scattered colonic diverticula. Electronically Signed   By: Claudie Revering M.D.   On: 09/10/2018 17:08   Dg Abd Portable 1v-small Bowel Obstruction Protocol-initial, 8 Hr Delay  Result Date: 09/26/2018 CLINICAL DATA:  SBO, 8 hour delay EXAM: PORTABLE ABDOMEN - 1 VIEW COMPARISON:  09/07/2018 FINDINGS:  Enteric tube overlies the right lower mediastinum, likely within the patient's hiatal hernia when correlating with CT. Multiple dilated loops of small bowel in the left mid abdomen. Contrast opacifies distal small bowel in the right lower quadrant, possibly extending to the cecum. Moderate stool in the right colon. Surgical clips in the upper abdomen. Degenerative changes of the lumbar spine. IMPRESSION: Multiple dilated loops of small bowel in the left mid abdomen, compatible with small bowel obstruction. Contrast opacifies distal small bowel in the right lower quadrant, possibly extending to the cecum. Electronically Signed   By: Julian Hy M.D.   On: 09/26/2018 02:50   Dg Abd Portable 1v-small Bowel Protocol-position Verification  Result Date: 09/15/2018 CLINICAL DATA:  Enteric tube placement, follow-up small-bowel obstruction EXAM: PORTABLE ABDOMEN - 1 VIEW COMPARISON:  09/08/2018 CT abdomen/pelvis FINDINGS: Enteric tube terminates in the right lower mediastinum. Diffuse prominent small bowel dilatation throughout the abdomen, not appreciably changed. Enteric contrast is noted within mid pelvic bowel loops. No evidence of pneumatosis or pneumoperitoneum. Surgical clips are noted in the upper abdomen. No radiopaque nephrolithiasis. IMPRESSION: 1. Enteric tube terminates in the right lower mediastinum, either within the lower thoracic esophagus or within the herniated portion of the stomach. 2. No appreciable change in diffuse prominent small bowel dilatation. Enteric contrast is noted within mid pelvic bowel loops, either within pelvic small bowel and/or distal colon (oral contrast was present within the distal colon and rectum on the CT study performed earlier today). Findings suggest stable partial distal small bowel obstruction. Electronically Signed   By: Ilona Sorrel M.D.   On: 09/09/2018 22:55      Benay Pike, MD 09/26/2018, 2:00 PM PGY-1, [place] Intern  pager: [phone], text pages welcome

## 2018-09-26 NOTE — Progress Notes (Signed)
Philip Matthews [id number] [birth year]  CARE TEAM:  PCP: Rory Percy, DO  Outpatient Care Team: Patient Care Team: Rory Percy, DO as PCP - General (Family Medicine) Kennith Center, RD as Dietitian (Family Medicine)  Inpatient Treatment Team: Treatment Team: Attending Provider: Lind Covert, MD; Consulting Physician: Edison Pace, Md, MD; Technician: Gasper Sells, [place]; Registered Nurse: Dorian Pod, RN; Technician: Raenette Rover, NT; Respiratory Therapist: Romeo Apple, RRT; Registered Nurse: Charisse March, RN; Technician: Rolan Bucco, NT; Registered Nurse: Willa Frater, RN; Physical Therapist: Carney Living, PT   Problem List:   Active Problems:   Small bowel obstruction [facility])   Generalized abdominal pain      * No surgery found [facility] Stay = 2 days  Assessment  SBO possibly resolving  Plan:  -cont SBO protocol with distention & equivocal Xrays -interpreter at bedside -VTE prophylaxis- SCDs, etc -mobilize as tolerated to help recovery  20 minutes spent in review, evaluation, examination, counseling, and coordination of care.  More than 50% of that time was spent in counseling.  09/26/2018    Subjective: (Chief complaint)  Feels better Daughter, deaf interpreter & Dr Irish Elders in room w me   Objective:  Vital signs:  Vitals:   09/25/18 1337 09/25/18 2100 09/26/18 0039 09/26/18 0432  BP: (!) 141/75 (!) 173/90  (!) 149/91  Pulse: 79 95  80  Resp: '17 16 18 19  ' Temp: 97.8 F (36.6 C) 98.8 F (37.1 C)  98.4 F (36.9 C)  TempSrc: Oral Oral  Oral  SpO2: 100% 97%  100%    Last BM Date: 09/21/18  Intake/Output   Yesterday:  09/27 0701 - 09/28 0700 In: 1657.9 [I.V.:1537.9; NG/GT:120] Out: 400 [Emesis/NG output:400] This shift:  Total I/O In: -  Out: 100 [Urine:100]  Bowel function:  Flatus: No  BM:  No  Drain: Bilious NGT   Physical Exam:  General: Pt awake/alert/oriented x4 in no acute  distress Eyes: PERRL, normal EOM.  Sclera clear.  No icterus Neuro: CN II-XII intact w/o focal sensory/motor deficits. Lymph: No head/neck/groin lymphadenopathy Psych:  No delerium/psychosis/paranoia HENT: Normocephalic, Mucus membranes moist.  No thrush Neck: Supple, No tracheal deviation Chest:  No chest wall pain w good excursion CV:  Pulses intact.  Regular rhythm MS: Normal AROM mjr joints.  No obvious deformity  Abdomen: Soft. Doughty w SB loops felt.   Moderately distended.  Nontender.  No evidence of peritonitis.  No incarcerated hernias.  Ext:  No deformity.  No mjr edema.  No cyanosis Skin: No petechiae / purpura  Results:   Labs: Results for orders placed or performed during the hospital encounter of 08/31/2018 (from the past 48 hour(s))  Lipase, blood     Status: None   Collection Time: 09/04/2018 11:31 AM  Result Value Ref Range   Lipase 35 11 - 51 U/L    Comment: Performed at [facility], [place] [address] [place], [place] [address]  Comprehensive metabolic panel     Status: Abnormal   Collection Time: 09/09/2018 11:31 AM  Result Value Ref Range   Sodium 139 135 - 145 mmol/L   Potassium 5.0 3.5 - 5.1 mmol/L   Chloride 99 98 - 111 mmol/L   CO2 26 22 - 32 mmol/L   Glucose, Bld 215 (H) 70 - 99 mg/dL   BUN 72 (H) 8 - 23 mg/dL   Creatinine, Ser 2.25 (H) 0.61 - 1.24 mg/dL  Calcium 8.7 (L) 8.9 - 10.3 mg/dL   Total Protein 7.0 6.5 - 8.1 g/dL   Albumin 3.7 3.5 - 5.0 g/dL   AST 43 (H) 15 - 41 U/L   ALT 22 0 - 44 U/L   Alkaline Phosphatase 62 38 - 126 U/L   Total Bilirubin 0.9 0.3 - 1.2 mg/dL   GFR calc non Af Amer 25 (L) >60 mL/min   GFR calc Af Amer 28 (L) >60 mL/min    Comment: (NOTE) The eGFR has been calculated using the CKD EPI equation. This calculation has not been validated in all clinical situations. eGFR's persistently <60 mL/min signify possible Chronic Kidney Disease.    Anion gap 14 5 - 15    Comment: Performed at [facility]  [address] [place], [place] [address]  CBC     Status: Abnormal   Collection Time: 09/28/2018 11:31 AM  Result Value Ref Range   WBC 10.6 (H) 4.0 - 10.5 K/uL   RBC 4.96 4.22 - 5.81 MIL/uL   Hemoglobin 13.5 13.0 - 17.0 g/dL   HCT 45.0 39.0 - 52.0 %   MCV 90.7 78.0 - 100.0 fL   MCH 27.2 26.0 - 34.0 pg   MCHC 30.0 30.0 - 36.0 g/dL   RDW 17.4 (H) 11.5 - 15.5 %   Platelets 159 150 - 400 K/uL    Comment: Performed at [facility], [place] [address], [place], [place] [address]  Glucose, capillary     Status: Abnormal   Collection Time: 09/25/18  2:22 AM  Result Value Ref Range   Glucose-Capillary 132 (H) 70 - 99 mg/dL  Glucose, capillary     Status: Abnormal   Collection Time: 09/25/18  5:45 AM  Result Value Ref Range   Glucose-Capillary 137 (H) 70 - 99 mg/dL  Glucose, capillary     Status: Abnormal   Collection Time: 09/25/18  7:55 AM  Result Value Ref Range   Glucose-Capillary 136 (H) 70 - 99 mg/dL   Comment 1 Notify RN   Basic metabolic panel     Status: Abnormal   Collection Time: 09/25/18 10:10 AM  Result Value Ref Range   Sodium 141 135 - 145 mmol/L   Potassium 5.3 (H) 3.5 - 5.1 mmol/L   Chloride 100 98 - 111 mmol/L   CO2 30 22 - 32 mmol/L   Glucose, Bld 156 (H) 70 - 99 mg/dL   BUN 83 (H) 8 - 23 mg/dL   Creatinine, Ser 2.46 (H) 0.61 - 1.24 mg/dL   Calcium 9.0 8.9 - 10.3 mg/dL   GFR calc non Af Amer 22 (L) >60 mL/min   GFR calc Af Amer 26 (L) >60 mL/min    Comment: (NOTE) The eGFR has been calculated using the CKD EPI equation. This calculation has not been validated in all clinical situations. eGFR's persistently <60 mL/min signify possible Chronic Kidney Disease.    Anion gap 11 5 - 15    Comment: Performed at [facility] [address] [place] [address]  CBC     Status: Abnormal   Collection Time: 09/25/18 10:10 AM  Result Value Ref Range   WBC 7.8 4.0 - 10.5 K/uL   RBC 4.93 4.22 - 5.81 MIL/uL   Hemoglobin 13.3 13.0 - 17.0 g/dL   HCT 44.7 39.0 - 52.0  %   MCV 90.7 78.0 - 100.0 fL   MCH 27.0 26.0 - 34.0 pg   MCHC 29.8 (L) 30.0 -  36.0 g/dL   RDW 17.3 (H) 11.5 - 15.5 %   Platelets 164 150 - 400 K/uL    Comment: Performed at [facility], [place] [address], [place], [place] [address]  Hemoglobin A1c     Status: Abnormal   Collection Time: 09/25/18 10:10 AM  Result Value Ref Range   Hgb A1c MFr Bld 6.8 (H) 4.8 - 5.6 %    Comment: (NOTE) Pre diabetes:          5.7%-6.4% Diabetes:              >6.4% Glycemic control for   <7.0% adults with diabetes    Mean Plasma Glucose 148.46 mg/dL    Comment: Performed at [facility] [address] [place], [place] [address]  Urinalysis, Routine w reflex microscopic     Status: Abnormal   Collection Time: 09/25/18 10:42 AM  Result Value Ref Range   Color, Urine YELLOW YELLOW   APPearance CLOUDY (A) CLEAR   Specific Gravity, Urine 1.014 1.005 - 1.030   pH 5.0 5.0 - 8.0   Glucose, UA NEGATIVE NEGATIVE mg/dL   Hgb urine dipstick SMALL (A) NEGATIVE   Bilirubin Urine NEGATIVE NEGATIVE   Ketones, ur NEGATIVE NEGATIVE mg/dL   Protein, ur 30 (A) NEGATIVE mg/dL   Nitrite NEGATIVE NEGATIVE   Leukocytes, UA LARGE (A) NEGATIVE   RBC / HPF 6-10 0 - 5 RBC/hpf   WBC, UA >50 (H) 0 - 5 WBC/hpf   Bacteria, UA MANY (A) NONE SEEN   Squamous Epithelial / LPF 0-5 0 - 5   Mucus PRESENT     Comment: Performed at [facility], [place] [address] [place], [place] [address]  Glucose, capillary     Status: Abnormal   Collection Time: 09/25/18 12:31 PM  Result Value Ref Range   Glucose-Capillary 113 (H) 70 - 99 mg/dL   Comment 1 Notify RN   Glucose, capillary     Status: Abnormal   Collection Time: 09/25/18  4:02 PM  Result Value Ref Range   Glucose-Capillary 112 (H) 70 - 99 mg/dL   Comment 1 Notify RN   Glucose, capillary     Status: Abnormal   Collection Time: 09/25/18  8:55 PM  Result Value Ref Range   Glucose-Capillary 136 (H) 70 - 99 mg/dL  Glucose, capillary     Status: Abnormal    Collection Time: 09/25/18 11:59 PM  Result Value Ref Range   Glucose-Capillary 118 (H) 70 - 99 mg/dL   Comment 1 Notify RN    Comment 2 Document in Chart   Glucose, capillary     Status: Abnormal   Collection Time: 09/26/18  4:27 AM  Result Value Ref Range   Glucose-Capillary 119 (H) 70 - 99 mg/dL   Comment 1 Notify RN    Comment 2 Document in Chart   Basic metabolic panel     Status: Abnormal   Collection Time: 09/26/18  5:13 AM  Result Value Ref Range   Sodium 139 135 - 145 mmol/L   Potassium 4.1 3.5 - 5.1 mmol/L   Chloride 101 98 - 111 mmol/L   CO2 25 22 - 32 mmol/L   Glucose, Bld 120 (H) 70 - 99 mg/dL   BUN 87 (H) 8 - 23 mg/dL   Creatinine, Ser 2.36 (H) 0.61 - 1.24 mg/dL   Calcium 8.3 (L) 8.9 - 10.3 mg/dL   GFR calc non Af Amer 23 (L) >60 mL/min   GFR  calc Af Amer 27 (L) >60 mL/min    Comment: (NOTE) The eGFR has been calculated using the CKD EPI equation. This calculation has not been validated in all clinical situations. eGFR's persistently <60 mL/min signify possible Chronic Kidney Disease.    Anion gap 13 5 - 15    Comment: Performed at [facility] [address] [place], [place] [address]  Glucose, capillary     Status: Abnormal   Collection Time: 09/26/18  7:57 AM  Result Value Ref Range   Glucose-Capillary 117 (H) 70 - 99 mg/dL    Imaging / Studies: Ct Abdomen Pelvis Wo Contrast  Result Date: 09/06/2018 CLINICAL DATA:  Constipation for the past 3 days. EXAM: CT ABDOMEN AND PELVIS WITHOUT CONTRAST TECHNIQUE: Multidetector CT imaging of the abdomen and pelvis was performed following the standard protocol without IV contrast. COMPARISON:  Abdomen pelvis radiographs obtained earlier today. Abdomen ultrasound dated 04/01/2011. FINDINGS: Lower chest: Large hiatal hernia. Small bilateral pleural effusions and associated mild bibasilar atelectasis. Atheromatous coronary artery calcifications. Hepatobiliary: No focal liver abnormality is seen. No gallstones,  gallbladder wall thickening, or biliary dilatation. Pancreas: Unremarkable. No pancreatic ductal dilatation or surrounding inflammatory changes. Spleen: Normal in size without focal abnormality. Adrenals/Urinary Tract: Both kidneys are small. Normal appearing adrenal glands, urinary bladder and ureters. Stomach/Bowel: Normal caliber colon containing multiple diverticula. Diffusely dilated small bowel and stomach. Vascular/Lymphatic: Right upper quadrant vascular coils. Extensive atheromatous arterial calcifications. No enlarged lymph nodes. Reproductive: Mildly enlarged prostate gland. Other: Anterior abdominal wall surgical sutures. Small amount of free peritoneal fluid. Small left inguinal hernia containing fat. Musculoskeletal: Diffuse osteopenia. Lumbar and lower thoracic spine degenerative changes. IMPRESSION: 1. Distal small bowel obstruction. The cause of obstruction is not identified, most likely due to an adhesion. 2. Large hiatal hernia. 3. Small bilateral pleural effusions and associated mild bibasilar atelectasis. 4. Extensive atheromatous arterial calcifications, including the coronary arteries. 5. Mild prostatic hypertrophy. 6. Scattered colonic diverticula. Electronically Signed   By: Claudie Revering M.D.   On: 09/21/2018 17:08   Dg Abd 2 Views  Result Date: 09/11/2018 CLINICAL DATA:  Left-sided abdominal pain, constipation. EXAM: ABDOMEN - 2 VIEW COMPARISON:  Radiographs of March 20, 2018. FINDINGS: Dilated small bowel loops are seen in the left side of the abdomen. Moderate amount of stool is noted in the colon. Surgical clips are seen in the left upper quadrant. Vascular calcifications are noted. IMPRESSION: Dilated small bowel loops are seen in the left side of the abdomen concerning for distal small bowel obstruction. Continued radiographic follow-up is recommended. Moderate stool burden is noted. Electronically Signed   By: Marijo Conception, M.D.   On: 09/13/2018 10:42   Dg Abd Portable  1v-small Bowel Obstruction Protocol-initial, 8 Hr Delay  Result Date: 09/26/2018 CLINICAL DATA:  SBO, 8 hour delay EXAM: PORTABLE ABDOMEN - 1 VIEW COMPARISON:  09/07/2018 FINDINGS: Enteric tube overlies the right lower mediastinum, likely within the patient's hiatal hernia when correlating with CT. Multiple dilated loops of small bowel in the left mid abdomen. Contrast opacifies distal small bowel in the right lower quadrant, possibly extending to the cecum. Moderate stool in the right colon. Surgical clips in the upper abdomen. Degenerative changes of the lumbar spine. IMPRESSION: Multiple dilated loops of small bowel in the left mid abdomen, compatible with small bowel obstruction. Contrast opacifies distal small bowel in the right lower quadrant, possibly extending to the cecum. Electronically Signed   By: Julian Hy M.D.   On: 09/26/2018 02:50  Dg Abd Portable 1v-small Bowel Protocol-position Verification  Result Date: 09/07/2018 CLINICAL DATA:  Enteric tube placement, follow-up small-bowel obstruction EXAM: PORTABLE ABDOMEN - 1 VIEW COMPARISON:  09/05/2018 CT abdomen/pelvis FINDINGS: Enteric tube terminates in the right lower mediastinum. Diffuse prominent small bowel dilatation throughout the abdomen, not appreciably changed. Enteric contrast is noted within mid pelvic bowel loops. No evidence of pneumatosis or pneumoperitoneum. Surgical clips are noted in the upper abdomen. No radiopaque nephrolithiasis. IMPRESSION: 1. Enteric tube terminates in the right lower mediastinum, either within the lower thoracic esophagus or within the herniated portion of the stomach. 2. No appreciable change in diffuse prominent small bowel dilatation. Enteric contrast is noted within mid pelvic bowel loops, either within pelvic small bowel and/or distal colon (oral contrast was present within the distal colon and rectum on the CT study performed earlier today). Findings suggest stable partial distal small bowel  obstruction. Electronically Signed   By: Ilona Sorrel M.D.   On: 08/30/2018 22:55    Medications / Allergies: per chart  Antibiotics: Anti-infectives (From admission, onward)   None        Note: Portions of this report may have been transcribed using voice recognition software. Every effort was made to ensure accuracy; however, inadvertent computerized transcription errors may be present.   Any transcriptional errors that result from this process are unintentional.     Adin Hector, MD, FACS, MASCRS Gastrointestinal and Minimally Invasive Surgery    [address], [place], [place] [address] [phone] Main / Paging [phone] Fax

## 2018-09-26 NOTE — Consult Note (Signed)
Referring Provider: No ref. provider found Primary Care Physician:  Rory Percy, DO Primary Nephrologist:  Dr. Posey Pronto  Reason for Consultation: Chronic renal disease stage III/IV Baseline creatinine 2-2.2.  Diabetic, hypertension  HPI: An 84 year old gentleman who presents with a small bowel obstruction, he has been seen by surgery he has a history of a remote history of colon surgery in the 1960s 1970s.  It is unclear as to what happened.  He underwent a hernia repair in the past.  He is started on IV fluids D5 half normal saline at 75 cc an hour.  He had an NG tube placed Gastrografin studies and serial x-rays ordered by general surgery.  Blood pressure 141/79 afebrile pulse 70 O2 sats 100% room air urine output 100 cc input 2355 cc.  CT abdomen pelvis distal small bowel obstruction left hiatal hernia small pleural effusions bibasilar atelectasis atheromatous calcifications of the coronary arteries and mild prostate hypertrophy.  09/17/2018 Abdominal x-ray dilated loops of small bowel and right lower quadrant 09/26/2018  Labs sodium 139 potassium 4.1 chloride 101 CO2 25 glucose 120 BUN 87 creatinine 2.36 calcium 8.3 hemoglobin 13.3 with WBC 7.8 platelets 164   Past Medical History:  Diagnosis Date  . Anemia   . Arthritis    "joints" (08/27/2017)  . ARTHRITIS, LEFT SHOULDER 02/01/2010   Qualifier: Diagnosis of  By: Jacques Earthly    . Chronic kidney disease (CKD), stage IV (severe) ([place])   . Deaf   . Diverticulosis   . DIVERTICULOSIS OF COLON 03/08/2010   Qualifier: Diagnosis of  By: Jacques Earthly    . Esophagitis    HSV  . Gout   . History of DVT (deep vein thrombosis)   . Hyperlipidemia   . Hypertension   . Hypothyroidism   . Kidney mass    lipoma  . Liver nodule    u/s 310  . Portal vein thrombosis    on life long coumadin  . PUD, HX OF 03/08/2010   Qualifier: History of  By: Jacques Earthly    . Type II diabetes mellitus ([place])     Past Surgical History:   Procedure Laterality Date  . COLON SURGERY     "something was twisted; that messed w/my BM; had to have it untwisted"  . HERNIA REPAIR    . LIVER LOBECTOMY    . SHOULDER ARTHROCENTESIS Left     Prior to Admission medications   Medication Sig Start Date End Date Taking? Authorizing Provider  acetaminophen (TYLENOL) 500 MG tablet Take 500 mg by mouth every 6 (six) hours as needed (pain).   Yes [provider]  allopurinol (ZYLOPRIM) 100 MG tablet take 1 tablet (100 mg) by mouth once daily 08/18/18  Yes Rumball, Alison, DO  brimonidine-timolol (COMBIGAN) 0.2-0.5 % ophthalmic solution Place 1 drop into both eyes every 12 (twelve) hours.   Yes [provider]  calcitRIOL (ROCALTROL) 0.25 MCG capsule Take 1 capsule (0.25 mcg total) by mouth every Monday, Wednesday, and Friday. 04/22/18  Yes Mayo, Pete Pelt, MD  calcium-vitamin D (OSCAL-500) 500-400 MG-UNIT tablet TAKE 1 TABLET BY MOUTH 2 TIMES DAILY Patient taking differently: Take 1 tablet by mouth 2 (two) times daily.  09/10/18  Yes Tammi Klippel, Sherin, DO  docusate sodium (COLACE) 100 MG capsule Take 1 capsule (100 mg total) by mouth 2 (two) times daily as needed (constipation). Over the counter 04/21/18  Yes Mayo, Pete Pelt, MD  epoetin alfa (PROCRIT) 86767 UNIT/ML injection Inject 1 mL (10,000 Units  total) into the skin 3 (three) times a week. Patient taking differently: Inject 10,000 Units into the skin every 14 (fourteen) days.  09/24/11  Yes Hulan Saas M, DO  FEROSUL 325 (65 Fe) MG tablet TAKE 1 TABLET BY MOUTH EVERY DAY Patient taking differently: Take 325 mg by mouth daily.  09/10/18  Yes Tammi Klippel, Sherin, DO  furosemide (LASIX) 20 MG tablet Please take 40mg  (2 tablets) in the morning and 20mg  at bedtime (1 tablet) for the next 5 days. Patient taking differently: Take 20-40 mg by mouth See admin instructions. Take 40mg  (2 tablets) in the morning and 20mg  (1 tablet) at bedtime. 09/17/18  Yes Rory Percy, DO  hydrALAZINE  (APRESOLINE) 50 MG tablet take 1 tablet (50mg ) by mouth three times a day 08/18/18  Yes Rumball, Bryson Ha, DO  latanoprost (XALATAN) 0.005 % ophthalmic solution Place 1 drop into both eyes at bedtime.   Yes [provider]  levothyroxine (SYNTHROID, LEVOTHROID) 75 MCG tablet Take 1 tablet (75 mcg total) by mouth daily. 08/18/18  Yes Rory Percy, DO  Multiple Vitamin (GNP ESSENTIAL ONE DAILY) TABS TAKE 1 TABLET BY MOUTH EVERY DAY Patient taking differently: Take 1 tablet by mouth daily.  06/01/18  Yes Mayo, Pete Pelt, MD  pantoprazole (PROTONIX) 20 MG tablet TAKE 1 TABLET BY MOUTH 2 TIMES DAILY Patient taking differently: Take 20 mg by mouth 2 (two) times daily.  09/10/18  Yes Tammi Klippel, Sherin, DO  polyethylene glycol powder (GLYCOLAX/MIRALAX) powder Take 17 g by mouth 2 (two) times daily. Hold for loose stools Patient taking differently: Take 17 g by mouth 2 (two) times daily as needed for mild constipation. Hold for loose stools 04/21/18  Yes Mayo, Pete Pelt, MD  pravastatin (PRAVACHOL) 40 MG tablet take 1 tablet (40 mg) by mouth daily at bedtime 08/18/18  Yes Rumball, Alison, DO  sodium bicarbonate 650 MG tablet Take 1 tablet (650 mg total) by mouth 2 (two) times daily. 07/16/13  Yes Marin Olp, MD  sodium zirconium cyclosilicate (LOKELMA) 5 g packet Take 5 g by mouth daily. 09/01/18  Yes Wilber Oliphant, MD  vitamin B-12 (CYANOCOBALAMIN) 100 MCG tablet TAKE 1 TABLET BY MOUTH EVERY DAY Patient taking differently: Take 100 mcg by mouth daily.  09/10/18  Yes Tammi Klippel, Sherin, DO  feeding supplement (BOOST / RESOURCE BREEZE) LIQD Take 1 Container by mouth 3 (three) times daily between meals. Patient not taking: Reported on 08/25/2018 08/29/17   Carlyle Dolly, MD  furosemide (LASIX) 40 MG tablet TAKE 1 TABLET BY MOUTH EVERY DAY Patient not taking: Reported on 09/06/2018 06/17/18   Mayo, Pete Pelt, MD  glucose blood (ONE TOUCH ULTRA TEST) test strip Check blood sugar three times daily 07/30/18    Rory Percy, DO  Lancets [facility] ULTRASOFT) lancets Check sugar up to 6 x daily 07/10/18   Rory Percy, DO  liraglutide (VICTOZA) 18 MG/3ML SOPN Inject 0.1 mLs (0.6 mg total) into the skin daily. 07/21/18 08/25/18  Rory Percy, DO  ONETOUCH DELICA LANCETS FINE MISC 1 each by Does not apply route every morning. 03/29/15   Olam Idler, MD    Current Facility-Administered Medications  Medication Dose Route Frequency Provider Last Rate Last Dose  . 0.45 % sodium chloride infusion   Intravenous Continuous Michael Boston, MD 75 mL/hr at 09/26/18 1558    . alum & mag hydroxide-simeth (MAALOX/MYLANTA) 200-200-20 MG/5ML suspension 30 mL  30 mL Oral Q6H PRN Michael Boston, MD      . bisacodyl (DULCOLAX) suppository  10 mg  10 mg Rectal Daily Michael Boston, MD   10 mg at 09/26/18 1006  . brimonidine (ALPHAGAN) 0.2 % ophthalmic solution 1 drop  1 drop Both Eyes Q12H Chambliss, Marshall L, MD   1 drop at 09/26/18 1005   And  . timolol (TIMOPTIC) 0.5 % ophthalmic solution 1 drop  1 drop Both Eyes Q12H Chambliss, Jeb Levering, MD   1 drop at 09/26/18 1005  . enoxaparin (LOVENOX) injection 30 mg  30 mg Subcutaneous Q24H Sela Hilding, MD   30 mg at 09/25/18 2306  . famotidine (PEPCID) IVPB 20 mg premix  20 mg Intravenous Q24H Orson Eva J, DO   Stopped at 09/26/18 1339  . guaiFENesin-dextromethorphan (ROBITUSSIN DM) 100-10 MG/5ML syrup 10 mL  10 mL Oral Q4H PRN Michael Boston, MD      . hydrocortisone (ANUSOL-HC) 2.5 % rectal cream 1 application  1 application Topical QID PRN Michael Boston, MD      . hydrocortisone cream 1 % 1 application  1 application Topical TID PRN Michael Boston, MD      . insulin aspart (novoLOG) injection 0-9 Units  0-9 Units Subcutaneous Q4H Sela Hilding, MD   1 Units at 09/25/18 2308  . latanoprost (XALATAN) 0.005 % ophthalmic solution 1 drop  1 drop Both Eyes QHS Wilber Oliphant, MD   1 drop at 09/25/18 2200  . levothyroxine (SYNTHROID, LEVOTHROID) injection 37.5 mcg   37.5 mcg Intravenous Daily Benay Pike, MD   37.5 mcg at 09/26/18 1005  . lip balm (CARMEX) ointment 1 application  1 application Topical BID Michael Boston, MD   1 application at 16/10/96 1400  . magic mouthwash  15 mL Oral QID PRN Michael Boston, MD      . menthol-cetylpyridinium (CEPACOL) lozenge 3 mg  1 lozenge Oral PRN Michael Boston, MD      . methocarbamol (ROBAXIN) 1,000 mg in dextrose 5 % 50 mL IVPB  1,000 mg Intravenous Q6H PRN Michael Boston, MD      . phenol (CHLORASEPTIC) mouth spray 1-2 spray  1-2 spray Mouth/Throat PRN Michael Boston, MD        Allergies as of 09/10/2018  . (No Known Allergies)    Family History  Problem Relation Age of Onset  . Cancer Father        prostate  . Heart disease Mother     Social History   Socioeconomic History  . Marital status: Divorced    Spouse name: Not on file  . Number of children: Not on file  . Years of education: Not on file  . Highest education level: Not on file  Occupational History  . Not on file  Social Needs  . Financial resource strain: Not on file  . Food insecurity:    Worry: Not on file    Inability: Not on file  . Transportation needs:    Medical: Not on file    Non-medical: Not on file  Tobacco Use  . Smoking status: Former Smoker    Types: Cigarettes    Last attempt to quit: 01/18/2011    Years since quitting: 7.6  . Smokeless tobacco: Never Used  . Tobacco comment: 08/27/2017 "didn't smoke alot; can't tell you how long I smoked"  Substance and Sexual Activity  . Alcohol use: No  . Drug use: No  . Sexual activity: Not on file  Lifestyle  . Physical activity:    Days per week: Not on file    Minutes per  session: Not on file  . Stress: Not on file  Relationships  . Social connections:    Talks on phone: Not on file    Gets together: Not on file    Attends religious service: Not on file    Active member of club or organization: Not on file    Attends meetings of clubs or organizations: Not on  file    Relationship status: Not on file  . Intimate partner violence:    Fear of current or ex partner: Not on file    Emotionally abused: Not on file    Physically abused: Not on file    Forced sexual activity: Not on file  Other Topics Concern  . Not on file  Social History Narrative  . Not on file    Review of Systems: Gen: Denies any fever, chills, sweats, anorexia, fatigue, weakness, malaise, weight loss, and sleep disorder HEENT: No visual complaints, No history of Retinopathy. Normal external appearance No Epistaxis or Sore throat. No sinusitis.   CV: Denies chest pain, angina, palpitations, syncope, orthopnea, PND, peripheral edema, and claudication. Resp: Denies dyspnea at rest, dyspnea with exercise, cough, sputum, wheezing, coughing up blood, and pleurisy. GI: Abdominal pain, normal distention NG tube GU : Denies urinary burning, blood in urine, urinary frequency, urinary hesitancy, nocturnal urination, and urinary incontinence.  No renal calculi. MS: Denies joint pain, limitation of movement, and swelling, stiffness, low back pain, extremity pain. Denies muscle weakness, cramps, atrophy.  No use of non steroidal antiinflammatory drugs. Derm: Denies rash, itching, dry skin, hives, moles, warts, or unhealing ulcers.  Psych: Denies depression, anxiety, memory loss, suicidal ideation, hallucinations, paranoia, and confusion. Heme: Denies bruising, bleeding, and enlarged lymph nodes. Neuro: No headache.  No diplopia. No dysarthria.  No dysphasia.  No history of CVA.  No Seizures. No paresthesias.  No weakness. Endocrine positive for DM.  Positive for thyroid disease.  No Adrenal disease.  Physical Exam: Vital signs in last 24 hours: Temp:  [97.7 F (36.5 C)-98.8 F (37.1 C)] 97.7 F (36.5 C) (09/28 1404) Pulse Rate:  [70-95] 70 (09/28 1404) Resp:  [14-19] 14 (09/28 1404) BP: (141-173)/(79-91) 141/79 (09/28 1404) SpO2:  [97 %-100 %] 100 % (09/28 1404) Last BM Date:  09/21/18 General: Ill-appearing thin gentleman NG tube in place Head:  Normocephalic and atraumatic. Eyes:  Sclera clear, no icterus.   Conjunctiva pink. Ears:  Normal auditory acuity. Nose:  No deformity, discharge,  or lesions. Mouth:  No deformity or lesions, dentition normal.  NG tube Neck:  Supple; no masses or thyromegaly. JVP not elevated Lungs:  Clear throughout to auscultation.   No wheezes, crackles, or rhonchi. No acute distress. Heart:  Regular rate and rhythm; no murmurs, clicks, rubs,  or gallops. Abdomen: Soft nontender mildly distended bowel sounds hypoactive Msk:  Symmetrical without gross deformities. Normal posture. Pulses:  No carotid, renal, femoral bruits. DP and PT symmetrical and equal Extremities:  Without clubbing or edema. Neurologic:  Alert and  oriented x4;  grossly normal neurologically. Skin:  Intact without significant lesions or rashes.   Intake/Output from previous day: 09/27 0701 - 09/28 0700 In: 1657.9 [I.V.:1537.9; NG/GT:120] Out: 400 [Emesis/NG output:400] Intake/Output this shift: Total I/O In: 1303.7 [I.V.:1253.7; IV Piggyback:50] Out: 100 [Urine:100]  Lab Results: Recent Labs    09/08/2018 1131 09/25/18 1010  WBC 10.6* 7.8  HGB 13.5 13.3  HCT 45.0 44.7  PLT 159 164   BMET Recent Labs    09/06/2018 1131 09/25/18 1010 09/26/18  0513  NA 139 141 139  K 5.0 5.3* 4.1  CL 99 100 101  CO2 26 30 25   GLUCOSE 215* 156* 120*  BUN 72* 83* 87*  CREATININE 2.25* 2.46* 2.36*  CALCIUM 8.7* 9.0 8.3*   LFT Recent Labs    09/16/2018 1131  PROT 7.0  ALBUMIN 3.7  AST 43*  ALT 22  ALKPHOS 62  BILITOT 0.9   PT/INR No results for input(s): LABPROT, INR in the last 72 hours. Hepatitis Panel No results for input(s): HEPBSAG, HCVAB, HEPAIGM, HEPBIGM in the last 72 hours.  Studies/Results: Dg Abd Portable 1v-small Bowel Obstruction Protocol-initial, 8 Hr Delay  Result Date: 09/26/2018 CLINICAL DATA:  SBO, 8 hour delay EXAM: PORTABLE ABDOMEN  - 1 VIEW COMPARISON:  09/26/2018 FINDINGS: Enteric tube overlies the right lower mediastinum, likely within the patient's hiatal hernia when correlating with CT. Multiple dilated loops of small bowel in the left mid abdomen. Contrast opacifies distal small bowel in the right lower quadrant, possibly extending to the cecum. Moderate stool in the right colon. Surgical clips in the upper abdomen. Degenerative changes of the lumbar spine. IMPRESSION: Multiple dilated loops of small bowel in the left mid abdomen, compatible with small bowel obstruction. Contrast opacifies distal small bowel in the right lower quadrant, possibly extending to the cecum. Electronically Signed   By: Julian Hy M.D.   On: 09/26/2018 02:50   Dg Abd Portable 1v-small Bowel Protocol-position Verification  Result Date: 09/21/2018 CLINICAL DATA:  Enteric tube placement, follow-up small-bowel obstruction EXAM: PORTABLE ABDOMEN - 1 VIEW COMPARISON:  09/01/2018 CT abdomen/pelvis FINDINGS: Enteric tube terminates in the right lower mediastinum. Diffuse prominent small bowel dilatation throughout the abdomen, not appreciably changed. Enteric contrast is noted within mid pelvic bowel loops. No evidence of pneumatosis or pneumoperitoneum. Surgical clips are noted in the upper abdomen. No radiopaque nephrolithiasis. IMPRESSION: 1. Enteric tube terminates in the right lower mediastinum, either within the lower thoracic esophagus or within the herniated portion of the stomach. 2. No appreciable change in diffuse prominent small bowel dilatation. Enteric contrast is noted within mid pelvic bowel loops, either within pelvic small bowel and/or distal colon (oral contrast was present within the distal colon and rectum on the CT study performed earlier today). Findings suggest stable partial distal small bowel obstruction. Electronically Signed   By: Ilona Sorrel M.D.   On: 09/11/2018 22:55    Assessment/Plan: 1. Chronic kidney disease stage IV.   Creatinine slightly elevated above baseline of 2-2.2 this may be secondary to some volume depletion due to small bowel obstruction and third spacing.  We did check a renal ultrasound although a CT scan of his abdomen did not reveal any hydronephrosis.  He is not taking any nephrotoxic medications there is no history of IV contrast use no ACE inhibitors or ARB's no nonsteroidal anti-inflammatory drugs.  Continue to avoid all of these products and medications. 2. Hypertension blood pressure appears to be well controlled at this present time. 3.  Anemia not an issue at this time 4.  Bones no vitamin D products will check calcium phosphorus 5.  Hypothyroidism on replacement.  LOS: [place] @TODAY @5 :17 PM

## 2018-09-26 NOTE — Evaluation (Signed)
Physical Therapy Evaluation Patient Details Name: Philip Matthews MRN: [id number] DOB: [birth year] Today's Date: 09/26/2018   History of Present Illness  84 year old male with known distal renal tubular acidosis and hyperkalemia, osteoarthritis, hypothyroidism, type 2 diabetes, congenital deafness who presents with small bowel obsturction. His daughter is his primary care giver.   Clinical Impression  Patient tolerateated eval well. He was limited by his NG tube as far as gait distance but he was able to walk. He used a walker. At baseline his duaghter reports he only used the walker when he was fatigued. He may benefit from home health PT for further strengthening depending on how he does when he gets moving more.     Follow Up Recommendations Home health PT    Equipment Recommendations  None recommended by PT    Recommendations for Other Services Rehab consult     Precautions / Restrictions Precautions Precautions: None Restrictions Weight Bearing Restrictions: No      Mobility  Bed Mobility Overal bed mobility: Modified Independent             General bed mobility comments: able to sit up at the edge using just the handrail.   Transfers Overall transfer level: Needs assistance Equipment used: Rolling walker (2 wheeled) Transfers: Sit to/from Stand Sit to Stand: Min guard         General transfer comment: min gaurd for initial balance when standing   Ambulation/Gait Ambulation/Gait assistance: Min guard Gait Distance (Feet): 20 Feet Assistive device: Rolling walker (2 wheeled) Gait Pattern/deviations: Step-through pattern   Gait velocity interpretation: <1.31 ft/sec, indicative of household ambulator General Gait Details: Patient estimated about 3' he had the NG tube so he just walked back and fourth several times. He had no complaints   Stairs            Wheelchair Mobility    Modified Rankin (Stroke Patients Only)       Balance Overall  balance assessment: Needs assistance Sitting-balance support: No upper extremity supported Sitting balance-Leahy Scale: Good     Standing balance support: Bilateral upper extremity supported Standing balance-Leahy Scale: Poor                               Pertinent Vitals/Pain Pain Assessment: Faces Pain Score: 3  Faces Pain Scale: Hurts a little bit Pain Location: right abdomen reprots minor soreness  Pain Descriptors / Indicators: Aching Pain Intervention(s): Limited activity within patient's tolerance;Monitored during session;Repositioned    Home Living Family/patient expects to be discharged to:: Private residence Living Arrangements: Children Available Help at Discharge: Family;Available 24 hours/day Type of Home: House Home Access: Stairs to enter Entrance Stairs-Rails: Can reach both Entrance Stairs-Number of Steps: 3 Home Layout: One level        Prior Function Level of Independence: Independent with assistive device(s)         Comments: walked with and without a walker. Only walked limited distances.      Hand Dominance   Dominant Hand: Right    Extremity/Trunk Assessment   Upper Extremity Assessment Upper Extremity Assessment: Overall WFL for tasks assessed    Lower Extremity Assessment Lower Extremity Assessment: Overall WFL for tasks assessed    Cervical / Trunk Assessment Cervical / Trunk Assessment: Normal  Communication   Communication: Interpreter utilized;Deaf  Cognition Arousal/Alertness: Awake/alert Behavior During Therapy: WFL for tasks assessed/performed Overall Cognitive Status: Within Functional Limits for tasks assessed  General Comments General comments (skin integrity, edema, etc.): NG tube in place     Exercises Total Joint Exercises Ankle Circles/Pumps: 10 reps Gluteal Sets: 10 reps Long Arc Quad: 10 reps Other Exercises Other Exercises: sitting march  x10  Other Exercises: seated hip abduction x10   Assessment/Plan    PT Assessment Patient needs continued PT services  PT Problem List Decreased strength;Decreased activity tolerance       PT Treatment Interventions DME instruction;Gait training;Stair training;Functional mobility training;Therapeutic activities;Therapeutic exercise;Patient/family education    PT Goals (Current goals can be found in the Care Plan section)  Acute Rehab PT Goals Patient Stated Goal: to get stronger  PT Goal Formulation: With patient Time For Goal Achievement: 10/03/18 Potential to Achieve Goals: Good    Frequency Min 3X/week   Barriers to discharge        Co-evaluation               AM-PAC PT "6 Clicks" Daily Activity  Outcome Measure Difficulty turning over in bed (including adjusting bedclothes, sheets and blankets)?: A Little Difficulty moving from lying on back to sitting on the side of the bed? : A Little Difficulty sitting down on and standing up from a chair with arms (e.g., wheelchair, bedside commode, etc,.)?: A Little Help needed moving to and from a bed to chair (including a wheelchair)?: A Little Help needed walking in hospital room?: A Little Help needed climbing 3-5 steps with a railing? : Total 6 Click Score: 16    End of Session Equipment Utilized During Treatment: Gait belt Activity Tolerance: Patient tolerated treatment well Patient left: in chair;with call bell/phone within reach;with family/visitor present Nurse Communication: Mobility status PT Visit Diagnosis: Unsteadiness on feet (R26.81);Other abnormalities of gait and mobility (R26.89);Muscle weakness (generalized) (M62.81)    Time: 8315-1761 PT Time Calculation (min) (ACUTE ONLY): 21 min   Charges:   PT Evaluation $PT Eval Moderate Complexity: 1 Mod            Carney Living PT DPT  09/26/2018, 11:52 AM

## 2018-09-27 ENCOUNTER — Inpatient Hospital Stay (HOSPITAL_COMMUNITY): Payer: Medicare Other

## 2018-09-27 DIAGNOSIS — N184 Chronic kidney disease, stage 4 (severe): Secondary | ICD-10-CM

## 2018-09-27 LAB — GLUCOSE, CAPILLARY
GLUCOSE-CAPILLARY: 102 mg/dL — AB (ref 70–99)
GLUCOSE-CAPILLARY: 110 mg/dL — AB (ref 70–99)
GLUCOSE-CAPILLARY: 94 mg/dL (ref 70–99)
Glucose-Capillary: 95 mg/dL (ref 70–99)
Glucose-Capillary: 99 mg/dL (ref 70–99)
Glucose-Capillary: 100 mg/dL — ABNORMAL HIGH (ref 70–99)

## 2018-09-27 LAB — CBC
HCT: 42.2 % (ref 39.0–52.0)
Hemoglobin: 12.7 g/dL — ABNORMAL LOW (ref 13.0–17.0)
MCH: 27.1 pg (ref 26.0–34.0)
MCHC: 30.1 g/dL (ref 30.0–36.0)
MCV: 90 fL (ref 78.0–100.0)
Platelets: 128 K/uL — ABNORMAL LOW (ref 150–400)
RBC: 4.69 MIL/uL (ref 4.22–5.81)
RDW: 16.5 % — ABNORMAL HIGH (ref 11.5–15.5)
WBC: 6.2 K/uL (ref 4.0–10.5)

## 2018-09-27 LAB — BASIC METABOLIC PANEL
ANION GAP: 14 (ref 5–15)
BUN: 92 mg/dL — AB (ref 8–23)
CALCIUM: 8.2 mg/dL — AB (ref 8.9–10.3)
CO2: 21 mmol/L — ABNORMAL LOW (ref 22–32)
Chloride: 102 mmol/L (ref 98–111)
Creatinine, Ser: 2.27 mg/dL — ABNORMAL HIGH (ref 0.61–1.24)
GFR calc Af Amer: 28 mL/min — ABNORMAL LOW (ref >60)
GFR calc non Af Amer: 24 mL/min — ABNORMAL LOW (ref >60)
GLUCOSE: 106 mg/dL — AB (ref 70–99)
Potassium: 4.2 mmol/L (ref 3.5–5.1)
Sodium: 137 mmol/L (ref 135–145)

## 2018-09-27 MED ORDER — MAGIC MOUTHWASH
15.0000 mL | Freq: Four times a day (QID) | ORAL | Status: AC
  Administered 2018-09-27 – 2018-09-29 (×9): 15 mL via ORAL
  Filled 2018-09-27 (×9): qty 15

## 2018-09-27 NOTE — Progress Notes (Signed)
Philip Matthews [id number] 1928-09-24  CARE TEAM:  PCP: Rory Percy, DO  Outpatient Care Team: Patient Care Team: Rory Percy, DO as PCP - General (Family Medicine) Kennith Center, RD as Dietitian (Family Medicine)  Inpatient Treatment Team: Treatment Team: Attending Provider: Lind Covert, MD; Consulting Physician: Edison Pace, Md, MD; Technician: Gasper Sells, [place]; Registered Nurse: Dorian Pod, RN; Technician: Raenette Rover, NT; Respiratory Therapist: Romeo Apple, RRT; Registered Nurse: Charisse March, RN; Consulting Physician: Edrick Oh, MD   Problem List:   Principal Problem:   Small bowel obstruction [facility]) Active Problems:   Bilateral deafness   Chronic kidney disease (CKD), stage IV (severe) ([place])   Type II diabetes mellitus, controlled but with peripheral neuropathy   Anemia in chronic kidney disease   Generalized abdominal pain      * No surgery found [facility] Stay = 3 days  Assessment  SBO peristent  Plan:  -cont SBO protocol with distention & equivocal Xrays.  Given persistent distention and obstipation, I am skeptical that he will resolve nonoperatively.  Patient would like to hold off on surgery.  Does not open up by tomorrow, need to reconsider need for surgical intervention.  Hold off for today.  -Check x-rays.  -magic mouthwash QID for NGT scheduled  -VTE prophylaxis- SCDs, etc -mobilize as tolerated to help recovery  20 minutes spent in review, evaluation, examination, counseling, and coordination of care.  More than 50% of that time was spent in counseling to patient with interpreter at bedside  09/27/2018    Subjective: (Chief complaint)  Feels about the same. C/o sore throat Some soreness in left abdomen but milder. Deaf interpreter in room w me   Objective:  Vital signs:  Vitals:   09/26/18 0432 09/26/18 1404 09/26/18 2045 09/27/18 0403  BP: (!) 149/91 (!) 141/79 (!) 143/85 (!) 141/79    Pulse: 80 70 79 73  Resp: _0 Temp: 98.4 F (36.9 C) 97.7 F (36.5 C) 98.5 F (36.9 C) 98 F (36.7 C)  TempSrc: Oral Oral Oral Oral  SpO2: 100% 100% 97% 97%    Last BM Date: 09/21/18  Intake/Output   Yesterday:  09/28 0701 - 09/29 0700 In: 1303.7 [I.V.:1253.7; IV Piggyback:50] Out: 156 [Urine:625] This shift:  No intake/output data recorded.  Bowel function:  Flatus: No  BM:  No  Drain: Bilious NGT   Physical Exam:  General: Pt awake/alert/oriented x4 in no acute distress Eyes: PERRL, normal EOM.  Sclera clear.  No icterus Neuro: CN II-XII intact w/o focal sensory/motor deficits. Lymph: No head/neck/groin lymphadenopathy Psych:  No delerium/psychosis/paranoia HENT: Normocephalic, Mucus membranes moist.  No thrush Neck: Supple, No tracheal deviation Chest:  No chest wall pain w good excursion CV:  Pulses intact.  Regular rhythm MS: Normal AROM mjr joints.  No obvious deformity  Abdomen: Soft. Doughty w SB loops felt.   Moderately distended.  Tenderness at Left side of abdomen mild.  Unchanged.  No guarding..  No evidence of peritonitis.  No incarcerated hernias.  Ext:  No deformity.  No mjr edema.  No cyanosis Skin: No petechiae / purpura  Results:   Labs: Results for orders placed or performed during the hospital encounter of 09/11/2018 (from the past 48 hour(s))  Basic metabolic panel     Status: Abnormal   Collection Time: 09/25/18 10:10 AM  Result Value Ref Range   Sodium 141 135 - 145 mmol/L   Potassium 5.3 (  H) 3.5 - 5.1 mmol/L   Chloride 100 98 - 111 mmol/L   CO2 30 22 - 32 mmol/L   Glucose, Bld 156 (H) 70 - 99 mg/dL   BUN 83 (H) 8 - 23 mg/dL   Creatinine, Ser 2.46 (H) 0.61 - 1.24 mg/dL   Calcium 9.0 8.9 - 10.3 mg/dL   GFR calc non Af Amer 22 (L) >60 mL/min   GFR calc Af Amer 26 (L) >60 mL/min    Comment: (NOTE) The eGFR has been calculated using the CKD EPI equation. This calculation has not been validated in all clinical  situations. eGFR's persistently <60 mL/min signify possible Chronic Kidney Disease.    Anion gap 11 5 - 15    Comment: Performed at [facility] [address] [place], [place] [address]  CBC     Status: Abnormal   Collection Time: 09/25/18 10:10 AM  Result Value Ref Range   WBC 7.8 4.0 - 10.5 K/uL   RBC 4.93 4.22 - 5.81 MIL/uL   Hemoglobin 13.3 13.0 - 17.0 g/dL   HCT 44.7 39.0 - 52.0 %   MCV 90.7 78.0 - 100.0 fL   MCH 27.0 26.0 - 34.0 pg   MCHC 29.8 (L) 30.0 - 36.0 g/dL   RDW 17.3 (H) 11.5 - 15.5 %   Platelets 164 150 - 400 K/uL    Comment: Performed at [facility] [address] [place], [place] [address]  Hemoglobin A1c     Status: Abnormal   Collection Time: 09/25/18 10:10 AM  Result Value Ref Range   Hgb A1c MFr Bld 6.8 (H) 4.8 - 5.6 %    Comment: (NOTE) Pre diabetes:          5.7%-6.4% Diabetes:              >6.4% Glycemic control for   <7.0% adults with diabetes    Mean Plasma Glucose 148.46 mg/dL    Comment: Performed at [facility] [address], [place], [place] [address]  Urinalysis, Routine w reflex microscopic     Status: Abnormal   Collection Time: 09/25/18 10:42 AM  Result Value Ref Range   Color, Urine YELLOW YELLOW   APPearance CLOUDY (A) CLEAR   Specific Gravity, Urine 1.014 1.005 - 1.030   pH 5.0 5.0 - 8.0   Glucose, UA NEGATIVE NEGATIVE mg/dL   Hgb urine dipstick SMALL (A) NEGATIVE   Bilirubin Urine NEGATIVE NEGATIVE   Ketones, ur NEGATIVE NEGATIVE mg/dL   Protein, ur 30 (A) NEGATIVE mg/dL   Nitrite NEGATIVE NEGATIVE   Leukocytes, UA LARGE (A) NEGATIVE   RBC / HPF 6-10 0 - 5 RBC/hpf   WBC, UA >50 (H) 0 - 5 WBC/hpf   Bacteria, UA MANY (A) NONE SEEN   Squamous Epithelial / LPF 0-5 0 - 5   Mucus PRESENT     Comment: Performed at [facility], [place] [address] [place], [place] [address]  Glucose, capillary     Status: Abnormal   Collection Time: 09/25/18 12:31 PM  Result Value Ref Range   Glucose-Capillary 113 (H) 70  - 99 mg/dL   Comment 1 Notify RN   Glucose, capillary     Status: Abnormal   Collection Time: 09/25/18  4:02 PM  Result Value Ref Range   Glucose-Capillary 112 (H) 70 - 99 mg/dL   Comment 1 Notify RN   Glucose, capillary     Status: Abnormal   Collection Time: 09/25/18  8:55 PM  Result Value Ref Range   Glucose-Capillary 136 (H) 70 - 99 mg/dL  Glucose, capillary     Status: Abnormal   Collection Time: 09/25/18 11:59 PM  Result Value Ref Range   Glucose-Capillary 118 (H) 70 - 99 mg/dL   Comment 1 Notify RN    Comment 2 Document in Chart   Glucose, capillary     Status: Abnormal   Collection Time: 09/26/18  4:27 AM  Result Value Ref Range   Glucose-Capillary 119 (H) 70 - 99 mg/dL   Comment 1 Notify RN    Comment 2 Document in Chart   Basic metabolic panel     Status: Abnormal   Collection Time: 09/26/18  5:13 AM  Result Value Ref Range   Sodium 139 135 - 145 mmol/L   Potassium 4.1 3.5 - 5.1 mmol/L   Chloride 101 98 - 111 mmol/L   CO2 25 22 - 32 mmol/L   Glucose, Bld 120 (H) 70 - 99 mg/dL   BUN 87 (H) 8 - 23 mg/dL   Creatinine, Ser 2.36 (H) 0.61 - 1.24 mg/dL   Calcium 8.3 (L) 8.9 - 10.3 mg/dL   GFR calc non Af Amer 23 (L) >60 mL/min   GFR calc Af Amer 27 (L) >60 mL/min    Comment: (NOTE) The eGFR has been calculated using the CKD EPI equation. This calculation has not been validated in all clinical situations. eGFR's persistently <60 mL/min signify possible Chronic Kidney Disease.    Anion gap 13 5 - 15    Comment: Performed at [facility] [address], [place], [place] [address]  Glucose, capillary     Status: Abnormal   Collection Time: 09/26/18  7:57 AM  Result Value Ref Range   Glucose-Capillary 117 (H) 70 - 99 mg/dL  Glucose, capillary     Status: Abnormal   Collection Time: 09/26/18 12:01 PM  Result Value Ref Range   Glucose-Capillary 106 (H) 70 - 99 mg/dL  Glucose, capillary     Status: Abnormal   Collection Time: 09/26/18  3:53 PM  Result Value Ref  Range   Glucose-Capillary 110 (H) 70 - 99 mg/dL  Glucose, capillary     Status: Abnormal   Collection Time: 09/26/18  8:43 PM  Result Value Ref Range   Glucose-Capillary 101 (H) 70 - 99 mg/dL  Glucose, capillary     Status: Abnormal   Collection Time: 09/27/18 12:36 AM  Result Value Ref Range   Glucose-Capillary 110 (H) 70 - 99 mg/dL  Glucose, capillary     Status: Abnormal   Collection Time: 09/27/18  4:04 AM  Result Value Ref Range   Glucose-Capillary 102 (H) 70 - 99 mg/dL    Imaging / Studies: Dg Abd Portable 1v-small Bowel Obstruction Protocol-initial, 8 Hr Delay  Result Date: 09/26/2018 CLINICAL DATA:  SBO, 8 hour delay EXAM: PORTABLE ABDOMEN - 1 VIEW COMPARISON:  09/04/2018 FINDINGS: Enteric tube overlies the right lower mediastinum, likely within the patient's hiatal hernia when correlating with CT. Multiple dilated loops of small bowel in the left mid abdomen. Contrast opacifies distal small bowel in the right lower quadrant, possibly extending to the cecum. Moderate stool in the right colon. Surgical clips in the upper abdomen. Degenerative changes of the lumbar spine. IMPRESSION: Multiple dilated loops of small bowel in the left mid abdomen, compatible with small bowel obstruction. Contrast opacifies distal small bowel in the right lower quadrant, possibly extending to the cecum. Electronically Signed  By: Julian Hy M.D.   On: 09/26/2018 02:50    Medications / Allergies: per chart  Antibiotics: Anti-infectives (From admission, onward)   None        Note: Portions of this report may have been transcribed using voice recognition software. Every effort was made to ensure accuracy; however, inadvertent computerized transcription errors may be present.   Any transcriptional errors that result from this process are unintentional.     Adin Hector, MD, FACS, MASCRS Gastrointestinal and Minimally Invasive Surgery    [address], [place],  [place] [address] [phone] Main / Paging [phone] Fax

## 2018-09-27 NOTE — Progress Notes (Signed)
Family Medicine Teaching Service Daily Progress Note Intern Pager: [phone]  Patient name: Philip Matthews Medical record number: [id number] Date of birth: 1928-09-18 Age: 84 y.o. Gender: male  Primary Care Provider: Rory Percy, DO Consultants: surgery,  Code Status: Full code  Pt Overview and Major Events to Date:  Hospital Day 3 Admitted: 09/23/2018  Assessment and Plan: Tahsin Benyo Allenis a 84 y.o.malepresenting with a bowel obstruction. PMH is significant forT2DM, DVT, HTN,peripheral neuropathy, hyperlipidemia, gout, anemia, CKD, and bilateral deafness.  #SBO:   Patient reporting that abdomen feels better.  Patient reports that he had episode of flatus this morning after suppository and walking down hallway.  Previous history of hernia repair and colon surgery.  Patient and family hopeful they do not have to have surgery. -Surgery following, appreciate recommendations -Continue SBO protocol (NGT, gastrograffin, serial xray).  24-hour x-ray obtained earlier, read pending -NPO - IV zofran PRN, tylenol PRN pain  #Asymptomatic bacteruria - large LA, > 50 WBC, many bacteria.  0-5 squams.   - do not treat unless patient has symptoms of UTI  #deafness - needs ASL interpreter  #DM-II - takes victoza 0.6mg  qd at home. Glucose lebels 100-140 so far.  - f/u A1c - hold victoza  #CKD-IV - 2/2 distal RTA and T2DM. Home lasix 40mg /20mg .  Calcitriol 0.59mcg MWF, sodium Bicarb 650 qd. Creatinine improving and K wnl. BUN increasing to 80  - avoid nephrotoxic medications - nephro consulted -monitor BMPs - holding medications while NPO  #HTN - takes hydralazine 50mg  TID, lasix. Pharmacy states we could do PRN hydralazine q4 for severely elevated pressures. If we wanted to do lasix IV, use half dose.  - monitor vitals.  - hold meds while NPO  #Hx of hyperkalemia - distal RTA.  Takes lokelma 5mg  daily.  Currently 4.1 this AM.  - BMPs - holding while npo  #anemia of chronic  disease - Hgb increased over baseline on this admission. Using procrit 10k unites q2w and ferosul 325mg  daily - CBC daily  #GERD - patient feeling a burning in his throat today.  Was on protonix 20mg  BID at home - starting IV pepcid.   # gout - takes allopurinol 100mg  qd - hold while npo  #hypothyroidism - takes 31mcg daily.  Last TSH in august.  Pharmacy recommends doing 1/2 dose IV while NPO.  - restart at 1/2 dose IV  #hyperlipidemia - pravastatin 40mg  at home - hold while NPO  #primary open angle glaucoma - left severe/right mild.  Taking brimonidine-timolol 1 drop BID, latanoprost 1 drop at bedtime.   - continue home meds  #leukocytosis - wbc count minimally elevated to 10.6. Temp 96.8 on admission - monitor cbc  #hx of portal vein thrombosis and DVT - was taking coumadin until march 2019. No signs of thrombosis on physical exam this admission.  - monitor for DVT  Fluids:  . sodium chloride 75 mL/hr at 09/27/18 0916  . famotidine (PEPCID) IV Stopped (09/26/18 1339)  . methocarbamol (ROBAXIN) IV     Electrolytes: replete PRN  Nutrition: NPO GI ppx: IV pepcid DVT px: lovenox 30mg   Disposition: Home pending clinical improvement  Subjective:  Patient reports that he had flatus this morning.  States he is feeling better.  Family in room and reports that they want to avoid surgery if at all possible.  Answered many questions for them today.  Seen with ALS [employer] video interpreter  Objective: Temp:  [97.7 F (36.5 C)-98.5 F (36.9 C)] 98 F (36.7 C) (  09/29 0403) Pulse Rate:  [70-79] 73 (09/29 0403) Resp:  [14-16] 16 (09/29 0403) BP: (141-143)/(79-85) 141/79 (09/29 0403) SpO2:  [97 %-100 %] 97 % (09/29 0403) Intake/Output 09/28 0701 - 09/29 0700 In: 1303.7 [I.V.:1253.7; IV Piggyback:50] Out: 625 [Urine:625] Physical Exam:  General: NAD, pleasant, NG in place, not on suction Cardiovascular: RRR, no m/r/g, no LE edema Respiratory: CTA BL, normal work of  breathing Gastrointestinal: soft, nontender, mildly distended. Tender in LLQ. No guarding. MSK: moves 4 extremities equally Derm: no rashes appreciated Neuro: CN II-XII grossly intact Psych: AOx3  Laboratory: Recent Labs  Lab 09/25/2018 1131 09/25/18 1010 09/27/18 0810  WBC 10.6* 7.8 6.2  HGB 13.5 13.3 12.7*  HCT 45.0 44.7 42.2  PLT 159 164 128*   Recent Labs  Lab 09/20/2018 1131 09/25/18 1010 09/26/18 0513 09/27/18 0810  NA 139 141 139 137  K 5.0 5.3* 4.1 4.2  CL 99 100 101 102  CO2 26 30 25  21*  BUN 72* 83* 87* 92*  CREATININE 2.25* 2.46* 2.36* 2.27*  CALCIUM 8.7* 9.0 8.3* 8.2*  PROT 7.0  --   --   --   BILITOT 0.9  --   --   --   ALKPHOS 62  --   --   --   ALT 22  --   --   --   AST 43*  --   --   --   GLUCOSE 215* 156* 120* 106*    Imaging/Diagnostic Tests: Dg Abd Portable 1v-small Bowel Obstruction Protocol-initial, 8 Hr Delay  Result Date: 09/26/2018 CLINICAL DATA:  SBO, 8 hour delay EXAM: PORTABLE ABDOMEN - 1 VIEW COMPARISON:  08/31/2018 FINDINGS: Enteric tube overlies the right lower mediastinum, likely within the patient's hiatal hernia when correlating with CT. Multiple dilated loops of small bowel in the left mid abdomen. Contrast opacifies distal small bowel in the right lower quadrant, possibly extending to the cecum. Moderate stool in the right colon. Surgical clips in the upper abdomen. Degenerative changes of the lumbar spine. IMPRESSION: Multiple dilated loops of small bowel in the left mid abdomen, compatible with small bowel obstruction. Contrast opacifies distal small bowel in the right lower quadrant, possibly extending to the cecum. Electronically Signed   By: Julian Hy M.D.   On: 09/26/2018 02:50    [place], [place], DO 09/27/2018, 12:32 PM PGY-2, [place] Intern pager: [phone], text pages welcome

## 2018-09-27 NOTE — Progress Notes (Signed)
[facility] ROUNDING NOTE   Subjective:   Patient appears to be comfortable today without any complaints.  He still has an NG tube placed his abdomen does not hurt.  Sign language was performed by an interpreter.  Appreciate the assistance from Dr. Johney Maine.  Contemplating surgical intervention in a.m.  Blood pressure 141/79 temperature 98 O2 sats 97% room air  Sodium 137 potassium 4.2 chloride 102 CO2 21 BUN 92 creatinine 2.2 glucose 106 WBC 6.2 hemoglobin 12.7 platelets 128  Abdominal x-rays pending  Objective:  Vital signs in last 24 hours:  Temp:  [97.7 F (36.5 C)-98.5 F (36.9 C)] 98 F (36.7 C) (09/29 0403) Pulse Rate:  [70-79] 73 (09/29 0403) Resp:  [14-16] 16 (09/29 0403) BP: (141-143)/(79-85) 141/79 (09/29 0403) SpO2:  [97 %-100 %] 97 % (09/29 0403)  Weight change:  There were no vitals filed for this visit.  Intake/Output: I/O last 3 completed shifts: In: 2355.2 [I.V.:2305.2; IV Piggyback:50] Out: 625 [Urine:625]   Intake/Output this shift:  Total I/O In: 0  Out: 100 [Urine:100]  CVS- RRR RS- CTA ABD-distended soft nontender EXT- no edema   Basic Metabolic Panel: Recent Labs  Lab 09/14/2018 1131 09/25/18 1010 09/26/18 0513 09/27/18 0810  NA 139 141 139 137  K 5.0 5.3* 4.1 4.2  CL 99 100 101 102  CO2 26 30 25  21*  GLUCOSE 215* 156* 120* 106*  BUN 72* 83* 87* 92*  CREATININE 2.25* 2.46* 2.36* 2.27*  CALCIUM 8.7* 9.0 8.3* 8.2*    Liver Function Tests: Recent Labs  Lab 08/31/2018 1131  AST 43*  ALT 22  ALKPHOS 62  BILITOT 0.9  PROT 7.0  ALBUMIN 3.7   Recent Labs  Lab 09/11/2018 1131  LIPASE 35   No results for input(s): AMMONIA in the last 168 hours.  CBC: Recent Labs  Lab 09/02/2018 1131 09/25/18 1010 09/27/18 0810  WBC 10.6* 7.8 6.2  HGB 13.5 13.3 12.7*  HCT 45.0 44.7 42.2  MCV 90.7 90.7 90.0  PLT 159 164 128*    Cardiac Enzymes: No results for input(s): CKTOTAL, CKMB, CKMBINDEX, TROPONINI in the last 168  hours.  BNP: Invalid input(s): POCBNP  CBG: Recent Labs  Lab 09/26/18 1553 09/26/18 2043 09/27/18 0036 09/27/18 0404 09/27/18 0828  GLUCAP 110* 101* 110* 102* 94    Microbiology: Results for orders placed or performed during the hospital encounter of 08/25/18  MRSA PCR Screening     Status: None   Collection Time: 08/25/18  2:05 PM  Result Value Ref Range Status   MRSA by PCR NEGATIVE NEGATIVE Final    Comment:        The GeneXpert MRSA Assay (FDA approved for NASAL specimens only), is one component of a comprehensive MRSA colonization surveillance program. It is not intended to diagnose MRSA infection nor to guide or monitor treatment for MRSA infections. Performed at [facility], [place] [address], [place],  [address]     Coagulation Studies: No results for input(s): LABPROT, INR in the last 72 hours.  Urinalysis: Recent Labs    09/25/18 1042  COLORURINE YELLOW  LABSPEC 1.014  PHURINE 5.0  GLUCOSEU NEGATIVE  HGBUR SMALL*  BILIRUBINUR NEGATIVE  KETONESUR NEGATIVE  PROTEINUR 30*  NITRITE NEGATIVE  LEUKOCYTESUR LARGE*      Imaging: Dg Abd Portable 1v-small Bowel Obstruction Protocol-initial, 8 Hr Delay  Result Date: 09/26/2018 CLINICAL DATA:  SBO, 8 hour delay EXAM: PORTABLE ABDOMEN - 1 VIEW COMPARISON:  09/07/2018 FINDINGS: Enteric tube overlies  the right lower mediastinum, likely within the patient's hiatal hernia when correlating with CT. Multiple dilated loops of small bowel in the left mid abdomen. Contrast opacifies distal small bowel in the right lower quadrant, possibly extending to the cecum. Moderate stool in the right colon. Surgical clips in the upper abdomen. Degenerative changes of the lumbar spine. IMPRESSION: Multiple dilated loops of small bowel in the left mid abdomen, compatible with small bowel obstruction. Contrast opacifies distal small bowel in the right lower quadrant, possibly extending to the cecum. Electronically Signed    By: Julian Hy M.D.   On: 09/26/2018 02:50     Medications:   . sodium chloride 75 mL/hr at 09/27/18 0916  . famotidine (PEPCID) IV Stopped (09/26/18 1339)  . methocarbamol (ROBAXIN) IV     . bisacodyl  10 mg Rectal Daily  . brimonidine  1 drop Both Eyes Q12H   And  . timolol  1 drop Both Eyes Q12H  . enoxaparin (LOVENOX) injection  30 mg Subcutaneous Q24H  . insulin aspart  0-9 Units Subcutaneous Q4H  . latanoprost  1 drop Both Eyes QHS  . levothyroxine  37.5 mcg Intravenous Daily  . lip balm  1 application Topical BID  . magic mouthwash  15 mL Oral QID   alum & mag hydroxide-simeth, guaiFENesin-dextromethorphan, hydrocortisone, hydrocortisone cream, menthol-cetylpyridinium, methocarbamol (ROBAXIN) IV, phenol  Assessment/ Plan:   Chronic renal disease stage IV baseline 2-2.2.  CT abdomen do not reveal any hydronephrosis not taking any nephrotoxic medications no ACE inhibitors no ARB's  Hypertension blood pressure appears to be adequately controlled  Anemia not an issue at this time  Bone stable continue to follow calcium phosphorus  Hypothyroidism on replacement  Small bowel obstruction contemplating surgery  Deafness will need sign language interpreter   LOS: 3 Sherril Croon @TODAY @12 :11 PM

## 2018-09-28 ENCOUNTER — Inpatient Hospital Stay (HOSPITAL_COMMUNITY): Payer: Medicare Other

## 2018-09-28 ENCOUNTER — Inpatient Hospital Stay: Payer: Self-pay

## 2018-09-28 ENCOUNTER — Ambulatory Visit: Payer: Medicare Other | Admitting: Podiatry

## 2018-09-28 DIAGNOSIS — E1143 Type 2 diabetes mellitus with diabetic autonomic (poly)neuropathy: Secondary | ICD-10-CM

## 2018-09-28 LAB — BASIC METABOLIC PANEL
Anion gap: 14 (ref 5–15)
BUN: 86 mg/dL — AB (ref 8–23)
CALCIUM: 8.6 mg/dL — AB (ref 8.9–10.3)
CO2: 20 mmol/L — ABNORMAL LOW (ref 22–32)
Chloride: 103 mmol/L (ref 98–111)
Creatinine, Ser: 2.06 mg/dL — ABNORMAL HIGH (ref 0.61–1.24)
GFR calc Af Amer: 32 mL/min — ABNORMAL LOW (ref >60)
GFR, EST NON AFRICAN AMERICAN: 27 mL/min — AB (ref >60)
GLUCOSE: 103 mg/dL — AB (ref 70–99)
Potassium: 4.6 mmol/L (ref 3.5–5.1)
SODIUM: 137 mmol/L (ref 135–145)

## 2018-09-28 LAB — GLUCOSE, CAPILLARY
GLUCOSE-CAPILLARY: 94 mg/dL (ref 70–99)
GLUCOSE-CAPILLARY: 92 mg/dL (ref 70–99)
GLUCOSE-CAPILLARY: 114 mg/dL — AB (ref 70–99)
GLUCOSE-CAPILLARY: 119 mg/dL — AB (ref 70–99)
Glucose-Capillary: 105 mg/dL — ABNORMAL HIGH (ref 70–99)
Glucose-Capillary: 114 mg/dL — ABNORMAL HIGH (ref 70–99)
Glucose-Capillary: 127 mg/dL — ABNORMAL HIGH (ref 70–99)

## 2018-09-28 LAB — PHOSPHORUS: Phosphorus: 4.2 mg/dL (ref 2.5–4.6)

## 2018-09-28 NOTE — Care Management Note (Addendum)
Case Management Note  Patient Details  Name: Philip Matthews MRN: [id number] Date of Birth: [birth year]  Subjective/Objective:                    Action/Plan:  Spoke to patient and daughter at bedside with interpreter. Confirmed patient from home with family and [place]. They want to continue with [facility] at discharge for [place] and [place]. Patient has rotator at home, no other DME needed per patient ,PT, daughter.  Text paged family medicine for orders and face to face.  Dan with [facility] aware.  Expected Discharge Date:                  Expected Discharge Plan:  [place]  In-House Referral:     Discharge planning Services  CM Consult  Post Acute Care Choice:  Home Health Choice offered to:  Patient, Adult Children  DME Arranged:  N/A DME Agency:  NA  HH Arranged:   PT,RN [place]:  [place]  Status of Service:  In process, will continue to follow  If discussed at Long Length of Stay Meetings, dates discussed:    Additional Comments:  Marilu Favre, RN 09/28/2018, 9:43 AM

## 2018-09-28 NOTE — Progress Notes (Signed)
[facility] ROUNDING NOTE   Subjective:   No BM yet, but passing gas.  NGT to suction still.  Surgery following - plan TBD based on progression of course.  MIVF continue.  I/Os + 300 yesterday, UOP 1.1L.  Spoke with him via ASL interpreter today.  Objective:  Vital signs in last 24 hours:  Temp:  [97.5 F (36.4 C)-97.9 F (36.6 C)] 97.8 F (36.6 C) (09/30 0418) Pulse Rate:  [68-70] 68 (09/30 0418) Resp:  [16-18] 18 (09/30 0418) BP: (134-150)/(73-74) 134/73 (09/30 0418) SpO2:  [97 %-99 %] 99 % (09/30 0418)  Weight change:  There were no vitals filed for this visit.  Intake/Output: I/O last 3 completed shifts: In: 2011.9 [I.V.:1911.9; NG/GT:100] Out: 2125 [Urine:1525; Emesis/NG output:600]   Intake/Output this shift:  No intake/output data recorded.  CVS- RRR RS- CTA ABD-distended soft nontender EXT- no edema   Basic Metabolic Panel: Recent Labs  Lab 09/23/2018 1131 09/25/18 1010 09/26/18 0513 09/27/18 0810 09/28/18 0811  NA 139 141 139 137 137  K 5.0 5.3* 4.1 4.2 4.6  CL 99 100 101 102 103  CO2 26 30 25  21* 20*  GLUCOSE 215* 156* 120* 106* 103*  BUN 72* 83* 87* 92* 86*  CREATININE 2.25* 2.46* 2.36* 2.27* 2.06*  CALCIUM 8.7* 9.0 8.3* 8.2* 8.6*    Liver Function Tests: Recent Labs  Lab 09/09/2018 1131  AST 43*  ALT 22  ALKPHOS 62  BILITOT 0.9  PROT 7.0  ALBUMIN 3.7   Recent Labs  Lab 09/01/2018 1131  LIPASE 35   No results for input(s): AMMONIA in the last 168 hours.  CBC: Recent Labs  Lab 09/12/2018 1131 09/25/18 1010 09/27/18 0810  WBC 10.6* 7.8 6.2  HGB 13.5 13.3 12.7*  HCT 45.0 44.7 42.2  MCV 90.7 90.7 90.0  PLT 159 164 128*    Cardiac Enzymes: No results for input(s): CKTOTAL, CKMB, CKMBINDEX, TROPONINI in the last 168 hours.  BNP: Invalid input(s): POCBNP  CBG: Recent Labs  Lab 09/27/18 1617 09/27/18 2032 09/28/18 0000 09/28/18 0414 09/28/18 0823  GLUCAP 99 100* 105* 94 92    Microbiology: Results for  orders placed or performed during the hospital encounter of 08/25/18  MRSA PCR Screening     Status: None   Collection Time: 08/25/18  2:05 PM  Result Value Ref Range Status   MRSA by PCR NEGATIVE NEGATIVE Final    Comment:        The GeneXpert MRSA Assay (FDA approved for NASAL specimens only), is one component of a comprehensive MRSA colonization surveillance program. It is not intended to diagnose MRSA infection nor to guide or monitor treatment for MRSA infections. Performed at [facility], [place] [address], [place], [place] [address]     Coagulation Studies: No results for input(s): LABPROT, INR in the last 72 hours.  Urinalysis: No results for input(s): COLORURINE, LABSPEC, PHURINE, GLUCOSEU, HGBUR, BILIRUBINUR, KETONESUR, PROTEINUR, UROBILINOGEN, NITRITE, LEUKOCYTESUR in the last 72 hours.  Invalid input(s): APPERANCEUR    Imaging: Dg Abd 2 Views  Result Date: 09/27/2018 CLINICAL DATA:  Small bowel obstruction follow-up. EXAM: ABDOMEN - 2 VIEW COMPARISON:  Multiple KUBs since September 24, 2018. FINDINGS: The distal tip of the NG tube terminates in the right medial lower chest, likely within the hiatal hernia. The side port is in the distal esophagus. Continued dilatation of small bowel loops consistent with known small bowel obstruction, similar in the interval. No free air or portal venous gas. Vascular calcifications  noted. IMPRESSION: 1. Continued small bowel obstruction. 2. The NG tube likely terminates in the hiatal hernia, unchanged. Electronically Signed   By: Dorise Bullion III M.D   On: 09/27/2018 15:22   Dg Abd Acute W/chest  Result Date: 09/28/2018 CLINICAL DATA:  Abdominal pain, distention EXAM: DG ABDOMEN ACUTE W/ 1V CHEST COMPARISON:  09/27/2018 FINDINGS: The NG tube loops in the dilated upper thoracic esophagus, stable. The tip is in the distal esophagus, also stable. Left base atelectasis. Heart is normal size. Continued small bowel dilatation with  air-fluid levels compatible with small bowel obstruction. No visible free air. No significant change since prior study. IMPRESSION: Continued small bowel obstruction pattern. NG tube loops in the dilated thoracic esophagus with the tip in the distal esophagus. Left base atelectasis. Electronically Signed   By: Rolm Baptise M.D.   On: 09/28/2018 08:30     Medications:   . sodium chloride 75 mL/hr at 09/28/18 0001  . famotidine (PEPCID) IV 20 mg (09/27/18 1330)  . methocarbamol (ROBAXIN) IV     . bisacodyl  10 mg Rectal Daily  . brimonidine  1 drop Both Eyes Q12H   And  . timolol  1 drop Both Eyes Q12H  . insulin aspart  0-9 Units Subcutaneous Q4H  . latanoprost  1 drop Both Eyes QHS  . levothyroxine  37.5 mcg Intravenous Daily  . lip balm  1 application Topical BID  . magic mouthwash  15 mL Oral QID   alum & mag hydroxide-simeth, guaiFENesin-dextromethorphan, hydrocortisone, hydrocortisone cream, menthol-cetylpyridinium, methocarbamol (ROBAXIN) IV, phenol  Assessment/ Plan:   Chronic renal disease stage IV baseline 2-2.2.  Currently stable at baseline. On 1/2NS 42mL/hr - continue but watch serum sodium which is currently ok at 137.  He had recent mild hyponatremia.   CT abdomen do not reveal any hydronephrosis not taking any nephrotoxic medications no ACE inhibitors no ARB's.    Hypertension blood pressure appears to be adequately controlled  Anemia not an issue at this time  Bone stable continue to follow calcium phosphorus  Hypothyroidism on replacement  Small bowel obstruction contemplating surgery  Deafness will need sign language interpreter   LOS: 4 Jannifer Hick A @TODAY @12 :11 PM

## 2018-09-28 NOTE — Progress Notes (Signed)
[facility] Progress Note     Subjective: CC-  Resting comfortably, daughter at bedside. Interpretor in room. Patient states that he is doing ok. He reports mild left sided abdominal discomfort, no better or worse than yesterday. Denies n/v. Passing a very small amount of flatus. No BM since admission. Ambulated 4 times in the halls yesterday.  Objective: Vital signs in last 24 hours: Temp:  [97.5 F (36.4 C)-97.9 F (36.6 C)] 97.8 F (36.6 C) (09/30 0418) Pulse Rate:  [68-70] 68 (09/30 0418) Resp:  [16-18] 18 (09/30 0418) BP: (134-150)/(73-74) 134/73 (09/30 0418) SpO2:  [97 %-99 %] 99 % (09/30 0418) Last BM Date: 09/21/18  Intake/Output from previous day: 09/29 0701 - 09/30 0700 In: 2011.9 [I.V.:1911.9; NG/GT:100] Out: 1700 [Urine:1100; Emesis/NG output:600] Intake/Output this shift: No intake/output data recorded.  PE: Gen:  Alert, NAD, pleasant HEENT: EOM's intact, pupils equal and round Card:  RRR Pulm:  CTAB, no W/R/R, effort normal Abd: Soft, mild distension, mild left central TTP, few BS heard Ext:  Calves soft and nontender Psych: A&Ox3  Skin: no rashes noted, warm and dry  Lab Results:  Recent Labs    09/27/18 0810  WBC 6.2  HGB 12.7*  HCT 42.2  PLT 128*   BMET Recent Labs    09/27/18 0810 09/28/18 0811  NA 137 137  K 4.2 4.6  CL 102 103  CO2 21* 20*  GLUCOSE 106* 103*  BUN 92* 86*  CREATININE 2.27* 2.06*  CALCIUM 8.2* 8.6*   PT/INR No results for input(s): LABPROT, INR in the last 72 hours. CMP     Component Value Date/Time   NA 137 09/28/2018 0811   NA 143 09/02/2018 1533   K 4.6 09/28/2018 0811   CL 103 09/28/2018 0811   CO2 20 (L) 09/28/2018 0811   CO2 104 09/27/2015   GLUCOSE 103 (H) 09/28/2018 0811   BUN 86 (H) 09/28/2018 0811   BUN 79 (HH) 09/02/2018 1533   BUN 39 09/27/2015   CREATININE 2.06 (H) 09/28/2018 0811   CREATININE 1.96 (H) 10/23/2016 0920   CALCIUM 8.6 (L) 09/28/2018 0811   CALCIUM 8.8 09/27/2015    PROT 7.0 09/16/2018 1131   ALBUMIN 3.7 09/10/2018 1131   ALBUMIN 3.6 09/27/2015   AST 43 (H) 09/17/2018 1131   ALT 22 09/11/2018 1131   ALKPHOS 62 09/10/2018 1131   BILITOT 0.9 09/19/2018 1131   GFRNONAA 27 (L) 09/28/2018 0811   GFRNONAA 30 (L) 10/23/2016 0920   GFRAA 32 (L) 09/28/2018 0811   GFRAA 35 (L) 10/23/2016 0920   Lipase     Component Value Date/Time   LIPASE 35 09/26/2018 1131       Studies/Results: Dg Abd 2 Views  Result Date: 09/27/2018 CLINICAL DATA:  Small bowel obstruction follow-up. EXAM: ABDOMEN - 2 VIEW COMPARISON:  Multiple KUBs since September 24, 2018. FINDINGS: The distal tip of the NG tube terminates in the right medial lower chest, likely within the hiatal hernia. The side port is in the distal esophagus. Continued dilatation of small bowel loops consistent with known small bowel obstruction, similar in the interval. No free air or portal venous gas. Vascular calcifications noted. IMPRESSION: 1. Continued small bowel obstruction. 2. The NG tube likely terminates in the hiatal hernia, unchanged. Electronically Signed   By: Dorise Bullion III M.D   On: 09/27/2018 15:22   Dg Abd Acute W/chest  Result Date: 09/28/2018 CLINICAL DATA:  Abdominal pain, distention EXAM: DG ABDOMEN ACUTE W/ 1V CHEST COMPARISON:  09/27/2018 FINDINGS: The NG tube loops in the dilated upper thoracic esophagus, stable. The tip is in the distal esophagus, also stable. Left base atelectasis. Heart is normal size. Continued small bowel dilatation with air-fluid levels compatible with small bowel obstruction. No visible free air. No significant change since prior study. IMPRESSION: Continued small bowel obstruction pattern. NG tube loops in the dilated thoracic esophagus with the tip in the distal esophagus. Left base atelectasis. Electronically Signed   By: Rolm Baptise M.D.   On: 09/28/2018 08:30    Anti-infectives: Anti-infectives (From admission, onward)   None        Assessment/Plan HTN DM HLD Hypothyroidism CKD-III H/o DVT Glaucoma Deaf   SBO - CT scan 9/26 showed distal small bowel obstruction likely due to adhesion; large hiatal hernia - f/u xrays did show contrast in colon, but today's xray still shows continued small bowel dilatation with air-fluid levels  ID - none FEN - IVF, NPO/NGT VTE - SCDs, lovenox Foley - none  Plan: Patient with persistent SBO findings on xray. Continue NPO/NGT tube today. Continue ambulating. Repeat Xray in AM. Will discuss with MD, but may need to consider surgery if not improving.   LOS: 4 days    Wellington Hampshire , [facility] 09/28/2018, 10:11 AM Pager: [phone] Mon 7:00 am -11:30 AM Tues-Fri 7:00 am-4:30 pm Sat-Sun 7:00 am-11:30 am

## 2018-09-28 NOTE — Progress Notes (Signed)
Noted PICC order.  Pt CrCl less than 30.  Being seen by Nephrology.  Need clearance from renal before proceeding with PICC.  Floor RN contacted and to page Renal.

## 2018-09-28 NOTE — Progress Notes (Signed)
Per the request of the IV team, paged the Dr. Johnney Ou for approval of the PICC line. Awting her call back for approval of the line placement.

## 2018-09-28 NOTE — Progress Notes (Signed)
Physical Therapy Treatment Patient Details Name: Philip Matthews MRN: [id number] DOB: Dec 29, 1928 Today's Date: 09/28/2018    History of Present Illness 84 year old male with known distal renal tubular acidosis and hyperkalemia, osteoarthritis, hypothyroidism, type 2 diabetes, congenital deafness who presents with small bowel obsturction. His daughter is his primary care giver.     PT Comments    Mr. Hitsman doing well this morning - motivated to work with PT. Patient today able to ambulate around unit with RW at [facility] guard level for general safety - no physical assist required for stability or steadying. ASL interpreter, Manuela Schwartz, present throughout session.  Patient making excellent progress towards goals.    Follow Up Recommendations  Home health PT     Equipment Recommendations  None recommended by PT    Recommendations for Other Services       Precautions / Restrictions Precautions Precautions: Fall Restrictions Weight Bearing Restrictions: No    Mobility  Bed Mobility Overal bed mobility: Modified Independent                Transfers Overall transfer level: Needs assistance Equipment used: Rolling walker (2 wheeled) Transfers: Sit to/from Stand Sit to Stand: Supervision         General transfer comment: for safety; no LOB  Ambulation/Gait Ambulation/Gait assistance: Min guard Gait Distance (Feet): 500 Feet Assistive device: Rolling walker (2 wheeled) Gait Pattern/deviations: Step-through pattern;Trunk flexed;Narrow base of support Gait velocity: WNL   General Gait Details: ambulated entire unit with RW at [place] guard level - no LOB or overt instability   Stairs             Wheelchair Mobility    Modified Rankin (Stroke Patients Only)       Balance Overall balance assessment: Needs assistance Sitting-balance support: No upper extremity supported;Feet supported Sitting balance-Leahy Scale: Good     Standing balance support: Bilateral upper  extremity supported;During functional activity Standing balance-Leahy Scale: Fair                              Cognition Arousal/Alertness: Awake/alert Behavior During Therapy: WFL for tasks assessed/performed Overall Cognitive Status: Within Functional Limits for tasks assessed                                 General Comments: requires ASL interpreter      Exercises      General Comments General comments (skin integrity, edema, etc.): NG in place, but unattached from suction; family/friends present, ASL interpreter present for entirety of session      Pertinent Vitals/Pain Pain Assessment: No/denies pain    Home Living                      Prior Function            PT Goals (current goals can now be found in the care plan section) Acute Rehab PT Goals Patient Stated Goal: to get stronger  PT Goal Formulation: With patient Time For Goal Achievement: 10/03/18 Potential to Achieve Goals: Good Progress towards PT goals: Progressing toward goals    Frequency    Min 3X/week      PT Plan Current plan remains appropriate    Co-evaluation              AM-PAC PT "6 Clicks" Daily Activity  Outcome Measure  Difficulty turning over in  bed (including adjusting bedclothes, sheets and blankets)?: None Difficulty moving from lying on back to sitting on the side of the bed? : A Little Difficulty sitting down on and standing up from a chair with arms (e.g., wheelchair, bedside commode, etc,.)?: A Little Help needed moving to and from a bed to chair (including a wheelchair)?: A Little Help needed walking in hospital room?: A Little Help needed climbing 3-5 steps with a railing? : A Little 6 Click Score: 19    End of Session Equipment Utilized During Treatment: Gait belt Activity Tolerance: Patient tolerated treatment well Patient left: in bed;with nursing/sitter in room;with family/visitor present;with call bell/phone within  reach Nurse Communication: Mobility status PT Visit Diagnosis: Unsteadiness on feet (R26.81);Other abnormalities of gait and mobility (R26.89);Muscle weakness (generalized) (M62.81)     Time: 9371-6967 PT Time Calculation (min) (ACUTE ONLY): 15 min  Charges:  $Gait Training: 8-22 mins                     Lanney Gins, PT, DPT Supplemental Physical Therapist 09/28/18 9:01 AM Pager: [phone] Office: [phone]

## 2018-09-28 NOTE — Progress Notes (Addendum)
PHARMACY - ADULT TOTAL PARENTERAL NUTRITION CONSULT NOTE   Pharmacy Consult for TPN Indication: SBO  Patient Measurements: Weight: 56.7 kg Height 5'5" IBW: 61.5 kg  Assessment: 15 yom admitted 9/26 with persistent SBO likely due to adhesions from previous colon surgery. Per Surgery, not improving and will need to consider ex-lap in next few days.  GI: CT abd - distal SBO, large hiatal hernia, scattered colonic diverticula. +flatus, no BM since admit (last documented 9/23). NGT output ~600 ml/24 hrs. Bisacodyl. Pepcid Endo: T2DM hx (A1c 6.8%). Currently on sensitive SSI q4h but not using. CBGs controlled Insulin requirements in the past 24 hours: 0 units Lytes: all wnl except Ca 8.6 (no recent albumin), CO2 low down to 20. No mag or phos Renal: CKDIV (baseline SCr 2-2.2) - SCr down to 2.06, CrCl~20, BUN down to 86. On 1/2NS at 75 ml/hr Pulm: RA Cards: VSS Hepatobil: AST slightly elevated at 43, ALT/tbili wnl on 9/26 Neuro: ID: no issues Heme: hx DVT, previously on warfarin, no AC currently pta. Dopplers neg for DVT 9/18. SCDs for VTE ppx  TPN Access: pending TPN start date: 09/29/2018 Nutritional Goals (per RD recommendation on pending): KCal:  Protein:  Fluid:  Current Nutrition:  NPO  Plan:  Order placed after cut-off time for today - TPN to be initiated 74/1 per policy F/u PICC placement, TPN labs, dietitian recommendations, and Surgery plans  Elicia Lamp, PharmD, BCPS Clinical Pharmacist Clinical phone [phone] Please check AMION for all [place] contact numbers 09/28/2018 2:26 PM

## 2018-09-28 NOTE — Care Management Important Message (Signed)
Important Message  Patient Details  Name: Philip Matthews MRN: [id number] Date of Birth: 01-09-1928   Medicare Important Message Given:  Yes    Laurann Mcmorris [address] 09/28/2018, 4:35 PM

## 2018-09-28 NOTE — Progress Notes (Signed)
Family Medicine Teaching Service Daily Progress Note Intern Pager: [phone]  Patient name: Philip Matthews Medical record number: [id number] Date of birth: 01-24-1928 Age: 84 y.o. Gender: male  Primary Care Provider: Rory Percy, DO Consultants: surgery,  Code Status: Full code  Pt Overview and Major Events to Date:  Admitted: 09/08/2018  Assessment and Plan: Vicki Pasqual Crill Allenis a 84 y.o.malepresenting with a bowel obstruction. PMH is significant forT2DM, DVT, HTN,peripheral neuropathy, hyperlipidemia, gout, anemia, CKD, and bilateral deafness.  #SBO, stable:Patient reporting that abdomen feels better.  Patient reports that he is having little bouts of flatus and stooling.  He continues to walk the hallways and states this makes him feel better. Patient and family hopeful they do not have to have surgery.  Abdominal x-ray 9/30 shows continued small bowel obstruction pattern. -Surgery following, appreciate recommendations -Continue SBO protocol (NGT, gastrograffin, serial xray).  -NPO - IV zofran PRN, tylenol PRN pain -Ambulate with PT  #Asymptomatic bacteruria, stable: large LA, >50 WBC, many bacteria. 0-5 squams.  She remains asymptomatic-do not treat unless patient has symptoms of UTI  #Deafness - needs ASL interpreter  #DM-II -takes victoza 0.6mg  qd at home. Glucose lebels 100-140 so far. A1c 6.8% on 9/27. 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 Home lasix 40mg /20mg . Calcitriol 0.63mcg MWF, sodium Bicarb 650 qd. Creatinine 2.36>2.27 on 9/30. K 4.2. BUN increasing to 80  - avoid nephrotoxic medications - nephro consulted -monitor BMPs - holding medications while NPO  #HTN -takes hydralazine 50mg  TID, lasix. Pharmacy states we could do PRN hydralazine q4 for severely elevated pressures. If we wanted to do lasix IV, use half dose. - monitor vitals.  - hold meds while NPO  #Hx of hyperkalemia - distal RTA. Takes lokelma 5mg  daily.  Currently 4.1 this AM.  - BMPs - holding while npo  #anemia of chronic disease- Hgb down 13.3>12.7 9/30 but remains generally increased over baseline on this admission. Using procrit 10k unites q2w and ferosul 325mg  daily - CBC daily  #GERD- No complaints today. Was on protonix 20mg  BID at home - starting IV pepcid.   # gout -takes allopurinol 100mg  qd - hold while npo  #hypothyroidism- takes 28mcg daily. Last TSH in august. Pharmacy recommends doing 1/2 dose IV while NPO. -restart at 1/2 dose IV  #hyperlipidemia - pravastatin 40mg  at home - hold while NPO  #primary open angle glaucoma -left severe/right mild. Taking brimonidine-timolol 1 drop BID, latanoprost 1 drop at bedtime.  - continue home meds  #leukocytosis,r esolved- wbc count minimally elevated to 10.6 on admission, 6.2 on 9/30. Temp 96.8 on admission - monitor cbc  #hx of portal vein thrombosis and DVT- was taking coumadin until march 2019. No signs of thrombosis on physical exam this admission.  - monitor for DVT  Electrolytes: replete PRN  Nutrition: NPO GI ppx: IV pepcid DVT px: lovenox 30mg   Disposition: Home pending clinical improvement  Subjective:  Patient seen this morning resting in bed, he states he feels "fine" this morning and reports he is have little flatus and small bowel movements. He continues to be distended. His daughter is in the room with him and has a lot of questions about next steps for him and is curious about surgery. He denies nausea, fevers, chest pain, shortness of breath, and abdominal pain.   Objective: Temp:  [97.5 F (36.4 C)-97.9 F (36.6 C)] 97.8 F (36.6 C) (09/30 0418) Pulse Rate:  [68-70] 68 (09/30 0418) Resp:  [16-18] 18 (09/30 0418) BP: (  134-150)/(73-74) 134/73 (09/30 0418) SpO2:  [97 %-99 %] 99 % (09/30 0418)  Physical Exam  Constitutional: He appears well-developed.  Cachectic, frail  HENT:  Head: Normocephalic.  Eyes: EOM are normal.   Cardiovascular: Normal rate, regular rhythm and normal heart sounds.  Pulmonary/Chest: Effort normal and breath sounds normal.  Abdominal: Normal appearance. Bowel sounds are increased. There is negative Murphy's sign.  Neurological: He is alert.  Skin: Skin is warm and dry. Capillary refill takes less than 2 seconds.  Psychiatric: He has a normal mood and affect.   Laboratory: Recent Labs  Lab 09/13/2018 1131 09/25/18 1010 09/27/18 0810  WBC 10.6* 7.8 6.2  HGB 13.5 13.3 12.7*  HCT 45.0 44.7 42.2  PLT 159 164 128*   Recent Labs  Lab 09/19/2018 1131 09/25/18 1010 09/26/18 0513 09/27/18 0810  NA 139 141 139 137  K 5.0 5.3* 4.1 4.2  CL 99 100 101 102  CO2 26 30 25  21*  BUN 72* 83* 87* 92*  CREATININE 2.25* 2.46* 2.36* 2.27*  CALCIUM 8.7* 9.0 8.3* 8.2*  PROT 7.0  --   --   --   BILITOT 0.9  --   --   --   ALKPHOS 62  --   --   --   ALT 22  --   --   --   AST 43*  --   --   --   GLUCOSE 215* 156* 120* 106*   Imaging/Diagnostic Tests: Ct Abdomen Pelvis Wo Contrast  Result Date: 09/14/2018 CLINICAL DATA:  Constipation for the past 3 days. EXAM: CT ABDOMEN AND PELVIS WITHOUT CONTRAST TECHNIQUE: Multidetector CT imaging of the abdomen and pelvis was performed following the standard protocol without IV contrast. COMPARISON:  Abdomen pelvis radiographs obtained earlier today. Abdomen ultrasound dated 04/01/2011. FINDINGS: Lower chest: Large hiatal hernia. Small bilateral pleural effusions and associated mild bibasilar atelectasis. Atheromatous coronary artery calcifications. Hepatobiliary: No focal liver abnormality is seen. No gallstones, gallbladder wall thickening, or biliary dilatation. Pancreas: Unremarkable. No pancreatic ductal dilatation or surrounding inflammatory changes. Spleen: Normal in size without focal abnormality. Adrenals/Urinary Tract: Both kidneys are small. Normal appearing adrenal glands, urinary bladder and ureters. Stomach/Bowel: Normal caliber colon containing  multiple diverticula. Diffusely dilated small bowel and stomach. Vascular/Lymphatic: Right upper quadrant vascular coils. Extensive atheromatous arterial calcifications. No enlarged lymph nodes. Reproductive: Mildly enlarged prostate gland. Other: Anterior abdominal wall surgical sutures. Small amount of free peritoneal fluid. Small left inguinal hernia containing fat. Musculoskeletal: Diffuse osteopenia. Lumbar and lower thoracic spine degenerative changes. IMPRESSION: 1. Distal small bowel obstruction. The cause of obstruction is not identified, most likely due to an adhesion. 2. Large hiatal hernia. 3. Small bilateral pleural effusions and associated mild bibasilar atelectasis. 4. Extensive atheromatous arterial calcifications, including the coronary arteries. 5. Mild prostatic hypertrophy. 6. Scattered colonic diverticula. Electronically Signed   By: Claudie Revering M.D.   On: 09/14/2018 17:08   Dg Abd 2 Views  Result Date: 09/27/2018 CLINICAL DATA:  Small bowel obstruction follow-up. EXAM: ABDOMEN - 2 VIEW COMPARISON:  Multiple KUBs since September 24, 2018. FINDINGS: The distal tip of the NG tube terminates in the right medial lower chest, likely within the hiatal hernia. The side port is in the distal esophagus. Continued dilatation of small bowel loops consistent with known small bowel obstruction, similar in the interval. No free air or portal venous gas. Vascular calcifications noted. IMPRESSION: 1. Continued small bowel obstruction. 2. The NG tube likely terminates in the hiatal hernia, unchanged. Electronically  Signed   By: Dorise Bullion III M.D   On: 09/27/2018 15:22   Dg Abd 2 Views  Result Date: 09/14/2018 CLINICAL DATA:  Left-sided abdominal pain, constipation. EXAM: ABDOMEN - 2 VIEW COMPARISON:  Radiographs of March 20, 2018. FINDINGS: Dilated small bowel loops are seen in the left side of the abdomen. Moderate amount of stool is noted in the colon. Surgical clips are seen in the left upper  quadrant. Vascular calcifications are noted. IMPRESSION: Dilated small bowel loops are seen in the left side of the abdomen concerning for distal small bowel obstruction. Continued radiographic follow-up is recommended. Moderate stool burden is noted. Electronically Signed   By: Marijo Conception, M.D.   On: 09/12/2018 10:42   Dg Abd Acute W/chest  Result Date: 09/28/2018 CLINICAL DATA:  Abdominal pain, distention EXAM: DG ABDOMEN ACUTE W/ 1V CHEST COMPARISON:  09/27/2018 FINDINGS: The NG tube loops in the dilated upper thoracic esophagus, stable. The tip is in the distal esophagus, also stable. Left base atelectasis. Heart is normal size. Continued small bowel dilatation with air-fluid levels compatible with small bowel obstruction. No visible free air. No significant change since prior study. IMPRESSION: Continued small bowel obstruction pattern. NG tube loops in the dilated thoracic esophagus with the tip in the distal esophagus. Left base atelectasis. Electronically Signed   By: Rolm Baptise M.D.   On: 09/28/2018 08:30   Dg Abd Portable 1v-small Bowel Obstruction Protocol-initial, 8 Hr Delay  Result Date: 09/26/2018 CLINICAL DATA:  SBO, 8 hour delay EXAM: PORTABLE ABDOMEN - 1 VIEW COMPARISON:  09/01/2018 FINDINGS: Enteric tube overlies the right lower mediastinum, likely within the patient's hiatal hernia when correlating with CT. Multiple dilated loops of small bowel in the left mid abdomen. Contrast opacifies distal small bowel in the right lower quadrant, possibly extending to the cecum. Moderate stool in the right colon. Surgical clips in the upper abdomen. Degenerative changes of the lumbar spine. IMPRESSION: Multiple dilated loops of small bowel in the left mid abdomen, compatible with small bowel obstruction. Contrast opacifies distal small bowel in the right lower quadrant, possibly extending to the cecum. Electronically Signed   By: Julian Hy M.D.   On: 09/26/2018 02:50   Dg Abd Portable  1v-small Bowel Protocol-position Verification  Result Date: 09/07/2018 CLINICAL DATA:  Enteric tube placement, follow-up small-bowel obstruction EXAM: PORTABLE ABDOMEN - 1 VIEW COMPARISON:  09/27/2018 CT abdomen/pelvis FINDINGS: Enteric tube terminates in the right lower mediastinum. Diffuse prominent small bowel dilatation throughout the abdomen, not appreciably changed. Enteric contrast is noted within mid pelvic bowel loops. No evidence of pneumatosis or pneumoperitoneum. Surgical clips are noted in the upper abdomen. No radiopaque nephrolithiasis. IMPRESSION: 1. Enteric tube terminates in the right lower mediastinum, either within the lower thoracic esophagus or within the herniated portion of the stomach. 2. No appreciable change in diffuse prominent small bowel dilatation. Enteric contrast is noted within mid pelvic bowel loops, either within pelvic small bowel and/or distal colon (oral contrast was present within the distal colon and rectum on the CT study performed earlier today). Findings suggest stable partial distal small bowel obstruction. Electronically Signed   By: Ilona Sorrel M.D.   On: 09/19/2018 22:55     Daisy Floro, DO 09/28/2018, 9:05 AM PGY-1, [place] Intern pager: [phone], text pages welcome

## 2018-09-28 NOTE — Progress Notes (Signed)
Power line, and not a PICC per Dr. Johnney Ou.  Will notify Dr. Georgette Dover .Marland Kitchen  Pt also has a small solid BM just now.

## 2018-09-29 ENCOUNTER — Telehealth: Payer: Self-pay

## 2018-09-29 ENCOUNTER — Inpatient Hospital Stay (HOSPITAL_COMMUNITY): Payer: Medicare Other

## 2018-09-29 LAB — DIFFERENTIAL
Abs Immature Granulocytes: 0 10*3/uL (ref 0.0–0.1)
Basophils Absolute: 0 10*3/uL (ref 0.0–0.1)
Basophils Relative: 0 %
EOS ABS: 0.1 10*3/uL (ref 0.0–0.7)
EOS PCT: 3 %
Immature Granulocytes: 0 %
LYMPHS ABS: 0.5 10*3/uL — AB (ref 0.7–4.0)
LYMPHS PCT: 9 %
MONOS PCT: 10 %
Monocytes Absolute: 0.5 10*3/uL (ref 0.1–1.0)
Neutro Abs: 4.1 10*3/uL (ref 1.7–7.7)
Neutrophils Relative %: 78 %

## 2018-09-29 LAB — GLUCOSE, CAPILLARY
GLUCOSE-CAPILLARY: 141 mg/dL — AB (ref 70–99)
Glucose-Capillary: 99 mg/dL (ref 70–99)
Glucose-Capillary: 90 mg/dL (ref 70–99)
Glucose-Capillary: 162 mg/dL — ABNORMAL HIGH (ref 70–99)
Glucose-Capillary: 86 mg/dL (ref 70–99)

## 2018-09-29 LAB — COMPREHENSIVE METABOLIC PANEL
ALK PHOS: 57 U/L (ref 38–126)
ALT: 14 U/L (ref 0–44)
AST: 21 U/L (ref 15–41)
Albumin: 2.6 g/dL — ABNORMAL LOW (ref 3.5–5.0)
Anion gap: 12 (ref 5–15)
BILIRUBIN TOTAL: 1.6 mg/dL — AB (ref 0.3–1.2)
BUN: 77 mg/dL — ABNORMAL HIGH (ref 8–23)
CALCIUM: 8.3 mg/dL — AB (ref 8.9–10.3)
CO2: 20 mmol/L — ABNORMAL LOW (ref 22–32)
CREATININE: 2 mg/dL — AB (ref 0.61–1.24)
Chloride: 105 mmol/L (ref 98–111)
GFR calc non Af Amer: 28 mL/min — ABNORMAL LOW (ref >60)
GFR, EST AFRICAN AMERICAN: 33 mL/min — AB (ref >60)
Glucose, Bld: 101 mg/dL — ABNORMAL HIGH (ref 70–99)
Potassium: 4.1 mmol/L (ref 3.5–5.1)
Sodium: 137 mmol/L (ref 135–145)
TOTAL PROTEIN: 5.8 g/dL — AB (ref 6.5–8.1)

## 2018-09-29 LAB — CBC
HEMATOCRIT: 39.7 % (ref 39.0–52.0)
Hemoglobin: 12.1 g/dL — ABNORMAL LOW (ref 13.0–17.0)
MCH: 27.4 pg (ref 26.0–34.0)
MCHC: 30.5 g/dL (ref 30.0–36.0)
MCV: 90 fL (ref 78.0–100.0)
PLATELETS: 114 10*3/uL — AB (ref 150–400)
RBC: 4.41 MIL/uL (ref 4.22–5.81)
RDW: 16.3 % — AB (ref 11.5–15.5)
WBC: 5.3 10*3/uL (ref 4.0–10.5)

## 2018-09-29 LAB — TRIGLYCERIDES: Triglycerides: 83 mg/dL (ref <150)

## 2018-09-29 LAB — MAGNESIUM: MAGNESIUM: 1.8 mg/dL (ref 1.7–2.4)

## 2018-09-29 LAB — PREALBUMIN: PREALBUMIN: 8.8 mg/dL — AB (ref 18–38)

## 2018-09-29 LAB — PHOSPHORUS: Phosphorus: 4 mg/dL (ref 2.5–4.6)

## 2018-09-29 MED ORDER — BOOST / RESOURCE BREEZE PO LIQD CUSTOM
1.0000 | Freq: Three times a day (TID) | ORAL | Status: DC
  Administered 2018-09-29 (×3): 1 via ORAL

## 2018-09-29 NOTE — Evaluation (Signed)
Occupational Therapy Evaluation Patient Details Name: Philip Matthews MRN: [id number] DOB: 10-11-1928 Today's Date: 09/29/2018    History of Present Illness 84 year old male with known distal renal tubular acidosis and hyperkalemia, osteoarthritis, hypothyroidism, type 2 diabetes, congenital deafness who presents with small bowel obsturction. His daughter is his primary care giver.    Clinical Impression   PTA, pt was living with his daughter and was independent with ADLs and using a rollator. Currently, pt requires Mod-Max A for LB ADLs and Min Guard A for functional mobility using RW. Pt presenting with decreased ROM at hips for LB ADLs due to discomfort at abdomen. Pt would benefit from further acute OT to facilitate safe dc. Recommend dc to home once medically stable per physician.   Follow Up Recommendations  No OT follow up;Supervision/Assistance - 24 hour    Equipment Recommendations  None recommended by OT    Recommendations for Other Services PT consult     Precautions / Restrictions Precautions Precautions: Fall Restrictions Weight Bearing Restrictions: No      Mobility Bed Mobility               General bed mobility comments: Sitting at EOB with PTA  Transfers Overall transfer level: Needs assistance Equipment used: Rolling walker (2 wheeled) Transfers: Sit to/from Stand Sit to Stand: Supervision         General transfer comment: for safety; no LOB    Balance Overall balance assessment: Needs assistance Sitting-balance support: No upper extremity supported;Feet supported Sitting balance-Leahy Scale: Good     Standing balance support: Bilateral upper extremity supported;During functional activity Standing balance-Leahy Scale: Fair                             ADL either performed or assessed with clinical judgement   ADL Overall ADL's : Needs assistance/impaired Eating/Feeding: Set up;Supervision/ safety;Sitting   Grooming: Set  up;Supervision/safety;Sitting   Upper Body Bathing: Set up;Supervision/ safety;Sitting   Lower Body Bathing: Moderate assistance;Sit to/from stand Lower Body Bathing Details (indicate cue type and reason): Decreased bending forward due to discomfort at abdomen Upper Body Dressing : Set up;Supervision/safety;Sitting Upper Body Dressing Details (indicate cue type and reason): donned second gown like a jacket Lower Body Dressing: Maximal assistance;Sit to/from stand Lower Body Dressing Details (indicate cue type and reason): Max A for donning shoes Toilet Transfer: Min guard;Ambulation;RW Toilet Transfer Details (indicate cue type and reason): Simulated to chair. Mi nGuard A for safety Toileting- Clothing Manipulation and Hygiene: Min guard;Sit to/from stand Toileting - Clothing Manipulation Details (indicate cue type and reason): Min Guard A for safety. Pt performing peri care     Functional mobility during ADLs: Min guard;Rolling walker General ADL Comments: Pt with decreased performance of LB ADLs due to discomfort bending forward     Vision         Perception     Praxis      Pertinent Vitals/Pain Pain Assessment: Faces Faces Pain Scale: Hurts a little bit Pain Location: right abdomen reprots minor soreness  Pain Descriptors / Indicators: Aching Pain Intervention(s): Monitored during session;Limited activity within patient's tolerance;Repositioned     Hand Dominance Right   Extremity/Trunk Assessment Upper Extremity Assessment Upper Extremity Assessment: Overall WFL for tasks assessed   Lower Extremity Assessment Lower Extremity Assessment: Overall WFL for tasks assessed   Cervical / Trunk Assessment Cervical / Trunk Assessment: Normal   Communication Communication Communication: Deaf;Other (comment)(Family present)   Cognition Arousal/Alertness: Awake/alert  Behavior During Therapy: WFL for tasks assessed/performed Overall Cognitive Status: Within Functional  Limits for tasks assessed                                 General Comments: Very pleasant   General Comments  Daughter present throughout session. Interpretting used    Exercises     Shoulder Instructions      Home Living Family/patient expects to be discharged to:: Private residence Living Arrangements: Children Available Help at Discharge: Family;Available 24 hours/day Type of Home: House Home Access: Stairs to enter [employer] of Steps: 3 Entrance Stairs-Rails: Can reach both Home Layout: One level     Bathroom Shower/Tub: Corporate investment banker: Standard     Home Equipment: Clinical cytogeneticist - 4 wheels(rollator)          Prior Functioning/Environment Level of Independence: Independent with assistive device(s)        Comments: Perform ADLs at home. walked with and without a rollator. Only walked limited distances.         OT Problem List: Decreased strength;Decreased range of motion;Decreased activity tolerance;Impaired balance (sitting and/or standing);Decreased knowledge of use of DME or AE;Decreased knowledge of precautions      OT Treatment/Interventions: Self-care/ADL training;Therapeutic exercise;Energy conservation;DME and/or AE instruction;Patient/family education    OT Goals(Current goals can be found in the care plan section) Acute Rehab OT Goals Patient Stated Goal: to get stronger  OT Goal Formulation: With patient Time For Goal Achievement: 10/13/18 Potential to Achieve Goals: Good  OT Frequency: Min 2X/week   Barriers to D/C:            Co-evaluation PT/OT/SLP Co-Evaluation/Treatment: Yes Reason for Co-Treatment: For patient/therapist safety;To address functional/ADL transfers   OT goals addressed during session: ADL's and self-care      AM-PAC PT "6 Clicks" Daily Activity     Outcome Measure Help from another person eating meals?: None Help from another person taking care of personal  grooming?: None Help from another person toileting, which includes using toliet, bedpan, or urinal?: A Little Help from another person bathing (including washing, rinsing, drying)?: A Lot Help from another person to put on and taking off regular upper body clothing?: None Help from another person to put on and taking off regular lower body clothing?: A Lot 6 Click Score: 19   End of Session Equipment Utilized During Treatment: Rolling walker Nurse Communication: Mobility status  Activity Tolerance: Patient tolerated treatment well Patient left: in chair;with call bell/phone within reach;with family/visitor present  OT Visit Diagnosis: Unsteadiness on feet (R26.81);Other abnormalities of gait and mobility (R26.89);Muscle weakness (generalized) (M62.81)                Time: 6599-3570 OT Time Calculation (min): 24 min Charges:  OT General Charges $OT Visit: 1 Visit OT Evaluation $OT Eval Low Complexity: [place], OTR/L Acute Rehab Pager: [phone] Office: [place] 09/29/2018, 5:23 PM

## 2018-09-29 NOTE — Progress Notes (Addendum)
Family Medicine Teaching Service Daily Progress Note Intern Pager: [phone]  Patient name: Philip Matthews Medical record number: [id number] Date of birth: 1928/10/28 Age: 84 y.o. Gender: male  Primary Care Provider: Rory Percy, DO Consultants: surgery,  Code Status: Full code  Pt Overview and Major Events to Date:  Admitted: 09/27/2018  Assessment and Plan: Philip Matthews a 84 y.o.malepresenting with a bowel obstruction. PMH is significant forT2DM, DVT, HTN,peripheral neuropathy, hyperlipidemia, gout, anemia, CKD, and bilateral deafness.  #SBO, Stable TPN recommended by surgery yesterday, not yet started.  Repeat XR showed continue obstructed small bowel pattern. Surgery states that even if operate today, will not regain bowel function for several weeks. This AM removed NG tube.  Denies complaints.  Had 2 BMs and flatus yesterday, none today.  -Surgery following, appreciate recs -Continue SBO protocol (NGT, gastrograffin, serial xray).  -NPO - IV zofran PRN, tylenol PRN pain -Ambulate with PT  #Asymptomatic bacteruria, stable Remains asymptomatic - will not treat at this time  #Deafness - needs ASL interpreter  #DM-II takes victoza 0.6mg  qd at home. A1c 6.8% on 9/27. CBG this AM 90. 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 Home lasix 40mg /20mg . Calcitriol 0.73mcg MWF, sodium Bicarb 650 qd. Nephro recommends monitoring electrolytes, no current Creatinine 2.27> 2.00. K this AM 4.1. BUN 80> 77. - avoid nephrotoxic medications - nephro consulted - monitor BMPs - holding medications while NPO  #HTN: chronic BP this AM 132/70.  takes hydralazine 50mg  TID, lasix. Pharmacy states could do PRN hydralazine q4 for severely elevated pressures. If needs lasix IV, use half dose. - monitor vitals.  - hold meds while NPO  #Hx of hyperkalemia distal RTA. Takes lokelma 5mg  daily. K this AM 4.1.  - BMPs - holding while npo  #anemia of  chronic disease: Chronic Basleine ~8-10.  Hgb this AM 12.1, down from 12.7.  Using procrit 10k unites q2w and ferosul 325mg  daily. - cont CBC monitoring - will call [facility] today to verify last dose of procrit as family is concerned and believes he needs another dose - will reach out to nephro regarding need for procrit at present  #GERD: Chronic, stable Was on protonix 20mg  BID at home - cont IV pepcid.   # gout: Chronic No signs of acute Gouty attack.  takes allopurinol 100mg  qd at home. - hold while npo  #hypothyroidism: Chronic takes 59mcg daily at home. Last TSH in august. Pharmacy recommends doing 1/2 dose IV while NPO. -restart at 1/2 dose IV  #hyperlipidemia - pravastatin 40mg  at home - hold while NPO  #primary open angle glaucoma: Chronic, severe  OS severe/OD mild. Taking brimonidine-timolol 1 drop BID, latanoprost 1 drop at bedtime.  - continue home meds  #leukocytosis: resolved Wbc on admission 10.6, this AM 4.1.  - monitor CBC  #hx of portal vein thrombosis and DVT- was taking coumadin until march 2019. No signs of thrombosis on physical exam this admission.  - monitor for DVT - lovenox ppx 30mg    Electrolytes: replete PRN  Nutrition: NPO GI ppx: IV pepcid DVT px: lovenox 30mg   Disposition: home pending clinical improvement  Subjective:  Patient feeling well this AM.  No abdominal pain.  No BM or flatulence since yesterday, had 2 BMs. No N/V since NG tube removed by patient this AM.  Objective: Temp:  [97.4 F (36.3 C)-97.8 F (36.6 C)] 97.8 F (36.6 C) (10/01 0451) Pulse Rate:  [63-68] 65 (10/01 0451) Resp:  [16-18] 16 (10/01 0451)  BP: (113-157)/(65-70) 132/70 (10/01 0451) SpO2:  [99 %-100 %] 100 % (10/01 0451) Weight:  [56.7 kg] 56.7 kg (09/30 1400)  Physical Exam: General: 84 y.o. male in NAD Cardio: RRR no m/r/g Lungs: CTAB, no wheezing, no rhonchi, no crackles Abdomen: Soft, non-tender to palpation, increased bowel sounds on  right, decreased on left abd Skin: warm and dry Extremities: No edema   Laboratory: Recent Labs  Lab 09/25/18 1010 09/27/18 0810 09/29/18 0646  WBC 7.8 6.2 5.3  HGB 13.3 12.7* 12.1*  HCT 44.7 42.2 39.7  PLT 164 128* PENDING   Recent Labs  Lab 09/04/2018 1131  09/27/18 0810 09/28/18 0811 09/29/18 0646  NA 139   < > 137 137 137  K 5.0   < > 4.2 4.6 4.1  CL 99   < > 102 103 105  CO2 26   < > 21* 20* 20*  BUN 72*   < > 92* 86* 77*  CREATININE 2.25*   < > 2.27* 2.06* 2.00*  CALCIUM 8.7*   < > 8.2* 8.6* 8.3*  PROT 7.0  --   --   --  5.8*  BILITOT 0.9  --   --   --  1.6*  ALKPHOS 62  --   --   --  57  ALT 22  --   --   --  14  AST 43*  --   --   --  21  GLUCOSE 215*   < > 106* 103* 101*   < > = values in this interval not displayed.   Imaging/Diagnostic Tests: Dg Abd Portable 1v  Result Date: 09/29/2018 CLINICAL DATA:  Small bowel obstruction EXAM: PORTABLE ABDOMEN - 1 VIEW COMPARISON:  09/28/2018 FINDINGS: Dilated small bowel loops are again noted throughout the abdomen and pelvis, not significantly changed since prior study compatible with small bowel obstruction. No visible evidence of free air on the supine view. No organomegaly. IMPRESSION: Continued small bowel obstruction pattern, stable. Electronically Signed   By: Philip Matthews M.D.   On: 09/29/2018 08:07   [place] Ekg Site Rite  Result Date: 09/28/2018 If Site Rite image not attached, placement could not be confirmed due to current cardiac rhythm.    [place], DO 09/29/2018, 9:42 AM PGY-1, [place] Intern pager: [phone], text pages welcome

## 2018-09-29 NOTE — Progress Notes (Signed)
Initial Nutrition Assessment  DOCUMENTATION CODES:   Not applicable  INTERVENTION:   -Continue Boost Breeze po TID, each supplement provides 250 kcal and 9 grams of protein -RD will follow for diet advancement and adjust supplement regimen as appropriate -If prolonged clear liquid/NPO status is anticipated, consider initiation of nutrition support  NUTRITION DIAGNOSIS:   Inadequate oral intake related to altered GI function as evidenced by NPO status.  GOAL:   Patient will meet greater than or equal to 90% of their needs  MONITOR:   PO intake, Supplement acceptance, Diet advancement, Labs, Weight trends, Skin, I & O's  REASON FOR ASSESSMENT:   Consult New TPN/TNA  ASSESSMENT:   RAIF Philip Matthews is a 84 y.o. male presenting with a bowel obstruction. PMH is significant for T2DM, DVT, HTN, peripheral neuropathy, hyperlipidemia, gout, anemia, CKD, and bilateral deafness.  Pt admitted with SBO.   9/26- NGT inserted 10/1- pt pulled NGT and refused re-insertion  Attempted to see pt x 2, however, pt either sleeping soundly or in with multiple family members at time of visit.   Case discussed with RN, who reports plan to hold off on TPN (TPN was not started due to access never being placed). RN reports that MD does not want to pursue surgery at this time. Pt has been advanced to sips and chips; per RN pt has Boost Breeze ordered and is tolerating well. Pt has also had two small bowel movements today.   Reviewed wt hx; noted wt has been stable within the past year.   Labs reviewed: CBGS: 90-141 (inpatient orders for glycemic control are 0-9 units insulin aspart every 4 hours).   NUTRITION - FOCUSED PHYSICAL EXAM:    Most Recent Value  Orbital Region  Unable to assess  Upper Arm Region  Unable to assess  Thoracic and Lumbar Region  Unable to assess  Buccal Region  Unable to assess  Temple Region  Unable to assess  Clavicle Bone Region  Unable to assess  Clavicle and Acromion  Bone Region  Unable to assess  Scapular Bone Region  Unable to assess  Dorsal Hand  Unable to assess  Patellar Region  Unable to assess  Anterior Thigh Region  Unable to assess  Posterior Calf Region  Unable to assess  Edema (RD Assessment)  Unable to assess  Hair  Unable to assess  Eyes  Unable to assess  Mouth  Unable to assess  Skin  Unable to assess  Nails  Unable to assess       Diet Order:   Diet Order            Diet NPO time specified  Diet effective midnight        Diet NPO time specified Except for: Ice Chips, Sips with Meds, Other (See Comments)  Diet effective now              EDUCATION NEEDS:   No education needs have been identified at this time  Skin:  Skin Assessment: Reviewed RN Assessment  Last BM:  09/28/18  Height:   Ht Readings from Last 1 Encounters:  09/28/18 5\' 5"  (1.651 m)    Weight:   Wt Readings from Last 1 Encounters:  09/28/18 56.7 kg    Ideal Body Weight:  61.8 kg  BMI:  Body mass index is 20.8 kg/m.  Estimated Nutritional Needs:   Kcal:  1500-1700  Protein:  70-85 grams  Fluid:  1.5-1.7 L    Jashaun Penrose A. Jimmye Norman, RD,  LDN, CDE Pager: [phone] After hours Pager: [phone]

## 2018-09-29 NOTE — Progress Notes (Signed)
Pt's NG tube out this morning. Via video interpreter, pt expressed that he could not stand the tube. He says it hurts his throat and he cannot sleep with it; will not allow [place] to reinsert the NG. Dr. Ninfa Linden notified. He will let the primary team address the issue. Pt's abdomen is distended,but he has some bowel sounds at this time.He had a BM yesterday per report;no nausea,vomiting at this time.

## 2018-09-29 NOTE — Telephone Encounter (Signed)
Willette Cluster, with [facility], called nurse line stating they need a physicians signature on the home health skilled nursing order. This has not been scanned into patients chart yet. Willette Cluster will fax [place] the order, just needs to signed and faxed back.   Just keep a look out in your box.

## 2018-09-29 NOTE — Progress Notes (Signed)
Physical Therapy Treatment Patient Details Name: Philip Matthews MRN: [id number] DOB: [birth year] Today's Date: 09/29/2018    History of Present Illness 84 year old male with known distal renal tubular acidosis and hyperkalemia, osteoarthritis, hypothyroidism, type 2 diabetes, congenital deafness who presents with small bowel obsturction. His daughter is his primary care giver.     PT Comments    Pt performed gait training and functional mobility during session.  ON arrival patient soiled in urine and required clean up.  OT assisted in pericare and donning shoes and new gown.  Pt continues to ambulate well but noticeably when and fatigued during therapeutic exercises.  Will continue PT per POC.    Follow Up Recommendations  Home health PT     Equipment Recommendations  None recommended by PT    Recommendations for Other Services Rehab consult     Precautions / Restrictions Precautions Precautions: Fall Restrictions Weight Bearing Restrictions: No    Mobility  Bed Mobility Overal bed mobility: Needs Assistance Bed Mobility: Rolling;Sidelying to Sit Rolling: Min guard Sidelying to sit: Min assist       General bed mobility comments: Tactile cues and gestures for hand placement  Transfers Overall transfer level: Needs assistance Equipment used: Rolling walker (2 wheeled) Transfers: Sit to/from Stand Sit to Stand: Supervision         General transfer comment: for safety; no LOB, cues provided for hand placement via demonstration  Ambulation/Gait Ambulation/Gait assistance: Min guard Gait Distance (Feet): 200 Feet Assistive device: Rolling walker (2 wheeled) Gait Pattern/deviations: Step-through pattern;Trunk flexed;Narrow base of support Gait velocity: WNL   General Gait Details: Increased speed, difficulty negotiating obstacles in tight spaces.  No LOB, fatigue noted.     Stairs             Wheelchair Mobility    Modified Rankin (Stroke Patients  Only)       Balance Overall balance assessment: Needs assistance Sitting-balance support: No upper extremity supported;Feet supported Sitting balance-Leahy Scale: Good     Standing balance support: Bilateral upper extremity supported;During functional activity Standing balance-Leahy Scale: Fair                              Cognition Arousal/Alertness: Awake/alert Behavior During Therapy: WFL for tasks assessed/performed Overall Cognitive Status: Within Functional Limits for tasks assessed                                 General Comments: Very pleasant      Exercises Total Joint Exercises Ankle Circles/Pumps: AROM;Both;10 reps;Supine Gluteal Sets: 10 reps;AROM;Both;Supine Long Arc Quad: AROM;Both;10 reps;Supine General Exercises - Lower Extremity Hip ABduction/ADduction: AROM;Both;10 reps;Seated Hip Flexion/Marching: AROM;Both;10 reps;Seated    General Comments General comments (skin integrity, edema, etc.): Daughter present throughout session. Interpretting used      Pertinent Vitals/Pain Pain Assessment: Faces Faces Pain Scale: Hurts a little bit Pain Location: right abdomen reprots minor soreness  Pain Descriptors / Indicators: Aching Pain Intervention(s): Repositioned    Home Living Family/patient expects to be discharged to:: Private residence Living Arrangements: Children Available Help at Discharge: Family;Available 24 hours/day Type of Home: House Home Access: Stairs to enter Entrance Stairs-Rails: Can reach both Home Layout: One level Home Equipment: Clinical cytogeneticist - 4 wheels(rollator)      Prior Function Level of Independence: Independent with assistive device(s)      Comments: Perform ADLs at home. walked with  and without a rollator. Only walked limited distances.    PT Goals (current goals can now be found in the care plan section) Acute Rehab PT Goals Patient Stated Goal: to get stronger  Potential to Achieve  Goals: Good Progress towards PT goals: Progressing toward goals    Frequency    Min 3X/week      PT Plan Current plan remains appropriate    Co-evaluation PT/OT/SLP Co-Evaluation/Treatment: Yes Reason for Co-Treatment: Complexity of the patient's impairments (multi-system involvement);Other (comment)(interpretation) PT goals addressed during session: Mobility/safety with mobility;Balance;Strengthening/ROM OT goals addressed during session: ADL's and self-care      AM-PAC PT "6 Clicks" Daily Activity  Outcome Measure  Difficulty turning over in bed (including adjusting bedclothes, sheets and blankets)?: None Difficulty moving from lying on back to sitting on the side of the bed? : A Little Difficulty sitting down on and standing up from a chair with arms (e.g., wheelchair, bedside commode, etc,.)?: A Little Help needed moving to and from a bed to chair (including a wheelchair)?: A Little Help needed walking in hospital room?: A Little Help needed climbing 3-5 steps with a railing? : A Little 6 Click Score: 19    End of Session   Activity Tolerance: Patient tolerated treatment well Patient left: in bed;with nursing/sitter in room;with family/visitor present;with call bell/phone within reach Nurse Communication: Mobility status PT Visit Diagnosis: Unsteadiness on feet (R26.81);Other abnormalities of gait and mobility (R26.89);Muscle weakness (generalized) (M62.81)     Time: 1975-8832 PT Time Calculation (min) (ACUTE ONLY): 26 min  Charges:  $Gait Training: 8-22 mins                     Governor Rooks, PTA Acute Rehabilitation Services Pager [phone] Office [phone]     Carmin Dibartolo Eli Hose 09/29/2018, 5:32 PM

## 2018-09-29 NOTE — Progress Notes (Signed)
PHARMACY - ADULT TOTAL PARENTERAL NUTRITION CONSULT NOTE   Pharmacy Consult for TPN Indication: SBO  Patient Measurements: Weight: 56.7 kg Height 5'5" IBW: 61.5 kg  Assessment: 70 yom admitted 9/26 with persistent SBO likely due to adhesions from previous colon surgery. Per Surgery, not improving and will need to consider ex-lap in next few days.  GI: Pre-albumin low at 8.8. CT abd - distal SBO, large hiatal hernia, scattered colonic diverticula. +small amount of flatus, now having BMs 9/30. NGT came out overnight - no N/V. Considering surgery 10/1 or 10/2 per MD notes. Bisacodyl. Pepcid Endo: T2DM hx (A1c 6.8%). Currently on sensitive SSI q4h but not using. CBGs controlled Insulin requirements in the past 24 hours: 0 units Lytes: all wnl except CO2 low, stable at 20 Renal: CKDIV (baseline SCr 2-2.2) - SCr down to 2.0, CrCl~21, BUN down to 77, UOP 0.5 cc/kg/hr. On 1/2NS at 75 ml/hr Pulm: RA Cards: mostly VSS Hepatobil: LFTs normalized, Tbili high at 1.6. TG wnl Neuro: no issues ID: no issues Heme: hx DVT, previously on warfarin, no AC currently pta. Dopplers neg for DVT 9/18. SCDs for VTE ppx  TPN Access: pending - Surgery placed order for tunneled central line today, f/u placement TPN start date: 09/29/2018 Nutritional Goals (per RD recommendation on pending): KCal:  Protein:  Fluid:   Current Nutrition:  NPO except sips with clears  Plan:  TPN consult d/c'd today, access never placed. Will d/c TPN labs, orders  Elicia Lamp, PharmD, BCPS Clinical Pharmacist Clinical phone [phone] Please check AMION for all [facility] contact numbers 09/29/2018 9:01 AM

## 2018-09-29 NOTE — Progress Notes (Addendum)
[facility] Progress Note     Subjective: CC-  Patient sitting in bed, daughter at bedside. NG tube came out last night. Patient denies any increased abdominal pain, nausea, or vomiting. He had 2 good BMs yesterday. Passing very small amount of flatus this morning.  Objective: Vital signs in last 24 hours: Temp:  [97.4 F (36.3 C)-97.8 F (36.6 C)] 97.8 F (36.6 C) (10/01 0451) Pulse Rate:  [63-68] 65 (10/01 0451) Resp:  [16-18] 16 (10/01 0451) BP: (113-157)/(65-70) 132/70 (10/01 0451) SpO2:  [99 %-100 %] 100 % (10/01 0451) Weight:  [56.7 kg] 56.7 kg (09/30 1400) Last BM Date: 09/28/18  Intake/Output from previous day: 09/30 0701 - 10/01 0700 In: 1667.2 [I.V.:1517.2; NG/GT:100; IV Piggyback:50] Out: 650 [Urine:650] Intake/Output this shift: No intake/output data recorded.  PE: Gen: Alert, NAD, pleasant HEENT: EOM's intact, pupils equal and round Card: RRR Pulm: CTAB, no W/R/R, effort normal Abd: Soft,mild distension, nontender,fewBSheard TOI:ZTIWPY soft and nontender Psych: A&Ox3  Skin: no rashes noted, warm and dry  Lab Results:  Recent Labs    09/27/18 0810 09/29/18 0646  WBC 6.2 5.3  HGB 12.7* 12.1*  HCT 42.2 39.7  PLT 128* PENDING   BMET Recent Labs    09/28/18 0811 09/29/18 0646  NA 137 137  K 4.6 4.1  CL 103 105  CO2 20* 20*  GLUCOSE 103* 101*  BUN 86* 77*  CREATININE 2.06* 2.00*  CALCIUM 8.6* 8.3*   PT/INR No results for input(s): LABPROT, INR in the last 72 hours. CMP     Component Value Date/Time   NA 137 09/29/2018 0646   NA 143 09/02/2018 1533   K 4.1 09/29/2018 0646   CL 105 09/29/2018 0646   CO2 20 (L) 09/29/2018 0646   CO2 104 09/27/2015   GLUCOSE 101 (H) 09/29/2018 0646   BUN 77 (H) 09/29/2018 0646   BUN 79 (HH) 09/02/2018 1533   BUN 39 09/27/2015   CREATININE 2.00 (H) 09/29/2018 0646   CREATININE 1.96 (H) 10/23/2016 0920   CALCIUM 8.3 (L) 09/29/2018 0646   CALCIUM 8.8 09/27/2015   PROT 5.8 (L)  09/29/2018 0646   ALBUMIN 2.6 (L) 09/29/2018 0646   ALBUMIN 3.6 09/27/2015   AST 21 09/29/2018 0646   ALT 14 09/29/2018 0646   ALKPHOS 57 09/29/2018 0646   BILITOT 1.6 (H) 09/29/2018 0646   GFRNONAA 28 (L) 09/29/2018 0646   GFRNONAA 30 (L) 10/23/2016 0920   GFRAA 33 (L) 09/29/2018 0646   GFRAA 35 (L) 10/23/2016 0920   Lipase     Component Value Date/Time   LIPASE 35 08/31/2018 1131       Studies/Results: Dg Abd 2 Views  Result Date: 09/27/2018 CLINICAL DATA:  Small bowel obstruction follow-up. EXAM: ABDOMEN - 2 VIEW COMPARISON:  Multiple KUBs since September 24, 2018. FINDINGS: The distal tip of the NG tube terminates in the right medial lower chest, likely within the hiatal hernia. The side port is in the distal esophagus. Continued dilatation of small bowel loops consistent with known small bowel obstruction, similar in the interval. No free air or portal venous gas. Vascular calcifications noted. IMPRESSION: 1. Continued small bowel obstruction. 2. The NG tube likely terminates in the hiatal hernia, unchanged. Electronically Signed   By: Dorise Bullion III M.D   On: 09/27/2018 15:22   Dg Abd Acute W/chest  Result Date: 09/28/2018 CLINICAL DATA:  Abdominal pain, distention EXAM: DG ABDOMEN ACUTE W/ 1V CHEST COMPARISON:  09/27/2018 FINDINGS: The NG tube loops in  the dilated upper thoracic esophagus, stable. The tip is in the distal esophagus, also stable. Left base atelectasis. Heart is normal size. Continued small bowel dilatation with air-fluid levels compatible with small bowel obstruction. No visible free air. No significant change since prior study. IMPRESSION: Continued small bowel obstruction pattern. NG tube loops in the dilated thoracic esophagus with the tip in the distal esophagus. Left base atelectasis. Electronically Signed   By: Rolm Baptise M.D.   On: 09/28/2018 08:30   Dg Abd Portable 1v  Result Date: 09/29/2018 CLINICAL DATA:  Small bowel obstruction EXAM: PORTABLE  ABDOMEN - 1 VIEW COMPARISON:  09/28/2018 FINDINGS: Dilated small bowel loops are again noted throughout the abdomen and pelvis, not significantly changed since prior study compatible with small bowel obstruction. No visible evidence of free air on the supine view. No organomegaly. IMPRESSION: Continued small bowel obstruction pattern, stable. Electronically Signed   By: Rolm Baptise M.D.   On: 09/29/2018 08:07   [place] Ekg Site Rite  Result Date: 09/28/2018 If Site Rite image not attached, placement could not be confirmed due to current cardiac rhythm.   Anti-infectives: Anti-infectives (From admission, onward)   None       Assessment/Plan HTN DM HLD Hypothyroidism CKD-III H/o DVT Glaucoma Deaf   SBO - CT scan 9/26 showed distal small bowel obstruction likely due to adhesion; large hiatal hernia - f/u xrays did show contrast in colon, but today's xray still shows continued small bowel dilatation with air-fluid levels  ID -none FEN -IVF, NPO VTE -SCDs, lovenox Foley -none  Plan: Patient with persistent SBO findings on xray, but he did have 2 BMs yesterday, passing some flatus, and has had no n/v since NG tube came out. Still has a partial SBO. Will discuss with MD, may consider surgery today vs tomorrow. Will ask iv team to place tunneled central line for TPN access.   LOS: 5 days    Wellington Hampshire , [facility] Surgery 09/29/2018, 8:51 AM Pager: [phone] Mon 7:00 am -11:30 AM Tues-Fri 7:00 am-4:30 pm Sat-Sun 7:00 am-11:30 am

## 2018-09-29 NOTE — Progress Notes (Signed)
[facility] ROUNDING NOTE   Subjective:   2BMs and flatus yesterday, NG tube out.  Improving clinically so advanced to clears.   Objective:  Vital signs in last 24 hours:  Temp:  [97.4 F (36.3 C)-97.8 F (36.6 C)] 97.8 F (36.6 C) (10/01 0451) Pulse Rate:  [63-68] 65 (10/01 0451) Resp:  [16-18] 16 (10/01 0451) BP: (113-157)/(65-70) 132/70 (10/01 0451) SpO2:  [99 %-100 %] 100 % (10/01 0451) Weight:  [56.7 kg] 56.7 kg (09/30 1400)  Weight change:  Filed Weights   09/28/18 1400  Weight: 56.7 kg    Intake/Output: I/O last 3 completed shifts: In: 1667.2 [I.V.:1517.2; NG/GT:100; IV Piggyback:50] Out: 1250 [Urine:1150; Emesis/NG output:100]   Intake/Output this shift:  No intake/output data recorded.  CVS- RRR RS- CTA ABD-distended soft nontender EXT- no edema   Basic Metabolic Panel: Recent Labs  Lab 09/25/18 1010 09/26/18 0513 09/27/18 0810 09/28/18 0811 09/28/18 1454 09/29/18 0646  NA 141 139 137 137  --  137  K 5.3* 4.1 4.2 4.6  --  4.1  CL 100 101 102 103  --  105  CO2 30 25 21* 20*  --  20*  GLUCOSE 156* 120* 106* 103*  --  101*  BUN 83* 87* 92* 86*  --  77*  CREATININE 2.46* 2.36* 2.27* 2.06*  --  2.00*  CALCIUM 9.0 8.3* 8.2* 8.6*  --  8.3*  MG  --   --   --   --   --  1.8  PHOS  --   --   --   --  4.2 4.0    Liver Function Tests: Recent Labs  Lab 09/11/2018 1131 09/29/18 0646  AST 43* 21  ALT 22 14  ALKPHOS 62 57  BILITOT 0.9 1.6*  PROT 7.0 5.8*  ALBUMIN 3.7 2.6*   Recent Labs  Lab 09/28/2018 1131  LIPASE 35   No results for input(s): AMMONIA in the last 168 hours.  CBC: Recent Labs  Lab 09/21/2018 1131 09/25/18 1010 09/27/18 0810 09/29/18 0646  WBC 10.6* 7.8 6.2 5.3  NEUTROABS  --   --   --  4.1  HGB 13.5 13.3 12.7* 12.1*  HCT 45.0 44.7 42.2 39.7  MCV 90.7 90.7 90.0 90.0  PLT 159 164 128* 114*    Cardiac Enzymes: No results for input(s): CKTOTAL, CKMB, CKMBINDEX, TROPONINI in the last 168  hours.  BNP: Invalid input(s): POCBNP  CBG: Recent Labs  Lab 09/28/18 1635 09/28/18 1959 09/28/18 2311 09/29/18 0448 09/29/18 0749  GLUCAP 127* 119* 114* 99 90    Microbiology: Results for orders placed or performed during the hospital encounter of 08/25/18  MRSA PCR Screening     Status: None   Collection Time: 08/25/18  2:05 PM  Result Value Ref Range Status   MRSA by PCR NEGATIVE NEGATIVE Final    Comment:        The GeneXpert MRSA Assay (FDA approved for NASAL specimens only), is one component of a comprehensive MRSA colonization surveillance program. It is not intended to diagnose MRSA infection nor to guide or monitor treatment for MRSA infections. Performed at [facility], [place] [address], [place], [place] [address]     Coagulation Studies: No results for input(s): LABPROT, INR in the last 72 hours.  Urinalysis: No results for input(s): COLORURINE, LABSPEC, PHURINE, GLUCOSEU, HGBUR, BILIRUBINUR, KETONESUR, PROTEINUR, UROBILINOGEN, NITRITE, LEUKOCYTESUR in the last 72 hours.  Invalid input(s): APPERANCEUR    Imaging: Dg Abd 2 Views  Result Date: 09/27/2018 CLINICAL DATA:  Small bowel obstruction follow-up. EXAM: ABDOMEN - 2 VIEW COMPARISON:  Multiple KUBs since September 24, 2018. FINDINGS: The distal tip of the NG tube terminates in the right medial lower chest, likely within the hiatal hernia. The side port is in the distal esophagus. Continued dilatation of small bowel loops consistent with known small bowel obstruction, similar in the interval. No free air or portal venous gas. Vascular calcifications noted. IMPRESSION: 1. Continued small bowel obstruction. 2. The NG tube likely terminates in the hiatal hernia, unchanged. Electronically Signed   By: Dorise Bullion III M.D   On: 09/27/2018 15:22   Dg Abd Acute W/chest  Result Date: 09/28/2018 CLINICAL DATA:  Abdominal pain, distention EXAM: DG ABDOMEN ACUTE W/ 1V CHEST COMPARISON:  09/27/2018  FINDINGS: The NG tube loops in the dilated upper thoracic esophagus, stable. The tip is in the distal esophagus, also stable. Left base atelectasis. Heart is normal size. Continued small bowel dilatation with air-fluid levels compatible with small bowel obstruction. No visible free air. No significant change since prior study. IMPRESSION: Continued small bowel obstruction pattern. NG tube loops in the dilated thoracic esophagus with the tip in the distal esophagus. Left base atelectasis. Electronically Signed   By: Rolm Baptise M.D.   On: 09/28/2018 08:30   Dg Abd Portable 1v  Result Date: 09/29/2018 CLINICAL DATA:  Small bowel obstruction EXAM: PORTABLE ABDOMEN - 1 VIEW COMPARISON:  09/28/2018 FINDINGS: Dilated small bowel loops are again noted throughout the abdomen and pelvis, not significantly changed since prior study compatible with small bowel obstruction. No visible evidence of free air on the supine view. No organomegaly. IMPRESSION: Continued small bowel obstruction pattern, stable. Electronically Signed   By: Rolm Baptise M.D.   On: 09/29/2018 08:07   [place] Ekg Site Rite  Result Date: 09/28/2018 If Site Rite image not attached, placement could not be confirmed due to current cardiac rhythm.    Medications:   . sodium chloride 75 mL/hr at 09/29/18 0500  . famotidine (PEPCID) IV 20 mg (09/28/18 1256)  . methocarbamol (ROBAXIN) IV     . bisacodyl  10 mg Rectal Daily  . brimonidine  1 drop Both Eyes Q12H   And  . timolol  1 drop Both Eyes Q12H  . feeding supplement  1 Container Oral TID BM  . insulin aspart  0-9 Units Subcutaneous Q4H  . latanoprost  1 drop Both Eyes QHS  . levothyroxine  37.5 mcg Intravenous Daily  . lip balm  1 application Topical BID  . magic mouthwash  15 mL Oral QID   alum & mag hydroxide-simeth, guaiFENesin-dextromethorphan, hydrocortisone, hydrocortisone cream, menthol-cetylpyridinium, methocarbamol (ROBAXIN) IV, phenol  Assessment/ Plan:   Chronic renal  disease stage IV baseline 2-2.2.  Currently stable at baseline. On 1/2NS 27mL/hr - continue but watch serum sodium which is currently ok at 137.  He had recent mild hyponatremia.   CT abdomen do not reveal any hydronephrosis not taking any nephrotoxic medications no ACE inhibitors no ARB's.    Hypertension blood pressure appears to be adequately controlled  Anemia not an issue at this time  Bone stable continue to follow calcium phosphorus  Hypothyroidism on replacement  Small bowel obstruction improving with conservative mgmt  Deafness use sign language interpreter   LOS: 5 Jannifer Hick A @TODAY @10 :58 AM

## 2018-09-29 DEATH — deceased

## 2018-09-30 ENCOUNTER — Inpatient Hospital Stay (HOSPITAL_COMMUNITY): Payer: Medicare Other | Admitting: Anesthesiology

## 2018-09-30 ENCOUNTER — Encounter (HOSPITAL_COMMUNITY): Payer: Self-pay | Admitting: Orthopedic Surgery

## 2018-09-30 ENCOUNTER — Encounter (HOSPITAL_COMMUNITY): Admission: EM | Disposition: E | Payer: Self-pay | Source: Home / Self Care | Attending: Family Medicine

## 2018-09-30 ENCOUNTER — Inpatient Hospital Stay (HOSPITAL_COMMUNITY): Payer: Medicare Other

## 2018-09-30 DIAGNOSIS — E440 Moderate protein-calorie malnutrition: Secondary | ICD-10-CM

## 2018-09-30 HISTORY — PX: LYSIS OF ADHESION: SHX5961

## 2018-09-30 HISTORY — PX: LAPAROTOMY: SHX154

## 2018-09-30 LAB — CBC
HCT: 39.7 % (ref 39.0–52.0)
HEMOGLOBIN: 12.3 g/dL — AB (ref 13.0–17.0)
MCH: 27.4 pg (ref 26.0–34.0)
MCHC: 31 g/dL (ref 30.0–36.0)
MCV: 88.4 fL (ref 78.0–100.0)
PLATELETS: 129 10*3/uL — AB (ref 150–400)
RBC: 4.49 MIL/uL (ref 4.22–5.81)
RDW: 16.2 % — ABNORMAL HIGH (ref 11.5–15.5)
WBC: 4.9 10*3/uL (ref 4.0–10.5)

## 2018-09-30 LAB — POCT I-STAT 3, ART BLOOD GAS (G3+)
ACID-BASE DEFICIT: 13 mmol/L — AB (ref 0.0–2.0)
Acid-base deficit: 9 mmol/L — ABNORMAL HIGH (ref 0.0–2.0)
Bicarbonate: 13.7 mmol/L — ABNORMAL LOW (ref 20.0–28.0)
Bicarbonate: 16 mmol/L — ABNORMAL LOW (ref 20.0–28.0)
O2 Saturation: 100 %
O2 Saturation: 100 %
PCO2 ART: 29.8 mmHg — AB (ref 32.0–48.0)
PH ART: 7.366 (ref 7.350–7.450)
PO2 ART: 435 mmHg — AB (ref 83.0–108.0)
Patient temperature: 94.5
TCO2: 15 mmol/L — ABNORMAL LOW (ref 22–32)
TCO2: 17 mmol/L — AB (ref 22–32)
pCO2 arterial: 27.9 mmHg — ABNORMAL LOW (ref 32.0–48.0)
pH, Arterial: 7.258 — ABNORMAL LOW (ref 7.350–7.450)
pO2, Arterial: 406 mmHg — ABNORMAL HIGH (ref 83.0–108.0)

## 2018-09-30 LAB — BASIC METABOLIC PANEL
Anion gap: 13 (ref 5–15)
BUN: 65 mg/dL — ABNORMAL HIGH (ref 8–23)
CALCIUM: 8.3 mg/dL — AB (ref 8.9–10.3)
CO2: 17 mmol/L — AB (ref 22–32)
CREATININE: 1.67 mg/dL — AB (ref 0.61–1.24)
Chloride: 106 mmol/L (ref 98–111)
GFR calc non Af Amer: 35 mL/min — ABNORMAL LOW (ref >60)
GFR, EST AFRICAN AMERICAN: 41 mL/min — AB (ref >60)
Glucose, Bld: 94 mg/dL (ref 70–99)
Potassium: 5.1 mmol/L (ref 3.5–5.1)
SODIUM: 136 mmol/L (ref 135–145)

## 2018-09-30 LAB — TYPE AND SCREEN
ABO/RH(D): A POS
ANTIBODY SCREEN: NEGATIVE

## 2018-09-30 LAB — ABO/RH: ABO/RH(D): A POS

## 2018-09-30 LAB — POCT I-STAT, CHEM 8
BUN: 49 mg/dL — ABNORMAL HIGH (ref 8–23)
Calcium, Ion: 1.06 mmol/L — ABNORMAL LOW (ref 1.15–1.40)
Chloride: 113 mmol/L — ABNORMAL HIGH (ref 98–111)
Creatinine, Ser: 1.4 mg/dL — ABNORMAL HIGH (ref 0.61–1.24)
Glucose, Bld: 185 mg/dL — ABNORMAL HIGH (ref 70–99)
HEMATOCRIT: 30 % — AB (ref 39.0–52.0)
HEMOGLOBIN: 10.2 g/dL — AB (ref 13.0–17.0)
Potassium: 3.7 mmol/L (ref 3.5–5.1)
SODIUM: 142 mmol/L (ref 135–145)
TCO2: 17 mmol/L — AB (ref 22–32)

## 2018-09-30 LAB — SURGICAL PCR SCREEN
MRSA, PCR: NEGATIVE
STAPHYLOCOCCUS AUREUS: NEGATIVE

## 2018-09-30 LAB — GLUCOSE, CAPILLARY
GLUCOSE-CAPILLARY: 157 mg/dL — AB (ref 70–99)
GLUCOSE-CAPILLARY: 87 mg/dL (ref 70–99)
GLUCOSE-CAPILLARY: 95 mg/dL (ref 70–99)
GLUCOSE-CAPILLARY: 96 mg/dL (ref 70–99)
Glucose-Capillary: 111 mg/dL — ABNORMAL HIGH (ref 70–99)
Glucose-Capillary: 126 mg/dL — ABNORMAL HIGH (ref 70–99)

## 2018-09-30 LAB — TROPONIN I: Troponin I: 0.1 ng/mL (ref <0.03)

## 2018-09-30 LAB — CORTISOL: CORTISOL PLASMA: 28.1 ug/dL

## 2018-09-30 SURGERY — LAPAROTOMY, EXPLORATORY
Anesthesia: General | Site: Abdomen

## 2018-09-30 MED ORDER — DEXMEDETOMIDINE HCL IN NACL 200 MCG/50ML IV SOLN
INTRAVENOUS | Status: AC
  Filled 2018-09-30: qty 50

## 2018-09-30 MED ORDER — DARBEPOETIN ALFA 60 MCG/0.3ML IJ SOSY: 60.0000 ug | PREFILLED_SYRINGE | Freq: Once | INTRAMUSCULAR | Status: DC

## 2018-09-30 MED ORDER — SODIUM CHLORIDE 0.9 % IV SOLN: INTRAVENOUS | Status: DC | PRN

## 2018-09-30 MED ORDER — ROCURONIUM BROMIDE 50 MG/5ML IV SOSY
PREFILLED_SYRINGE | INTRAVENOUS | Status: AC
  Filled 2018-09-30: qty 5

## 2018-09-30 MED ORDER — PHENYLEPHRINE 40 MCG/ML (10ML) SYRINGE FOR IV PUSH (FOR BLOOD PRESSURE SUPPORT)
PREFILLED_SYRINGE | INTRAVENOUS | Status: AC
  Filled 2018-09-30: qty 10

## 2018-09-30 MED ORDER — LACTATED RINGERS IV SOLN
INTRAVENOUS | Status: DC | PRN
  Administered 2018-09-30: 14:00:00 via INTRAVENOUS

## 2018-09-30 MED ORDER — BUPIVACAINE LIPOSOME 1.3 % IJ SUSP
20.0000 mL | INTRAMUSCULAR | Status: AC
  Administered 2018-09-30: 20 mL
  Filled 2018-09-30: qty 20

## 2018-09-30 MED ORDER — VASOPRESSIN 20 UNIT/ML IV SOLN
INTRAVENOUS | Status: AC
  Filled 2018-09-30: qty 1

## 2018-09-30 MED ORDER — SODIUM CHLORIDE 0.9 % IV BOLUS
500.0000 mL | Freq: Once | INTRAVENOUS | Status: AC
  Administered 2018-09-30: 500 mL via INTRAVENOUS

## 2018-09-30 MED ORDER — ROCURONIUM BROMIDE 50 MG/5ML IV SOSY
PREFILLED_SYRINGE | INTRAVENOUS | Status: AC
  Filled 2018-09-30: qty 15

## 2018-09-30 MED ORDER — PHENYLEPHRINE 40 MCG/ML (10ML) SYRINGE FOR IV PUSH (FOR BLOOD PRESSURE SUPPORT)
PREFILLED_SYRINGE | INTRAVENOUS | Status: DC | PRN
  Administered 2018-09-30 (×4): 80 ug via INTRAVENOUS
  Administered 2018-09-30 (×2): 120 ug via INTRAVENOUS
  Administered 2018-09-30: 40 ug via INTRAVENOUS
  Administered 2018-09-30: 120 ug via INTRAVENOUS
  Administered 2018-09-30: 80 ug via INTRAVENOUS
  Administered 2018-09-30 (×2): 120 ug via INTRAVENOUS

## 2018-09-30 MED ORDER — SODIUM BICARBONATE 8.4 % IV SOLN
100.0000 meq | Freq: Once | INTRAVENOUS | Status: AC
  Administered 2018-09-30: 100 meq via INTRAVENOUS
  Filled 2018-09-30: qty 50

## 2018-09-30 MED ORDER — NOREPINEPHRINE BITARTRATE 1 MG/ML IV SOLN
INTRAVENOUS | Status: DC | PRN
  Administered 2018-09-30: 2 ug/min via INTRAVENOUS

## 2018-09-30 MED ORDER — LIDOCAINE 2% (20 MG/ML) 5 ML SYRINGE
INTRAMUSCULAR | Status: DC | PRN
  Administered 2018-09-30: 40 mg via INTRAVENOUS

## 2018-09-30 MED ORDER — SODIUM BICARBONATE 8.4 % IV SOLN: 100.0000 meq | Freq: Once | INTRAVENOUS | Status: DC

## 2018-09-30 MED ORDER — SODIUM CHLORIDE 0.9 % IV SOLN
INTRAVENOUS | Status: DC | PRN
  Administered 2018-09-30: 40 ug/min via INTRAVENOUS

## 2018-09-30 MED ORDER — SODIUM CHLORIDE 0.9 % IV SOLN
1.0000 g | Freq: Once | INTRAVENOUS | Status: AC
  Administered 2018-09-30: 1 g via INTRAVENOUS
  Filled 2018-09-30: qty 10

## 2018-09-30 MED ORDER — SODIUM CHLORIDE 0.9 % IV SOLN
1.0000 g | INTRAVENOUS | Status: DC
  Filled 2018-09-30: qty 10

## 2018-09-30 MED ORDER — DEXAMETHASONE SODIUM PHOSPHATE 10 MG/ML IJ SOLN
INTRAMUSCULAR | Status: DC | PRN
  Administered 2018-09-30: 5 mg via INTRAVENOUS

## 2018-09-30 MED ORDER — SUCCINYLCHOLINE CHLORIDE 200 MG/10ML IV SOSY
PREFILLED_SYRINGE | INTRAVENOUS | Status: AC
  Filled 2018-09-30: qty 10

## 2018-09-30 MED ORDER — TRANEXAMIC ACID 1000 MG/10ML IV SOLN
1000.0000 mg | Freq: Once | INTRAVENOUS | Status: AC
  Administered 2018-09-30: 1000 mg via INTRAVENOUS
  Filled 2018-09-30: qty 10

## 2018-09-30 MED ORDER — SODIUM CHLORIDE 0.9 % IV SOLN
INTRAVENOUS | Status: DC | PRN
  Administered 2018-09-30 (×2): via INTRAVENOUS

## 2018-09-30 MED ORDER — STERILE WATER FOR INJECTION IV SOLN
INTRAVENOUS | Status: DC
  Administered 2018-09-30: 19:00:00 via INTRAVENOUS
  Filled 2018-09-30: qty 850

## 2018-09-30 MED ORDER — TRAVASOL 10 % IV SOLN
INTRAVENOUS | Status: DC
  Filled 2018-09-30: qty 428.4

## 2018-09-30 MED ORDER — PROPOFOL 10 MG/ML IV BOLUS
INTRAVENOUS | Status: DC | PRN
  Administered 2018-09-30: 120 mg via INTRAVENOUS

## 2018-09-30 MED ORDER — VASOPRESSIN 20 UNIT/ML IV SOLN
0.0300 [IU]/min | INTRAVENOUS | Status: DC
  Administered 2018-09-30: 0.03 [IU]/min via INTRAVENOUS
  Filled 2018-09-30: qty 2

## 2018-09-30 MED ORDER — LACTATED RINGERS IV SOLN: INTRAVENOUS | Status: DC

## 2018-09-30 MED ORDER — DEXAMETHASONE SODIUM PHOSPHATE 10 MG/ML IJ SOLN
INTRAMUSCULAR | Status: AC
  Filled 2018-09-30: qty 1

## 2018-09-30 MED ORDER — SODIUM CHLORIDE 0.9 % IV SOLN
2.0000 g | INTRAVENOUS | Status: AC
  Administered 2018-09-30: 2 g via INTRAVENOUS
  Filled 2018-09-30 (×2): qty 20

## 2018-09-30 MED ORDER — FENTANYL CITRATE (PF) 100 MCG/2ML IJ SOLN: 25.0000 ug | INTRAMUSCULAR | Status: DC | PRN

## 2018-09-30 MED ORDER — NOREPINEPHRINE 16 MG/250ML-% IV SOLN
0.0000 ug/min | INTRAVENOUS | Status: DC
  Administered 2018-09-30: 35 ug/min via INTRAVENOUS
  Filled 2018-09-30: qty 250

## 2018-09-30 MED ORDER — 0.9 % SODIUM CHLORIDE (POUR BTL) OPTIME
TOPICAL | Status: DC | PRN
  Administered 2018-09-30 (×2): 1000 mL
  Administered 2018-09-30: 5000 mL

## 2018-09-30 MED ORDER — HYDROCORTISONE NA SUCCINATE PF 100 MG IJ SOLR
50.0000 mg | Freq: Four times a day (QID) | INTRAMUSCULAR | Status: DC
  Administered 2018-09-30: 50 mg via INTRAVENOUS
  Filled 2018-09-30: qty 2

## 2018-09-30 MED ORDER — NOREPINEPHRINE BITARTRATE 1 MG/ML IV SOLN: INTRAVENOUS | Status: DC | PRN

## 2018-09-30 MED ORDER — NOREPINEPHRINE 4 MG/250ML-% IV SOLN
0.0000 ug/min | INTRAVENOUS | Status: DC
  Filled 2018-09-30: qty 250

## 2018-09-30 MED ORDER — EPHEDRINE SULFATE-NACL 50-0.9 MG/10ML-% IV SOSY
PREFILLED_SYRINGE | INTRAVENOUS | Status: DC | PRN
  Administered 2018-09-30 (×2): 10 mg via INTRAVENOUS
  Administered 2018-09-30: 15 mg via INTRAVENOUS
  Administered 2018-09-30 (×2): 10 mg via INTRAVENOUS
  Administered 2018-09-30 (×2): 5 mg via INTRAVENOUS

## 2018-09-30 MED ORDER — VASOPRESSIN 20 UNIT/ML IV SOLN
INTRAVENOUS | Status: DC | PRN
  Administered 2018-09-30: 1 [IU] via INTRAVENOUS
  Administered 2018-09-30: 2 [IU] via INTRAVENOUS
  Administered 2018-09-30: 1 [IU] via INTRAVENOUS

## 2018-09-30 MED ORDER — FENTANYL CITRATE (PF) 250 MCG/5ML IJ SOLN
INTRAMUSCULAR | Status: DC | PRN
  Administered 2018-09-30: 25 ug via INTRAVENOUS
  Administered 2018-09-30: 75 ug via INTRAVENOUS
  Administered 2018-09-30 (×2): 25 ug via INTRAVENOUS

## 2018-09-30 MED ORDER — MIDAZOLAM HCL 2 MG/2ML IJ SOLN: 1.0000 mg | INTRAMUSCULAR | Status: DC | PRN

## 2018-09-30 MED ORDER — NOREPINEPHRINE 16 MG/250ML-% IV SOLN
0.0000 ug/min | INTRAVENOUS | Status: DC
  Filled 2018-09-30 (×2): qty 250

## 2018-09-30 MED ORDER — EPHEDRINE 5 MG/ML INJ
INTRAVENOUS | Status: AC
  Filled 2018-09-30: qty 10

## 2018-09-30 MED ORDER — PHENYLEPHRINE HCL-NACL 40-0.9 MG/250ML-% IV SOLN
0.0000 ug/min | INTRAVENOUS | Status: DC
  Administered 2018-09-30: 20 ug/min via INTRAVENOUS
  Filled 2018-09-30: qty 250

## 2018-09-30 MED ORDER — SODIUM CHLORIDE 0.45 % IV SOLN: INTRAVENOUS | Status: DC

## 2018-09-30 MED ORDER — FENTANYL CITRATE (PF) 250 MCG/5ML IJ SOLN
INTRAMUSCULAR | Status: AC
  Filled 2018-09-30: qty 5

## 2018-09-30 MED ORDER — PHENYLEPHRINE HCL-NACL 10-0.9 MG/250ML-% IV SOLN
0.0000 ug/min | INTRAVENOUS | Status: DC
  Administered 2018-09-30: 400 ug/min via INTRAVENOUS
  Filled 2018-09-30: qty 250

## 2018-09-30 MED ORDER — SODIUM CHLORIDE 0.9 % IV SOLN
INTRAVENOUS | Status: DC
  Administered 2018-09-30 (×2): via INTRAVENOUS

## 2018-09-30 MED ORDER — DEXMEDETOMIDINE HCL IN NACL 200 MCG/50ML IV SOLN
INTRAVENOUS | Status: DC | PRN
  Administered 2018-09-30: .5 ug/kg/h via INTRAVENOUS

## 2018-09-30 MED ORDER — FAMOTIDINE IN NACL 20-0.9 MG/50ML-% IV SOLN
20.0000 mg | INTRAVENOUS | Status: DC
  Administered 2018-09-30: 20 mg via INTRAVENOUS
  Filled 2018-09-30: qty 50

## 2018-09-30 MED ORDER — ONDANSETRON HCL 4 MG/2ML IJ SOLN
INTRAMUSCULAR | Status: AC
  Filled 2018-09-30: qty 2

## 2018-09-30 MED ORDER — ROCURONIUM BROMIDE 10 MG/ML (PF) SYRINGE
PREFILLED_SYRINGE | INTRAVENOUS | Status: DC | PRN
  Administered 2018-09-30: 80 mg via INTRAVENOUS
  Administered 2018-09-30 (×2): 10 mg via INTRAVENOUS

## 2018-09-30 MED ORDER — LIDOCAINE 2% (20 MG/ML) 5 ML SYRINGE
INTRAMUSCULAR | Status: AC
  Filled 2018-09-30: qty 5

## 2018-09-30 SURGICAL SUPPLY — 45 items
BLADE CLIPPER SURG (BLADE) IMPLANT
CANISTER SUCT 3000ML PPV (MISCELLANEOUS) ×3 IMPLANT
CHLORAPREP W/TINT 26ML (MISCELLANEOUS) ×3 IMPLANT
COVER SURGICAL LIGHT HANDLE (MISCELLANEOUS) ×3 IMPLANT
COVER WAND RF STERILE (DRAPES) ×3 IMPLANT
DRAIN CHANNEL 19F RND (DRAIN) ×3 IMPLANT
DRAPE LAPAROSCOPIC ABDOMINAL (DRAPES) ×3 IMPLANT
DRAPE WARM FLUID 44X44 (DRAPE) ×3 IMPLANT
DRSG OPSITE POSTOP 4X10 (GAUZE/BANDAGES/DRESSINGS) IMPLANT
DRSG OPSITE POSTOP 4X8 (GAUZE/BANDAGES/DRESSINGS) IMPLANT
DRSG VAC ATS LRG SENSATRAC (GAUZE/BANDAGES/DRESSINGS) ×3 IMPLANT
ELECT BLADE 6.5 EXT (BLADE) ×3 IMPLANT
ELECT CAUTERY BLADE 6.4 (BLADE) ×3 IMPLANT
ELECT REM PT RETURN 9FT ADLT (ELECTROSURGICAL) ×3
ELECTRODE REM PT RTRN 9FT ADLT (ELECTROSURGICAL) ×1 IMPLANT
EVACUATOR SILICONE 100CC (DRAIN) ×3 IMPLANT
GLOVE BIO SURGEON STRL SZ7 (GLOVE) ×3 IMPLANT
GLOVE BIOGEL PI IND STRL 7.5 (GLOVE) ×1 IMPLANT
GLOVE BIOGEL PI INDICATOR 7.5 (GLOVE) ×2
GLOVE SURG SS PI 7.0 STRL IVOR (GLOVE) ×3 IMPLANT
GOWN STRL REUS W/ TWL LRG LVL3 (GOWN DISPOSABLE) ×3 IMPLANT
GOWN STRL REUS W/TWL LRG LVL3 (GOWN DISPOSABLE) ×6
KIT BASIN OR (CUSTOM PROCEDURE TRAY) ×3 IMPLANT
KIT TURNOVER KIT B (KITS) ×3 IMPLANT
LIGASURE IMPACT 36 18CM CVD LR (INSTRUMENTS) ×3 IMPLANT
NS IRRIG 1000ML POUR BTL (IV SOLUTION) ×6 IMPLANT
PACK GENERAL/GYN (CUSTOM PROCEDURE TRAY) ×3 IMPLANT
PAD ARMBOARD 7.5X6 YLW CONV (MISCELLANEOUS) ×3 IMPLANT
RELOAD PROXIMATE 75MM BLUE (ENDOMECHANICALS) ×18 IMPLANT
RELOAD PROXIMATE TA60MM BLUE (ENDOMECHANICALS) ×9 IMPLANT
SPECIMEN JAR LARGE (MISCELLANEOUS) ×3 IMPLANT
SPONGE LAP 18X18 X RAY DECT (DISPOSABLE) ×6 IMPLANT
STAPLER GUN LINEAR PROX 60 (STAPLE) ×3 IMPLANT
STAPLER PROXIMATE 75MM BLUE (STAPLE) ×3 IMPLANT
STAPLER VISISTAT 35W (STAPLE) ×3 IMPLANT
SUCTION POOLE TIP (SUCTIONS) ×3 IMPLANT
SUT PDS AB 1 TP1 96 (SUTURE) ×6 IMPLANT
SUT SILK 2 0 SH CR/8 (SUTURE) ×6 IMPLANT
SUT SILK 2 0 TIES 10X30 (SUTURE) ×3 IMPLANT
SUT SILK 3 0 SH CR/8 (SUTURE) ×3 IMPLANT
SUT SILK 3 0 TIES 10X30 (SUTURE) ×3 IMPLANT
SUT VIC AB 3-0 SH 18 (SUTURE) IMPLANT
TOWEL OR 17X26 10 PK STRL BLUE (TOWEL DISPOSABLE) ×3 IMPLANT
TRAY FOLEY MTR SLVR 16FR STAT (SET/KITS/TRAYS/PACK) ×3 IMPLANT
YANKAUER SUCT BULB TIP NO VENT (SUCTIONS) ×6 IMPLANT

## 2018-10-01 ENCOUNTER — Encounter (HOSPITAL_COMMUNITY): Payer: Self-pay | Admitting: Surgery

## 2018-10-07 ENCOUNTER — Ambulatory Visit: Payer: Medicare Other | Admitting: Family Medicine

## 2018-10-30 NOTE — Progress Notes (Signed)
This nurse went to hang TNA for patient. RN requested that VAST return latter to hang TNA. Pt recently came up from OR. Fran Lowes, RN VAST

## 2018-10-30 NOTE — Op Note (Signed)
Preop diagnosis: Persistent small bowel obstruction Postop diagnosis: Closed loop small bowel obstruction Procedure performed: Exploratory laparotomy, extensive lysis of adhesions (greater than 120 minutes), small bowel resection with primary anastomosis, revision of gastrojejunal anastomosis, placement of wound VAC (25 cm) Surgeon:Cass Vandermeulen K Hamdi Vari Assistant: Dr. Chucky May Anesthesia: General Indications: This is an 84 year old male with multiple medical issues who presents with previous abdominal surgery of remote history.  The patient does not know what his previous abdominal surgery entailed.  He presented with signs and symptoms of a small bowel obstruction.  He was managed conservatively for several days.  There is no progression and his abdomen has become progressively more distended.  After discussion with the patient and his family, we made the decision to proceed to the operating room for exploratory laparotomy.  The patient is Jehovah's Witness and refuses blood products as well as albumin infusions.  Description of procedure: The patient is brought to the operating room and placed in supine position on the operating room table.  After an adequate level of general anesthesia was obtained, a Foley catheter was placed under sterile technique.  A central line was placed by anesthesia.  The patient's abdomen was prepped with ChloraPrep and draped in sterile fashion.  A timeout was taken to ensure the proper patient and proper procedure.  The patient's abdomen is quite distended.  We made a vertical midline incision using most of his previous scar.  We dissected down through the subcutaneous tissues to the fascia.  We began carefully dissecting through the fascia.  We encountered stainless steel wire closure.  We remove these wires.  We carefully dissected into the peritoneal cavity.  The patient has a lot of omental adhesions to the anterior abdominal wall.  We began taking down these adhesions.  The  small bowel was very dilated.  There is a segment of small bowel in the left lower quadrant that is quite dilated.  As we began examining this loop there was an area that ruptured with spillage of succus entericus.  We quickly temporarily closed the enterotomy with 3-0 silk sutures.  We then spent a considerable amount of time mobilizing the small bowel.  There are adhesions that are quite dense in all 4 quadrants of the abdomen.  The colon and small bowel are also densely adherent.  My suspicion based on the fact that the patient has stainless steel wire in his fascia is that the surgery was performed many years ago and they likely used powdered globes which results in more dense adhesions.  We mobilized the small bowel out of the left upper quadrant.  We found an area of very tight stricture.  This is fairly close to the previous enterotomy.  As we continued mobilizing the small bowel proximally we encountered what appeared to be an end-to-side anastomosis of the small bowel.  We followed the small bowel more proximally.  This area is very tightly adherent to the posterior surface of the xiphoid process.  As we were trying to take these adhesions down, an enterotomy was made in the anterior surface of what appeared to be a gastrojejunal anastomosis.  We mobilized the stomach away from the anterior abdominal wall.  The nasogastric tube is seen within the stomach.  The transverse colon is also densely adherent in this hard area of scar tissue.  A small colotomy was identified and was repaired with a TA 60 stapler.  The lumen of the transverse colon is fairly wide.  There is no narrowing  in this area.  Once we had fully mobilized the small bowel and had delineated his anatomy it appears that the patient had a Roux-en-Y gastrojejunal anastomosis.  This was probably performed for ulcer disease but those records are obviously not available.  He appears to have had an antrectomy.  We performed a small bowel resection  to resect the segment of small bowel including the enterotomy as well as the area of stricture.  We created a side-to-side anastomosis with a GIA-75 stapler and a TX 60 stapler.  The mesenteric defect was closed with 2-0 silk.  The crotch of the anastomosis was reinforced with 3-0 silk.  The anastomosis is widely patent.  We then took down the Brandon anastomosis.  We resected the end of the small intestine.  We resected the edges of the stomach and closed this with a TX 60 stapler.  We created a new stapled anastomosis using a GIA-75 stapler and closed the enterotomy with a TX 60 stapler.  The staple line was oversewn with 3-0 silk sutures to bury the staple line.  We irrigated the abdomen thoroughly and inspected for hemostasis.  The patient is critically ill and is requiring levophed for his blood pressure.  We placed a drain through a stab incision in the right side and placed this adjacent to the new GJ anastomosis.  We closed the fascia with double-stranded #1 PDS suture.  The drain was placed to suction.  A VAC sponge was cut to fit the wound and was sealed with an occlusive drape.  This was placed to 125 mmHg suction.  The patient was then transported to the intensive care unit in critical condition.  All sponge, instrument, and needle counts are correct.  Imogene Burn. Georgette Dover, MD, [facility]  General/ Trauma Surgery Beeper [phone]  2018-10-03 5:36 PM

## 2018-10-30 NOTE — Anesthesia Preprocedure Evaluation (Signed)
Anesthesia Evaluation  Patient identified by MRN, date of birth, ID band Patient awake    Reviewed: Allergy & Precautions, NPO status , Patient's Chart, lab work & pertinent test results  Airway Mallampati: III  TM Distance: >3 FB Neck ROM: Full    Dental no notable dental hx.    Pulmonary former smoker,    Pulmonary exam normal        Cardiovascular hypertension, Normal cardiovascular exam     Neuro/Psych negative neurological ROS  negative psych ROS   GI/Hepatic PUD, Small bowel obstruction   Endo/Other  diabetesHypothyroidism   Renal/GU Renal InsufficiencyRenal disease     Musculoskeletal  (+) Arthritis , Osteoarthritis,    Abdominal   Peds  Hematology  (+) anemia , REFUSES BLOOD PRODUCTS, Hx of DVT, portal vein thrombosis   Anesthesia Other Findings   Reproductive/Obstetrics                            Anesthesia Physical Anesthesia Plan  ASA: III  Anesthesia Plan: General   Post-op Pain Management:    Induction: Intravenous, Rapid sequence and Cricoid pressure planned  PONV Risk Score and Plan: 2 and Ondansetron, Dexamethasone and Treatment may vary due to age or medical condition  Airway Management Planned: Oral ETT  Additional Equipment:   Intra-op Plan:   Post-operative Plan: Extubation in OR  Informed Consent: I have reviewed the patients History and Physical, chart, labs and discussed the procedure including the risks, benefits and alternatives for the proposed anesthesia with the patient or authorized representative who has indicated his/her understanding and acceptance.     Plan Discussed with:   Anesthesia Plan Comments:         Anesthesia Quick Evaluation

## 2018-10-30 NOTE — Progress Notes (Signed)
   2018/10/03 1900  Clinical Encounter Type  Visited With Family;Patient not available  Visit Type Initial;Psychological support;Social support;Critical Care  Referral From Nurse  Spiritual Encounters  Spiritual Needs Emotional  [place] paged to offer emotional support for family at bedside of patient; [place] offered support and informed family that [facility] is available as required for additional support. Gwynn Burly

## 2018-10-30 NOTE — Death Summary Note (Signed)
Philip Matthews  DEATH SUMMARY   Patient Details  Name: Philip Matthews MRN: [id number] DOB: 1928-05-01  Admission/Discharge Information   Admit Date:  October 17, 2018  Date of Death:  Oct 23, 2018  Time of Death:  18-Apr-2107  Length of Stay: 03-28-2023  Referring Physician: Rory Percy, DO   Reason(s) for Hospitalization  small bowel obstruction  Diagnoses  Preliminary cause of death:  Secondary Diagnoses (including complications and co-morbidities):  Principal Problem:   SBO (small bowel obstruction) ([place]) Active Problems:   Type II diabetes mellitus, controlled but with peripheral neuropathy   Anemia in chronic kidney disease   Chronic kidney disease (CKD), stage IV (severe) (H[facility])   Bilateral deafness   Generalized abdominal pain   Moderate malnutrition ([place])   Brief Hospital Course (including significant findings, care, treatment, and services provided and events leading to death)  Philip Matthews is a 84 y.o. year old male with PMHx of DVT, HTN, HLD, Diverticulosis, PUD, Portal vein thrombosis, Liver nodule, Kidney mass, Type II DM, Hypothyroidism, CKD Stage IV who presented on October 17, 2018 with constipation and distension. Found to have a SBO with dilation. On 2018/10/23 Surgery noted there was no progression in resolution with conservative medical mgmt and pt was planned for exploratory laparotomy with lysis of adhesions. On 10/23/18 POD 0 Ex lap with extensive LOA, small bowel resection with primary anastomosis and revision of gastrojejunal anastomosis, placement of wound vac.  Returned to ICU in shock presumed to be septic but other etiologies not ruled out.   Pt is Jehovah's witness and declined blood products. Risks and benefit were explained to him prior to procedure and he understood and wished to proceed.  Pt received TXA - received at 1800 Despite Levophed ggt @ 55 Neo ggt @ 200 max doses, Bicarb ggt @ 75cc/hr, TXA Vaso @0 .03  I explained that despite our current clinical interventions he is continuing to  decline and will most likely have a cardiac arrest. When I asked if he was a full code and explained that that included compressions and ACLS medications. The daughter expressed that they did not want him to receive compressions. His resuscitation status was converted to DNR.   I asked if they wanted to continue the current interventions understanding that his prognosis is poor. They expressed a desire to make him comfortable. I expressed what is entailed in withdrawal and comfort care and they understood and wanted to proceed. However when I walked in to the room pt had no pulse at 20:36.   I stated to the family that he has already lost his pulse. At that time all medications were discontinued.at 20:36 And the endotracheal tube was removed by RT at 20:40 He continued to have PEA on the monitor. Pronounced when electrical activity ceased.  Time of Death: 18-Apr-2107 Pertinent Labs and Studies  Significant Diagnostic Studies Ct Abdomen Pelvis Wo Contrast  Result Date: 10/17/18 CLINICAL DATA:  Constipation for the past 3 days. EXAM: CT ABDOMEN AND PELVIS WITHOUT CONTRAST TECHNIQUE: Multidetector CT imaging of the abdomen and pelvis was performed following the standard protocol without IV contrast. COMPARISON:  Abdomen pelvis radiographs obtained earlier today. Abdomen ultrasound dated 04/01/2011. FINDINGS: Lower chest: Large hiatal hernia. Small bilateral pleural effusions and associated mild bibasilar atelectasis. Atheromatous coronary artery calcifications. Hepatobiliary: No focal liver abnormality is seen. No gallstones, gallbladder wall thickening, or biliary dilatation. Pancreas: Unremarkable. No pancreatic ductal dilatation or surrounding inflammatory changes. Spleen: Normal in size without focal abnormality. Adrenals/Urinary Tract: Both kidneys are small. Normal appearing adrenal  glands, urinary bladder and ureters. Stomach/Bowel: Normal caliber colon containing multiple diverticula. Diffusely  dilated small bowel and stomach. Vascular/Lymphatic: Right upper quadrant vascular coils. Extensive atheromatous arterial calcifications. No enlarged lymph nodes. Reproductive: Mildly enlarged prostate gland. Other: Anterior abdominal wall surgical sutures. Small amount of free peritoneal fluid. Small left inguinal hernia containing fat. Musculoskeletal: Diffuse osteopenia. Lumbar and lower thoracic spine degenerative changes. IMPRESSION: 1. Distal small bowel obstruction. The cause of obstruction is not identified, most likely due to an adhesion. 2. Large hiatal hernia. 3. Small bilateral pleural effusions and associated mild bibasilar atelectasis. 4. Extensive atheromatous arterial calcifications, including the coronary arteries. 5. Mild prostatic hypertrophy. 6. Scattered colonic diverticula. Electronically Signed   By: Claudie Revering M.D.   On: 09/21/2018 17:08   Dg Abd 2 Views  Result Date: 09/27/2018 CLINICAL DATA:  Small bowel obstruction follow-up. EXAM: ABDOMEN - 2 VIEW COMPARISON:  Multiple KUBs since September 24, 2018. FINDINGS: The distal tip of the NG tube terminates in the right medial lower chest, likely within the hiatal hernia. The side port is in the distal esophagus. Continued dilatation of small bowel loops consistent with known small bowel obstruction, similar in the interval. No free air or portal venous gas. Vascular calcifications noted. IMPRESSION: 1. Continued small bowel obstruction. 2. The NG tube likely terminates in the hiatal hernia, unchanged. Electronically Signed   By: Dorise Bullion III M.D   On: 09/27/2018 15:22   Dg Abd 2 Views  Result Date: 09/15/2018 CLINICAL DATA:  Left-sided abdominal pain, constipation. EXAM: ABDOMEN - 2 VIEW COMPARISON:  Radiographs of March 20, 2018. FINDINGS: Dilated small bowel loops are seen in the left side of the abdomen. Moderate amount of stool is noted in the colon. Surgical clips are seen in the left upper quadrant. Vascular calcifications  are noted. IMPRESSION: Dilated small bowel loops are seen in the left side of the abdomen concerning for distal small bowel obstruction. Continued radiographic follow-up is recommended. Moderate stool burden is noted. Electronically Signed   By: Marijo Conception, M.D.   On: 09/20/2018 10:42   Dg Abd Acute W/chest  Result Date: 09/28/2018 CLINICAL DATA:  Abdominal pain, distention EXAM: DG ABDOMEN ACUTE W/ 1V CHEST COMPARISON:  09/27/2018 FINDINGS: The NG tube loops in the dilated upper thoracic esophagus, stable. The tip is in the distal esophagus, also stable. Left base atelectasis. Heart is normal size. Continued small bowel dilatation with air-fluid levels compatible with small bowel obstruction. No visible free air. No significant change since prior study. IMPRESSION: Continued small bowel obstruction pattern. NG tube loops in the dilated thoracic esophagus with the tip in the distal esophagus. Left base atelectasis. Electronically Signed   By: Rolm Baptise M.D.   On: 09/28/2018 08:30   Dg Abd Portable 1v  Result Date: 2018-10-13 CLINICAL DATA:  Small-bowel obstruction EXAM: PORTABLE ABDOMEN - 1 VIEW COMPARISON:  09/29/2018 FINDINGS: Moderate small bowel dilatation appears unchanged from the prior study. Nondistended colon. Surgical clips in the epigastrium. Atherosclerotic aorta and iliac arteries. IMPRESSION: Moderate small-bowel obstruction, unchanged. Electronically Signed   By: Franchot Gallo M.D.   On: 2018-10-13 08:35   Dg Abd Portable 1v  Result Date: 09/29/2018 CLINICAL DATA:  Small bowel obstruction EXAM: PORTABLE ABDOMEN - 1 VIEW COMPARISON:  09/28/2018 FINDINGS: Dilated small bowel loops are again noted throughout the abdomen and pelvis, not significantly changed since prior study compatible with small bowel obstruction. No visible evidence of free air on the supine view. No organomegaly. IMPRESSION: Continued  small bowel obstruction pattern, stable. Electronically Signed   By: Rolm Baptise M.D.   On: 09/29/2018 08:07   Dg Abd Portable 1v-small Bowel Obstruction Protocol-initial, 8 Hr Delay  Result Date: 09/26/2018 CLINICAL DATA:  SBO, 8 hour delay EXAM: PORTABLE ABDOMEN - 1 VIEW COMPARISON:  09/22/2018 FINDINGS: Enteric tube overlies the right lower mediastinum, likely within the patient's hiatal hernia when correlating with CT. Multiple dilated loops of small bowel in the left mid abdomen. Contrast opacifies distal small bowel in the right lower quadrant, possibly extending to the cecum. Moderate stool in the right colon. Surgical clips in the upper abdomen. Degenerative changes of the lumbar spine. IMPRESSION: Multiple dilated loops of small bowel in the left mid abdomen, compatible with small bowel obstruction. Contrast opacifies distal small bowel in the right lower quadrant, possibly extending to the cecum. Electronically Signed   By: Julian Hy M.D.   On: 09/26/2018 02:50   Dg Abd Portable 1v-small Bowel Protocol-position Verification  Result Date: 09/22/2018 CLINICAL DATA:  Enteric tube placement, follow-up small-bowel obstruction EXAM: PORTABLE ABDOMEN - 1 VIEW COMPARISON:  09/05/2018 CT abdomen/pelvis FINDINGS: Enteric tube terminates in the right lower mediastinum. Diffuse prominent small bowel dilatation throughout the abdomen, not appreciably changed. Enteric contrast is noted within mid pelvic bowel loops. No evidence of pneumatosis or pneumoperitoneum. Surgical clips are noted in the upper abdomen. No radiopaque nephrolithiasis. IMPRESSION: 1. Enteric tube terminates in the right lower mediastinum, either within the lower thoracic esophagus or within the herniated portion of the stomach. 2. No appreciable change in diffuse prominent small bowel dilatation. Enteric contrast is noted within mid pelvic bowel loops, either within pelvic small bowel and/or distal colon (oral contrast was present within the distal colon and rectum on the CT study performed earlier today).  Findings suggest stable partial distal small bowel obstruction. Electronically Signed   By: Ilona Sorrel M.D.   On: 08/30/2018 22:55   [place] Ekg Site Rite  Result Date: 09/28/2018 If Site Rite image not attached, placement could not be confirmed due to current cardiac rhythm.   Microbiology Recent Results (from the past 240 hour(s))  Surgical pcr screen     Status: None   Collection Time: 06-Oct-2018  9:44 AM  Result Value Ref Range Status   MRSA, PCR NEGATIVE NEGATIVE Final   Staphylococcus aureus NEGATIVE NEGATIVE Final    Comment: (NOTE) The Xpert SA Assay (FDA approved for NASAL specimens in patients 53 years of age and older), is one component of a comprehensive surveillance program. It is not intended to diagnose infection nor to guide or monitor treatment. Performed at [facility], [place] [address], [place], [place] [address]     Lab Basic Metabolic Panel: Recent Labs  Lab 09/26/18 [phone] 09/27/18 0810 09/28/18 0811 09/28/18 1454 09/29/18 0646 10/06/2018 0439 2018/10/06 1609  NA 139 137 137  --  137 136 142  K 4.1 4.2 4.6  --  4.1 5.1 3.7  CL 101 102 103  --  105 106 113*  CO2 25 21* 20*  --  20* 17*  --   GLUCOSE 120* 106* 103*  --  101* 94 185*  BUN 87* 92* 86*  --  77* 65* 49*  CREATININE 2.36* 2.27* 2.06*  --  2.00* 1.67* 1.40*  CALCIUM 8.3* 8.2* 8.6*  --  8.3* 8.3*  --   MG  --   --   --   --  1.8  --   --  PHOS  --   --   --  4.2 4.0  --   --    Liver Function Tests: Recent Labs  Lab 09/04/2018 1131 09/29/18 0646  AST 43* 21  ALT 22 14  ALKPHOS 62 57  BILITOT 0.9 1.6*  PROT 7.0 5.8*  ALBUMIN 3.7 2.6*   Recent Labs  Lab 08/30/2018 1131  LIPASE 35   No results for input(s): AMMONIA in the last 168 hours. CBC: Recent Labs  Lab 09/10/2018 1131 09/25/18 1010 09/27/18 0810 09/29/18 0646 10/01/2018 0439 10-01-18 1609  WBC 10.6* 7.8 6.2 5.3 4.9  --   NEUTROABS  --   --   --  4.1  --   --   HGB 13.5 13.3 12.7* 12.1* 12.3* 10.2*  HCT 45.0 44.7 42.2  39.7 39.7 30.0*  MCV 90.7 90.7 90.0 90.0 88.4  --   PLT 159 164 128* 114* 129*  --    Cardiac Enzymes: Recent Labs  Lab 10/01/2018 1820  TROPONINI 0.10*   Sepsis Labs: Recent Labs  Lab 09/25/18 1010 09/27/18 0810 09/29/18 0646 10/01/2018 0439  WBC 7.8 6.2 5.3 4.9    Procedures/Operations  Preop diagnosis: Persistent small bowel obstruction Postop diagnosis: Closed loop small bowel obstruction Procedure performed: Exploratory laparotomy, extensive lysis of adhesions (greater than 120 minutes), small bowel resection with primary anastomosis, revision of gastrojejunal anastomosis, placement of wound VAC (25 cm)    Kristen D Scatliffe 10-01-18, 8:58 PM

## 2018-10-30 NOTE — Progress Notes (Addendum)
[facility] Progress Note     Subjective: CC-  Patient states that he got very full with only a few sips of liquids yesterday. No emesis. He reports slight increase in abdominal pain today. No flatus or BM yesterday or today.  Objective: Vital signs in last 24 hours: Temp:  [98 F (36.7 C)-98.6 F (37 C)] 98.6 F (37 C) (10/02 0455) Pulse Rate:  [71-82] 82 (10/02 0455) Resp:  [16-17] 17 (10/02 0455) BP: (146-151)/(80-91) 146/80 (10/02 0455) SpO2:  [99 %-100 %] 100 % (10/02 0455) Last BM Date: 09/28/18  Intake/Output from previous day: 10/01 0701 - 10/02 0700 In: 1844.5 [P.O.:30; I.V.:1714.5; IV Piggyback:100] Out: 450 [Urine:450] Intake/Output this shift: No intake/output data recorded.  PE: Gen: Alert, NAD, pleasant HEENT: EOM's intact, pupils equal and round Card: RRR Pulm: CTAB, no W/R/R, effort normal Abd: Soft, distended, mild left sided abdominal tenderness, +BS HYW:VPXTGG soft and nontender Psych: A&Ox3  Skin: no rashes noted, warm and dry  Lab Results:  Recent Labs    09/29/18 0646 07-Oct-2018 0439  WBC 5.3 4.9  HGB 12.1* 12.3*  HCT 39.7 39.7  PLT 114* 129*   BMET Recent Labs    09/29/18 0646 10-07-2018 0439  NA 137 136  K 4.1 5.1  CL 105 106  CO2 20* 17*  GLUCOSE 101* 94  BUN 77* 65*  CREATININE 2.00* 1.67*  CALCIUM 8.3* 8.3*   PT/INR No results for input(s): LABPROT, INR in the last 72 hours. CMP     Component Value Date/Time   NA 136 2018/10/07 0439   NA 143 09/02/2018 1533   K 5.1 2018/10/07 0439   CL 106 2018-10-07 0439   CO2 17 (L) 10/07/18 0439   CO2 104 09/27/2015   GLUCOSE 94 2018/10/07 0439   BUN 65 (H) 10/07/18 0439   BUN 79 (HH) 09/02/2018 1533   BUN 39 09/27/2015   CREATININE 1.67 (H) 10/07/18 0439   CREATININE 1.96 (H) 10/23/2016 0920   CALCIUM 8.3 (L) October 07, 2018 0439   CALCIUM 8.8 09/27/2015   PROT 5.8 (L) 09/29/2018 0646   ALBUMIN 2.6 (L) 09/29/2018 0646   ALBUMIN 3.6 09/27/2015   AST 21  09/29/2018 0646   ALT 14 09/29/2018 0646   ALKPHOS 57 09/29/2018 0646   BILITOT 1.6 (H) 09/29/2018 0646   GFRNONAA 35 (L) 10-07-18 0439   GFRNONAA 30 (L) 10/23/2016 0920   GFRAA 41 (L) 10-07-2018 0439   GFRAA 35 (L) 10/23/2016 0920   Lipase     Component Value Date/Time   LIPASE 35 09/21/2018 1131       Studies/Results: Dg Abd Portable 1v  Result Date: 09/29/2018 CLINICAL DATA:  Small bowel obstruction EXAM: PORTABLE ABDOMEN - 1 VIEW COMPARISON:  09/28/2018 FINDINGS: Dilated small bowel loops are again noted throughout the abdomen and pelvis, not significantly changed since prior study compatible with small bowel obstruction. No visible evidence of free air on the supine view. No organomegaly. IMPRESSION: Continued small bowel obstruction pattern, stable. Electronically Signed   By: Rolm Baptise M.D.   On: 09/29/2018 08:07   [place] Ekg Site Rite  Result Date: 09/28/2018 If Site Rite image not attached, placement could not be confirmed due to current cardiac rhythm.   Anti-infectives: Anti-infectives (From admission, onward)   None       Assessment/Plan HTN DM HLD Hypothyroidism CKD-III H/o DVT Glaucoma Deaf   SBO - CT scan 9/26 showed distal small bowel obstruction likely due to adhesion; large hiatal hernia - xray shows  persistent SBO and patient having no bowel function today  ID -none FEN -IVF, NPO VTE -SCDs Foley -none  Plan:SBO not improving with conservative management, recommend exploratory laparotomy. Keep NPO.  Needs NG tube replaced in the OR. Will see if anesthesia can place a central line in the OR for TPN access since the patient has been NPO for several days and will likely have an ileus postoperatively. Of note, patient states that he would not want a blood transfusion if recommended.   LOS: 6 days    Wellington [place] , [facility] Surgery 28-Oct-2018, 8:32 AM Pager: [phone] Mon 7:00 am -11:30 AM Tues-Fri 7:00 am-4:30  pm Sat-Sun 7:00 am-11:30 am

## 2018-10-30 NOTE — Progress Notes (Signed)
[facility] Progress Note Patient Name: Philip Matthews DOB: [birth year] MRN: [id number]   Date of Service  2018-10-11  HPI/Events of Note  Hypotension/Shock - BP = 49/35. Currently on Norepinephrine IV infusion at ceiling. Phenylephrine, Vasopressin and NaHCO3 infusions as well.   eICU Interventions  Will order: 1. ABG STAT. 2. NaHCO3 100 meq IV STAT. 3. Monitor CVP now and Q 4 hours.  4. Increase ceiling on Norepinephrine IV infusion to 70 mcg/min.  5. Ground Team notified of decompensation.         Sommer,Steven Eugene 11-Oct-2018, 8:22 PM

## 2018-10-30 NOTE — Progress Notes (Signed)
PHARMACY - ADULT TOTAL PARENTERAL NUTRITION CONSULT NOTE   Pharmacy Consult for TPN Indication: SBO  Patient Measurements: Weight: 56.7 kg Height 5'5" IBW: 61.5 kg  Assessment: 57 yom admitted 9/26 with persistent SBO likely due to adhesions from previous colon surgery. Per Surgery, not improving and will need to consider ex-lap in next few days. Surgery expects likely post-op ileus.  GI: Pre-albumin low at 8.8. CT abd - distal SBO, large hiatal hernia, scattered colonic diverticula. +small amount of flatus, LBM 9/30. Ex-lap for 10/2 - NGT to be replaced in OR. Bisacodyl. Pepcid IV Endo: T2DM hx (A1c 6.8%). Currently on sensitive SSI q4h. CBGs controlled Insulin requirements in the past 24 hours: 3 units Lytes: K trend up to 5.1, CO2 low - down to 17, others WNL Renal: CKDIV (baseline SCr 2-2.2) - SCr down to 1.67, CrCl~25, BUN down to 65, UOP 0.3 cc/kg/hr per documentation. +4.5L since admit. On 1/2NS at 75 ml/hr Pulm: RA Cards: BP slightly elevated - no meds currently Hepatobil: LFTs normalized, Tbili high at 1.6. TG wnl Heme: hx DVT, previously on warfarin, no AC currently pta. Dopplers neg for DVT 9/18. SCDs for VTE ppx  TPN Access: pending - Surgery asked for central line placement in OR today, will f/u TPN start date: 10/07/18 Nutritional Goals (per RD recommendation on 09/29/18): KCal: 1500 - 1700 Protein: 70 - 85 g Fluid: 1.5 - 1.7 L  Goal TPN rate:  65 ml/hr (provides 80g protein and  1557 kCal per day)  Current Nutrition:  NPO  Plan:  Initiate TPN at 63mL/hr. Titrate to goal as appropriate This TPN provides 43 g of protein, 134 g of dextrose, and 21 g of lipids which provides 838 kCals per day, meeting 61% of protein and 56% of patient kCal needs Electrolytes in TPN: standard except remove K; Cl:Ac ratio of max Ac Add MVI, trace elements, Pepcid 20mg  to TPN  Continue sensitive q4h SSI and adjust as needed Reduce IVMF 1/2NS to 40 ml/hr when TPN bag hung at  1800 Monitor TPN labs, Surgery plans  Elicia Lamp, PharmD, BCPS Clinical Pharmacist Clinical phone [phone] Please check AMION for all [place] contact numbers 10/07/18 9:55 AM

## 2018-10-30 NOTE — Anesthesia Procedure Notes (Signed)
Procedure Name: Intubation Date/Time: October 15, 2018 1:17 PM Performed by: Marsa Aris, CRNA Pre-anesthesia Checklist: Patient identified, Emergency Drugs available, Suction available and Patient being monitored Patient Re-evaluated:Patient Re-evaluated prior to induction Oxygen Delivery Method: Circle system utilized Preoxygenation: Pre-oxygenation with 100% oxygen Induction Type: IV induction, Rapid sequence and Cricoid Pressure applied Laryngoscope Size: 2 and Miller Grade View: Grade II Tube type: Oral Tube size: 7.5 mm Number of attempts: 1 Airway Equipment and Method: Stylet Placement Confirmation: ETT inserted through vocal cords under direct vision,  positive ETCO2 and breath sounds checked- equal and bilateral Secured at: 23 cm Tube secured with: Tape Dental Injury: Teeth and Oropharynx as per pre-operative assessment

## 2018-10-30 NOTE — Telephone Encounter (Signed)
Form completed and placed in fax box  

## 2018-10-30 NOTE — Anesthesia Postprocedure Evaluation (Signed)
Anesthesia Post Note  Patient: Philip Matthews  Procedure(s) Performed: EXPLORATORY LAPAROTOMY (N/A Abdomen) LYSIS OF ADHESIONS AND SMALL BOWEL RESECTION (N/A Abdomen)     Patient location during evaluation: SICU Anesthesia Type: General Level of consciousness: sedated Vital Signs Assessment: vitals unstable Respiratory status: patient remains intubated per anesthesia plan Cardiovascular status: unstable Anesthetic complications: no Comments:   Patient was transported to the ICU intubated as per our plan after discussion with surgeon. He was unstable at the time of transfer with apparent sepsis, requiring high dose levophed as well as vasopressin boluses and fluid boluses. We were unable to transfuse blood or albumin due to patient refusal for religious reasons.               Lidia Collum

## 2018-10-30 NOTE — Consult Note (Signed)
NAME:  Philip Matthews, MRN:  [id number], DOB:  Apr 13, 1928, LOS: 6 ADMISSION DATE:  09/12/2018, CONSULTATION DATE:  10-06-18 REFERRING MD:  Georgette Dover CHIEF COMPLAINT:  Post op vent management   Brief History   Philip Matthews is a 84 y.o. male who was admitted 9/26 with SBO that was attempted to be treated conservatively; however, this was unsuccessful.  Pt taken to OR for ex lap and repair on 10/2 then later returned to the ICU in shock.  Significant Hospital Events   9/26 > admit. 10/2 > to OR for ex lap with extensive lysis of adhesions, small bowel resection with primary anastomosis, revision of gastrojejunal anastomosis, placement of wound vac.  Returned to ICU in shock.  Consults: date of consult/date signed off & final recs:  General surgery 9/26 >  Nephrology 9/28 >   Procedures (surgical and bedside):  ETT 10/2 >  Left radial arterial line 10/2 >  R IJ CVL 10/2 >  Abdominal JP drain 10/2 >   Significant Diagnostic Tests:  CT A / P 9/26 > distal SBO, large hiatal hernia, small b/l pleural effusions, mild prostatic hypertrophy, scattered colonic diverticula. Echo 10/3 >   Micro Data: None.  Antimicrobials:  None.   Subjective:  Comfortable on vent.  In shock despite 50 levo, 0.04 vaso.  Objective   Blood pressure 115/80, pulse (!) 111, temperature (!) 94.5 F (34.7 C), temperature source Rectal, resp. rate 15, height 5\' 5"  (1.651 m), weight 56.7 kg, SpO2 100 %.    Vent Mode: PRVC FiO2 (%):  [100 %] 100 % Set Rate:  [15 bmp] 15 bmp Vt Set:  [490 mL] 490 mL PEEP:  [5 cmH20] 5 cmH20 Plateau Pressure:  [18 cmH20] 18 cmH20   Intake/Output Summary (Last 24 hours) at 10-06-18 1822 Last data filed at 10-06-2018 1745 Gross per 24 hour  Intake 3894.54 ml  Output 1585 ml  Net 2309.54 ml   Filed Weights   09/28/18 1400 06-Oct-2018 1124  Weight: 56.7 kg 56.7 kg    Examination: General: Elderly appearing male, cachectic, resting in bed, in NAD. Neuro: Sedated, not  responsive. HEENT: [place]/AT. Sclerae anicteric. ETT in place.. Cardiovascular: RRR, no M/R/G.  Lungs: Respirations even and unlabored.  CTA bilaterally, No W/R/R. Abdomen: Midline abdominal incision with wound vac in place, abdominal JP drain with bloody drainage.  Abdomen soft and non-distended.  Musculoskeletal: No gross deformities, no edema.  Skin: Intact, warm, no rashes.  Assessment & Plan:   Respiratory insufficiency - intubated for ex lap. - Continue full vent support. - Bronchial hygiene. - No SBT until shock has improved. - CXR in AM.  Sedation needs due to mechanical ventilation. - Fentanyl PRN / Midazolam PRN. - RASS goal: -1. - Daily WUA.  Shock - presumed septic but can not rule out component cardiogenic. - Continue levophed, vasopressin, neosynephrine for goal MAP > 65. - 500cc NS bolus now. - Assess cortisol and start empiric stress dose steroids. - Assess CVP, goal 10 - 12 for now. - No blood products given that pt is Jehovah's Witness. - Assess echo, troponins, lactate.  SBO - s/p ex lap with extensive lysis of adhesions, small bowel resection with primary anastomosis, revision of gastrojejunal anastomosis, placement of wound vac. - Post op care per surgery.  NAGMA. - 2 amps bicarb then start bicarb gtt. - Repeat ABG at 2200.  CKD. Hypocalcemia. Hx hyperkalemia - due to distal RTA. - Nephrology following. - 1g Ca gluconate. - Hold preadmission  lokelma.  UTI. - Start ceftriaxone.  DM. - SSI. - Hold preadmission victoza.  Hx hypothyroidism. - Continue SSI per IV formulation.  Hx HTN, HLD. - Hold preadmission hydralazine, lasix, pravastatin.  Anemia of chronic disease. Thrombocytopenia. - Transfuse for Hgb < 7. - No blood products given that pt is Jehovah's Witness. - Monitor platelet counts.  Moderate malnutrition. - Continue TPN for now and defer timing of enteral nutrition to surgery.  Hx severe glaucoma. - Continue preadmission  brimonidine - timolol, latanoprost.  Hx of portal vein thrombosis, DVT - previously on coumadin which was stopped March 2019. - Monitor clinically.   Disposition / Summary of Today's Plan 10-08-2018   Remain in ICU. Continue pressors, steroids, fluids for shock. Start bicarb gtt for acidosis.    Diet: NPO.  Nutrition via TPN. Pain/Anxiety/Delirium protocol (if indicated): Fentanyl PRN / Midazolam PRN. VAP protocol (if indicated): Yes. DVT prophylaxis: SCD's. GI prophylaxis: Famotidine. Hyperglycemia protocol: SSI. Mobility: Bedrest. Code Status: Full. Family Communication: None available.  Per notes, family is deaf and mute.  Labs   CBC: Recent Labs  Lab 09/10/2018 1131 09/25/18 1010 09/27/18 0810 09/29/18 0646 2018-10-08 0439 10/08/2018 1609  WBC 10.6* 7.8 6.2 5.3 4.9  --   NEUTROABS  --   --   --  4.1  --   --   HGB 13.5 13.3 12.7* 12.1* 12.3* 10.2*  HCT 45.0 44.7 42.2 39.7 39.7 30.0*  MCV 90.7 90.7 90.0 90.0 88.4  --   PLT 159 164 128* 114* 129*  --    Basic Metabolic Panel: Recent Labs  Lab 09/26/18 0513 09/27/18 0810 09/28/18 0811 09/28/18 1454 09/29/18 0646 2018/10/08 0439 Oct 08, 2018 1609  NA 139 137 137  --  137 136 142  K 4.1 4.2 4.6  --  4.1 5.1 3.7  CL 101 102 103  --  105 106 113*  CO2 25 21* 20*  --  20* 17*  --   GLUCOSE 120* 106* 103*  --  101* 94 185*  BUN 87* 92* 86*  --  77* 65* 49*  CREATININE 2.36* 2.27* 2.06*  --  2.00* 1.67* 1.40*  CALCIUM 8.3* 8.2* 8.6*  --  8.3* 8.3*  --   MG  --   --   --   --  1.8  --   --   PHOS  --   --   --  4.2 4.0  --   --    GFR: Estimated Creatinine Clearance: 29.8 mL/min (A) (by C-G formula based on SCr of 1.4 mg/dL (H)). Recent Labs  Lab 09/25/18 1010 09/27/18 0810 09/29/18 0646 10/08/2018 0439  WBC 7.8 6.2 5.3 4.9   Liver Function Tests: Recent Labs  Lab 09/09/2018 1131 09/29/18 0646  AST 43* 21  ALT 22 14  ALKPHOS 62 57  BILITOT 0.9 1.6*  PROT 7.0 5.8*  ALBUMIN 3.7 2.6*   Recent Labs  Lab  09/23/2018 1131  LIPASE 35   No results for input(s): AMMONIA in the last 168 hours. ABG    Component Value Date/Time   TCO2 17 (L) Oct 08, 2018 1609    Coagulation Profile: No results for input(s): INR, PROTIME in the last 168 hours. Cardiac Enzymes: No results for input(s): CKTOTAL, CKMB, CKMBINDEX, TROPONINI in the last 168 hours. HbA1C: HbA1c, POC (controlled diabetic range)  Date/Time Value Ref Range Status  07/21/2018 01:59 PM 8.9 (A) 0.0 - 7.0 % Final   Hgb A1c MFr Bld  Date/Time Value Ref Range Status  09/25/2018  10:10 AM 6.8 (H) 4.8 - 5.6 % Final    Comment:    (NOTE) Pre diabetes:          5.7%-6.4% Diabetes:              >6.4% Glycemic control for   <7.0% adults with diabetes   03/20/2018 09:31 AM 7.0 (H) 4.8 - 5.6 % Final    Comment:    (NOTE) Pre diabetes:          5.7%-6.4% Diabetes:              >6.4% Glycemic control for   <7.0% adults with diabetes    CBG: Recent Labs  Lab 09/29/18 1956 Oct 14, 2018 0020 2018/10/14 0451 10/14/2018 0830 10/14/18 1055  GLUCAP 86 87 95 96 111*    Admitting History of Present Illness.   Pt is encephelopathic; therefore, this HPI is obtained from chart review. Philip Matthews is a 84 y.o. male who has a PMH as outlined below and who presented to [facility] ED on 9/26 with 1 - 3 day hx of abd pain, constipation and abdominal distention.  He was found to have SBO.  General surgery was consulted and recommend conservative management.  This was attempted; however, was unsuccessful.  He was subsequently taken to the OR on 10/2 for ex lap with extensive lysis of adhesions, small bowel resection with primary anastomosis, revision of gastrojejunal anastomosis, placement of wound vac.  Post operatively, he returned to the ICU on the ventilator and was in shock requiring high dose levophed.  PCCM was consulted to assist with medical management.  Of note, pt is a Sales promotion account executive Witness who is against receiving any blood products regardless whether  they are for life saving measures or not.  Review of Systems:   Unable to obtain as pt is encephalopathic.  Past medical history  He,  has a past medical history of Anemia, Arthritis, ARTHRITIS, LEFT SHOULDER (02/01/2010), Chronic kidney disease (CKD), stage IV (severe) ([place]), Deaf, Diverticulosis, DIVERTICULOSIS OF COLON (03/08/2010), Esophagitis, Gout, History of DVT (deep vein thrombosis), Hyperlipidemia, Hypertension, Hypothyroidism, Kidney mass, Liver nodule, Portal vein thrombosis, PUD, HX OF (03/08/2010), and Type II diabetes mellitus ([place]).   Surgical History    Past Surgical History:  Procedure Laterality Date  . COLON SURGERY     "something was twisted; that messed w/my BM; had to have it untwisted"  . HERNIA REPAIR    . LIVER LOBECTOMY    . SHOULDER ARTHROCENTESIS Left      Social History   Social History   Socioeconomic History  . Marital status: Divorced    Spouse name: Not on file  . Number of children: Not on file  . Years of education: Not on file  . Highest education level: Not on file  Occupational History  . Not on file  Social Needs  . Financial resource strain: Not on file  . Food insecurity:    Worry: Not on file    Inability: Not on file  . Transportation needs:    Medical: Not on file    Non-medical: Not on file  Tobacco Use  . Smoking status: Former Smoker    Types: Cigarettes    Last attempt to quit: 01/18/2011    Years since quitting: 7.7  . Smokeless tobacco: Never Used  . Tobacco comment: 08/27/2017 "didn't smoke alot; can't tell you how long I smoked"  Substance and Sexual Activity  . Alcohol use: No  . Drug use: No  . Sexual  activity: Not on file  Lifestyle  . Physical activity:    Days per week: Not on file    Minutes per session: Not on file  . Stress: Not on file  Relationships  . Social connections:    Talks on phone: Not on file    Gets together: Not on file    Attends religious service: Not on file    Active member of club or  organization: Not on file    Attends meetings of clubs or organizations: Not on file    Relationship status: Not on file  . Intimate partner violence:    Fear of current or ex partner: Not on file    Emotionally abused: Not on file    Physically abused: Not on file    Forced sexual activity: Not on file  Other Topics Concern  . Not on file  Social History Narrative  . Not on file  ,  reports that he quit smoking about 7 years ago. His smoking use included cigarettes. He has never used smokeless tobacco. He reports that he does not drink alcohol or use drugs.   Family history   His family history includes Cancer in his father; Heart disease in his mother.   Allergies No Known Allergies  Home meds  Prior to Admission medications   Medication Sig Start Date End Date Taking? Authorizing Provider  acetaminophen (TYLENOL) 500 MG tablet Take 500 mg by mouth every 6 (six) hours as needed (pain).   Yes [provider]  allopurinol (ZYLOPRIM) 100 MG tablet take 1 tablet (100 mg) by mouth once daily 08/18/18  Yes Rumball, Alison, DO  brimonidine-timolol (COMBIGAN) 0.2-0.5 % ophthalmic solution Place 1 drop into both eyes every 12 (twelve) hours.   Yes [provider]  calcitRIOL (ROCALTROL) 0.25 MCG capsule Take 1 capsule (0.25 mcg total) by mouth every Monday, Wednesday, and Friday. 04/22/18  Yes Mayo, Pete Pelt, MD  calcium-vitamin D (OSCAL-500) 500-400 MG-UNIT tablet TAKE 1 TABLET BY MOUTH 2 TIMES DAILY Patient taking differently: Take 1 tablet by mouth 2 (two) times daily.  09/10/18  Yes Tammi Klippel, Sherin, DO  docusate sodium (COLACE) 100 MG capsule Take 1 capsule (100 mg total) by mouth 2 (two) times daily as needed (constipation). Over the counter 04/21/18  Yes Mayo, Pete Pelt, MD  epoetin alfa (PROCRIT) 57017 UNIT/ML injection Inject 1 mL (10,000 Units total) into the skin 3 (three) times a week. Patient taking differently: Inject 10,000 Units into the skin every 14 (fourteen)  days.  09/24/11  Yes Hulan Saas M, DO  FEROSUL 325 (65 Fe) MG tablet TAKE 1 TABLET BY MOUTH EVERY DAY Patient taking differently: Take 325 mg by mouth daily.  09/10/18  Yes Tammi Klippel, Sherin, DO  furosemide (LASIX) 20 MG tablet Please take 40mg  (2 tablets) in the morning and 20mg  at bedtime (1 tablet) for the next 5 days. Patient taking differently: Take 20-40 mg by mouth See admin instructions. Take 40mg  (2 tablets) in the morning and 20mg  (1 tablet) at bedtime. 09/17/18  Yes Rory Percy, DO  hydrALAZINE (APRESOLINE) 50 MG tablet take 1 tablet (50mg ) by mouth three times a day 08/18/18  Yes Rumball, Bryson Ha, DO  latanoprost (XALATAN) 0.005 % ophthalmic solution Place 1 drop into both eyes at bedtime.   Yes [provider]  levothyroxine (SYNTHROID, LEVOTHROID) 75 MCG tablet Take 1 tablet (75 mcg total) by mouth daily. 08/18/18  Yes Rory Percy, DO  Multiple Vitamin (GNP ESSENTIAL ONE DAILY) TABS TAKE 1  TABLET BY MOUTH EVERY DAY Patient taking differently: Take 1 tablet by mouth daily.  06/01/18  Yes Mayo, Pete Pelt, MD  pantoprazole (PROTONIX) 20 MG tablet TAKE 1 TABLET BY MOUTH 2 TIMES DAILY Patient taking differently: Take 20 mg by mouth 2 (two) times daily.  09/10/18  Yes Tammi Klippel, Sherin, DO  polyethylene glycol powder (GLYCOLAX/MIRALAX) powder Take 17 g by mouth 2 (two) times daily. Hold for loose stools Patient taking differently: Take 17 g by mouth 2 (two) times daily as needed for mild constipation. Hold for loose stools 04/21/18  Yes Mayo, Pete Pelt, MD  pravastatin (PRAVACHOL) 40 MG tablet take 1 tablet (40 mg) by mouth daily at bedtime 08/18/18  Yes Rumball, Alison, DO  sodium bicarbonate 650 MG tablet Take 1 tablet (650 mg total) by mouth 2 (two) times daily. 07/16/13  Yes Marin Olp, MD  sodium zirconium cyclosilicate (LOKELMA) 5 g packet Take 5 g by mouth daily. 09/01/18  Yes Wilber Oliphant, MD  vitamin B-12 (CYANOCOBALAMIN) 100 MCG tablet TAKE 1 TABLET BY MOUTH EVERY  DAY Patient taking differently: Take 100 mcg by mouth daily.  09/10/18  Yes Tammi Klippel, Sherin, DO  feeding supplement (BOOST / RESOURCE BREEZE) LIQD Take 1 Container by mouth 3 (three) times daily between meals. Patient not taking: Reported on 08/25/2018 08/29/17   Carlyle Dolly, MD  furosemide (LASIX) 40 MG tablet TAKE 1 TABLET BY MOUTH EVERY DAY Patient not taking: Reported on 09/23/2018 06/17/18   Mayo, Pete Pelt, MD  glucose blood (ONE TOUCH ULTRA TEST) test strip Check blood sugar three times daily 07/30/18   Rory Percy, DO  Lancets [facility] ULTRASOFT) lancets Check sugar up to 6 x daily 07/10/18   Rory Percy, DO  liraglutide (VICTOZA) 18 MG/3ML SOPN Inject 0.1 mLs (0.6 mg total) into the skin daily. 07/21/18 08/25/18  Rory Percy, DO  ONETOUCH DELICA LANCETS FINE MISC 1 each by Does not apply route every morning. 03/29/15   Olam Idler, MD    CC time: 40 min.   Montey Hora, [facility] Pager: [phone]  or [phone] 10/15/2018, 6:22 PM

## 2018-10-30 NOTE — Transfer of Care (Signed)
Immediate Anesthesia Transfer of Care Note  Patient: Philip Matthews  Procedure(s) Performed: EXPLORATORY LAPAROTOMY (N/A Abdomen) LYSIS OF ADHESIONS AND SMALL BOWEL RESECTION (N/A Abdomen)  Patient Location: ICU  Anesthesia Type:General  Level of Consciousness: sedated and Patient remains intubated per anesthesia plan  Airway & Oxygen Therapy: Patient remains intubated per anesthesia plan and Patient placed on Ventilator (see vital sign flow sheet for setting)  Post-op Assessment: Report given to RN and Post -op Vital signs reviewed and stable  Post vital signs: Reviewed and stable  Last Vitals:  Vitals Value Taken Time  BP 83/67 10-17-2018  5:22 PM  Temp    Pulse 111 10/17/2018  5:29 PM  Resp 15 17-Oct-2018  5:32 PM  SpO2 100 % 10/17/18  5:29 PM  Vitals shown include unvalidated device data.  Last Pain:  Vitals:   October 17, 2018 0455  TempSrc: Oral  PainSc:          Complications: No apparent anesthesia complications

## 2018-10-30 NOTE — Progress Notes (Signed)
[place] [facility]or service called. Family stated that patient was not an organ donor due to religious reasons. Family at this time does not know which funeral home they would like for patient to be sent. Family given patient placement's number and instructed to call when they do know funeral home information. Patient's belonging located on [address], family to take them home. Post Mortem care to be completed after family leaves bedside.   Margaret Pyle, RN

## 2018-10-30 NOTE — Progress Notes (Addendum)
Family Medicine Teaching Service Daily Progress Note Intern Pager: [phone]  Patient name: Philip Matthews Medical record number: [id number] Date of birth: 1928/10/08 Age: 84 y.o. Gender: male  Primary Care Provider: Rory Percy, DO Consultants: surgery,  Code Status: Full code  Pt Overview and Major Events to Date:  Admitted: 09/23/2018  Assessment and Plan: Philip Castrogiovanni Allenis a 84 y.o.malepresenting with a bowel obstruction. PMH is significant forT2DM, DVT, HTN,peripheral neuropathy, hyperlipidemia, gout, anemia, CKD, and bilateral deafness.  #SBO, Stable Repeat XR yesterday still shows some dilated SB.  Surgery allowed sips of clear yesterday.  NG tube removed by patient yesterday and not replaced.  Surgery taking for ex lap today as no improvement with conservative management. -Surgery following, appreciate recs -Exploratory Laparotomy today -Replace NG tube in OR - place central line for TPN in OR - IV zofran PRN, tylenol PRN pain -Ambulate with PT  #Asymptomatic bacteruria, stable Remains asymptomatic - will not treat at this time  #Deafness - needs ASL interpreter  #DM-II takes victoza 0.6mg  qd at home. A1c 6.8% on 9/27. CBG this AM 95. 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 Home lasix 40mg /20mg . Calcitriol 0.69mcg MWF, sodium Bicarb 650 qd. Nephro following.  Creatinine 2.27> 2.00>1.67. K this AM 5.1. BUN 80>77>65. - avoid nephrotoxic medications - nephro consulted - monitor BMPs - holding medications while NPO  #HTN: chronic BP this AM 146/80, with only mildly elevated BP overnight, not requiring prn hydral.  takes hydralazine 50mg  TID, lasix. Pharmacy states could do PRN hydralazine q4 for severely elevated pressures. If needs lasix IV, use half dose. - monitor vitals.  - hold meds while NPO  #Hx of hyperkalemia Distal RTA. Takes lokelma 5mg  daily. K this AM 5.1.  - BMPs - holding while npo - nephro aware and  managing  #anemia of chronic disease: Chronic Basleine ~8-10.  Hgb this AM 12.3. Using procrit 10k unites q2w and ferosul 325mg  daily. - cont CBC monitoring - nephro aware pt is due for procrit and is managing   #GERD: Chronic, stable Was on protonix 20mg  BID at home - cont IV pepcid.   # gout: Chronic No signs of acute Gouty attack.  takes allopurinol 100mg  qd at home. - hold while npo  #hypothyroidism: Chronic takes 28mcg daily at home. Last TSH in august.  -cont 1/2 dose IV while NPO, per pharm recs  #hyperlipidemia - pravastatin 40mg  at home - hold while NPO  #primary open angle glaucoma: Chronic, severe  OS severe/OD mild. Taking brimonidine-timolol 1 drop BID, latanoprost 1 drop at bedtime.  - continue home meds  #leukocytosis: resolved Wbc on admission 10.6, this AM 4.9.  - monitor CBC  #hx of portal vein thrombosis and DVT- was taking coumadin until march 2019. No signs of thrombosis on physical exam this admission.  - monitor for DVT - lovenox ppx 30mg    Electrolytes: replete PRN  Nutrition: NPO GI ppx: IV pepcid DVT px: lovenox 30mg   Disposition: home pending clinical improvement  Subjective:  **PATIENT NOT SEEN AS GOING TO OR**  Objective: Temp:  [98 F (36.7 C)-98.6 F (37 C)] 98.6 F (37 C) (10/02 0455) Pulse Rate:  [71-82] 82 (10/02 0455) Resp:  [16-17] 17 (10/02 0455) BP: (146-151)/(80-91) 146/80 (10/02 0455) SpO2:  [99 %-100 %] 100 % (10/02 0455)  **PATIENT NOT EXAMINED AS WAS GOING TO OR** Will examine post-surgery    Laboratory: Recent Labs  Lab 09/27/18 0810 09/29/18 0646 2018/10/28 0439  WBC 6.2 5.3  4.9  HGB 12.7* 12.1* 12.3*  HCT 42.2 39.7 39.7  PLT 128* 114* 129*   Recent Labs  Lab 09/03/2018 1131  09/28/18 0811 09/29/18 0646 10-25-2018 0439  NA 139   < > 137 137 136  K 5.0   < > 4.6 4.1 5.1  CL 99   < > 103 105 106  CO2 26   < > 20* 20* 17*  BUN 72*   < > 86* 77* 65*  CREATININE 2.25*   < > 2.06* 2.00*  1.67*  CALCIUM 8.7*   < > 8.6* 8.3* 8.3*  PROT 7.0  --   --  5.8*  --   BILITOT 0.9  --   --  1.6*  --   ALKPHOS 62  --   --  57  --   ALT 22  --   --  14  --   AST 43*  --   --  21  --   GLUCOSE 215*   < > 103* 101* 94   < > = values in this interval not displayed.   Imaging/Diagnostic Tests: Dg Abd Portable 1v  Result Date: Oct 25, 2018 CLINICAL DATA:  Small-bowel obstruction EXAM: PORTABLE ABDOMEN - 1 VIEW COMPARISON:  09/29/2018 FINDINGS: Moderate small bowel dilatation appears unchanged from the prior study. Nondistended colon. Surgical clips in the epigastrium. Atherosclerotic aorta and iliac arteries. IMPRESSION: Moderate small-bowel obstruction, unchanged. Electronically Signed   By: Franchot Gallo M.D.   On: 2018-10-25 08:35     [place], Bernita Raisin, DO October 25, 2018, 9:52 AM PGY-1, [place] Intern pager: [phone], text pages welcome

## 2018-10-30 NOTE — Progress Notes (Signed)
Post Mortem care complete. All tubes, lines and equipment removed. Patient transported down to morgue.

## 2018-10-30 NOTE — Progress Notes (Addendum)
The patient seems and evaluated this morning. The interpreter was not available at the time. I communicated with him by writing. He stated that he feels the same. He has not moved his bowel again since yesterday. No change in his level of pain.  Exam: Gen: Calm in bed, not in distress. Appears cachectic. HEENT: EOMI. PERRLA.\ Neuro: Awake and alert. Heart: S1 S2 normal, no murmur. RRR. Lungs: CTA B/L. Abd: Distended, tinkling rushing bowel sound. ++ tenderness especially at the RUQ. Ext: No, edema.  A/P:  Small Bowel Obstruction. NGT out. Chest Xray reviewed without any improvement. Surgery to see today for a possible exploratory lab. Reassess after the procedure.  Mild to moderate malnutrition. He appears cachectic with weight loss. Body mass index is 20.8 kg/m. He has not eaten for more than one week. Nutrition consult recommended. He will benefit from TPN at this point. I will defer to Surgery.  ESRD per nephrology.  See the resident's note for other chronic problems. I will cosign note once it is done.

## 2018-10-30 NOTE — Progress Notes (Signed)
Nutrition Follow-up  DOCUMENTATION CODES:   Not applicable  INTERVENTION:   -TPN management per pharmacy -RD will follow for diet advancement and supplement as appropriate  NUTRITION DIAGNOSIS:   Inadequate oral intake related to altered GI function as evidenced by NPO status.  Ongoing  GOAL:   Patient will meet greater than or equal to 90% of their needs  Progressing  MONITOR:   PO intake, Supplement acceptance, Diet advancement, Labs, Weight trends, Skin, I & O's  REASON FOR ASSESSMENT:   Consult New TPN/TNA  ASSESSMENT:   Philip Matthews is a 84 y.o. male presenting with a bowel obstruction. PMH is significant for T2DM, DVT, HTN, peripheral neuropathy, hyperlipidemia, gout, anemia, CKD, and bilateral deafness.  9/26- NGT inserted 10/1- pt pulled NGT and refused re-insertion  Reviewed I/O's: +1.3 L x 24 hours and +4.5 L since admission  RD re-consulted for TPN initiation.   Pt out of room at time of visit (on OR for ex lap). Per chart review, pt complained of fullness with sips of clears. Plan to take pt or OR due to failed conservative management. NGT and central line to be placed in OR today per chart review.   Per pharmacy notes, plan to initiate TPN at 55mL/hr and titrate to goal as appropriate. TPN to provide 838 kcals and 43 grams protein, which meets 56% of estimated kcal needs and 61% of estimated protein needs.   Labs reviewed: CBGS: 95-111 (inpatient orders for glycemic control are 0-9 units insulin aspart every 4 hours).   Diet Order:   Diet Order            Diet NPO time specified  Diet effective midnight              EDUCATION NEEDS:   No education needs have been identified at this time  Skin:  Skin Assessment: Reviewed RN Assessment  Last BM:  09/28/18  Height:   Ht Readings from Last 1 Encounters:  2018/10/21 5\' 5"  (1.651 m)    Weight:   Wt Readings from Last 1 Encounters:  Oct 21, 2018 56.7 kg    Ideal Body Weight:  61.8  kg  BMI:  Body mass index is 20.8 kg/m.  Estimated Nutritional Needs:   Kcal:  1500-1700  Protein:  70-85 grams  Fluid:  1.5-1.7 L    Mitchelle Sultan A. Jimmye Norman, RD, LDN, CDE Pager: [phone] After hours Pager: [phone]

## 2018-10-30 NOTE — Significant Event (Signed)
..     NAME:  Philip Matthews, MRN:  [id number], DOB:  [birth year], LOS: 6  Called to bedside:  Called to bedside by E-link pt is hypotensive despite multiple pressors, mechanical ventilation, bicarb ggt. No waveform on A line tracing but has a weak thready palpable carotid pulse.    Family Discussion  With the assistance of ASL translator I was able to communicate with the daughter and the family.  RN and NP witnessed conversation: I explained that despite our current clinical interventions he is continuing to decline and will most likely have a cardiac arrest. When I asked if he was a full code and explained that that included compressions and ACLS medications. The daughter expressed that they did not want him to receive compressions. His resuscitation status was converted to DNR.   I asked if they wanted to continue the current interventions understanding that his prognosis is poor. They expressed a desire to make him comfortable. I expressed what is entailed in withdrawal and comfort care and they understood and wanted to proceed. However when I walked in to the room pt had no pulse at 20:36.   I stated to the family that he has already lost his pulse. At that time all medications were discontinued.at 20:36 And the endotracheal tube was removed by RT at 20:40 He continued to have PEA on the monitor. Pronounced when electrical activity ceased.  Time of Death: 2107/04/14    Signed Dr Seward [facility]

## 2018-10-30 NOTE — Procedures (Signed)
Extubation Procedure Note  Patient Details:   Name: Philip Matthews DOB: [birth year] MRN: [id number]   Airway Documentation:    Vent end date: 25-Oct-2018 Vent end time: 2041   Evaluation  O2 sats: terminal extubation Complications: No apparent complications Patient did tolerate procedure well. Bilateral Breath Sounds: Clear, Diminished   No  Pt family wished to make pt comfort care. RT terminally extubated pt per CCM MD order.   Roby Lofts Jemiah [place] 2018-10-25, 10:12 PM

## 2018-10-30 NOTE — Procedures (Signed)
Arterial Catheter Insertion Procedure Note Philip Matthews [id number] 1928-10-06  Procedure: Insertion of Arterial Catheter  Indications: Blood pressure monitoring and Frequent blood sampling  Procedure Details Consent: Unable to obtain consent because of emergent medical necessity. Time Out: Verified patient identification, verified procedure, site/side was marked, verified correct patient position, special equipment/implants available, medications/allergies/relevent history reviewed, required imaging and test results available.  Performed  Maximum sterile technique was used including antiseptics, cap, gloves, gown, hand hygiene, mask and sheet. Skin prep: Chlorhexidine; local anesthetic administered 22 gauge catheter was inserted into left radial artery using the Seldinger technique. ULTRASOUND GUIDANCE USED: NO Evaluation Blood flow good; BP tracing good. Complications: No apparent complications.   Ciro Backer 10/13/2018

## 2018-10-30 NOTE — Anesthesia Procedure Notes (Signed)
Central Venous Catheter Insertion Performed by: Lidia Collum, MD, anesthesiologist Start/EndOct 26, 2019 1:30 PM, 10-24-18 1:29 PM Patient location: Pre-op. Preanesthetic checklist: patient identified, IV checked, site marked, risks and benefits discussed, surgical consent, monitors and equipment checked, pre-op evaluation, timeout performed and anesthesia consent Position: Trendelenburg Hand hygiene performed , maximum sterile barriers used  and Seldinger technique used Catheter size: 8 Fr Total catheter length 16. Central line was placed.Double lumen Procedure performed using ultrasound guided technique. Ultrasound Notes:anatomy identified, needle tip was noted to be adjacent to the nerve/plexus identified, no ultrasound evidence of intravascular and/or intraneural injection and image(s) printed for medical record Attempts: 1 Following insertion, dressing applied, line sutured and Biopatch. Post procedure assessment: blood return through all ports, free fluid flow and no air  Patient tolerated the procedure well with no immediate complications.

## 2018-10-30 NOTE — Plan of Care (Signed)
  Problem: Education: Goal: Knowledge of General Education information will improve Description: Including pain rating scale, medication(s)/side effects and non-pharmacologic comfort measures Outcome: Progressing   Problem: Coping: Goal: Level of anxiety will decrease Outcome: Progressing   

## 2018-10-30 DEATH — deceased

## 2019-05-31 DEATH — deceased
